# Patient Record
Sex: Male | Born: 1940 | Race: White | Hispanic: No | Marital: Married | Smoking: Former smoker
Health system: Southern US, Community
[De-identification: ages and names within clinical notes are randomized; demographics above are authoritative.]

## PROBLEM LIST (undated history)

## (undated) DIAGNOSIS — T8203XA Leakage of heart valve prosthesis, initial encounter: Secondary | ICD-10-CM

## (undated) DIAGNOSIS — Z992 Dependence on renal dialysis: Secondary | ICD-10-CM

## (undated) DIAGNOSIS — Z952 Presence of prosthetic heart valve: Secondary | ICD-10-CM

## (undated) DIAGNOSIS — R001 Bradycardia, unspecified: Secondary | ICD-10-CM

## (undated) DIAGNOSIS — I4729 Other ventricular tachycardia: Secondary | ICD-10-CM

## (undated) DIAGNOSIS — I5189 Other ill-defined heart diseases: Secondary | ICD-10-CM

## (undated) DIAGNOSIS — N186 End stage renal disease: Secondary | ICD-10-CM

## (undated) DIAGNOSIS — I499 Cardiac arrhythmia, unspecified: Secondary | ICD-10-CM

## (undated) DIAGNOSIS — K08109 Complete loss of teeth, unspecified cause, unspecified class: Secondary | ICD-10-CM

## (undated) DIAGNOSIS — I10 Essential (primary) hypertension: Secondary | ICD-10-CM

## (undated) DIAGNOSIS — I472 Ventricular tachycardia: Secondary | ICD-10-CM

## (undated) DIAGNOSIS — I2510 Atherosclerotic heart disease of native coronary artery without angina pectoris: Secondary | ICD-10-CM

## (undated) DIAGNOSIS — R011 Cardiac murmur, unspecified: Secondary | ICD-10-CM

## (undated) DIAGNOSIS — R233 Spontaneous ecchymoses: Secondary | ICD-10-CM

## (undated) DIAGNOSIS — J189 Pneumonia, unspecified organism: Secondary | ICD-10-CM

## (undated) DIAGNOSIS — I509 Heart failure, unspecified: Secondary | ICD-10-CM

## (undated) DIAGNOSIS — K529 Noninfective gastroenteritis and colitis, unspecified: Secondary | ICD-10-CM

## (undated) DIAGNOSIS — Z974 Presence of external hearing-aid: Secondary | ICD-10-CM

## (undated) DIAGNOSIS — N185 Chronic kidney disease, stage 5: Secondary | ICD-10-CM

## (undated) DIAGNOSIS — IMO0001 Reserved for inherently not codable concepts without codable children: Secondary | ICD-10-CM

## (undated) DIAGNOSIS — I770 Arteriovenous fistula, acquired: Secondary | ICD-10-CM

## (undated) DIAGNOSIS — C679 Malignant neoplasm of bladder, unspecified: Secondary | ICD-10-CM

## (undated) DIAGNOSIS — I350 Nonrheumatic aortic (valve) stenosis: Secondary | ICD-10-CM

## (undated) DIAGNOSIS — N2589 Other disorders resulting from impaired renal tubular function: Secondary | ICD-10-CM

## (undated) DIAGNOSIS — M1990 Unspecified osteoarthritis, unspecified site: Secondary | ICD-10-CM

## (undated) DIAGNOSIS — Z789 Other specified health status: Secondary | ICD-10-CM

## (undated) DIAGNOSIS — I951 Orthostatic hypotension: Secondary | ICD-10-CM

## (undated) DIAGNOSIS — I739 Peripheral vascular disease, unspecified: Secondary | ICD-10-CM

## (undated) DIAGNOSIS — Z972 Presence of dental prosthetic device (complete) (partial): Secondary | ICD-10-CM

## (undated) DIAGNOSIS — I4819 Other persistent atrial fibrillation: Secondary | ICD-10-CM

## (undated) DIAGNOSIS — Z7901 Long term (current) use of anticoagulants: Secondary | ICD-10-CM

## (undated) DIAGNOSIS — R238 Other skin changes: Secondary | ICD-10-CM

## (undated) DIAGNOSIS — I429 Cardiomyopathy, unspecified: Secondary | ICD-10-CM

## (undated) HISTORY — DX: Bradycardia, unspecified: R00.1

## (undated) HISTORY — DX: Essential (primary) hypertension: I10

## (undated) HISTORY — PX: VASCULAR SURGERY: SHX849

## (undated) HISTORY — PX: OTHER SURGICAL HISTORY: SHX169

## (undated) HISTORY — DX: Arteriovenous fistula, acquired: I77.0

## (undated) HISTORY — PX: COLONOSCOPY: SHX174

## (undated) HISTORY — PX: MULTIPLE TOOTH EXTRACTIONS: SHX2053

## (undated) HISTORY — DX: Hypomagnesemia: E83.42

## (undated) HISTORY — DX: Orthostatic hypotension: I95.1

## (undated) HISTORY — DX: Peripheral vascular disease, unspecified: I73.9

## (undated) HISTORY — DX: Other ventricular tachycardia: I47.29

## (undated) HISTORY — DX: Malignant neoplasm of bladder, unspecified: C67.9

## (undated) HISTORY — DX: Leakage of heart valve prosthesis, initial encounter: T82.03XA

## (undated) HISTORY — DX: Other ill-defined heart diseases: I51.89

## (undated) HISTORY — DX: Long term (current) use of anticoagulants: Z79.01

## (undated) HISTORY — PX: CATARACT EXTRACTION W/ INTRAOCULAR LENS  IMPLANT, BILATERAL: SHX1307

## (undated) HISTORY — DX: Ventricular tachycardia: I47.2

## (undated) HISTORY — DX: Chronic kidney disease, stage 5: N18.5

## (undated) HISTORY — PX: CHOLECYSTECTOMY: SHX55

## (undated) HISTORY — DX: Cardiomyopathy, unspecified: I42.9

## (undated) HISTORY — DX: Other disorders resulting from impaired renal tubular function: N25.89

## (undated) HISTORY — DX: Nonrheumatic aortic (valve) stenosis: I35.0

## (undated) HISTORY — PX: EYE SURGERY: SHX253

---

## 1997-12-22 ENCOUNTER — Other Ambulatory Visit: Admission: RE | Admit: 1997-12-22 | Discharge: 1997-12-22 | Payer: Self-pay | Admitting: Urology

## 1999-02-21 ENCOUNTER — Ambulatory Visit (HOSPITAL_COMMUNITY): Admission: RE | Admit: 1999-02-21 | Discharge: 1999-02-21 | Payer: Self-pay | Admitting: Urology

## 1999-05-27 ENCOUNTER — Encounter (INDEPENDENT_AMBULATORY_CARE_PROVIDER_SITE_OTHER): Payer: Self-pay | Admitting: *Deleted

## 1999-05-27 ENCOUNTER — Ambulatory Visit (HOSPITAL_BASED_OUTPATIENT_CLINIC_OR_DEPARTMENT_OTHER): Admission: RE | Admit: 1999-05-27 | Discharge: 1999-05-27 | Payer: Self-pay | Admitting: Urology

## 1999-11-01 ENCOUNTER — Encounter: Payer: Self-pay | Admitting: Urology

## 1999-11-01 ENCOUNTER — Encounter: Admission: RE | Admit: 1999-11-01 | Discharge: 1999-11-01 | Payer: Self-pay | Admitting: Urology

## 1999-11-25 ENCOUNTER — Ambulatory Visit (HOSPITAL_BASED_OUTPATIENT_CLINIC_OR_DEPARTMENT_OTHER): Admission: RE | Admit: 1999-11-25 | Discharge: 1999-11-25 | Payer: Self-pay | Admitting: Urology

## 1999-11-25 ENCOUNTER — Encounter (INDEPENDENT_AMBULATORY_CARE_PROVIDER_SITE_OTHER): Payer: Self-pay | Admitting: Specialist

## 2000-04-26 ENCOUNTER — Encounter: Payer: Self-pay | Admitting: Urology

## 2000-04-26 ENCOUNTER — Encounter: Admission: RE | Admit: 2000-04-26 | Discharge: 2000-04-26 | Payer: Self-pay | Admitting: Urology

## 2000-05-01 HISTORY — PX: BLADDER SURGERY: SHX569

## 2000-05-01 HISTORY — PX: CYSTECTOMY: SUR359

## 2001-03-01 HISTORY — PX: COLON SURGERY: SHX602

## 2003-04-23 ENCOUNTER — Ambulatory Visit (HOSPITAL_COMMUNITY): Admission: RE | Admit: 2003-04-23 | Discharge: 2003-04-23 | Payer: Self-pay | Admitting: Cardiology

## 2005-04-20 ENCOUNTER — Encounter: Admission: RE | Admit: 2005-04-20 | Discharge: 2005-04-20 | Payer: Self-pay | Admitting: Cardiology

## 2005-05-11 ENCOUNTER — Encounter: Admission: RE | Admit: 2005-05-11 | Discharge: 2005-05-11 | Payer: Self-pay | Admitting: Cardiology

## 2005-05-22 ENCOUNTER — Ambulatory Visit (HOSPITAL_COMMUNITY): Admission: RE | Admit: 2005-05-22 | Discharge: 2005-05-22 | Payer: Self-pay | Admitting: Cardiology

## 2005-10-05 ENCOUNTER — Encounter: Admission: RE | Admit: 2005-10-05 | Discharge: 2005-10-05 | Payer: Self-pay | Admitting: Family Medicine

## 2005-10-06 ENCOUNTER — Inpatient Hospital Stay (HOSPITAL_COMMUNITY): Admission: EM | Admit: 2005-10-06 | Discharge: 2005-10-12 | Payer: Self-pay | Admitting: Emergency Medicine

## 2005-10-07 ENCOUNTER — Encounter (INDEPENDENT_AMBULATORY_CARE_PROVIDER_SITE_OTHER): Payer: Self-pay | Admitting: *Deleted

## 2005-10-09 ENCOUNTER — Encounter (INDEPENDENT_AMBULATORY_CARE_PROVIDER_SITE_OTHER): Payer: Self-pay | Admitting: Specialist

## 2005-10-09 ENCOUNTER — Ambulatory Visit: Payer: Self-pay | Admitting: Internal Medicine

## 2005-10-19 ENCOUNTER — Ambulatory Visit (HOSPITAL_COMMUNITY): Admission: RE | Admit: 2005-10-19 | Discharge: 2005-10-19 | Payer: Self-pay | Admitting: Gastroenterology

## 2005-11-22 ENCOUNTER — Ambulatory Visit: Payer: Self-pay | Admitting: Gastroenterology

## 2005-11-30 ENCOUNTER — Ambulatory Visit (HOSPITAL_COMMUNITY): Admission: RE | Admit: 2005-11-30 | Discharge: 2005-11-30 | Payer: Self-pay | Admitting: Gastroenterology

## 2005-12-04 ENCOUNTER — Ambulatory Visit: Payer: Self-pay | Admitting: Gastroenterology

## 2006-05-24 ENCOUNTER — Ambulatory Visit (HOSPITAL_COMMUNITY): Admission: RE | Admit: 2006-05-24 | Discharge: 2006-05-24 | Payer: Self-pay | Admitting: Cardiology

## 2006-07-02 ENCOUNTER — Ambulatory Visit: Payer: Self-pay | Admitting: Internal Medicine

## 2006-07-02 ENCOUNTER — Inpatient Hospital Stay (HOSPITAL_COMMUNITY): Admission: AD | Admit: 2006-07-02 | Discharge: 2006-07-06 | Payer: Self-pay | Admitting: Cardiology

## 2006-09-17 ENCOUNTER — Ambulatory Visit (HOSPITAL_COMMUNITY): Admission: RE | Admit: 2006-09-17 | Discharge: 2006-09-17 | Payer: Self-pay | Admitting: Cardiology

## 2006-09-17 ENCOUNTER — Encounter (INDEPENDENT_AMBULATORY_CARE_PROVIDER_SITE_OTHER): Payer: Self-pay | Admitting: Cardiology

## 2006-10-08 ENCOUNTER — Inpatient Hospital Stay (HOSPITAL_COMMUNITY): Admission: AD | Admit: 2006-10-08 | Discharge: 2006-10-10 | Payer: Self-pay | Admitting: Cardiology

## 2006-10-26 ENCOUNTER — Ambulatory Visit (HOSPITAL_COMMUNITY): Admission: RE | Admit: 2006-10-26 | Discharge: 2006-10-26 | Payer: Self-pay | Admitting: Cardiology

## 2006-11-19 ENCOUNTER — Ambulatory Visit (HOSPITAL_COMMUNITY): Admission: RE | Admit: 2006-11-19 | Discharge: 2006-11-19 | Payer: Self-pay | Admitting: Cardiology

## 2006-12-12 ENCOUNTER — Inpatient Hospital Stay (HOSPITAL_COMMUNITY): Admission: AD | Admit: 2006-12-12 | Discharge: 2006-12-14 | Payer: Self-pay | Admitting: Cardiology

## 2007-03-22 ENCOUNTER — Ambulatory Visit: Payer: Self-pay | Admitting: Vascular Surgery

## 2007-03-22 ENCOUNTER — Ambulatory Visit (HOSPITAL_COMMUNITY): Admission: RE | Admit: 2007-03-22 | Discharge: 2007-03-22 | Payer: Self-pay | Admitting: Cardiology

## 2007-05-07 ENCOUNTER — Encounter: Payer: Self-pay | Admitting: Critical Care Medicine

## 2008-01-02 ENCOUNTER — Encounter: Payer: Self-pay | Admitting: Critical Care Medicine

## 2008-01-13 ENCOUNTER — Ambulatory Visit (HOSPITAL_COMMUNITY): Admission: RE | Admit: 2008-01-13 | Discharge: 2008-01-13 | Payer: Self-pay | Admitting: Cardiology

## 2008-02-04 DIAGNOSIS — I4891 Unspecified atrial fibrillation: Secondary | ICD-10-CM

## 2008-02-04 DIAGNOSIS — I421 Obstructive hypertrophic cardiomyopathy: Secondary | ICD-10-CM

## 2008-02-04 DIAGNOSIS — I10 Essential (primary) hypertension: Secondary | ICD-10-CM | POA: Insufficient documentation

## 2008-02-04 DIAGNOSIS — C679 Malignant neoplasm of bladder, unspecified: Secondary | ICD-10-CM | POA: Insufficient documentation

## 2008-02-05 ENCOUNTER — Ambulatory Visit (HOSPITAL_BASED_OUTPATIENT_CLINIC_OR_DEPARTMENT_OTHER): Admission: RE | Admit: 2008-02-05 | Discharge: 2008-02-05 | Payer: Self-pay | Admitting: Critical Care Medicine

## 2008-02-05 ENCOUNTER — Ambulatory Visit: Payer: Self-pay | Admitting: Critical Care Medicine

## 2008-02-05 DIAGNOSIS — R942 Abnormal results of pulmonary function studies: Secondary | ICD-10-CM

## 2009-02-04 ENCOUNTER — Ambulatory Visit (HOSPITAL_COMMUNITY): Admission: RE | Admit: 2009-02-04 | Discharge: 2009-02-04 | Payer: Self-pay | Admitting: Cardiology

## 2009-08-18 ENCOUNTER — Ambulatory Visit (HOSPITAL_COMMUNITY)
Admission: RE | Admit: 2009-08-18 | Discharge: 2009-08-18 | Payer: Self-pay | Source: Home / Self Care | Admitting: Cardiology

## 2010-05-21 ENCOUNTER — Encounter: Payer: Self-pay | Admitting: Cardiology

## 2010-05-22 ENCOUNTER — Encounter: Payer: Self-pay | Admitting: Gastroenterology

## 2010-09-13 NOTE — Discharge Summary (Signed)
NAME:  LEELAND, LOVELADY NO.:  [id number]   MEDICAL RECORD NO.:  [id number]          PATIENT TYPE:  INP   LOCATION:  3729                         FACILITY:  [facility]   PHYSICIAN:  Guy Franco, P.A.       DATE OF BIRTH:  09-08-1940   DATE OF ADMISSION:  12/12/2006  DATE OF DISCHARGE:  12/14/2006                               DISCHARGE SUMMARY   DISCHARGE DIAGNOSES:  1. Atrial fibrillation, amiodarone load.  2. Long-term Coumadin use with therapeutic INR.  3. History of bladder cancer.  4. Renal insufficiency.  5. Hypertension.  6. Left ventricular hypertrophy.   HOSPITAL COURSE:  Mr. Watchman is a 70 year old male patient who was  admitted to [facility] for amiodarone load.  He had previously been  sotalol, but this was not keeping him in normal sinus rhythm.  He was  admitted for amiodarone load on December 12, 2006.  QTC on admission was  529 milliseconds, and QTC by discharge was essentially unchanged.   During his hospitalization, PFTs were performed and showed minimal  obstructive airway disease of the peripheral airways.  After a  bronchodilator administration, there was no significant response.  The  reduced diffusing opacity indicated a moderate loss of function of  alveolar capillary surface.   DISCHARGE MEDICATIONS:  The patient was discharged to home in stable and  improved condition on the following medications:  1. Amiodarone 400 mg p.o. b.i.d. for 1 week, then 400 mg daily for 1      week, then 200 mg daily thereafter.  2. Vitamins daily.  3. Coumadin 5 mg 1 tablet daily.  4. Norvasc 10 mg a day.   DIET:  Renal, low-fat, low-sodium, heart healthy diet.   ACTIVITY:  Increase activity slowly.   FOLLOWUP:  Follow up in Dr. Norris Cross office for an EKG and lab work on  December 18, 2006 at 10:30 a.m., and then to see her on December 25, 2006 at  12:45 p.m.   LABORATORY EVALUATION:  Please note that the patient's labs during the  hospital stay include:   White count of 7.2, hemoglobin 14.2, hematocrit  of 41.6, platelets 244.  Sodium 138, potassium 4.2, BUN 35, creatinine  1.75.  TSH 0.764.  Protime 22.5, INR of 1.9.      Guy Franco, P.A.     LB/MEDQ  D:  12/14/2006  T:  12/14/2006  Job:  [id number]   cc:   Armanda Magic, M.D.  Quita Skye Artis Flock, M.D.

## 2010-09-13 NOTE — H&P (Signed)
NAME:  [name], [name]                ACCOUNT NO.:  [id number]   MEDICAL RECORD NO.:  [id number]          PATIENT TYPE:  AMB   LOCATION:  ENDO                         FACILITY:  [facility]   PHYSICIAN:  Armanda Magic, M.D.     DATE OF BIRTH:  1940-08-04   DATE OF ADMISSION:  09/17/2006  DATE OF DISCHARGE:                              HISTORY & PHYSICAL   REASON FOR ADMISSION:  TEE.   HISTORY OF PRESENT ILLNESS:  [name] comes in today for a TEE.  Recent atrial fibrillation resulted in a TEE that showed a probable left  atrial thrombus.  He has been on systemic anticoagulation, followed at  our Coumadin clinic and has been therapeutic for at least seven weeks on  Coumadin.  He is extremely symptomatic with his underlying atrial  fibrillation resulting in fatigue.  He is scheduled for repeat TEE today  to evaluate the resolution of the left atrial thrombus prior to  cardioversion/sotalol load.   PAST MEDICAL HISTORY:  AFib; systemic anticoagulation; exertional  fatigue, secondary to underlying AFib; mild aortic stenosis;  hypertension; mild renal insufficiency with no evidence of renal artery  stenosis by MRI/MRA; gallstones, status post cholecystectomy; history of  bladder cancer, status post bladder resection; history of NSVT with AV  consult on July 03, 2006, seen by Dr. Lewayne Bunting with recommendations  of sotalol as well as ultimate AICD; recent adenosine Cardiolite with no  evidence of inducible ischemia.   MEDICATIONS:  Current medications are:  1. Multivitamin daily.  2. Vitamin B daily.  3. Vitamin D daily.  4. Vitamin E daily.  5. Metoprolol 25 mg b.i.d.  6. Coumadin.  7. Glucosamine 1500 mg a day.  8. Norvasc 5 mg a day.   FAMILY HISTORY:  His father had sudden death at age 30 and had had a  pacemaker in place.  Mother died of a stroke.   SOCIAL HISTORY:  He is a Technical sales engineer.  Denies any tobacco,  alcohol or illicit drug use.   ALLERGIES:  ASPIRIN CAUSES  GI BLEED; IBUPROFEN WITH GI BLEED.   PHYSICAL EXAMINATION:  VITAL SIGNS:  Pulse of 96.  O2 saturation is 94%  on room air.  Respirations 16.  Blood pressure 137/878.  HEENT:  Grossly normal.  NECK:  No carotid bruits.  RESPIRATORY:  Clear to auscultation bilaterally.  CARDIOVASCULAR:  Heart irregular, no murmurs, rubs or gallops.  ABDOMEN:  Obese, but nontender.  EXTREMITIES:  No peripheral edema.   ASSESSMENT/PLAN:  1. Atrial fibrillation:  Now for transesophageal echocardiogram to      evaluate left atrial appendage.  Patient will need sotalol load.      There is a question of automatic implantable cardioverter      defibrillator in the near future, secondary to probable      hypertrophic cardiomyopathy with father having a history of sudden      death.  2. Coumadin therapy.  3. Mild aortic stenosis.  4. Fatigue, secondary to atrial fibrillation.  5. Hypertension.  6. Mild renal insufficiency with no evidence of renal artery stenosis  by MRI/MRA.  7. History of bladder cancer, status post resection.  8. History of non-sustained ventricular tachycardia with an      electrophysiological evaluation as mentioned above.  9. Adenosine Cardiolite with no ischemia.  10.Long-term medication use.   The patient was seen and examined by Dr. Mayford Knife.  The patient will have  a TEE today to evaluate for left atrial clot and once that has been  determined as negative, we will go ahead and start the sotalol and  consider the defibrillator implantation.      Guy Franco, P.A.      Armanda Magic, M.D.  Electronically Signed    LB/MEDQ  D:  09/17/2006  T:  09/17/2006  Job:  [id number]

## 2010-09-13 NOTE — Op Note (Signed)
NAME:  CARMERON, HEADY NO.:  [id number]   MEDICAL RECORD NO.:  [id number]          PATIENT TYPE:  OIB   LOCATION:  2899                         FACILITY:  [facility]   PHYSICIAN:  Armanda Magic, M.D.     DATE OF BIRTH:  09-10-1940   DATE OF PROCEDURE:  03/22/2007  DATE OF DISCHARGE:                               OPERATIVE REPORT   REFERRING PHYSICIAN:  Quita Skye. Artis Flock, M.D.   PROCEDURE:  Direct current cardioversion.   SURGEON:  Armanda Magic, M.D.   INDICATIONS:  Atrial fibrillation.   COMPLICATIONS:  None.   IV MEDICATION:  200 mg of Pentothal IV.   This is a 70 year old male with a history of atrial fibrillation now  status post amiodarone load who now presents for cardioversion.   The patient was brought to the day hospital in the fasting nonsedated  state.  Informed consent was obtained.  The patient was connected to  continuous heart rate and pulse oximetry monitoring and intermittent  blood pressure monitor.  Defibrillator pads were placed on the anterior  left chest and posterior left back.  After adequate anesthesia was  obtained, a 150-joule synchronized biphasic shock was delivered which  successfully converted the patient to sinus rhythm.   The patient tolerated the procedure well, and after he was fully awake  and recovered, he was discharged to home.   ASSESSMENT:  1. Atrial fibrillation on amiodarone with a stable QTC.  2. Systemic anticoagulation with therapeutic INR.  3. Successful cardioversion to normal sinus rhythm.   PLAN:  Continue current medications.  Discharge to home when fully  awake.  Follow up with me in 2 weeks.      Armanda Magic, M.D.  Electronically Signed    TT/MEDQ  D:  03/22/2007  T:  03/22/2007  Job:  [id number]   cc:   Quita Skye. Artis Flock, M.D.

## 2010-09-13 NOTE — Op Note (Signed)
NAME:  [name], [name]                ACCOUNT NO.:  [id number]   MEDICAL RECORD NO.:  [id number]          PATIENT TYPE:  OIB   LOCATION:  2854                         FACILITY:  [facility]   PHYSICIAN:  Armanda Magic, M.D.     DATE OF BIRTH:  May 07, 1940   DATE OF PROCEDURE:  11/19/2006  DATE OF DISCHARGE:                               OPERATIVE REPORT   REFERRING PHYSICIAN:  Quita Skye. Artis Flock, M.D.   PROCEDURE:  Direct current cardioversion.   OPERATOR:  Armanda Magic, M.D.   INDICATIONS:  Atrial fibrillation currently on sotalol status post  cardioversion and sinus rhythm, now reverted back to atrial  fibrillation.   COMPLICATIONS:  None.   INTRAVENOUS MEDICINES:  Pentothal 150 mg IV.   This is a very pleasant 70 year old white male with a history of  hypertrophic obstructive cardiomyopathy, mild aortic stenosis,  paroxysmal atrial fibrillation now status post sotalol load.  He had  eventually converted by cardioversion to sinus rhythm but now referred  back to atrial fibrillation.  He now presents for recurrent  cardioversion.   The patient is brought to the day hospital in the fasting nonsedated  state.  Informed consent was obtained.  The patient was connected to  continuous heart rate and pulse oximetry monitoring and intermittent  blood pressure monitor.  Defibrillator pads were placed on the anterior  and posterior left chest.  After adequate anesthesia was obtained, a 100-  joule biphasic synchronized shock was delivered which was unsuccessful  in converting the patient to sinus rhythm.  A second biphasic 100-joule  synchronized shock was delivered which successfully converted the  patient to sinus bradycardia.  The patient tolerated the procedure well  without complications.  Once the patient was fully awake, he was  discharged to home.   ASSESSMENT:  1. Atrial fibrillation status post initial loading with sotalol, then      status post cardioversion, now back to atrial  fibrillation.  2. Systemic anticoagulation with therapeutic INR.  3. Successful cardioversion to normal sinus rhythm, sinus brady.   PLAN:  Discharge to home after fully awake.  Follow up with me in 2-3  weeks.      Armanda Magic, M.D.  Electronically Signed     TT/MEDQ  D:  11/19/2006  T:  11/19/2006  Job:  [id number]   cc:   Quita Skye. Artis Flock, M.D.

## 2010-09-13 NOTE — Op Note (Signed)
NAME:  [name], [name] NO.:  [id number]   MEDICAL RECORD NO.:  [id number]          PATIENT TYPE:  OIB   LOCATION:  2899                         FACILITY:  [facility]   PHYSICIAN:  Armanda Magic, M.D.     DATE OF BIRTH:  [birth year]   DATE OF PROCEDURE:  10/26/2006  DATE OF DISCHARGE:                               OPERATIVE REPORT   PROCEDURE:  Direct current cardioversion.   OPERATOR:  Armanda Magic, M.D.   INDICATIONS:  Atrial fibrillation.   COMPLICATIONS:  None.   MEDICATIONS:  130 mg IV Diprivan.   This is a 70 year old male who has a history of atrial fibrillation and  mild hypertrophic obstructive cardiomyopathy, who now presents for  cardioversion status post loading with sotalol.  He has had a  therapeutic INR for greater than 4 weeks and actually underwent a  transesophageal echo a few weeks ago, which showed no evidence of a  clot.  He now presents for cardioversion.   After adequate anesthesia was obtained, a 100-joule synchronized  biphasic shock was delivered. which was unsuccessful in converting the  patient to sinus rhythm.  A 150-joule synchronized biphasic shock was  then delivered, which successfully converted the patient to sinus  rhythm.  The patient tolerated the procedure well.  After the patient  was fully awake, the patient was discharged to home.   ASSESSMENT:  1. Atrial fibrillation status post sotalol load.  2. Successful cardioversion to normal sinus rhythm.  3. Systemic anticoagulation with therapeutic INR.   PLAN:  Discharge to home when fully awake.  EKG check in 1 week.  Follow  up with me in 2 weeks.      Armanda Magic, M.D.  Electronically Signed     TT/MEDQ  D:  10/26/2006  T:  10/26/2006  Job:  [id number]   cc:   Quita Skye. Artis Flock, M.D.

## 2010-09-13 NOTE — Discharge Summary (Signed)
NAME:  [name], [name]                ACCOUNT NO.:  [id number]   MEDICAL RECORD NO.:  [id number]          PATIENT TYPE:  INP   LOCATION:  3736                         FACILITY:  [facility]   PHYSICIAN:  Armanda Magic, M.D.     DATE OF BIRTH:  Dec 24, 1940   DATE OF ADMISSION:  10/08/2006  DATE OF DISCHARGE:  10/10/2006                               DISCHARGE SUMMARY   ADDENDUM   FOLLOWUP APPOINTMENTS:  1. [facility]iology Coumadin Clinic appointment for PT and INR check      on June 13 at 8:45 a.m. followed by an EKG also at [facility]      to check QTc on June 13 at 9 a.m.  2. EKG at [facility] Cardiology to check QTc on June 18 at 10:30 a.m.  3. Followup appointment with Dr. Carolanne Grumbling on June 24 at 11:30      a.m..  The patient is scheduled to call [phone] if he is unable to      make the scheduled appointments.      [name], [place]      Armanda Magic, M.D.  Electronically Signed    RDM/MEDQ  D:  10/10/2006  T:  10/11/2006  Job:  [id number]

## 2010-09-13 NOTE — H&P (Signed)
NAME:  [name], [name]                ACCOUNT NO.:  [id number]   MEDICAL RECORD NO.:  [id number]          PATIENT TYPE:  OIB   LOCATION:  2854                         FACILITY:  [facility]   PHYSICIAN:  Armanda Magic, M.D.     DATE OF BIRTH:  October 28, 1940   DATE OF ADMISSION:  11/19/2006  DATE OF DISCHARGE:                              HISTORY & PHYSICAL   REASON FOR VISIT:  Cardioversion, atrial fibrillation.   Mr. Bogden is a 70 year old male patient with a history of paroxysmal  atrial fibrillation status post sotalol loading.  He is admitted today  for a cardioversion while on sotalol.   He denies any chest pain, palpitations, PND, orthopnea, dizziness, or  syncope.  No recent flu, chills, or other constitutional symptoms.   PAST MEDICAL HISTORY:  1. PAF.  2. Systemic anticoagulation.  3. Asymptomatic bradycardia.  4. Hypertrophic obstructive cardiomyopathy.  5. Hypertension.  6. Renal insufficiency.  7. Bladder cancer, status post resection.   MEDICATIONS:  Multivitamin daily, vitamin B daily, vitamin D daily,  vitamin E daily, Coumadin, glucosamine, Norvasc 10 mg a day, sotalol 80  mg once a day (for renal __________ ).   FAMILY HISTORY:  His dad had a pacemaker.   SOCIAL HISTORY:  Married.   CURRENT ALLERGIES:  NO KNOWN DRUG ALLERGIES.   PHYSICAL EXAMINATION:  VITAL SIGNS:  Temperature 96.8, pulse 88,  respirations 18, blood pressure 132/87, O2 saturations 94% on room air.  HEENT:  Grossly normal.  No JVD.  Sclerae clear.  Conjunctivae normal.  CHEST:  Clear to auscultation bilaterally.  No wheeze or rhonchi.  HEART:  Irregularly irregular.  No gross murmur.  ABDOMEN:  Obese.  EXTREMITIES:  No peripheral edema.  SKIN:  Warm and dry.   ASSESSMENT/PLAN:  1. Atrial fibrillation, paroxysmal.  He is currently in atrial      fibrillation.  He will go back for cardioversion today.  I have      documentation of his INRs over the past several weeks.  INR on November 09, 2006  was 3.  INR on October 23, 2006 was 2.6.  INR on October 12, 2006 was 2.9.  On October 04, 2006, INR was 3.3.  2. Sotalol medication.  3. Systemic anticoagulation.  4. History of asymptomatic bradycardia.  5. Hypertrophic obstructive cardiomyopathy.  6. Mild hypertension.  7. Renal insufficiency with subsequent sotalol decreased to 80 mg      daily.  8. History of bladder cancer, status post resection.   The patient has been seen and examined.  The patient has been seen by  Dr. Mayford Knife.  Today the patient will have a cardioversion.  Lab work  pending.      Guy Franco, P.A.      Armanda Magic, M.D.  Electronically Signed    LB/MEDQ  D:  11/19/2006  T:  11/19/2006  Job:  [id number]   cc:   Quita Skye. Artis Flock, M.D.  Armanda Magic, M.D.

## 2010-09-13 NOTE — Discharge Summary (Signed)
NAME:  [name], [name]                ACCOUNT NO.:  [id number]   MEDICAL RECORD NO.:  [id number]          PATIENT TYPE:  INP   LOCATION:  3736                         FACILITY:  [facility]   PHYSICIAN:  Armanda Magic, M.D.     DATE OF BIRTH:  [birth year]   DATE OF ADMISSION:  10/08/2006  DATE OF DISCHARGE:  10/10/2006                               DISCHARGE SUMMARY   ADMISSION DIAGNOSIS:  Atrial fibrillation.   DISCHARGE DIAGNOSIS:  1. Paroxysmal atrial fibrillation, rate controlled, status post      sotalol loading with stable.  QTC.  2. Status post DCCV to normal sinus rhythm in February 2008, with      reversion to paroxysmal atrial fibrillation.  3. Occasional shortness of breath.  4. Asymptomatic bradycardia.  5. Hypertrophic obstructive cardiomyopathy.  6. Mild aortic stenosis.  7. Hypertension.  8. Systemic anticoagulation with Coumadin therapy.  9. Mild renal insufficiency without evidence of renal artery stenosis      by MRI/MRA.  10.Gallstones, status post cholecystectomy.  11.Bladder cancer, status post bladder resection.  12.Systemic anticoagulation with Coumadin therapy with a therapeutic      INR.   CONSULTATIONS:  None.   PROCEDURES:  None.   HOSPITAL COURSE:  [name] is a 70 year old Caucasian male with no  known history of coronary artery disease.  He has a history of  paroxysmal atrial fibrillation, status post DCCV in February 2008  converting to sinus rhythm, then reverting back to paroxysmal atrial  fibrillation.  He is on systemic anticoagulation with Coumadin therapy  and was admitted to the [facility] on October 08, 2006 for sotalol  loading at 120 mg twice daily.  Initial EKG upon arrival revealed atrial  fibrillation with a ventricular rate of 89 beats per minute with a QTC  of 457 msec.  There were no acute ischemic changes noted.  The patient  tolerated the drug well and a QTC remained stable during this admission.  Also during this admission  the patient experienced renal insufficiency  with an increased creatinine of 1.6, as a result sotalol was decreased  to 80 mg one tablet daily upon discharge.  A 12-lead EKG prior to  discharge on October 10, 2006 revealed atrial fibrillation with a  ventricular rate of 85 beats per minute with a QTC of 492 msec.  There  were no acute ischemic changes noted on that EKG.  The patient was seen  and examined by Dr. Mayford Knife prior to discharge who was in agreement with  the discharge decision.  The patient's metoprolol was discontinued  during this admission and upon discharge.  He is being discharged to  home in stable condition without complaints of palpitations, chest pain,  dizziness, fatigue, shortness of breath, presyncope, or syncope.   LABORATORY DATA:  White blood count 7, hemoglobin 13.8, hematocrit 40.4,  platelets 234,000.  PT 25.5, INR 2.2.  Sodium 139, potassium 4, chloride  107, CO2 23, glucose 105, BUN 28, creatinine 1.64.   X-rays:  None during this admission.   The 12-lead EKGs as stated in the hospital course.  CONDITION ON DISCHARGE:  Stable.   DISCHARGE MEDICATIONS:  1. Sotalol 80 mg one tablet daily.  Prescriptions were provided for      #30 one tablet by mouth daily with 11 refills, as well as #90 one      tablet by mouth daily with three refills.  2. Norvasc 10 mg daily.  3. Coumadin 2.5 mg daily alternated with 5 mg Tuesdays and Thursdays.  4. Vitamin B complex daily.  5. Vitamin E 400 international units daily.  6. Multivitamin one tablet daily.  7. Glucosamine plus MSM 1500 mg daily.  8. Calcium plus vitamin D daily.   DISCHARGE INSTRUCTIONS:  1. Increase activity slowly.  2. Continue a low-sodium, low-fat, and low cholesterol diet.  3. Stop any activity that causes chest pain, shortness of breath,      dizziness, sweating, or excessive weakness.   FOLLOW-UP APPOINTMENTS:  1. [facility] cardiology Coumadin Clinic on June 13 at 10 a.m.  2. EKG at [facility]  cardiology with a QTC check on June 18 at 10:30 a.m.  3. Follow-up appointment with Dr. Armanda Magic at [facility] Cardiology on      June 24 at 11:30 a.m..  The patient is scheduled to call [phone]      if he is unable to make the scheduled appointment.      [name], [place]      Armanda Magic, M.D.  Electronically Signed    RDM/MEDQ  D:  10/10/2006  T:  10/11/2006  Job:  [id number]   cc:   Armanda Magic, M.D.  Quita Skye Artis Flock, M.D.

## 2010-09-13 NOTE — H&P (Signed)
NAME:  [name], [name]                ACCOUNT NO.:  [id number]   MEDICAL RECORD NO.:  [id number]          PATIENT TYPE:  INP   LOCATION:  3736                         FACILITY:  [facility]   PHYSICIAN:  Armanda Magic, M.D.     DATE OF BIRTH:  1940/07/25   DATE OF ADMISSION:  10/08/2006  DATE OF DISCHARGE:                              HISTORY & PHYSICAL   PRIMARY CARE PHYSICIAN:  Quita Skye. Artis Flock, M.D.   CARDIOLOGIST:  Armanda Magic, M.D.   REASON FOR ADMISSION:  Atrial fibrillation.   HISTORY OF PRESENT ILLNESS:  [name] is a 70 year old Caucasian male  with no known history of coronary artery disease.  He has a history of  paroxysmal atrial fibrillation, status post DCCV in February 2008,  converting to sinus rhythm, then reverting back to paroxysmal atrial  fibrillation.  He is on systemic anticoagulation with Coumadin therapy.  He was admitted to the [facility] in March 2008, for sotalol  loading at 120 mg twice daily, which was later discontinued secondary to  a TEE which revealed a possible left atrial appendage.  Then, he was  started on Norvasc 5 mg daily which was later increased to 10 mg daily.  A repeat TEE revealed no left atrial appendage or thrombus.  He is  currently being directly readmitted for sotalol loading.  On today he admits to occasional palpitations with associated shortness  of breath.  He denies chest pain, dizziness, near syncope, or syncope.   PAST MEDICAL HISTORY:  1. Paroxysmal atrial fibrillation, rate controlled.  2. Status post DCCV to normal sinus rhythm in February 2008, now back      in paroxysmal atrial fibrillation.  3. Occasional shortness of breath.  4. Asymptomatic bradycardia.  5. Hypertrophic obstructive cardiomyopathy.  6. Mild aortic stenosis.  7. Hypertension.  8. Systemic anticoagulation with Coumadin therapy.  9. Mild renal insufficiency without evidence of renal artery stenosis      by MRI/MRA.  10.Gallstones, status post  cholecystectomy.  11.Bladder cancer, status post bladder resection.   ALLERGIES:  1. ASPIRIN.  2. IBUPROFEN.   CURRENT MEDICATIONS:  1. Coumadin as directed.  2. Norvasc 10 mg daily.  3. Multivitamin 1 tablet daily.  4. Glucosamine 1500 mg daily.  5. Vitamin B complex.  6. Calcium plus vitamin D once daily.  7. Vitamin E 400 international units daily.  8. Metoprolol 25 mg twice daily.   FAMILY HISTORY:  Father deceased age 35, status post permanent  pacemaker.  Mother deceased, CVA.   SOCIAL HISTORY:  Married with adult children.  Lives with his wife.  Self-employed as a Technical sales engineer.  He denies tobacco, alcohol,  or illicit drug use.   REVIEW OF SYSTEMS:  All other systems reviewed are negative other than  what is stated in the HPI.   PHYSICAL EXAMINATION:  GENERAL:  A 70 year old male, pleasant and  cooperative, NAD.  VITAL SIGNS:  Blood pressure 126/83, pulse 89, temperature 97.6, O2  saturation is 95% over room air, respirations 20.  HEENT:  Unremarkable.  NECK:  Supple without JVD or bilateral  carotid bruits.  Carotid  upstrokes 2+.  PULMONARY:  Breath sounds are equal and clear to auscultation  bilaterally.  No use of accessory muscles.  CV:  Irregularly irregular.  Normal S1-S2 with a 1/6 systolic murmur.  ABDOMEN:  Benign.  EXTREMITIES:  No peripheral edema, cyanosis, or clubbing.  DP pulses  2+/2, bilaterally.  SKIN:  Warm and dry without rashes or lesions.  NEUROLOGIC:  No focal motor or sensory deficits.  PSYCHIATRIC:  Normal mood and affect.   LABORATORY DATA:  Pending.   ASSESSMENT:  1. Paroxysmal atrial fibrillation, rate controlled.  2. Status post DCCV to normal sinus rhythm in February 2008, now back      in paroxysmal atrial fibrillation.  3. Recent TEE without left atrial appendage or thrombus.  4. Hypertrophic obstructive cardiomyopathy.  5. History of asymptomatic bradycardia.  6. Hypertension, controlled.  7. Otherwise as stated in  the past medical history.   PLAN:  1. Start sotalol 120 mg twice daily.  Hold for a pulse greater than or      equal to 3.0 seconds, a QTC greater than or equal to 550      milliseconds, and a heart rate less than 50.  2. Stat EKG.  3. Hold metoprolol 25 mg twice daily.  4. Coumadin 2.5 mg daily, except for 5 mg on Tuesdays and Thursdays,      otherwise Coumadin management and PT and INR check per pharmacy      protocol.  5. Continue Norvasc 10 mg daily as well as multivitamin 1 tablet      daily.  6. Daily EKG.  7. B-MET, CBC, PT, INR, PTT.  8. The patient was seen and examined by Dr. Mayford Knife who participated in      the medical decision making and plan of care.      [name], [place]      Armanda Magic, M.D.  Electronically Signed    RDM/MEDQ  D:  10/08/2006  T:  10/08/2006  Job:  [id number]   cc:   Quita Skye. Artis Flock, M.D.

## 2010-09-16 NOTE — H&P (Signed)
NAME:  [name], [name] NO.:  [id number]   MEDICAL RECORD NO.:  [id number]          PATIENT TYPE:  INP   LOCATION:  2032                         FACILITY:  [facility]   PHYSICIAN:  Armanda Magic, M.D.     DATE OF BIRTH:  1940/12/13   DATE OF ADMISSION:  07/02/2006  DATE OF DISCHARGE:                              HISTORY & PHYSICAL   PRIMARY CARE PHYSICIAN:  Quita Skye. Artis Flock, M.D.   CARDIOLOGIST:  Armanda Magic, M.D.   REASON FOR ADMISSION:  Atrial fibrillation.   HISTORY OF PRESENT ILLNESS:  Mr. Mierzwa is a 70 year old white male  with no known history of coronary artery disease.  He has a history of  paroxysmal atrial fibrillation and is status post DCCV with conversion  to a normal sinus rhythm approximately 3-4 weeks ago, shortly after  reverting back to atrial fibrillation.  He is rate controlled on  metoprolol twice daily.  He is also on systemic anticoagulation with  Coumadin therapy secondary to paroxysmal atrial fibrillation.  He was  admitted yesterday to the [facility] for sotalol loading.  He  admits to frequent palpitations, occasional shortness of breath, and  fatigue, however denies chest pain, dizziness, presyncope, or syncope.   PAST MEDICAL HISTORY:  1. Paroxysmal atrial fibrillation, rate controlled.  2. Status post DCCV to normal sinus rhythm in February 2008, now back      in atrial fibrillation.  3. Occasional shortness of breath.  4. Asymptomatic bradycardia.  5. Hypertrophic obstructive cardiomyopathy.  6. Mild aortic stenosis.  7. Hypertension.  8. Systemic anticoagulation with Coumadin therapy.  9. Mild renal insufficiency, without evidence of renal artery stenosis      by MRI/MRA.  10.Gallstones, status post cholecystectomy.  11.Bladder cancer, status post bladder resection.   ALLERGIES:  Intolerant to ASPIRIN and IBUPROFEN.   CURRENT MEDICATIONS:  1. Metoprolol 25 mg twice daily.  2. Coumadin as directed.   FAMILY  HISTORY:  Father deceased, age 63, status post permanent  pacemaker.  Mother deceased, CVA.   SOCIAL HISTORY:  Married, with adult children.  Lives with wife.  He is  self-employed as a Technical sales engineer.  He denies tobacco, alcohol,  or illicit drug use.   REVIEW OF SYSTEMS:  All other systems are negative, other than what is  stated in the HPI.   PHYSICAL EXAMINATION:  GENERAL:  A 70 year old male, pleasant and  cooperative, NAD.  VITAL SIGNS:  Blood pressure 154/94, pulse 72 and regular, respirations  20, temperature 97.4, O2 saturations 97% on room air.  HEENT:  Benign.  NECK:  Supple, without JVD or bilateral carotid bruits.  Carotid  upstrokes 2+.  PULMONARY:  Breath sounds are equal and clear to auscultation  bilaterally.  No use of accessory muscles.  CARDIOVASCULAR:  Irregularly irregular.  ABDOMEN:  Benign.  EXTREMITIES:  No peripheral edema.  DP pulses 2+/2 bilaterally.  SKIN:  Warm and dry, without rashes or lesions.  NEUROLOGIC:  No focal motor or sensory deficits.  PSYCHIATRIC:  Normal mood and affect.   LABORATORY DATA:  A 12-lead EKG, July 02, 2006:  Atrial fibrillation at  70 beats per minute, with an incomplete right bundle branch block, with  nonspecific T wave abnormalities, with T wave inversion in leads V3-V6.  There is no evidence of ischemia.  QTc 432 msec.   ASSESSMENT:  1. Paroxysmal atrial fibrillation, rate controlled.  2. Status post direct current cardioversion to normal sinus rhythm,      February 2008, now back in atrial fibrillation.  3. Hypertension.  4. Systemic anticoagulation with Coumadin therapy.  5. Hypertrophic obstructive cardiomyopathy.  6. Occasional shortness of breath.  7. Mild renal insufficiency.  8. Asymptomatic bradycardia.  9. Otherwise as stated in the past medical history.   PLAN:  1,  Begin sotalol loading per orders specified by Dr. Armanda Magic.  1. Continue home medications.  2. Please notify M.D. for heart  rate less than 50 or pauses greater      than or equal to 3 seconds.  3. Avoid caffeine.  4. Daily EKG to monitor QTc.  5. Check CBC, BMET, PT, INR, PTT, and TSH.  6. Further measures per Dr. Armanda Magic.  7. Dr. Mayford Knife to see, interview, and examine the patient.     [name], [place]      Armanda Magic, M.D.  Electronically Signed   RDM/MEDQ  D:  07/03/2006  T:  07/03/2006  Job:  [id number]   cc:   Quita Skye. Artis Flock, M.D.

## 2010-09-16 NOTE — Op Note (Signed)
NAME:  [name], [name] NO.:  [id number]   MEDICAL RECORD NO.:  [id number]          PATIENT TYPE:  INP   LOCATION:  5508                         FACILITY:  [facility]   PHYSICIAN:  Anselm Pancoast. Weatherly, M.D.DATE OF BIRTH:  1940-07-19   DATE OF PROCEDURE:  10/09/2005  DATE OF DISCHARGE:                                 OPERATIVE REPORT   PREOPERATIVE DIAGNOSES:  1.  Subacute cholecystitis.  2.  Recent past history of common duct stone, status post endoscopic      retrograde cholangiopancreatography and stent placement.   POSTOPERATIVE DIAGNOSES:  1.  Subacute cholecystitis with stones.  2.  Common duct stone.   PROCEDURE:  Laparoscopic cholecystectomy with cholangiogram.   ANESTHESIA:  General anesthesia.   SURGEON:  Anselm Pancoast. Zachery Dakins, M.D.   ASSISTANT:  Leonie Man, M.D.   INDICATIONS FOR PROCEDURE:  [name] is a 70 year old male who was  admitted by the [facility] people with jaundice over the weekend.  He has a  past history of significance and he has had a previous cystectomy for cancer  of the bladder.  He has a re-created bladder that he catheterizes himself  about every three to four hours and he had intermittent episodes of pain.  He did become jaundiced.  He was admitted by the [facility] people.  Dr.  Jordan Hawks. Elnoria Howard saw him from GI and did an ERCP.  Did not find a common duct  stone, but a stricture in the distal common bile duct and put a stent.  There were stones in his gallbladder.  Dr. Huey Bienenstock Gerkin saw him and  scheduled him as a case today for me to do.  The patient's jaundice has come  down.  It is now approximately 5, where I think it was as high as 12.  We  will plan on proceeding at this time.  He has a history of atrial  fibrillation.  He is normally on Coumadin.  This has been discontinued  because of the procedures and hopefully the little procedure today be done  so his anticoagulation can be restarted.   DESCRIPTION OF  PROCEDURE:  The patient was taken to the operative suite.  He  is already on Cipro.  Induction of general anesthesia.  With the induction  of general anesthesia, he went into an atrial fibrillation.  They were  getting ready to __________  when he converted after a short time  spontaneously.  The patient has a lower midline incision with a little  hernia at the umbilicus.  I made a little incision just above the umbilicus  and dissected the hernia sac free, opened into it and then was able to place  the Hasson cannula through the hernia sac at the umbilicus.  There are a lot  of adhesions to the right lower quadrant from where basically his cecum was  used to kind of flip around and implant the ureters into it.  We could get  the upper 10 mm trocar under direct vision, and then with this I then  actually dropped down the adhesions that  were from the omentum right  anterior to the basically artificial bladder.  The gallbladder is sub-  acutely inflamed with adhesions and obviously he has had episodes of pain.  Dr. Lurene Shadow has scrubbed in and we placed the lateral trocar.  It was a  little more lateral than usual, since everything is higher.  The omentum was  kind of carefully dissected free from the gallbladder.  The gallbladder was  tense and thickened sub-acutely, and the proximal portion of the gallbladder  was identified.  It encompassed the cystic duct which is very dilated, and  you could feel what felt like two stones in the actual cystic duct.  These  were milked back into the gallbladder and then a clip.  Basically we could  not go across the cystic duct because of its size, but I made a small  opening.  I could not milk any further stones out.  Then I placed a Cook  catheter in it.  An x-ray was obtained.  It does show that there is a stone  in common bile cut just at the junction of where the proximal portion of the  stent is.  I took a Rudich catheter to see if we could actually  try to milk  the stone back through the cystic duct.  It did not want to come back this  way and he will need to repeat the ERCP.  The clip was placed across the  cystic duct and then an Endo loop was placed just under the clip and this  was tied.  The cystic artery had been doubly clipped proximally, singly and  distally and divided.  Then there were some little lymphatics probably that  were clipped and then divided.  Then the gallbladder was freed from it bed.  We did enter into the gallbladder at one location and a small amount of the  bile spilled.  It was aspirated.  Then there was one little area on the bed  of the gallbladder that had to be coagulated for hemostasis.  The  gallbladder was placed in the Endo-Catch bag.  We then inspected.  I did  place a #19 Blake drain in the bed of the gallbladder and brought it out  through the most lateral 5 mm port, and then re-inspected the little area  where we had cauterized and placed a little piece of Surgicel over it, and  then switched the camera to the upper 10 mm port, and withdrew the  gallbladder through the umbilicus in the Endo-Catch bag.  Next, the fascia  was freed up circumferentially so that I could close the fascia with two  figure-of-eight #0 Prolene.  This will close the little hernia defect.  Then  we re-inspected the area.  I had released the carbon dioxide as we were  doing this.  It looks like the fascia is intact and we should not have  injured the artificially-created bladder.  I had placed a Robinson catheter  in the bladder, as he does intermittent catheterizations.  Actually the  patient did this, and we will remove that in the recovery room.  The  subcutaneous wounds were closed with #4-0 Vicryl.  Benzoin and Steri-Strips  were on the skin.  I sutured the #19 Blake drain to the skin and it was  hooked up to the little __________ and suction apparatus.          ______________________________  Anselm Pancoast.  Zachery Dakins, M.D.     WJW/MEDQ  D:  10/09/2005  T:  10/09/2005  Job:  [id number]   cc:   Jordan Hawks. Elnoria Howard, MD  Fax: [phone]

## 2010-09-16 NOTE — Consult Note (Signed)
NAME:  [name], [name] NO.:  [id number]   MEDICAL RECORD NO.:  [id number]          PATIENT TYPE:  INP   LOCATION:  2032                         FACILITY:  [facility]   PHYSICIAN:  Doylene Canning. Ladona Ridgel, MD    DATE OF BIRTH:  Sep 05, 1940   DATE OF CONSULTATION:  07/03/2006  DATE OF DISCHARGE:                                 CONSULTATION   CONSULTATION REQUESTED BY:  Dr. Armanda Magic.   INDICATION FOR CONSULTATION:  Evaluation of nonsustained wide QRS  tachycardia in the setting of sotalol initiation and the setting of  hypertrophic cardiomyopathy.   HISTORY OF PRESENT ILLNESS:  The patient is a 70 year old male who  carries a diagnosis of hypertrophic cardiomyopathy made initially  several years ago by echo which demonstrated an outflow tract gradient  and a mildly thickened ventricular septum.  The patient has had  paroxysmal atrial fibrillation in the past and recently notes that over  the last several months he has had increased fatigue and weakness.  He  states that he just cannot get his second wind when he is doing  strenuous activity.  He was seen for evaluation and subsequently found  to be in atrial fibrillation.  He was initially cardioverted, but did  not maintain sinus rhythm.  He was hospitalized for sotalol therapy.  The patient is on 80 mg twice a day of sotalol, had nonsustained wide  QRS tachycardia and we were called for additional evaluation.  The  patient denies any history of syncope.  He did not have much in the way  of palpitations on a regular basis.  He denies chest pain, he denies  much in the way of shortness of breath except with exertion.  He denies  peripheral edema.  His main symptoms are that of dyspnea on exertion.   PAST MEDICAL HISTORY:  Also notable for:  1. Asymptomatic bradycardia.  2. Mild hypertension.  3. Mild renal insufficiency.  4. He has a history of gallstones.  5. He has a history of bladder cancer status post bladder  resection.  6. HE HAS A QUESTIONABLE HISTORY OF ALLERGY TO ASPIRIN WHICH RESULTED      IN GI BLEED, ALSO WITH IBUPROFEN WHICH RESULTED IN GI BLEED.   FAMILY HISTORY:  Notable for a father who died suddenly at age 63.  The  patient's father had a pacemaker in place.  Mother died of a stroke.   SOCIAL HISTORY:  Patient is married, he is a Occupational psychologist, he  denies tobacco or ethanol abuse.  He denies any illicit drug use.   REVIEW OF SYSTEMS:  Notable for severe arthritis.  Otherwise, all  systems were reviewed and found to be negative except as noted in the  HPI.   PHYSICAL EXAMINATION:  GENERAL:  A pleasant well-appearing man in no  acute distress.  VITALS:  The blood pressure was 150/90, the pulse was 70 and irregular,  respirations were 20, temperature was 97.8.  HEENT:  Normocephalic, atraumatic.  Pupils equal and round.  Oropharynx  was moist.  There were bilateral carotid bruits.  LUNGS:  Clear bilaterally except for minimal rales in the bases.  There  were no wheezes or rhonchi.  CARDIOVASCULAR EXAM:  Demonstrated an irregular rate and rhythm with a  normal S1-S2.  There was a grade 2/6 systolic murmur at right upper  sternal border.  There was no enlargement of the PMI and no lateral  displacement of the PMI.  ABDOMINAL EXAM:  Soft, nontender, nondistended.  There was no  organomegaly.  The bowel sounds were present.  There was no rebound or  guarding.  EXTREMITIES:  Demonstrated no cyanosis, clubbing or edema.  The pulses  were 2+ and symmetric.  NEUROLOGIC EXAM:  Alert and oriented x2, cranial nerves intact, strength  was 5/5 and symmetric.   EKG demonstrates atrial fibrillation with lateral T wave abnormality.   IMPRESSION:  1. Persistent atrial fibrillation.  2. Probable hypertrophic cardiomyopathy with father dying suddenly      (unknown if this was due to hypertrophic cardiomyopathy or not).  3. Wide QRS tachycardia.  This is certainly a ventricular  tachycardia,      though nonsustained and it is likely not proarrhythmia from      sotalol.  4. Coumadin therapy.  5. History of asymptomatic bradycardia.  6. Systemic anticoagulation on Coumadin with INR subtherapeutic.   DISCUSSION:  There are multiple issues with regard to Mr. Drab.  The  first is what his risk for sudden cardiac death is in the setting of  hypertrophic cardiomyopathy and nonsustained ventricular tachycardia.  His father died suddenly and this seems to me like an important fact  that we do not know whether his father was diagnosed with this illness  or not.  It may well be that the patient has a spontaneous mutation  which is not sure.  With regard to the exercise treadmill test, I would  recommend that he go ahead and proceed with this.  If his blood pressure  drops during exercise, this would be another sign of his risk for sudden  death and I would at that point recommend ICD implantation.  Ultimately,  this may well be required anyway.  With regard to the sotalol, he will  need more than 80 mg twice a day and would dose him at 120 mg twice  daily.  With his INR that is subtherapeutic, I would recommend that he  undergo transesophageal echo to rule out a left atrial appendage  thrombus before proceeding with the procedure.      Doylene Canning. Ladona Ridgel, MD  Electronically Signed     GWT/MEDQ  D:  07/03/2006  T:  07/03/2006  Job:  [id number]   cc:   Quita Skye. Artis Flock, M.D.

## 2010-09-16 NOTE — Op Note (Signed)
NAME:  [name], [name]                ACCOUNT NO.:  [id number]   MEDICAL RECORD NO.:  [id number]          PATIENT TYPE:  OIB   LOCATION:  2899                         FACILITY:  [facility]   PHYSICIAN:  Armanda Magic, M.D.     DATE OF BIRTH:  1941/04/28   DATE OF PROCEDURE:  05/24/2006  DATE OF DISCHARGE:  05/24/2006                               OPERATIVE REPORT   PROCEDURE:  Direct current cardioversion.   OPERATOR:  Armanda Magic, M.D.   INDICATIONS:  Atrial fibrillation.   COMPLICATIONS:  None.   IV MEDICATIONS:  Pentothal 100 mg IV.   This is a 70 year old white male with a history of paroxysmal A-fib now  with recurrence of atrial fibrillation who now presents for  cardioversion.  The patient is brought to the day hospital in the  fasting nonsedated state.  Informed consent was obtained.  The patient  was connected to continuous heart rate and pulse oximetry monitoring and  intermittent blood pressure monitoring.  After adequate anesthesia was  obtained, anterior and posterior defibrillation pads were placed on the  patient's left chest and a 100 joules synchronized biphasic shock was  delivered which successfully converted the patient to normal sinus  rhythm.  The patient tolerated the procedure well with no complications.  At the end of the procedure, the patient was discharged to home after  fully awake and ambulating.   ASSESSMENT:  1. Atrial fibrillation with controlled ventricular response.  2. Systemic anticoagulation with therapeutic INR greater than four      weeks.  3. Hypertension.   PLAN:  Continue current medications.  Follow up with me in two weeks.      Armanda Magic, M.D.  Electronically Signed     TT/MEDQ  D:  05/24/2006  T:  05/24/2006  Job:  [id number]   cc:   Quita Skye. Artis Flock, M.D.

## 2010-09-16 NOTE — Assessment & Plan Note (Signed)
[facility]                           GASTROENTEROLOGY OFFICE NOTE   NAME:[name], [name]                       MRN:          [id number]  DATE:11/22/2005                            DOB:          Feb 26, 1941    PRIMARY CARE PHYSICIAN:  Dr. Bradd Canary   GENERAL SURGEON:  Dr. Thornton Dales   GI PROBLEM LIST:  Status post cholecystectomy for acute cholecystitis June  2007.  Done laparoscopically by Dr. Thornton Dales.  Preoperatively, his  bilirubin was 5 and ERCP done by Dr. Jeani Hawking suggested a distal CBD  stricture of 2-3 cm.  Brushings were negative.  CA 19-9 was greater than  4000.  Intraoperatively, CBD stone seen and so postoperatively ERCP  repeated.  Stone verified but distal CBD stricture would not allow for  removal.  Stent replaced into CBD.  Followup CT scan 10 days after discharge  showed no pancreatic abnormality.  Followup CA 19-9 last week (per patient)  was normal.   INTERVAL HISTORY:  I last saw [name] at the time of his  hospitalization.  There was question about a distal CBD stricture at that  time that made it difficult to remove choledocholithiasis.  Given an  initially very elevated CA 19-9 there was concern that he indeed had a  pancreatic or biliary malignancy, especially given the stricture seen on  ERCPs.  He had a CT scan after discharge several days to let things cool  down and fortunately no masses were seen in or around his pancreas.  He  returns now to discuss stent removal and further workup.   He has done very well postoperatively.  He does have mild twinges of pain in  his right upper quadrant.  No fevers, no chills, he is putting on weight.  He has had no return of jaundice.   CURRENT MEDICINES:  Cartia, metoprolol, Feldene.   PHYSICAL EXAMINATION:  VITAL SIGNS:  5 feet 10.5 inches, 209 pounds.  Blood  pressure 130/68, pulse 60.  CONSTITUTIONAL:  Generally well-appearing.  LUNGS:  Clear to  auscultation bilaterally.  ABDOMEN:  Soft, nondistended, nontender, normal bowel sounds.  HEART:  Regular rate and rhythm.   ASSESSMENT AND PLAN:  A 70 year old man status post recent cholecystectomy,  question distal common bile duct stricture, question retained common bile  duct stone.   I will arrange for [name] to have repeat endoscopic procedure.  It is  quite possible that the distal CBD stricture was just simply an acute  phenomenon from maybe head of pancreas inflammation from his cholecystitis,  possibly a recently passed CBD stone.  I think it is safest to arrange for  him to have endoscopic ultrasound for a last look at the head of his  pancreas.  If that looks normal I will convert him to an ERCP, remove the  stent that is in place, and see if the  stone is still present.  If it is, I will remove it endoscopically.  Will  arrange for this to be done next week.  Rachael Fee, MD   DPJ/MedQ  DD:  11/22/2005  DT:  11/22/2005  Job #:  [id number]   cc:   Quita Skye. Artis Flock, MD  Anselm Pancoast. Zachery Dakins, MD

## 2010-09-16 NOTE — Discharge Summary (Signed)
NAME:  [name], [name] NO.:  [id number]   MEDICAL RECORD NO.:  [id number]          PATIENT TYPE:  INP   LOCATION:  2032                         FACILITY:  [facility]   PHYSICIAN:  Armanda Magic, M.D.     DATE OF BIRTH:  Jan 17, 1941   DATE OF ADMISSION:  07/02/2006  DATE OF DISCHARGE:  07/06/2006                               DISCHARGE SUMMARY   ADMISSION DIAGNOSIS:  Recurrent atrial fibrillation, rate controlled.   DISCHARGE DIAGNOSES:  1. Recurrent paroxysmal atrial fibrillation, rate controlled status      post sotalol loading twice daily.  2. Hypertrophic obstructive cardiomyopathy.  3. Hypertension.  4. Systemic anticoagulation with a subtherapeutic INR.  5. Status post direct-current cardioversion to normal sinus rhythm      February 2008, now with recurrent atrial fibrillation.  6. Asymptomatic bradycardia.  7. Mild renal insufficiency.  8. History of gallstones.  9. History of bladder cancer status post bladder resection and      radiation therapy.  10.Intolerance to ASPIRIN and IBUPROFEN, both of which resulted in a      gastrointestinal bleed.  11.Family history of hypertrophic obstructive cardiomyopathy in his      father.  12.Wide-complex tachycardia secondary to sotalol initiation, resolved.  13.Stress Cardiolite negative for inducible ischemia July 04, 2006.      Left ventricular ejection fraction could not be gated secondary to      atrial fibrillation.   CONSULTATIONS:  [place] [facility].   PROCEDURES:  Transesophageal echocardiogram July 06, 2006.   [name] COURSE:  [name] is a 70 year old male with no known  history of coronary artery disease.  He has a history of paroxysmal  atrial fibrillation status post DCCV with conversion to normal sinus  rhythm in February 2008; however, reverted back to atrial fibrillation.  He was admitted on July 02, 2006, with recurrent atrial fibrillation.  He is on systemic anticoagulation with Coumadin therapy  secondary to  atrial fibrillation and was admitted with a subtherapeutic INR.  The  patient was admitted to the [name] for sotalol loading.  He  was started on sotalol 80 mg twice daily and experienced a wide-complex  tachycardia upon initiation.  The sotalol was placed on hold secondary  to a [place] EP consult which was called.  The patient was scheduled for  a stress Cardiolite to rule out ischemia which  EP was in  agreement with.  He underwent stress Cardiolite on July 04, 2006, which  was negative for inducible ischemia.  LVEF could not be gated secondary  to atrial fibrillation.  The patient experienced a normal blood pressure  response during stress testing.  Had his blood pressure dropped during  exercise, he would have been recommended for ICD implantation.  It was  recommended to continue the patient on sotalol; however, increase the  dosage to 120 mg twice daily.  The patient also underwent a TEE to rule  out a left atrial appendage thrombus prior to proceeding with a repeat  DCCV.  Upon performing the TEE to rule out left atrial appendage or  thrombus secondary  to continuing his sotalol therapy, the plan is to  sufficiently anticoagulate the patient with a therapeutic INR in the  range of 2-3 for 3-4 weeks prior to restarting sotalol as an outpatient.  Sotalol was discontinued just prior to discharge.  The patient was  discharged to home in stable condition on July 06, 2006, hours after the  TEE procedure.  He was stable without complaints of chest pain,  shortness of breath, palpitations, dizziness, or fatigue.  He ambulated  in the hallway without symptoms.  Dr. Mayford Knife saw and examined the  patient and was in agreement with his discharge.   LABORATORY DATA:  PT 26.0, INR 2.2.  Sodium 139, potassium 4.6, chloride  104, CO2 26, glucose 95, BUN 28, creatinine 1.7, calcium 9.3.  TSH  0.841.  White blood count 7.3; hemoglobin 14.5; hematocrit 42.5;   platelets 219,000.  Total bilirubin 0.7, alkaline phosphatase 51, AST  21, ALT 21, total protein 6.4, serum albumin 3.4.   X-rays:  None during this admission.   Twelve-lead EKGs:  A 12-lead EKG July 05, 2006, revealed atrial  fibrillation with a ventricular rate of 81 beats per minute with a QTC  of 506 milliseconds.  There was no evidence of acute ST-segment/T-wave  changes.   CONDITION ON DISCHARGE:  Stable.   DISCHARGE MEDICATIONS:  1. Lopressor 25 mg twice daily.  2. Norvasc 5 mg daily.  This represented a new prescription.  A      prescription was given with refills.  3. Coumadin take as directed.   DISCHARGE INSTRUCTIONS:  1. Continue a low-salt, low-fat, and low-cholesterol diet.  2. No restrictions on activity.   FOLLOWUP ARRANGEMENTS:  1. [facility] appointment for PT and INR check has been      scheduled for March 10 at 11:15 a.m.  2. Followup appointment with Dr. Armanda Magic with an EKG on March 19      at 9:15 a.m.  The patient is scheduled to call [phone] if he is      unable to make scheduled appointment.  3. EKG to check QTC on March 10 at 11:15 a.m.      [name], [place]      Armanda Magic, M.D.  Electronically Signed    RDM/MEDQ  D:  07/18/2006  T:  07/18/2006  Job:  [id number]   cc:   Quita Skye. Artis Flock, M.D.

## 2010-09-16 NOTE — Discharge Summary (Signed)
NAME:  [name], [name] NO.:  [id number]   MEDICAL RECORD NO.:  [id number]          PATIENT TYPE:  INP   LOCATION:  5508                         FACILITY:  [facility]   PHYSICIAN:  Lonia Blood, M.D.DATE OF BIRTH:  [birth year]   DATE OF ADMISSION:  10/06/2005  DATE OF DISCHARGE:  10/12/2005                                 DISCHARGE SUMMARY   PRIMARY CARE PHYSICIAN:  Quita Skye. Artis Flock, M.D.   GASTROENTEROLOGIST:  Jordan Hawks. Elnoria Howard, MD.   GENERAL SURGEON:  Anselm Pancoast. Zachery Dakins, M.D.   DISCHARGE DIAGNOSES:  1.  Choledocholithiasis.      1.  Resultant acute pancreatitis and transaminitis      2.  Status post cholecystectomy via laparoscopic approach.      3.  Status post ERCP x2 with stent placement but inability to remove          retained common bile duct stone.  2.  Elevated CA19-9.      1.  CEA normal.      2.  Long circumferential common bile duct stricture.      3.  Unable to obtain pancreatic protocol CT scan due to renal          insufficiency.      4.  Outpatient CT to be pursued.  3.  Atrial fibrillation.      1.  Rapid ventricular response during hospitalization.      2.  Controlled with addition of Cardizem.      3.  Off Coumadin in the postoperative period.  4.  Multiple tattoos - hepatitis panel negative during this hospitalization.  5.  Acute renal failure - secondary to generalized illness - slowly      improving.  6.  Transitional cell carcinoma of the bladder.      1.  Status post radiation therapy and chemotherapy.      2.  Status post recurrent small lesions within the body required          multiple biopsies and cystoscopies.      3.  Status post bladder excision with urostomy placed in 2002.  7.  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  8.  Remote history of extensive tobacco use in the amount of one pack per      day x33 years, discontinued in 1981.   OUTPATIENT  MEDICATIONS:  1.  Lopressor 25 mg b.i.d.  2.  Cardizem CD 180 mg daily.  3.  Vicodin one every four hours p.r.n. pain.  4.  The patient is instructed to hold his Coumadin.  5.  The patient is instructed to not take lisinopril.   FOLLOW UP:  1.  The patient is advised to follow with Anselm Pancoast. Zachery Dakins, M.D., as      discussed.  2.  The patient will present to outpatient radiology on Thursday October 19, 2005, for pancreatic protocol, CT scan of the pancreas.  3.  Patient is to follow up with Dr. Artis Flock  in five to seven days and a      recheck of BMET is advised that time as well as evaluation of heart rate      and blood pressure.   CONSULTATIONS:  1.  Dr. Thornton Dales with general surgery.  2.  Dr. Elnoria Howard with gastroenterology/Dr. Marina Goodell.   PROCEDURES:  1.  Laparoscopic cholecystectomy.  2.  ERCP x2.   HISTORY OF PRESENT ILLNESS:  For details concerning admission, please see  history and physical dictated by Dr. Sharon Seller on October 06, 2005.   HOSPITAL COURSE:  [name] was admitted on October 06, 2005, with  complaints of jaundice.  He was found to have a markedly elevated total  bilirubin at 11 and transaminitis as well as an acute renal failure.  Ultrasound of the abdomen was with obtained which revealed findings  consistent with acute cholecystitis as well as intra- and extrahepatic  ductal dilatation.  GI and general surgery were consulted.  The patient  first underwent ERCP.  This confirmed the diagnosis of choledocholithiasis.  A stent was placed in attempt to decompress the biliary tree.  The patient  was taken to general surgery and underwent a laparoscopic cholecystectomy.  The patient tolerated this without significant difficulty.  Further workup  revealed that the patient's elevated total bilirubin and transaminases were  improving.  CA19-9 and CEA were obtained during initial GI evaluation.  CEA  in fact return normal but that CA19-9 was found to be markedly  elevated at a  value of 4414.  There was a desire to pursue this further with a focused  pancreatic CT scan but patient's renal insufficiency precluded this.  Intraoperative cholangiogram was done at the time of the patient's  laparoscopic cholecystectomy and this revealed evidence of a stone within  the common bile duct.  As a result, the patient went back for repeat ERCP.  Attempts were made to sweep the stone from the common bile duct but these  were unsuccessful.  It was appreciated that bile was draining around the  stone and also through the patient's stent at the time of the second ERCP.  The decision was made therefore to simply monitor the stone.  Postoperative  care was provided by Dr. Zachery Dakins with general surgery.  The patient  progressed nicely in the postoperative period and was able to be advanced to  a regular diet.  In the immediate postoperative period, the patient did have  some difficulty with uncontrolled atrial fibrillation.  Ventricular response  rate as high as 150-160 was recorded.  Cardizem was added short-term to the  patient's beta blocker which was resumed in the postoperative period.  With  this, the heart rate was able to be well controlled.  Coumadin had to be  held because of the patient's recent surgery.  Coumadin will continue be  held at the time of discharge.  The patient is aware of the increased risk  of thromboembolic CVA associated with this in the setting of atrial  fibrillation.  The patient's renal function did improve nicely with  hydration.  At time of presentation, creatinine was noted be elevated at  2.8.  Baseline creatinine from May 2001, was found to be 1.5.  With  hydration and maintenance of normal volume status, the patient's creatinine  did improve.  At the time of discharge, creatinine had decreased to 1.8 with  a nadir the day before of 1.79.  At the time of discharge, the patient is tolerating regular diet  without  difficulty.    FOLLOW-UP ISSUES:  Following up with Dr. Zachery Dakins to assure that  postoperative period is clear and also obtaining CT scan of the abdomen  using pancreatic protocol to assure that the possibility of a pancreatic,  biliary or hepatic cancer is effectively ruled out.  Lisinopril is being  discontinued because of the patient's acute renal insufficiency and Cardizem  is being to aid in ventricular response rate control.      Lonia Blood, M.D.  Electronically Signed     JTM/MEDQ  D:  10/12/2005  T:  10/12/2005  Job:  [id number]   cc:   Quita Skye. Artis Flock, M.D.  Fax: [phone]   Jordan Hawks. Elnoria Howard, MD  Fax: [phone]

## 2010-09-16 NOTE — Op Note (Signed)
[place]  Patient:    [name]                        MRN: [id number] Proc. Date: 05/27/99 Adm. Date:  [id number] Attending:  Liborio Nixon                           Operative Report  PREOPERATIVE DIAGNOSIS:  History of bladder cancer with small bladder lesion, right hydrocele.  POSTOPERATIVE DIAGNOSIS:  History of bladder cancer with small bladder lesion, right hydrocele.  PRINCIPAL PROCEDURE:  Right hydrocelectomy, cystoscopy, bladder biopsy.  SURGEON:  Bertram Millard. Dahlstedt, M.D.  ANESTHESIA:  General endotracheal.  COMPLICATIONS:  None.  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  The patient has undergone combined radiation and chemotherapy several years ago, and has gone through protocol and frequent cystoscopies with no evidence of extravesical disease.  He was recently found to have a small bladder lesion.  Also, he has a significant, symptomatic right hydrocele and desires this removed.  Risks and complications of the procedure were discussed with the patient who desires to proceed.  DESCRIPTION OF PROCEDURE:  The patient was administered a general anesthetic. e was placed in the supine position.  A first flexible cystoscopy was performed after sterile prep and drape.  This showed a small lesion in the left lateral bladder.  I could not biopsy this through the flexible scope and decided to wait to do a rigid cystoscopy later in the case.  At this point, the flexible scope was removed, the patients genitalia and perineum were prepped and draped.  An incision, approximately 3 cm long was made in the median raphe superiorly and carried down through dartos fascia with electrocautery. The right tunica vaginalis was dissected down upon, and it was delivered from the scrotum.  There was a significant hydrocele.  The hydrocele sac was  opened and he edges cauterized.  The edges were imbricated posteriorly behind the testicle and cord.  3-0 chromic placed in a running fashion was used to imbricate this.  All  bleeders were electrocoagulated.  The left hemiscrotum was opened down to the tunica vaginalis which was also opened, and a very small amount of fluid removed. I did not need to imbricate this, but just everted the tunica vaginalis.  The testicles were replaced in the scrotum after blocked with 0.25% plain Marcaine,  approximately 14 cc and both cords in the skin.  The dartos fascia was reapproximated using 3-0 chromic placed in a running simple fashion.  The skin edges were reapproximated using 4-0 chromic in a running subcuticular fashion.  At this point, rigid cystoscopy was performed.  Urethra and prostatic urethra were unremarkable.  The bladder was entered and inspected circumferentially.  The urothelium was blanched throughout the bladder with quite a few telangiectatic vessels, none of which looked different from prior cystoscopies.  There was, however, one lesion in the left lateral wall superiorly which had bullous-type mucosa.  This was biopsied several times with the cold cup biopsy forceps. Additionally, another lesion was seen anteriorly in the bladder wall.  This was  sent as an anterior bladder lesion.  Random bladder biopsies were also taken. hey will be sent for permanent specimen.  Bladder lesions and biopsy sites were then cauterized with the Bovie electrode.  At this point, the bladder was  drained and the procedure terminated.  The patient tolerated the procedure well.  He was returned to the PACU in stable condition after a fluff dressing and a jock strap were placed. DD:  05/27/99 TD:  05/29/99 Job: [id number]

## 2010-09-16 NOTE — H&P (Signed)
NAME:  [name], [name] NO.:  [id number]   MEDICAL RECORD NO.:  [id number]          PATIENT TYPE:  EMS   LOCATION:  MAJO                         FACILITY:  [facility]   PHYSICIAN:  Lonia Blood, M.D.DATE OF BIRTH:  03-24-1941   DATE OF ADMISSION:  10/06/2005  DATE OF DISCHARGE:                                HISTORY & PHYSICAL   PRIMARY CARE PHYSICIAN:  Dr. Artis Flock   CHIEF COMPLAINT:  Jaundice.   HISTORY OF PRESENT ILLNESS:  [name] is a very pleasant 70 year old  gentleman with a medical history as detailed below.  Approximately seven  days ago he developed the sudden onset of vomiting.  He reports he did not  even feel significantly nauseated at the time.  He reports he vomited a dark  yellowish material.  He went on about his day and noted as the day continued  that he was feeling weak in general and was having some vague abdominal  pain.  By the following day he began to have gnawing right upper quadrant  abdominal pain.  His appetite was decreased.  His intake was poor.  He began  to have bowel movements on a regular basis, but specifically denies actual  diarrhea.  Symptoms continued for the next two to three days.  At this point  he felt that he needed to be evaluated and went to see Dr. Artis Flock for the  first time ever.  Dr. Artis Flock did a urinalysis which apparently revealed  blood and findings concerning for the possibility of urinary tract  infection.  Ciprofloxacin was initiated at that time.  Patient was sent  home.  He returned yesterday complaining that his symptoms were not  improving and reporting significant abdominal pain.  CMET was obtained which  revealed elevated LFTs.  Patient was sent for an ultrasound which revealed  findings consistent with acute cholecystitis.  Patient was informed by Dr.  Artis Flock that he should present to the emergency room for evaluation.  GI was  called to evaluate the patient.  The patient ultimately never reported  to  the emergency room.  This morning the patient awoke and continued to feel  poorly.  He does state, however, that his symptoms are somewhat improved  from last night, though they do persist.  As a result, he contacted Dr.  Artis Flock who then again encouraged him to report to the emergency room  immediately for evaluation.   In the emergency room the patient is awake and alert.  He appears  comfortable.  He reports mild right upper quadrant pain.  He denies vomiting  and reports only mild low-grade nausea.  There has been no diarrhea.  There  has been no shortness of breath.  There have been no fevers or chills.  His  appetite has been poor.  He does not drink and denies recent drinks and has  not had any significant change in his diet.   REVIEW OF SYSTEMS:  Comprehensive review of systems is unremarkable with the  exception of elements noted in the history of present illness noted above.  PAST MEDICAL HISTORY:  1.  Transitional cell carcinoma of the bladder.      1.  Status post radiation therapy and chemotherapy.      2.  Status post recurrent small lesions within the bladder requiring          multiple biopsies and cystoscopies.      3.  Status post bladder excision with urostomy placed in 2002.  2.  Cholelithiasis diagnosed in MRI/MRA December 2006.  3.  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  4.  Remote history of extensive tobacco use in the amount of one pack per      day x33 years discontinued in 1981.  5.  Chronic atrial fibrillation on Coumadin therapy.   OUTPATIENT MEDICATIONS:  1.  Metoprolol 25 mg b.i.d.  2.  Lisinopril 20 mg daily.  3.  Ciprofloxacin 250 mg p.o. b.i.d. since Monday of this week (four days).  4.  Felodipine 5 mg daily.  5.  Coumadin 5 mg on Sunday, Tuesday, Thursday, Saturday and 2.5 mg on      Monday, Wednesday, Friday.   ALLERGIES:  No known drug allergies.   FAMILY HISTORY:   Noncontributory secondary to age.   SOCIAL HISTORY:  Patient lives in [place].  He is married.  He does not  drink alcohol at present, but has a distant history of heavy alcohol abuse.  He is retired.   DATA REVIEW:  White count is 9.3, hemoglobin is 14.6, MCV is 91, platelet  count is 302.  Sodium is mildly decreased at 133, potassium is normal,  chloride is normal, bicarbonate is normal, BUN is elevated at 52, creatinine  is elevated at 2.8 and was 1.03 November 1999.  Serum glucose is mildly elevated  at 120.  Calcium is normal.  AST is 364, ALT is 559, alkaline phosphatase is  539, total bilirubin is 11, total protein is 6.9, albumin is 3.7.  PT is 34  with an INR of 9.1.  PTT is 84.  Lipase is 57.  Ultrasound done yesterday  reveals thickened gallbladder wall with multiple stones and tenderness at  time of examination with intra and extrahepatic ductal dilatation with the  common bile duct being 1.3 cm diameter and note is also made of mild  pancreatic ductal dilatation.   PHYSICAL EXAMINATION:  VITAL SIGNS:  Temperature 97, blood pressure 100/69,  heart rate 51, respiratory rate 16, O2 saturation 97% on room air.  GENERAL:  Well-developed, well-nourished male in no acute respiratory  distress.  HEENT:  Normocephalic, atraumatic.  Pupils are equal, round, and reactive to  light and accommodation.  Extraocular muscles intact bilaterally.  OC/OP  clear.  NECK:  No JVD.  No lymphadenopathy.  LUNGS:  Clear to auscultation bilaterally without wheezes or rhonchi.  CARDIOVASCULAR:  Regular rate and rhythm at present without gallop or rub  with a 2/6 holosystolic murmur.  ABDOMEN:  Obese, soft, tender to deep palpation in the right upper quadrant.  There is no rebound.  There is no ascites.  Bowel sounds are present, but  hypoactive.  There is no focal mass able to be appreciated. EXTREMITIES:  No significant clubbing, cyanosis, edema bilateral lower  extremities.  NEUROLOGIC:   Neurologic examination is nonfocal.  Patient is alert and  oriented x4.  CUTANEOUS:  Patient is markedly jaundiced with upper extremity bruises at  previous sites of venipuncture.  Patient has multiple tattoos  across his  upper body.   IMPRESSION AND PLAN:  1.  Obstructive choledocholithiasis with acute cholecystitis and gallstone      pancreatitis.  MRI scan in December of 2006 revealed that the patient      did have cholelithiasis.  At that time it was completely asymptomatic.      The current clinical situation suggests that the patient probably passed      the stone into his common bile duct approximately one week ago.  It      appears that this stone has caused obstructing symptoms to include      hepatic inflammation as well as pancreatitis.  The patient does state      that his symptoms improved significantly last night.  This makes me      think that the patient could have possibly passed a stone of his own.      Unfortunately, CMET obtained in Dr. Blair Heys office is not readily      available to me at the present time and I am therefore not able to      compare his transaminases or his bilirubin.  I was told, however, that      his bilirubin was round 9 yesterday, thereby putting in question the      thought that maybe he has already passed his stone.  The patient is not,      however, in severe pain at the present time.  Given his transaminitis      and his mild, but present, pancreatitis, we would certainly prefer to      avoid acute surgical intervention.  This patient appears to be      appropriate for ERCP and this could potentially stabilize the patient      until he is better suited to undergo general surgery.  Dr. Marina Goodell knows      the patient from his conversation with Dr. Artis Flock yesterday.  I have      therefore called [facility] who will reevaluate the patient.  We are      reversing his Coumadin in case he does not require instrumentation in      the short course.  2.   Coumadin toxicity.  Patient is on chronic Coumadin due to atrial      fibrillation.  I have explained to him the risks of thromboembolism most      seriously causing stroke in the absence of Coumadin in the presence of      atrial fibrillation.  I have explained to him, however, that we need to      reverse his Coumadin due to his acute liver disease as well as the      possible need for instrumentation/surgery.  Patient understands the      risks and agrees to proceed.  We will dose him with 10 mg of vitamin K      IV over one hour.  We will recheck coags approximately two hours later.      If they remain elevated then we will administer fresh frozen plasma.      Patient will be typed and crossed and fresh frozen plasma will be ready      should it be needed.  3.  Atrial fibrillation.  On auscultation the patient is actually in normal     sinus rhythm at the present time.  We will check an EKG.  We will hold      Coumadin but we will continue  intravenous beta blocker for rate control.  4.  Acute renal failure.  Patient has a baseline creatinine of 1.5 in July      of 2001.  Given this patient's size, that likely represents normal renal      function.  Today, however, his care is 2.8 which suggests a significant      degree of acute renal failure.  This is likely secondary to mild      hypotension related to his disease process and decreased p.o. intake as      well as ongoing ACE inhibitor use.  We will, of course, hold his ACE      inhibitor.  We will check a urinalysis.  The patient is being covered      empirically for ascending cholangitis with Cipro and Flagyl and the      Cipro should provide adequate coverage for the typical urinary      pathogens.  5.  History of transitional cell carcinoma.  Patient is asymptomatic at this      time.  He reports no difficulty with his urostomy.  6.  Multiple tattoos.  Certainly, the patient's elevated transaminases at      present are related to  his obstructing choledocholithiasis.  However, I      do not have a baseline CMET on this patient and the patient has had      multiple tattoos at various stages throughout his life.  To his      knowledge he has not ever been tested for hepatitis and therefore I will      obtain a hepatitis panel to assure he does not have a chronic smoldering      hepatitis in the background of his acute illness.      Lonia Blood, M.D.  Electronically Signed     JTM/MEDQ  D:  10/06/2005  T:  10/06/2005  Job:  [id number]   cc:   Quita Skye. Artis Flock, M.D.  Fax: [phone]   Wilhemina Bonito. Marina Goodell, M.D. [facility]  [address]  [place]  [place] [address]

## 2010-09-16 NOTE — Consult Note (Signed)
NAME:  [name], [name] NO.:  [id number]   MEDICAL RECORD NO.:  [id number]          PATIENT TYPE:  INP   LOCATION:  5508                         FACILITY:  [facility]   PHYSICIAN:  Velora Heckler, MD      DATE OF BIRTH:  November 25, 1940   DATE OF CONSULTATION:  10/08/2005  DATE OF DISCHARGE:                                   CONSULTATION   REFERRING PHYSICIAN:  Dr. Jeani Hawking, gastroenterology.  Dr. Lonia Blood,  [facility].   CHIEF COMPLAINT:  Abdominal pain, cholelithiasis, choledocholithiasis,  distal common bile duct stricture.   HISTORY OF PRESENT ILLNESS:  [name] is a pleasant 70 -year-old white  male admitted on the medical service October 06, 2005 with jaundice and  abdominal pain.  The patient had a known history of gallstones dating back  to an MRI scan in December 2006.  The patient had developed abdominal pain,  low grade fever, leukocytosis, and abnormal liver function tests.  He was  seen by Dr. Quita Skye. Kindl, his primary care physician, placed on oral  antibiotics.  The patient was then admitted and seen by gastroenterology for  ERCP.  This study was performed [place] and showed a distal common bile duct  stricture which was dilated and a stent was placed.  No stones were  identified in the common duct at the time of the procedure.  The patient  does have gallstones.  Liver function test were markedly abnormal but have  improved as of this morning.  General surgery is now called for  consideration for cholecystectomy during this admission.   PAST MEDICAL HISTORY:  Significant past medical history including  transitional cell carcinoma of the bladder, status post radiation therapy,  chemotherapy.  He has a continent reconstruction in the right lower abdomen.  History of atrial fibrillation on anticoagulation, history of renal  insufficiency, history of hypertension.   MEDICATIONS:  Metoprolol, lisinopril, Cipro, Coumadin.   ALLERGIES:  None  known.   SOCIAL HISTORY:  The patient lives in [place].  He is married.  He denies  alcohol use.  He is retired.   FAMILY HISTORY:  Unremarkable.   REVIEW OF SYSTEMS:  A 15 system review without significant other findings  except as noted above.   EXAM:  GENERAL:  A 70 year old bright and alert white male in no acute  distress on ward [address] [facility].  VITAL SIGNS:  Temperature 98.5, pulse 56 and irregular, respirations 20,  blood pressure 134/65, O2 saturation 95% room air.  HEENT:  Shows him to be normocephalic, atraumatic.  Sclerae are mildly  icteric.  Dentition is fair.  Mucous membranes moist.  Voice normal.  NECK:  Supple without mass or tenderness.  LUNGS:  Clear to auscultation bilaterally without rales, rhonchi or wheeze.  CARDIAC:  Exam shows and irregular rhythm with a slight bradycardia.  Peripheral pulses are full.  ABDOMEN:  Soft without distension.  Well-healed midline incision.  There is  a reducible incisional hernia at the level of the umbilicus.  There is a  dressing over the stoma  in the right lower abdominal wall.  There is mild  tenderness to palpation in the right upper quadrant.  There is no guarding.  There is no rebound.  There is no palpable mass.  EXTREMITIES:  Nontender without edema.  NEUROLOGICALLY:  The patient is alert and oriented without focal deficit.   LABORATORY STUDIES:  Liver function tests showed a marked decrease with a  total bilirubin 5 down from 12.1.  Electrolytes are normal.  Creatinine has  fallen to 2 with resuscitation.  White count is normal at 6.9, hemoglobin  normal at 12.1.  Prothrombin time is 14.9 with an INR of 142.  Lipase was at  the upper range normal at 50.   RADIOGRAPHY:  ERCP studies from June 9 reviewed and placed in the medical  record.   IMPRESSION:  1.  Cholelithiasis.  2.  Probable chronic cholecystitis.  3.  Probable recent choledocholithiasis.  4.  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  5.  Atrial fibrillation on anticoagulation.  6.  History of transitional cell carcinoma of the bladder.  7.  History of renal insufficiency.   PLAN:  The patient and I discussed the indications for cholecystectomy.  We  discussed laparoscopic versus open surgery.  We discussed the fact that  previous surgery may limit out ability with the laparoscope and he may  require conversion to open surgery but we will certainly attempt to perform  this laparoscopically.  We discussed doing cholangiography.  We discussed  his postoperative recovery and return to activities.  He understands and  wishes to proceed.  Dr. Consuello Bossier will be the surgeon of the week at  [facility] for our practice, [facility].  I will  schedule the case with the operating room for Dr. Zachery Dakins for Monday June  11.  We will continue to hold the patient's Coumadin therapy.  We will  recheck all of his laboratory studies on the morning of surgery.      Velora Heckler, MD  Electronically Signed     TMG/MEDQ  D:  10/08/2005  T:  10/09/2005  Job:  [id number]   cc:   Lonia Blood, M.D.   Jordan Hawks Elnoria Howard, MD  Fax: [phone]   Quita Skye. Artis Flock, M.D.  Fax: [phone]

## 2010-09-16 NOTE — Op Note (Signed)
[place]  Patient:    [name], [name]                       MRN: [id number] Proc. Date: 11/25/99 Adm. Date:  [id number] Disc. Date: [id number] Attending:  Liborio Nixon                           Operative Report  PREOPERATIVE DIAGNOSIS:  History of transitional cell carcinoma of the bladder, status post radiation with recurrent hematuria and small bladder lesions.  POSTOPERATIVE DIAGNOSIS:  History of transitional cell carcinoma of the bladder, status post radiation with recurrent hematuria and small bladder lesions.  PRINCIPAL PROCEDURE:  Cystoscopy, random bladder biopsies, directed bladder biopsies.  ANESTHESIA:  General with LMA.  COMPLICATIONS:  None.  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  He has done fairly well, but has recurrent hematuria and has had several biopsies in the past for lesions of the bladder, none of which have shown evidence of recurrent cancer.  The patient recently had hematuria and recent cystoscopy in the office revealed the patient to have several bladder lesions anteriorly on his bladder neck region.  He presents, at this time for cysto and anesthetic biopsy, having been informed of risks and complications of this procedure.  He agrees to proceed.  DESCRIPTION OF PROCEDURE:  The patient was administered a general anesthetic using the LMA.  He was placed in the dorsal lithotomy position and genitalia and perineum were prepped and draped.  A cystoscope was passed under direct vision through his urethra which was without abnormalities.  Prostatic urethra was free of lesions.  The bladder was then inspected, circumferentially. There were scattered telangiectatic areas consistent with radiation change. There were three lesions in the bladder - two were in the midline on  the anterior bladder wall near the bladder neck, the other was on the right side of the bladder near the bladder neck area as well, anteriorly.  Random bladder biopsies were taken of the small telangiectatic vessels mentioned earlier. Directed biopsies, with the first bladder biopsy being the brownish. necrotic-looking tissue at the anterior bladder neck, medially.  The second bladder biopsy was of the small papillary configured lesion at the right bladder neck.  All of the lesions were biopsied with the cold cup forceps, such that no abnormal tissue was left behind.  Following this, the bladder areas that were biopsied were cauterized with electrocautery/Bugee electrode.  No bleeding was seen.  At this point, the bladder was drained and the scope removed.  No catheter was left indwelling.  The patient was awakened and taken to the PACU in stable condition.  He will follow up in one to two weeks and I will call him with this pathology. DD:  11/25/99 TD:  11/26/99 Job: [id number]

## 2010-09-27 ENCOUNTER — Other Ambulatory Visit: Payer: Self-pay | Admitting: Dermatology

## 2010-11-21 ENCOUNTER — Ambulatory Visit (HOSPITAL_COMMUNITY)
Admission: RE | Admit: 2010-11-21 | Discharge: 2010-11-21 | Disposition: A | Discharge status: Home / Self Care | Payer: Medicare Other | Source: Ambulatory Visit | Attending: Cardiology | Admitting: Cardiology

## 2010-11-21 DIAGNOSIS — N289 Disorder of kidney and ureter, unspecified: Secondary | ICD-10-CM | POA: Insufficient documentation

## 2010-11-21 DIAGNOSIS — I359 Nonrheumatic aortic valve disorder, unspecified: Secondary | ICD-10-CM | POA: Insufficient documentation

## 2010-11-21 DIAGNOSIS — I421 Obstructive hypertrophic cardiomyopathy: Secondary | ICD-10-CM | POA: Insufficient documentation

## 2010-11-21 DIAGNOSIS — I4891 Unspecified atrial fibrillation: Secondary | ICD-10-CM | POA: Insufficient documentation

## 2010-11-21 HISTORY — PX: CARDIOVERSION: SHX1299

## 2010-11-21 LAB — BASIC METABOLIC PANEL
CO2: 25 mEq/L (ref 19–32)
Chloride: 105 mEq/L (ref 96–112)
Creatinine, Ser: 2.34 mg/dL — ABNORMAL HIGH (ref 0.50–1.35)
Potassium: 4.3 mEq/L (ref 3.5–5.1)

## 2010-12-01 NOTE — Op Note (Signed)
  NAME:  [name], [name] NO.:  [id number]  MEDICAL RECORD NO.:  [id number]  LOCATION:  CATH                         FACILITY:  [facility]  PHYSICIAN:  Armanda Magic, M.D.     DATE OF BIRTH:  08/03/1940  DATE OF PROCEDURE:  11/21/2010 DATE OF DISCHARGE:                              OPERATIVE REPORT   PRIMARY PHYSICIAN:  Quita Skye. Kindl, MD  PROCEDURE:  Direct current cardioversion.  OPERATOR:  Armanda Magic, MD  INDICATIONS:  Atrial flutter.  COMPLICATIONS:  None.  IV MEDICATIONS:  Propofol 60 mg IV.  This is a 70 year old male with a history of hypertrophic obstructive cardiomyopathy and mild aortic stenosis, as well as renal insufficiency and atrial fibrillation who had had recurrence of atrial fibrillation. His amiodarone was adjusted and he now presents back for cardioversion.  [facility] fasting nonsedated state.  Informed consent was obtained.  The patient was connected to continuous heart rate and pulse oximetry monitor and blood pressure monitor.  Defibrillator pads were paced in the left anterior chest and posterior back.  After adequate anesthesia was obtained, a 150 joules synchronized biphasic shock was delivered which successfully converted the patient to sinus bradycardia.  The patient tolerated the procedure well without complications and was later discharged home.  ASSESSMENT: 1. Atrial fibrillation/flutter on amiodarone. 2. Systemic anticoagulation with therapeutic INR. 3. Successful cardioversion to sinus bradycardia.  PLAN:  Discharged home after IV fluid and bedrest are completed.  Will follow up on November 23, 2010, with my nurse practitioner, Cristopher Peru for blood pressure check and EKG.  Of note, the patient misunderstood my nurse last week.  Because of his renal insufficiency, his lisinopril was stopped which he did stop but he was instructed to go upon amlodipine to 10 mg daily.  He  misunderstood and increased his amiodarone to 200 mg 2 tablets daily.  I have informed him to decrease amiodarone back to 200 mg 1-1/2 tablets daily and increase amlodipine to 10 mg daily.  He will follow up on November 23, 2010, with my nurse practitioner for blood pressure check.     Armanda Magic, M.D.     TT/MEDQ  D:  11/21/2010  T:  11/21/2010  Job:  [id number]  cc:   Quita Skye. Artis Flock, M.D.  Electronically Signed by Armanda Magic M.D. on 12/01/2010 08:57:27 AM

## 2011-02-07 LAB — MAGNESIUM: Magnesium: 1.9

## 2011-02-10 LAB — PROTIME-INR
INR: 1.9 — ABNORMAL HIGH
Prothrombin Time: 22.5 — ABNORMAL HIGH

## 2011-02-13 LAB — COMPREHENSIVE METABOLIC PANEL
ALT: 22
AST: 23
Albumin: 3.6
Calcium: 9.9
GFR calc Af Amer: 47 — ABNORMAL LOW
Sodium: 138
Total Protein: 6.5

## 2011-02-13 LAB — CBC
MCHC: 34
Platelets: 244
RDW: 14.6 — ABNORMAL HIGH

## 2011-02-13 LAB — PROTIME-INR
INR: 2 — ABNORMAL HIGH
Prothrombin Time: 23 — ABNORMAL HIGH
Prothrombin Time: 23.5 — ABNORMAL HIGH

## 2011-02-13 LAB — TSH: TSH: 0.764

## 2011-02-13 LAB — MAGNESIUM: Magnesium: 2

## 2011-02-16 LAB — CBC
HCT: 40.4
Hemoglobin: 13.2
Hemoglobin: 13.8
MCHC: 33.8
Platelets: 231
RBC: 4.2 — ABNORMAL LOW
RDW: 14.5 — ABNORMAL HIGH
WBC: 7

## 2011-02-16 LAB — BASIC METABOLIC PANEL
BUN: 28 — ABNORMAL HIGH
CO2: 23
Calcium: 9
Calcium: 9.1
Creatinine, Ser: 1.64 — ABNORMAL HIGH
GFR calc Af Amer: 51 — ABNORMAL LOW
GFR calc Af Amer: 51 — ABNORMAL LOW
GFR calc non Af Amer: 42 — ABNORMAL LOW
GFR calc non Af Amer: 43 — ABNORMAL LOW
Glucose, Bld: 112 — ABNORMAL HIGH
Potassium: 4
Sodium: 139
Sodium: 139

## 2011-02-16 LAB — PROTIME-INR
INR: 2.5 — ABNORMAL HIGH
Prothrombin Time: 28.7 — ABNORMAL HIGH

## 2011-10-05 ENCOUNTER — Other Ambulatory Visit: Payer: Self-pay | Admitting: Dermatology

## 2012-05-07 ENCOUNTER — Other Ambulatory Visit: Payer: Self-pay | Admitting: Nephrology

## 2012-05-07 DIAGNOSIS — N184 Chronic kidney disease, stage 4 (severe): Secondary | ICD-10-CM

## 2012-05-10 ENCOUNTER — Ambulatory Visit
Admission: RE | Admit: 2012-05-10 | Discharge: 2012-05-10 | Disposition: A | Discharge status: Home / Self Care | Payer: PRIVATE HEALTH INSURANCE | Source: Ambulatory Visit | Attending: Nephrology | Admitting: Nephrology

## 2012-05-10 DIAGNOSIS — N184 Chronic kidney disease, stage 4 (severe): Secondary | ICD-10-CM

## 2012-05-20 ENCOUNTER — Other Ambulatory Visit: Payer: Self-pay | Admitting: Urology

## 2012-05-20 DIAGNOSIS — N1330 Unspecified hydronephrosis: Secondary | ICD-10-CM

## 2012-05-21 ENCOUNTER — Ambulatory Visit (HOSPITAL_COMMUNITY)
Admission: RE | Admit: 2012-05-21 | Discharge: 2012-05-21 | Disposition: A | Discharge status: Home / Self Care | Payer: MEDICARE | Source: Ambulatory Visit | Attending: Urology | Admitting: Urology

## 2012-05-21 DIAGNOSIS — Z432 Encounter for attention to ileostomy: Secondary | ICD-10-CM | POA: Insufficient documentation

## 2012-05-21 DIAGNOSIS — C679 Malignant neoplasm of bladder, unspecified: Secondary | ICD-10-CM | POA: Insufficient documentation

## 2012-05-21 DIAGNOSIS — N1330 Unspecified hydronephrosis: Secondary | ICD-10-CM

## 2012-05-21 MED ORDER — DIATRIZOATE MEGLUMINE 30 % UR SOLN
Freq: Once | URETHRAL | Status: AC | PRN
  Administered 2012-05-21: 400 mL

## 2013-02-20 ENCOUNTER — Ambulatory Visit (INDEPENDENT_AMBULATORY_CARE_PROVIDER_SITE_OTHER): Payer: MEDICARE | Admitting: Pharmacist

## 2013-02-20 DIAGNOSIS — I4891 Unspecified atrial fibrillation: Secondary | ICD-10-CM

## 2013-02-20 LAB — POCT INR: INR: 2.7

## 2013-03-04 ENCOUNTER — Other Ambulatory Visit: Payer: Self-pay | Admitting: Cardiology

## 2013-04-03 ENCOUNTER — Ambulatory Visit (INDEPENDENT_AMBULATORY_CARE_PROVIDER_SITE_OTHER): Payer: MEDICARE | Admitting: *Deleted

## 2013-04-03 DIAGNOSIS — I4891 Unspecified atrial fibrillation: Secondary | ICD-10-CM

## 2013-04-03 LAB — POCT INR: INR: 2.2

## 2013-04-09 ENCOUNTER — Other Ambulatory Visit: Payer: Self-pay | Admitting: *Deleted

## 2013-04-09 MED ORDER — WARFARIN SODIUM 5 MG PO TABS: ORAL_TABLET | ORAL | Status: DC

## 2013-04-10 ENCOUNTER — Encounter: Payer: Self-pay | Admitting: *Deleted

## 2013-04-10 NOTE — Telephone Encounter (Signed)
This encounter was created in error - please disregard.

## 2013-04-17 ENCOUNTER — Other Ambulatory Visit: Payer: Self-pay | Admitting: Dermatology

## 2013-05-02 ENCOUNTER — Encounter: Payer: Self-pay | Admitting: General Surgery

## 2013-05-02 DIAGNOSIS — I951 Orthostatic hypotension: Secondary | ICD-10-CM | POA: Insufficient documentation

## 2013-05-02 DIAGNOSIS — I350 Nonrheumatic aortic (valve) stenosis: Secondary | ICD-10-CM

## 2013-05-15 ENCOUNTER — Ambulatory Visit (INDEPENDENT_AMBULATORY_CARE_PROVIDER_SITE_OTHER): Payer: MEDICARE | Admitting: Pharmacist

## 2013-05-15 DIAGNOSIS — I4891 Unspecified atrial fibrillation: Secondary | ICD-10-CM

## 2013-05-15 LAB — POCT INR: INR: 2.5

## 2013-05-26 ENCOUNTER — Ambulatory Visit (INDEPENDENT_AMBULATORY_CARE_PROVIDER_SITE_OTHER): Payer: MEDICARE | Admitting: Cardiology

## 2013-05-26 ENCOUNTER — Encounter: Payer: Self-pay | Admitting: Cardiology

## 2013-05-26 ENCOUNTER — Encounter: Payer: Self-pay | Admitting: General Surgery

## 2013-05-26 VITALS — BP 150/76 | HR 49 | Ht 68.5 in | Wt 185.8 lb

## 2013-05-26 DIAGNOSIS — I10 Essential (primary) hypertension: Secondary | ICD-10-CM

## 2013-05-26 DIAGNOSIS — I951 Orthostatic hypotension: Secondary | ICD-10-CM

## 2013-05-26 DIAGNOSIS — I350 Nonrheumatic aortic (valve) stenosis: Secondary | ICD-10-CM

## 2013-05-26 DIAGNOSIS — I5189 Other ill-defined heart diseases: Secondary | ICD-10-CM

## 2013-05-26 DIAGNOSIS — I4891 Unspecified atrial fibrillation: Secondary | ICD-10-CM

## 2013-05-26 DIAGNOSIS — I359 Nonrheumatic aortic valve disorder, unspecified: Secondary | ICD-10-CM

## 2013-05-26 DIAGNOSIS — I519 Heart disease, unspecified: Secondary | ICD-10-CM

## 2013-05-26 DIAGNOSIS — R001 Bradycardia, unspecified: Secondary | ICD-10-CM

## 2013-05-26 DIAGNOSIS — I498 Other specified cardiac arrhythmias: Secondary | ICD-10-CM

## 2013-05-26 LAB — HEPATIC FUNCTION PANEL
ALT: 21 U/L (ref 0–53)
AST: 29 U/L (ref 0–37)
Albumin: 3.7 g/dL (ref 3.5–5.2)
Alkaline Phosphatase: 54 U/L (ref 39–117)
Bilirubin, Direct: 0 mg/dL (ref 0.0–0.3)
TOTAL PROTEIN: 7.1 g/dL (ref 6.0–8.3)
Total Bilirubin: 0.5 mg/dL (ref 0.3–1.2)

## 2013-05-26 LAB — TSH: TSH: 3.79 u[IU]/mL (ref 0.35–5.50)

## 2013-05-26 NOTE — Progress Notes (Signed)
[name], [address] [place], [place]  [address] Phone: [phone] Fax:  [phone]  Date:  05/26/2013   ID:  [name] 04-05-1941, MRN [id number]  PCP:  Pcp Not In System  Cardiologist:  [name] Him, MD     History of Present Illness: [name] is a 73 y.o. male with a history of atrial fibrillation, HTN, HOCM, orthostatic hypotension and moderate AS.  He presents today for followup.  He had the flu over Christmas but has recovered from that but since then he says that he gets more DOE than before.  He has also noticed that his HR does not get up when he exercises like it used to.  He also notices lightheadedness sometimes when he is bowling.  He denies any chest pain.  He has chronic LE edema which is stable and actually has improved.  He denies any syncope.  He also says that if he sits too long in the chair or when he goes to lay down in bed, about 15 minutes into lying there his legs get pins and needles and go numb.  He also has had some back pain.   Wt Readings from Last 3 Encounters:  05/26/13 185 lb 12.8 oz (84.278 kg)  05/02/13 186 lb 3.2 oz (84.46 kg)  02/05/08 192 lb (87.091 kg)     Past Medical History  Diagnosis Date  . Paroxysmal atrial fibrillation     cardioversion -[name]  . Hypertension   . Warfarin anticoagulation   . Renal insufficiency     followed by [name] [name]  . Hypertrophic obstructive cardiomyopathy   . Bladder cancer   . Aortic stenosis, moderate   . Orthostatic hypotension     Current Outpatient Prescriptions  Medication Sig Dispense Refill  . amiodarone (PACERONE) 200 MG tablet Take 1.5 tablets (300 mg total) by mouth daily.  135 tablet  3  . amLODipine (NORVASC) 10 MG tablet Take 10 mg by mouth daily.      . B Complex-C (SUPER B COMPLEX PO) Take by mouth.      . calcitRIOL (ROCALTROL) 0.25 MCG capsule Take 0.25 mcg by mouth daily.      . cholecalciferol (VITAMIN D) 1000 UNITS tablet Take 1,000 Units by mouth daily.       . Cinnamon 500 MG capsule Take 1,000 mg by mouth daily.       . Glucosamine-Chondroit-Vit C-Mn (GLUCOSAMINE-CHONDROITIN) TABS Take 2 tablets by mouth daily.       . magnesium oxide (MAG-OX) 400 MG tablet Take 400 mg by mouth daily.      . Multiple Vitamins-Minerals (CENTRUM SILVER ULTRA MENS PO) Take by mouth. Take daily      . sodium bicarbonate 650 MG tablet Take 650 mg by mouth 4 (four) times daily. Take 2 tabs twice a day      . warfarin (COUMADIN) 5 MG tablet Take as directed by coumadin clinic  60 tablet  1  . Fish Oil-Cholecalciferol (FISH OIL + D3) 1000-1000 MG-UNIT CAPS Take by mouth. Take daily       No current facility-administered medications for this visit.    Allergies:   No Known Allergies  Social History:  The patient  reports that he has quit smoking. He does not have any smokeless tobacco history on file. He reports that he does not drink alcohol or use illicit drugs.   Family History:  The patient's family history includes Alzheimer's disease in his sister; Arrhythmia  in his father; CVA in his mother; Heart attack in his father; Heart disease in his father; Multiple sclerosis in his brother.   ROS:  Please see the history of present illness.      All other systems reviewed and negative.   PHYSICAL EXAM: VS:  BP 150/76  Pulse 49  Ht 5' 8.5" (1.74 m)  Wt 185 lb 12.8 oz (84.278 kg)  BMI 27.84 kg/m2  SpO2 96% Well nourished, well developed, in no acute distress HEENT: normal Neck: no JVD Cardiac:  normal S1, S2; RRR; no murmur Lungs:  clear to auscultation bilaterally, no wheezing, rhonchi or rales Abd: soft, nontender, no hepatomegaly Ext: no edema Skin: warm and dry Neuro:  CNs 2-12 intact, no focal abnormalities noted      ASSESSMENT AND PLAN:  1. Atrial Fibrillation- maintaining NSR  - continue amiodarone/warfarin   - check PFT's with DLCO  - check TSH/LFTs 2. HTN - borderline control  - I have asked [name] to check his BP daily for a week and call  -  continue amlodipine 3. HOCM 4. Moderate AS  - recheck echo in July 2015 5. Chronic anticoagulation 6. Dizziness - I suspect this is due to chronotropic incompetence  - I will get a 24 hour Holter and if average HR is in the 40's will walk [name] on the treadmill to see what his HR response is  Followup with me in 6 months Signed, [name] Him, MD 05/26/2013 9:23 AM

## 2013-05-26 NOTE — Patient Instructions (Addendum)
Your physician recommends that you continue on your current medications as directed. Please refer to the Current Medication list given to you today.  Your physician recommends that you go to the lab today for a TSH and Hepatic Panel  Your physician has recommended that you have a pulmonary function test. Pulmonary Function Tests are a group of tests that measure how well air moves in and out of your lungs.  Your physician has recommended that you wear a holter monitor. Holter monitors are medical devices that record the heart's electrical activity. Doctors most often use these monitors to diagnose arrhythmias. Arrhythmias are problems with the speed or rhythm of the heartbeat. The monitor is a small, portable device. You can wear one while you do your normal daily activities. This is usually used to diagnose what is causing palpitations/syncope (passing out).  Your physician has requested that you regularly monitor and record your blood pressure readings at home. Please use the same machine at the same time of day to check your readings and record them for a week. Please call us at the end of the week with the results.   Your physician wants you to follow-up in: 6 Months with Dr Mallie Snooks will receive a reminder letter in the mail two months in advance. If you don't receive a letter, please call our office to schedule the follow-up appointment.

## 2013-05-28 ENCOUNTER — Encounter: Payer: Self-pay | Admitting: *Deleted

## 2013-05-28 ENCOUNTER — Other Ambulatory Visit: Payer: Self-pay | Admitting: General Surgery

## 2013-05-28 ENCOUNTER — Encounter (INDEPENDENT_AMBULATORY_CARE_PROVIDER_SITE_OTHER): Payer: MEDICARE

## 2013-05-28 DIAGNOSIS — R001 Bradycardia, unspecified: Secondary | ICD-10-CM

## 2013-05-28 DIAGNOSIS — I498 Other specified cardiac arrhythmias: Secondary | ICD-10-CM

## 2013-05-28 DIAGNOSIS — R42 Dizziness and giddiness: Secondary | ICD-10-CM

## 2013-05-28 NOTE — Progress Notes (Signed)
Patient ID: [name], male   DOB: [birth year], 73 y.o.   MRN: [id number] E-Cardio 24 hour holter monitor applied to patient.

## 2013-06-02 ENCOUNTER — Telehealth: Payer: Self-pay | Admitting: Cardiology

## 2013-06-02 NOTE — Telephone Encounter (Signed)
lvm for pt to return call °

## 2013-06-02 NOTE — Telephone Encounter (Signed)
New Prob    Pt is calling regarding his BP readings. Please call.

## 2013-06-03 ENCOUNTER — Telehealth: Payer: Self-pay | Admitting: Cardiology

## 2013-06-03 NOTE — Telephone Encounter (Signed)
Please disregard earlier order for Hytrin.  Please start him on Hydralazine 10mg  BID and check BP daily for a week and call with results.  Cannot use ACE I or ARB due to CKD and he is maxed out on amlodipine.  He is too bradycardic for beta blocker.

## 2013-06-03 NOTE — Telephone Encounter (Signed)
BP still too high - please have patient start Hytrin 1mg  qhs and check BP daily for a week and call with results

## 2013-06-03 NOTE — Telephone Encounter (Signed)
Please let patient know that heart monitor showed an average HR of 49bpm with max HR 82bpm and min HR 38bpm during sleep.  Please set up for an ETT to assess chronotropic response to determine if slow HR is making him feel  dizzy

## 2013-06-03 NOTE — Telephone Encounter (Signed)
Bp Readings for pt  Mon 150/80 (In Office) Tues 143/94 P 55 Weds 165/84 P 54 Thurs 160/74 P 56 Fri 153/79 P 53 Sat 153/83 P 54 Sun 147/86 P 55 Mon 137/81 P 56  Pt stated the ones that are in the 160s have been after being active in the morning.

## 2013-06-04 ENCOUNTER — Other Ambulatory Visit: Payer: Self-pay | Admitting: General Surgery

## 2013-06-04 DIAGNOSIS — R001 Bradycardia, unspecified: Secondary | ICD-10-CM

## 2013-06-04 MED ORDER — HYDRALAZINE HCL 10 MG PO TABS
10.0000 mg | ORAL_TABLET | Freq: Two times a day (BID) | ORAL | Status: DC
Start: 2013-06-04 — End: 2013-06-30

## 2013-06-04 NOTE — Telephone Encounter (Signed)
ordered

## 2013-06-04 NOTE — Telephone Encounter (Signed)
bradycardia

## 2013-06-04 NOTE — Addendum Note (Signed)
Addended by: Lily Kocher on: 06/04/2013 10:29 AM   Modules accepted: Orders

## 2013-06-04 NOTE — Telephone Encounter (Signed)
Pt is aware. Rx ordered for pt.

## 2013-06-04 NOTE — Telephone Encounter (Signed)
New Rx sent in for pt. Pt is aware. Paper work filled out for ETT. Dr Radford Pax do you want me to use dizziness as the Dx code for ETT?

## 2013-06-05 ENCOUNTER — Telehealth: Payer: Self-pay | Admitting: Cardiology

## 2013-06-05 NOTE — Telephone Encounter (Signed)
New Problem:  Pt called in to schedule his ETT... Pt wants to know if Dr. Radford Pax wanted him to wait as far out as he is scheduled. As far as I could see I gave the pt the soonest appt w/ Dr. Ferdinand Cava.Marland KitchenMarland Kitchen

## 2013-06-05 NOTE — Telephone Encounter (Signed)
I spoke with Erlene Quan in scheduling & it looked like based on availability with the treadmill room this would be the soonest appointment. The notes surrounding this order would seem to present a sooner appointment & have asked if there is another location this could be performed within our system  I have called pt & talked with him about our working on a sooner date than 07/16/13. He is appreciative as he felt like Dr. Radford Pax thought this needed to be done sooner than later date as described in 06/03/13 telephone note.  Scheduling is working on sooner date & will call pt back. Horton Chin RN

## 2013-06-12 ENCOUNTER — Ambulatory Visit (HOSPITAL_COMMUNITY)
Admission: RE | Admit: 2013-06-12 | Discharge: 2013-06-12 | Disposition: A | Discharge status: Home / Self Care | Payer: MEDICARE | Source: Ambulatory Visit | Attending: Internal Medicine | Admitting: Internal Medicine

## 2013-06-12 DIAGNOSIS — I498 Other specified cardiac arrhythmias: Secondary | ICD-10-CM | POA: Insufficient documentation

## 2013-06-12 DIAGNOSIS — R001 Bradycardia, unspecified: Secondary | ICD-10-CM

## 2013-06-18 ENCOUNTER — Telehealth: Payer: Self-pay | Admitting: Cardiology

## 2013-06-18 NOTE — Telephone Encounter (Signed)
LVM for pt to return call

## 2013-06-18 NOTE — Telephone Encounter (Signed)
New message ° ° ° ° ° °Returning [name]'s call °

## 2013-06-18 NOTE — Telephone Encounter (Signed)
Patient had poor exercise tolerance of ETT with chronotropic incompetance. EKG showed junctional bradycardia and HR only increased to 90's with exercise with drop in BP with exercise. Please refer to EP for evaluation

## 2013-06-19 NOTE — Telephone Encounter (Signed)
Pt is aware.  

## 2013-06-25 ENCOUNTER — Institutional Professional Consult (permissible substitution): Payer: PRIVATE HEALTH INSURANCE | Admitting: Internal Medicine

## 2013-06-25 ENCOUNTER — Ambulatory Visit (INDEPENDENT_AMBULATORY_CARE_PROVIDER_SITE_OTHER): Payer: MEDICARE | Admitting: *Deleted

## 2013-06-25 DIAGNOSIS — I4891 Unspecified atrial fibrillation: Secondary | ICD-10-CM

## 2013-06-25 LAB — POCT INR: INR: 2.5

## 2013-06-30 ENCOUNTER — Encounter: Payer: Self-pay | Admitting: Internal Medicine

## 2013-06-30 ENCOUNTER — Ambulatory Visit (INDEPENDENT_AMBULATORY_CARE_PROVIDER_SITE_OTHER): Payer: MEDICARE | Admitting: Internal Medicine

## 2013-06-30 VITALS — BP 160/88 | HR 56 | Ht 68.5 in | Wt 178.2 lb

## 2013-06-30 DIAGNOSIS — I4589 Other specified conduction disorders: Secondary | ICD-10-CM

## 2013-06-30 DIAGNOSIS — I421 Obstructive hypertrophic cardiomyopathy: Secondary | ICD-10-CM

## 2013-06-30 DIAGNOSIS — I498 Other specified cardiac arrhythmias: Secondary | ICD-10-CM

## 2013-06-30 DIAGNOSIS — I4891 Unspecified atrial fibrillation: Secondary | ICD-10-CM

## 2013-06-30 DIAGNOSIS — I350 Nonrheumatic aortic (valve) stenosis: Secondary | ICD-10-CM

## 2013-06-30 DIAGNOSIS — R001 Bradycardia, unspecified: Secondary | ICD-10-CM

## 2013-06-30 DIAGNOSIS — I359 Nonrheumatic aortic valve disorder, unspecified: Secondary | ICD-10-CM

## 2013-06-30 MED ORDER — AMIODARONE HCL 200 MG PO TABS: 200.0000 mg | ORAL_TABLET | Freq: Every day | ORAL | Status: DC

## 2013-06-30 NOTE — Patient Instructions (Addendum)
Your physician has recommended you make the following change in your medication:  1) Stop Hydralazine 2) Decrease Amiodarone to 200 mg daily  Your physician recommends that you schedule a follow-up appointment in: 6 weeks with Dr. Lovena Le.

## 2013-06-30 NOTE — Progress Notes (Signed)
HPI [name] is referred today by Dr. Radford Pax for evaluation of bradycardia. He has a long h/o atrial fibrillation and has been on fairly high dose amiodarone (300 daily). He also has a h/o hypertrophic CM, moderate aortic stenosis, and bladder cancer with indwelling ureterostomy. The patient has never had syncope. He does have evidence of chronotropic incompetence and a cardiac monitor demonstrated an average heart rate of 50/min. He plays golf and notes increasing fatigue and weakness making his golf game more difficult to play. He was recently begun on Hydralazine and notes that since then, his BM's have increased in frequency and volume. He walked approximately 4 min on a [employer] treadmill testing and had evidence of impaired heart rate response.  No Known Allergies   Current Outpatient Prescriptions  Medication Sig Dispense Refill  . amiodarone (PACERONE) 200 MG tablet Take 1 tablet (200 mg total) by mouth daily.  30 tablet  3  . amLODipine (NORVASC) 10 MG tablet Take 10 mg by mouth daily.      . B Complex-C (SUPER B COMPLEX PO) Take 1 capsule by mouth daily.       . calcitRIOL (ROCALTROL) 0.25 MCG capsule Take 0.25 mcg by mouth daily.      . cholecalciferol (VITAMIN D) 1000 UNITS tablet Take 1,000 Units by mouth daily.      . Cinnamon 500 MG capsule Take 1,000 mg by mouth daily.       . Fish Oil-Cholecalciferol (FISH OIL + D3) 1000-1000 MG-UNIT CAPS Take 1 capsule by mouth daily.       . Glucosamine-Chondroit-Vit C-Mn (GLUCOSAMINE-CHONDROITIN) TABS Take 2 tablets by mouth daily.       . magnesium oxide (MAG-OX) 400 MG tablet Take 400 mg by mouth daily.      . Multiple Vitamins-Minerals (CENTRUM SILVER ULTRA MENS PO) Take by mouth. Take daily      . sodium bicarbonate 650 MG tablet Take 650 mg by mouth 4 (four) times daily. Take 2 tabs twice a day      . warfarin (COUMADIN) 5 MG tablet Take as directed by coumadin clinic  60 tablet  1   No current facility-administered medications  for this visit.     Past Medical History  Diagnosis Date  . Paroxysmal atrial fibrillation     cardioversion 11/21/10 - Dr Radford Pax  . Hypertension   . Warfarin anticoagulation   . Renal insufficiency     followed by Dr Jimmy Footman  . Hypertrophic obstructive cardiomyopathy   . Bladder cancer   . Aortic stenosis, moderate     echo 10/2012  . Orthostatic hypotension   . Diastolic dysfunction     ROS:   All systems reviewed and negative except as noted in the HPI.   Past Surgical History  Procedure Laterality Date  . Cardioversion  11/21/2010  . Cystectomy    . Cholecystectomy       Family History  Problem Relation Age of Onset  . CVA Mother   . Heart attack Father   . Heart disease Father   . Arrhythmia Father   . Alzheimer's disease Sister   . Multiple sclerosis Brother   . Alcohol abuse Father      History   Social History  . Marital Status: Married    Spouse Name: N/A    Number of Children: N/A  . Years of Education: N/A   Occupational History  . Not on file.   Social History Main Topics  .  Smoking status: Former Research scientist (life sciences)  . Smokeless tobacco: Not on file  . Alcohol Use: No  . Drug Use: No  . Sexual Activity: Not on file   Other Topics Concern  . Not on file   Social History Narrative  . No narrative on file     BP 160/88  Pulse 56  Ht 5' 8.5" (1.74 m)  Wt 178 lb 3.2 oz (80.831 kg)  BMI 26.70 kg/m2  Physical Exam:  Well appearing 73 yo man NAD HEENT: Unremarkable Neck:  7 cm JVD, no thyromegally Back:  No CVA tenderness Lungs:  Clear with no wheezes HEART:  Regular rate rhythm, no murmurs, no rubs, no clicks Abd:  soft, positive bowel sounds, no organomegally, no rebound, no guarding Ext:  2 plus pulses, no edema, no cyanosis, no clubbing Skin:  No rashes no nodules Neuro:  CN II through XII intact, motor grossly intact  EKG - sinus bradycardia at 56/min.   Assess/Plan:

## 2013-06-30 NOTE — Assessment & Plan Note (Signed)
It is unclear as to whether this is causing much of his symptoms. He will need to have this followed closely.

## 2013-06-30 NOTE — Assessment & Plan Note (Signed)
Hopefully he will be able to maintain NSR on a lower dose of amiodarone.

## 2013-06-30 NOTE — Assessment & Plan Note (Signed)
It is unclear to me how symptomatic he is regarding his sinus bradycardia. I have asked the patient to reduce his dose of amiodarone to 200 mg daily. Will follow him clinically.

## 2013-07-14 ENCOUNTER — Ambulatory Visit (INDEPENDENT_AMBULATORY_CARE_PROVIDER_SITE_OTHER): Payer: MEDICARE | Admitting: Internal Medicine

## 2013-07-14 DIAGNOSIS — I4891 Unspecified atrial fibrillation: Secondary | ICD-10-CM

## 2013-07-14 LAB — PULMONARY FUNCTION TEST
DL/VA % PRED: 47 %
DL/VA: 2.11 ml/min/mmHg/L
DLCO unc % pred: 43 %
DLCO unc: 12.41 ml/min/mmHg
FEF 25-75 Post: 1.92 L/sec
FEF 25-75 Pre: 1.95 L/sec
FEF2575-%CHANGE-POST: -1 %
FEF2575-%PRED-PRE: 95 %
FEF2575-%Pred-Post: 94 %
FEV1-%CHANGE-POST: 2 %
FEV1-%Pred-Post: 115 %
FEV1-%Pred-Pre: 113 %
FEV1-POST: 3.2 L
FEV1-PRE: 3.12 L
FEV1FVC-%CHANGE-POST: 2 %
FEV1FVC-%Pred-Pre: 93 %
FEV6-%CHANGE-POST: 0 %
FEV6-%Pred-Post: 126 %
FEV6-%Pred-Pre: 126 %
FEV6-PRE: 4.5 L
FEV6-Post: 4.5 L
FEV6FVC-%Change-Post: 0 %
FEV6FVC-%PRED-PRE: 105 %
FEV6FVC-%Pred-Post: 105 %
FVC-%Change-Post: 0 %
FVC-%Pred-Post: 120 %
FVC-%Pred-Pre: 120 %
FVC-Post: 4.59 L
FVC-Pre: 4.59 L
POST FEV1/FVC RATIO: 70 %
POST FEV6/FVC RATIO: 98 %
Pre FEV1/FVC ratio: 68 %
Pre FEV6/FVC Ratio: 98 %
RV % pred: 53 %
RV: 1.25 L
TLC % PRED: 93 %
TLC: 6.03 L

## 2013-07-14 NOTE — Progress Notes (Signed)
PFT done today. 

## 2013-07-15 ENCOUNTER — Telehealth: Payer: Self-pay | Admitting: Cardiology

## 2013-07-15 DIAGNOSIS — R942 Abnormal results of pulmonary function studies: Secondary | ICD-10-CM

## 2013-07-15 NOTE — Telephone Encounter (Signed)
Referral sent 

## 2013-07-15 NOTE — Telephone Encounter (Signed)
Message copied by Alcario Drought on Tue Jul 15, 2013  3:39 PM ------      Message from: Fransico Him R      Created: Tue Jul 15, 2013 10:46 AM       severly reduced DLCO which may be due to Amiodarone - please refer to Pulmonary for eval ------

## 2013-07-16 ENCOUNTER — Encounter: Payer: PRIVATE HEALTH INSURANCE | Admitting: Cardiology

## 2013-07-17 ENCOUNTER — Ambulatory Visit (INDEPENDENT_AMBULATORY_CARE_PROVIDER_SITE_OTHER): Payer: MEDICARE | Admitting: Internal Medicine

## 2013-07-17 ENCOUNTER — Encounter: Payer: Self-pay | Admitting: Internal Medicine

## 2013-07-17 ENCOUNTER — Ambulatory Visit (INDEPENDENT_AMBULATORY_CARE_PROVIDER_SITE_OTHER)
Admission: RE | Admit: 2013-07-17 | Discharge: 2013-07-17 | Disposition: A | Discharge status: Home / Self Care | Payer: MEDICARE | Source: Ambulatory Visit | Attending: Internal Medicine | Admitting: Internal Medicine

## 2013-07-17 VITALS — BP 112/64 | HR 60 | Temp 97.8°F | Ht 68.0 in | Wt 170.0 lb

## 2013-07-17 DIAGNOSIS — R0600 Dyspnea, unspecified: Secondary | ICD-10-CM

## 2013-07-17 DIAGNOSIS — R0609 Other forms of dyspnea: Secondary | ICD-10-CM

## 2013-07-17 DIAGNOSIS — R0989 Other specified symptoms and signs involving the circulatory and respiratory systems: Secondary | ICD-10-CM

## 2013-07-17 DIAGNOSIS — I5032 Chronic diastolic (congestive) heart failure: Secondary | ICD-10-CM | POA: Insufficient documentation

## 2013-07-17 NOTE — Patient Instructions (Signed)
GERD (REFLUX)  is an extremely common cause of respiratory symptoms, many times with no significant heartburn at all.    It can be treated with medication, but also with lifestyle changes including avoidance of late meals, excessive alcohol, smoking cessation, and avoid fatty foods, chocolate, peppermint, colas, red wine, and acidic juices such as orange juice.  NO MINT OR MENTHOL PRODUCTS SO NO COUGH DROPS  USE SUGARLESS CANDY INSTEAD (jolley ranchers or Stover's)  NO OIL BASED VITAMINS - use powdered substitutes.    Please remember to go to the lab and x-ray department downstairs for your tests - we will call you with the results when they are available.    Please schedule a follow up office visit in 6 weeks, call sooner if needed

## 2013-07-17 NOTE — Progress Notes (Signed)
   Subjective:    Patient ID: [name], male    DOB: 09-13-1940   MRN: [id number]  HPI  37 yowm quit smoking 1985 with some cough resolved completely then onset of doe x 2011 referred Dr Loreta Ave 07/17/2013 to pulmonary clinic   07/17/2013 1st [facility] office visit/ Ryelee Albee re indololent onset  Min progressive mild   x 3 y Chief Complaint  Patient presents with  . Pulmonary Consult    Referred per Dr. Ollen Gross. Pt c/o SOB for the past 2 yrs. He gets SOB walking up steps and with minimal exertion such as making his bed.   wears out when walking flat like at [employer]/ across a big parking lot   But can treadmill #3 slow x flat x 40m, intermittent cough variably worse in winter and in am's min mucoid sputum.  No obvious other patterns in day to day or daytime variabilty or assoc   cp or chest tightness, subjective wheeze overt sinus or hb symptoms. No unusual exp hx or h/o childhood pna/ asthma or knowledge of premature birth.  Sleeping ok without nocturnal  or early am exacerbation  of respiratory  c/o's or need for noct saba. Also denies any obvious fluctuation of symptoms with weather or environmental changes or other aggravating or alleviating factors except as outlined above   Current Medications, Allergies, Complete Past Medical History, Past Surgical History, Family History, and Social History were reviewed in [employer] record.                Review of Systems  Constitutional: Negative for fever, chills, activity change, appetite change and unexpected weight change.  HENT: Positive for sneezing. Negative for congestion, dental problem, postnasal drip, rhinorrhea, sore throat, trouble swallowing and voice change.   Eyes: Negative for visual disturbance.  Respiratory: Positive for cough and shortness of breath. Negative for choking.   Cardiovascular: Negative for chest pain and leg swelling.  Gastrointestinal: Negative for nausea, vomiting  and abdominal pain.  Genitourinary: Negative for difficulty urinating.  Musculoskeletal: Negative for arthralgias.  Skin: Negative for rash.  Psychiatric/Behavioral: Negative for behavioral problems and confusion.       Objective:   Physical Exam  Wt Readings from Last 3 Encounters:  07/17/13 170 lb (77.111 kg)  06/30/13 178 lb 3.2 oz (80.831 kg)  05/26/13 185 lb 12.8 oz (84.278 kg)     HEENT: edentulous, nl  turbinates, and orophanx. Nl external ear canals without cough reflex   NECK :  without JVD/Nodes/TM/ nl carotid upstrokes bilaterally   LUNGS: no acc muscle use,   without cough on insp or exp maneuvers/ minimal pseudowheeze R > L   CV:  RRR  no s3 or murmur or increase in P2, no edema   ABD:  soft and nontender with nl excursion in the supine position. No bruits or organomegaly, bowel sounds nl  MS:  warm without deformities, calf tenderness, cyanosis or clubbing  SKIN: warm and dry without lesions    NEURO:  alert, approp, no deficits    CXR  07/17/2013 :  No active cardiopulmonary disease. Emphysema  Labs ordered 07/17/13 > did not go to lab as requested .      Assessment & Plan:

## 2013-07-18 NOTE — Progress Notes (Signed)
Quick Note:  ATC, NA and no option to leave a msg, WCB ______ 

## 2013-07-19 NOTE — Assessment & Plan Note (Signed)
-  PFT s 07/14/13  Min airflow obst, DLCO  43% corrects to 47  - 07/17/2013  Walked RA x 3 laps @ 185 ft each stopped due to end of study, lowest sat 88 but then increased before stopped  When respiratory symptoms begin   well after a patient reports complete smoking cessation,  Especially when this wasn't the case while they were smoking and the FEV1 is still well preserved, as is the case here) a red flag is raised based on the work of Dr Kris Mouton which states:  if you quit smoking when your best day FEV1 is still well preserved it is highly unlikely you will progress to severe disease.  That is to say, once the smoking stops,  the symptoms should not suddenly erupt or markedly worsen.  If so, the differential diagnosis should include  obesity/deconditioning,  LPR/Reflux/Aspiration syndromes,  occult CHF, or  especially side effect of medications commonly used in this population ie amiodarone - so I share concern with Dr Radford Pax this could be very early sign of amio toxicity noting that the mild emphysematous changes on cxr mask ILD when mild and would strongly consider alternative rx options.  In meantime needs baseline esr and will ask pt to return in 6 weeks for another walking sat

## 2013-07-21 ENCOUNTER — Telehealth: Payer: Self-pay | Admitting: *Deleted

## 2013-07-21 NOTE — Telephone Encounter (Signed)
Pt returned call.  [name] ° °

## 2013-07-21 NOTE — Telephone Encounter (Signed)
Pt is aware. Nothing further needed 

## 2013-07-21 NOTE — Progress Notes (Signed)
Quick Note:  LMTCB ______ 

## 2013-07-21 NOTE — Telephone Encounter (Signed)
LMTCB

## 2013-07-21 NOTE — Telephone Encounter (Signed)
Message copied by Rosana Berger on Mon Jul 21, 2013 11:24 AM ------      Message from: Tanda Rockers      Created: Sat Jul 19, 2013 11:21 AM       Needs to return for labs at his convenience otherwise can't complete the w/u  ------

## 2013-07-22 ENCOUNTER — Other Ambulatory Visit (INDEPENDENT_AMBULATORY_CARE_PROVIDER_SITE_OTHER): Payer: MEDICARE

## 2013-07-22 DIAGNOSIS — R0609 Other forms of dyspnea: Secondary | ICD-10-CM

## 2013-07-22 DIAGNOSIS — R0600 Dyspnea, unspecified: Secondary | ICD-10-CM

## 2013-07-22 DIAGNOSIS — R0989 Other specified symptoms and signs involving the circulatory and respiratory systems: Secondary | ICD-10-CM

## 2013-07-22 LAB — CBC WITH DIFFERENTIAL/PLATELET
BASOS PCT: 0.3 % (ref 0.0–3.0)
Basophils Absolute: 0 10*3/uL (ref 0.0–0.1)
EOS PCT: 1.2 % (ref 0.0–5.0)
Eosinophils Absolute: 0.1 10*3/uL (ref 0.0–0.7)
HEMATOCRIT: 35 % — AB (ref 39.0–52.0)
HEMOGLOBIN: 11.6 g/dL — AB (ref 13.0–17.0)
LYMPHS ABS: 1.6 10*3/uL (ref 0.7–4.0)
LYMPHS PCT: 15.7 % (ref 12.0–46.0)
MCHC: 33.2 g/dL (ref 30.0–36.0)
MCV: 94.8 fl (ref 78.0–100.0)
MONOS PCT: 4.6 % (ref 3.0–12.0)
Monocytes Absolute: 0.5 10*3/uL (ref 0.1–1.0)
NEUTROS ABS: 7.8 10*3/uL — AB (ref 1.4–7.7)
Neutrophils Relative %: 78.2 % — ABNORMAL HIGH (ref 43.0–77.0)
Platelets: 334 10*3/uL (ref 150.0–400.0)
RBC: 3.69 Mil/uL — AB (ref 4.22–5.81)
RDW: 15 % — ABNORMAL HIGH (ref 11.5–14.6)
WBC: 9.9 10*3/uL (ref 4.5–10.5)

## 2013-07-22 LAB — BASIC METABOLIC PANEL
BUN: 51 mg/dL — ABNORMAL HIGH (ref 6–23)
CHLORIDE: 104 meq/L (ref 96–112)
CO2: 25 mEq/L (ref 19–32)
Calcium: 9.2 mg/dL (ref 8.4–10.5)
Creatinine, Ser: 4.5 mg/dL (ref 0.4–1.5)
GFR: 13.78 mL/min — CL (ref >60.00)
Glucose, Bld: 121 mg/dL — ABNORMAL HIGH (ref 70–99)
POTASSIUM: 4.9 meq/L (ref 3.5–5.1)
Sodium: 136 mEq/L (ref 135–145)

## 2013-07-22 LAB — BRAIN NATRIURETIC PEPTIDE: PRO B NATRI PEPTIDE: 580 pg/mL — AB (ref 0.0–100.0)

## 2013-07-22 LAB — SEDIMENTATION RATE: SED RATE: 61 mm/h — AB (ref 0–22)

## 2013-07-22 NOTE — Progress Notes (Signed)
Quick Note:  Spoke with pt and notified of results per Dr. Wert. Pt verbalized understanding and denied any questions.  ______ 

## 2013-07-23 ENCOUNTER — Other Ambulatory Visit: Payer: Self-pay | Admitting: General Surgery

## 2013-07-23 ENCOUNTER — Telehealth: Payer: Self-pay | Admitting: General Surgery

## 2013-07-23 NOTE — Telephone Encounter (Signed)
LVM for pt to return call

## 2013-07-23 NOTE — Telephone Encounter (Signed)
Pt is aware.  

## 2013-07-23 NOTE — Telephone Encounter (Signed)
Message copied by Lily Kocher on Wed Jul 23, 2013  2:32 PM ------      Message from: Fransico Him R      Created: Wed Jul 23, 2013  2:17 PM       Please let patient know that Dr. Melvyn Novas is concerned that he may be having some effects on his lungs from the Amiodarone.  Please have him stop the amiodarone and see me back in 4 weeks      ----- Message -----         From: Tanda Rockers, MD         Sent: 07/22/2013   4:09 PM           To: Sueanne Margarita, MD            ESR is up some so might consider d/c amio if feasible            The main finding though is much worse renal fxn > referred to renal       ------

## 2013-08-07 ENCOUNTER — Ambulatory Visit (INDEPENDENT_AMBULATORY_CARE_PROVIDER_SITE_OTHER): Payer: MEDICARE | Admitting: Pharmacist

## 2013-08-07 DIAGNOSIS — I4891 Unspecified atrial fibrillation: Secondary | ICD-10-CM

## 2013-08-07 LAB — POCT INR: INR: 2.5

## 2013-08-14 ENCOUNTER — Ambulatory Visit (INDEPENDENT_AMBULATORY_CARE_PROVIDER_SITE_OTHER): Payer: MEDICARE | Admitting: Internal Medicine

## 2013-08-14 ENCOUNTER — Encounter: Payer: Self-pay | Admitting: Internal Medicine

## 2013-08-14 ENCOUNTER — Ambulatory Visit (INDEPENDENT_AMBULATORY_CARE_PROVIDER_SITE_OTHER): Payer: MEDICARE | Admitting: Cardiology

## 2013-08-14 ENCOUNTER — Encounter: Payer: Self-pay | Admitting: Cardiology

## 2013-08-14 VITALS — BP 157/87 | HR 52 | Wt 176.0 lb

## 2013-08-14 VITALS — BP 157/87 | HR 52 | Ht 68.0 in | Wt 176.0 lb

## 2013-08-14 DIAGNOSIS — I10 Essential (primary) hypertension: Secondary | ICD-10-CM

## 2013-08-14 DIAGNOSIS — I4891 Unspecified atrial fibrillation: Secondary | ICD-10-CM | POA: Diagnosis not present

## 2013-08-14 DIAGNOSIS — I359 Nonrheumatic aortic valve disorder, unspecified: Secondary | ICD-10-CM

## 2013-08-14 DIAGNOSIS — I498 Other specified cardiac arrhythmias: Secondary | ICD-10-CM

## 2013-08-14 DIAGNOSIS — I5189 Other ill-defined heart diseases: Secondary | ICD-10-CM

## 2013-08-14 DIAGNOSIS — I519 Heart disease, unspecified: Secondary | ICD-10-CM | POA: Diagnosis not present

## 2013-08-14 DIAGNOSIS — I421 Obstructive hypertrophic cardiomyopathy: Secondary | ICD-10-CM

## 2013-08-14 DIAGNOSIS — R001 Bradycardia, unspecified: Secondary | ICD-10-CM

## 2013-08-14 DIAGNOSIS — I350 Nonrheumatic aortic (valve) stenosis: Secondary | ICD-10-CM

## 2013-08-14 NOTE — Progress Notes (Signed)
HPI [name] returns today for ongoing evaluation and management of symptomatic bradycardia and atrial fibrillation. When I saw him last several weeks ago he had clear chronotropic incompetence with an average heart rate of 50/min over 24 hours. He was on 400 mg of amio and we reduced his dose which was then discontinued. The patient has felt better. He is back to playing golf and does well except for climbing up hills. He is able to walk on the treadmill but with no incline. He is improved and has had no recurrent atrial fib yet.  No Known Allergies   Current Outpatient Prescriptions  Medication Sig Dispense Refill  . amLODipine (NORVASC) 10 MG tablet Take 10 mg by mouth daily.      . B Complex-C (SUPER B COMPLEX PO) Take 1 capsule by mouth daily.       . calcitRIOL (ROCALTROL) 0.25 MCG capsule Take 0.25 mcg by mouth daily.      . cholecalciferol (VITAMIN D) 1000 UNITS tablet Take 1,000 Units by mouth daily.      . Cinnamon 500 MG capsule Take 1,000 mg by mouth daily.       . Fish Oil-Cholecalciferol (FISH OIL + D3) 1000-1000 MG-UNIT CAPS Take 1 capsule by mouth daily.       . Glucosamine-Chondroit-Vit C-Mn (GLUCOSAMINE-CHONDROITIN) TABS Take 2 tablets by mouth daily.       . magnesium oxide (MAG-OX) 400 MG tablet Take 400 mg by mouth daily.      . Multiple Vitamins-Minerals (CENTRUM SILVER ULTRA MENS PO) Take by mouth. Take daily      . sodium bicarbonate 650 MG tablet Take 650 mg by mouth 4 (four) times daily. Take 2 tabs twice a day      . warfarin (COUMADIN) 5 MG tablet Take as directed by coumadin clinic  60 tablet  1   No current facility-administered medications for this visit.     Past Medical History  Diagnosis Date  . Paroxysmal atrial fibrillation     cardioversion 11/21/10 - Dr Radford Pax  . Hypertension   . Warfarin anticoagulation   . Renal insufficiency     followed by Dr Jimmy Footman  . Hypertrophic obstructive cardiomyopathy   . Bladder cancer   . Aortic  stenosis, moderate     echo 10/2012  . Orthostatic hypotension   . Diastolic dysfunction     ROS:   All systems reviewed and negative except as noted in the HPI.   Past Surgical History  Procedure Laterality Date  . Cardioversion  11/21/2010  . Cystectomy    . Cholecystectomy       Family History  Problem Relation Age of Onset  . CVA Mother   . Heart attack Father   . Heart disease Father   . Arrhythmia Father   . Alzheimer's disease Sister   . Multiple sclerosis Brother   . Alcohol abuse Father      History   Social History  . Marital Status: Married    Spouse Name: N/A    Number of Children: N/A  . Years of Education: N/A   Occupational History  . Not on file.   Social History Main Topics  . Smoking status: Former Smoker -- 2.00 packs/day for 33 years    Types: Cigarettes    Quit date: 05/02/1983  . Smokeless tobacco: Never Used  . Alcohol Use: No  . Drug Use: No  . Sexual Activity: Not on file   Other Topics  Concern  . Not on file   Social History Narrative  . No narrative on file     BP 157/87  Pulse 52  Ht 5\' 8"  (1.727 m)  Wt 176 lb (79.833 kg)  BMI 26.77 kg/m2  Physical Exam:  Well appearing 73 yo man NAD HEENT: Unremarkable Neck:  7 cm JVD, no thyromegally Back:  No CVA tenderness Lungs:  Clear with no wheezes HEART:  Regular rate rhythm, no murmurs, no rubs, no clicks Abd:  soft, positive bowel sounds, no organomegally, no rebound, no guarding Ext:  2 plus pulses, no edema, no cyanosis, no clubbing Skin:  No rashes no nodules Neuro:  CN II through XII intact, motor grossly intact  EKG - sinus bradycardia at 56/min.   Assess/Plan:

## 2013-08-14 NOTE — Assessment & Plan Note (Signed)
He currently has no evidence for the need for a PPM. Will follow.

## 2013-08-14 NOTE — Assessment & Plan Note (Signed)
He is currently maintaining NSR. If he goes back into atrial fibrillation, would consider Tikosyn if over 3 months since stopping amiodarone.

## 2013-08-14 NOTE — Patient Instructions (Signed)
Your physician wants you to follow-up in: 6 months with DR.Turner You will receive a reminder letter in the mail two months in advance. If you don't receive a letter, please call our office to schedule the follow-up appointment.  Your physician recommends that you continue on your current medications as directed. Please refer to the Current Medication list given to you today.  

## 2013-08-14 NOTE — Progress Notes (Signed)
[place], [place], [place]  [address] Phone: [phone] Fax:  [phone]  Date:  08/14/2013   ID:  [name], [name] Apr 22, 1941, MRN [id number]  PCP:  Pcp Not In System  Cardiologist:  Fransico Him, MD     History of Present Illness: [name] is a 73 y.o. male with a history of atrial fibrillation, HTN, HOCM, orthostatic hypotension and moderate AS. He presents today for followup. When I saw him in  January he had had the flu over Christmas and had recovered from it but since then he says that he gets more DOE than before. He also complained that his HR would not get up when he exercised like it used to. He also noticed lightheadedness sometimes when he was bowling. A 24 Hour Holter monitor was done which showed an averge HR of 49bpm with max HR 82bpm and min HR 38bpm.  He underwent ETT to assess chronotropic response to exercise which showed baseline junctional bradycardia and HR only increased to 90bpm with exercise with a drop in BP.  He was referred to EP and was seen by Dr. Lovena Le.  His Amio was decreased to 200mg  daily and he presents back today for followup.  His HR on EKG this am with Dr. Lovena Le is 52bpm with sinus bradycardia.  Ultimately his amio was stopped due to severely reduced DLCO.  Since stopping the amio  he has noticed an improvement in his fatigue and is able to do his daily activities and is now able to get back on the treadmill.  The SOB is still present but much improved.  He denies any chest pain.    Wt Readings from Last 3 Encounters:  08/14/13 176 lb (79.833 kg)  08/14/13 176 lb (79.833 kg)  07/17/13 170 lb (77.111 kg)     Past Medical History  Diagnosis Date  . Paroxysmal atrial fibrillation     cardioversion 11/21/10 - Dr Radford Pax  . Hypertension   . Warfarin anticoagulation   . Renal insufficiency     followed by Dr Jimmy Footman  . Hypertrophic obstructive cardiomyopathy   . Bladder cancer   . Aortic stenosis, moderate     echo 10/2012   . Orthostatic hypotension   . Diastolic dysfunction     Current Outpatient Prescriptions  Medication Sig Dispense Refill  . amLODipine (NORVASC) 10 MG tablet Take 10 mg by mouth daily.      . B Complex-C (SUPER B COMPLEX PO) Take 1 capsule by mouth daily.       . calcitRIOL (ROCALTROL) 0.25 MCG capsule Take 0.25 mcg by mouth daily.      . cholecalciferol (VITAMIN D) 1000 UNITS tablet Take 1,000 Units by mouth daily.      . Cinnamon 500 MG capsule Take 1,000 mg by mouth daily.       . Fish Oil-Cholecalciferol (FISH OIL + D3) 1000-1000 MG-UNIT CAPS Take 1 capsule by mouth daily.       . Glucosamine-Chondroit-Vit C-Mn (GLUCOSAMINE-CHONDROITIN) TABS Take 2 tablets by mouth daily.       . magnesium oxide (MAG-OX) 400 MG tablet Take 400 mg by mouth daily.      . Multiple Vitamins-Minerals (CENTRUM SILVER ULTRA MENS PO) Take by mouth. Take daily      . sodium bicarbonate 650 MG tablet Take 650 mg by mouth 4 (four) times daily. Take 2 tabs twice a day      . warfarin (COUMADIN) 5 MG tablet Take as  directed by coumadin clinic  60 tablet  1   No current facility-administered medications for this visit.    Allergies:   No Known Allergies  Social History:  The patient  reports that he quit smoking about 30 years ago. His smoking use included Cigarettes. He has a 66 pack-year smoking history. He has never used smokeless tobacco. He reports that he does not drink alcohol or use illicit drugs.   Family History:  The patient's family history includes Alcohol abuse in his father; Alzheimer's disease in his sister; Arrhythmia in his father; CVA in his mother; Heart attack in his father; Heart disease in his father; Multiple sclerosis in his brother.   ROS:  Please see the history of present illness.      All other systems reviewed and negative.   PHYSICAL EXAM: VS:  BP 157/87  Pulse 52  Wt 176 lb (79.833 kg) Well nourished, well developed, in no acute distress HEENT: normal Neck: no JVD Cardiac:   normal S1, S2; RRR; 2/6 systolic murmur at RUSB to LLSB Lungs:  clear to auscultation bilaterally, no wheezing, rhonchi or rales Abd: soft, nontender, no hepatomegaly Ext: no edema Skin: warm and dry Neuro:  CNs 2-12 intact, no focal abnormalities noted  EKG:  sinus bradycardia at 52bpm  IRBBB   ASSESSMENT AND PLAN:  1.  Atrial Fibrillation- maintaining sinus bradycardia off amio - continue warfarin  2.  HTN - borderline control - continue amlodipine  3.  HOCM 4.  Moderate AS - recheck echo in July 2015  5.  Chronic anticoagulation 6.  Dizziness - improved off Amio  Followup with me in 6 months  Signed, Fransico Him, MD 08/14/2013 10:39 AM

## 2013-08-14 NOTE — Patient Instructions (Signed)
Your physician recommends that you schedule a follow-up appointment in: as needed Your physician recommends that you continue on your current medications as directed. Please refer to the Current Medication list given to you today.   

## 2013-08-20 ENCOUNTER — Other Ambulatory Visit: Payer: Self-pay | Admitting: *Deleted

## 2013-08-20 MED ORDER — AMLODIPINE BESYLATE 10 MG PO TABS: 10.0000 mg | ORAL_TABLET | Freq: Every day | ORAL | Status: DC

## 2013-08-25 ENCOUNTER — Encounter: Payer: Self-pay | Admitting: Internal Medicine

## 2013-08-28 ENCOUNTER — Ambulatory Visit: Payer: MEDICARE | Admitting: Internal Medicine

## 2013-08-29 ENCOUNTER — Telehealth: Payer: Self-pay | Admitting: Internal Medicine

## 2013-08-29 NOTE — Telephone Encounter (Signed)
Pt returned call

## 2013-08-29 NOTE — Telephone Encounter (Signed)
[id number] x1 

## 2013-08-29 NOTE — Telephone Encounter (Signed)
Spoke with pt. He just needed to r/s his 09/11/13 appt to a later time. Nothing further needed

## 2013-09-03 ENCOUNTER — Encounter: Payer: Self-pay | Admitting: Internal Medicine

## 2013-09-11 ENCOUNTER — Encounter: Payer: Self-pay | Admitting: Internal Medicine

## 2013-09-11 ENCOUNTER — Ambulatory Visit (INDEPENDENT_AMBULATORY_CARE_PROVIDER_SITE_OTHER): Payer: MEDICARE | Admitting: Internal Medicine

## 2013-09-11 ENCOUNTER — Ambulatory Visit: Payer: MEDICARE | Admitting: Internal Medicine

## 2013-09-11 VITALS — BP 144/68 | HR 48 | Temp 97.6°F | Ht 68.0 in | Wt 177.6 lb

## 2013-09-11 DIAGNOSIS — R0609 Other forms of dyspnea: Secondary | ICD-10-CM

## 2013-09-11 DIAGNOSIS — R0989 Other specified symptoms and signs involving the circulatory and respiratory systems: Secondary | ICD-10-CM

## 2013-09-11 DIAGNOSIS — R0600 Dyspnea, unspecified: Secondary | ICD-10-CM

## 2013-09-11 NOTE — Progress Notes (Signed)
Subjective:    Patient ID: [name], male    DOB: 1940-08-27   MRN: [id number]    Brief patient profile:  50 yowm quit smoking 1985 with some cough resolved completely then onset of doe x 2011 referred Dr Loreta Ave 07/17/2013 to pulmonary clinic for sob.    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 Pulmonary Consult    Referred per Dr. Ollen Gross. Pt c/o SOB for the past 2 yrs. He gets SOB walking up steps and with minimal exertion such as making his bed.   wears out when walking flat like at [employer]/ across a big parking lot   But can treadmill #3 slow x flat x 76m, intermittent cough variably worse in winter and in am's min mucoid sputum. rec GERD  Diet        Lab Results  Component Value Date   ESRSEDRATE 61* 07/22/2013  -Amiodarone stopped 07/23/13     09/11/2013 f/u ov/Brandace Cargle re: sob ? Amiodarone related Chief Complaint  Patient presents with  . Follow-up    Pt states that his breathing has improved since last visit and cough "pretty much gone". No new co's today.    Not limited by breathing from desired activities    No obvious day to day or daytime variabilty or assoc chronic cough or cp or chest tightness, subjective wheeze overt sinus or hb symptoms. No unusual exp hx or h/o childhood pna/ asthma or knowledge of premature birth.  Sleeping ok without nocturnal  or early am exacerbation  of respiratory  c/o's or need for noct saba. Also denies any obvious fluctuation of symptoms with weather or environmental changes or other aggravating or alleviating factors except as outlined above   Current Medications, Allergies, Complete Past Medical History, Past Surgical History, Family History, and Social History were reviewed in [employer] record.  ROS  The following are not active complaints unless bolded sore throat,  dysphagia, dental problems, itching, sneezing,  nasal congestion or excess/ purulent secretions, ear ache,   fever, chills, sweats, unintended wt loss, pleuritic or exertional cp, hemoptysis,  orthopnea pnd or leg swelling, presyncope, palpitations, heartburn, abdominal pain, anorexia, nausea, vomiting, diarrhea  or change in bowel or urinary habits, change in stools or urine, dysuria,hematuria,  rash, arthralgias, visual complaints, headache, numbness weakness or ataxia or problems with walking or coordination,  change in mood/affect or memory.          Objective:   Physical Exam  09/11/2013        178  Wt Readings from Last 3 Encounters:  07/17/13 170 lb (77.111 kg)  06/30/13 178 lb 3.2 oz (80.831 kg)  05/26/13 185 lb 12.8 oz (84.278 kg)     HEENT: edentulous, nl  turbinates, and orophanx. Nl external ear canals without cough reflex   NECK :  without JVD/Nodes/TM/ nl carotid upstrokes bilaterally   LUNGS: no acc muscle use,   without cough on insp or exp maneuvers, clear to A and P   CV:  RRR  no s3 or murmur or increase in P2, no edema   ABD:  soft and nontender with nl excursion in the supine position. No bruits or organomegaly, bowel sounds nl  MS:  warm without deformities, calf tenderness, cyanosis or clubbing  SKIN: warm and dry without lesions    NEURO:  alert, approp, no deficits    CXR  07/17/2013 :  No active cardiopulmonary disease. Emphysema         Assessment & Plan:

## 2013-09-11 NOTE — Patient Instructions (Addendum)
Only need to return if you feel you are loosing ground with your ability to walk on your treadmill

## 2013-09-12 NOTE — Assessment & Plan Note (Addendum)
-  PFT s 07/14/13  Min airflow obst, DLCO  43% corrects to 47  - 07/17/2013  Walked RA x 3 laps @ 185 ft each stopped due to end of study, lowest sat 88 but then increased before stopped -Amiodarone stopped 07/23/13  - 09/11/2013  Walked RA x 3 laps @ 185 ft each stopped due to end of study sats 94% at end, no sob   His ex tol, cough and sats have all improved off amiodarone with baseline ESR of 61 on it (too early to repeat and probably not necessary)  Pulmonary f/u can be prn if symptoms worsen off amiodarone

## 2013-09-18 ENCOUNTER — Ambulatory Visit (INDEPENDENT_AMBULATORY_CARE_PROVIDER_SITE_OTHER): Payer: MEDICARE | Admitting: Pharmacist

## 2013-09-18 DIAGNOSIS — I4891 Unspecified atrial fibrillation: Secondary | ICD-10-CM

## 2013-09-18 LAB — POCT INR: INR: 2

## 2013-10-23 ENCOUNTER — Other Ambulatory Visit: Payer: Self-pay | Admitting: Dermatology

## 2013-10-30 ENCOUNTER — Ambulatory Visit (INDEPENDENT_AMBULATORY_CARE_PROVIDER_SITE_OTHER): Payer: MEDICARE | Admitting: Pharmacist Clinician (PhC)/ Clinical Pharmacy Specialist

## 2013-10-30 DIAGNOSIS — I4891 Unspecified atrial fibrillation: Secondary | ICD-10-CM

## 2013-10-30 LAB — POCT INR: INR: 1.6

## 2013-11-17 ENCOUNTER — Other Ambulatory Visit (HOSPITAL_COMMUNITY): Payer: PRIVATE HEALTH INSURANCE

## 2013-11-17 ENCOUNTER — Ambulatory Visit (HOSPITAL_COMMUNITY): Payer: MEDICARE | Attending: Interventional Cardiology

## 2013-11-17 ENCOUNTER — Other Ambulatory Visit: Payer: Self-pay | Admitting: General Surgery

## 2013-11-17 DIAGNOSIS — I4891 Unspecified atrial fibrillation: Secondary | ICD-10-CM | POA: Diagnosis not present

## 2013-11-17 DIAGNOSIS — I350 Nonrheumatic aortic (valve) stenosis: Secondary | ICD-10-CM

## 2013-11-17 DIAGNOSIS — R0989 Other specified symptoms and signs involving the circulatory and respiratory systems: Secondary | ICD-10-CM | POA: Insufficient documentation

## 2013-11-17 DIAGNOSIS — I951 Orthostatic hypotension: Secondary | ICD-10-CM | POA: Diagnosis not present

## 2013-11-17 DIAGNOSIS — Z87891 Personal history of nicotine dependence: Secondary | ICD-10-CM | POA: Diagnosis not present

## 2013-11-17 DIAGNOSIS — I10 Essential (primary) hypertension: Secondary | ICD-10-CM | POA: Insufficient documentation

## 2013-11-17 DIAGNOSIS — I359 Nonrheumatic aortic valve disorder, unspecified: Secondary | ICD-10-CM | POA: Diagnosis present

## 2013-11-17 DIAGNOSIS — I498 Other specified cardiac arrhythmias: Secondary | ICD-10-CM | POA: Insufficient documentation

## 2013-11-17 DIAGNOSIS — I059 Rheumatic mitral valve disease, unspecified: Secondary | ICD-10-CM | POA: Diagnosis not present

## 2013-11-17 DIAGNOSIS — Z8551 Personal history of malignant neoplasm of bladder: Secondary | ICD-10-CM | POA: Insufficient documentation

## 2013-11-17 DIAGNOSIS — R0609 Other forms of dyspnea: Secondary | ICD-10-CM | POA: Insufficient documentation

## 2013-11-17 NOTE — Progress Notes (Signed)
2D Echo completed. 11/17/2013  

## 2013-11-19 ENCOUNTER — Other Ambulatory Visit: Payer: Self-pay | Admitting: General Surgery

## 2013-11-20 ENCOUNTER — Other Ambulatory Visit: Payer: Self-pay | Admitting: General Surgery

## 2013-11-20 ENCOUNTER — Ambulatory Visit (INDEPENDENT_AMBULATORY_CARE_PROVIDER_SITE_OTHER): Payer: MEDICARE | Admitting: *Deleted

## 2013-11-20 DIAGNOSIS — I4891 Unspecified atrial fibrillation: Secondary | ICD-10-CM

## 2013-11-20 DIAGNOSIS — I350 Nonrheumatic aortic (valve) stenosis: Secondary | ICD-10-CM

## 2013-11-20 LAB — POCT INR: INR: 1.8

## 2013-12-04 ENCOUNTER — Other Ambulatory Visit: Payer: Self-pay | Admitting: Cardiology

## 2013-12-04 ENCOUNTER — Ambulatory Visit (INDEPENDENT_AMBULATORY_CARE_PROVIDER_SITE_OTHER): Payer: MEDICARE

## 2013-12-04 DIAGNOSIS — I4891 Unspecified atrial fibrillation: Secondary | ICD-10-CM

## 2013-12-04 LAB — POCT INR: INR: 2.3

## 2013-12-04 MED ORDER — WARFARIN SODIUM 5 MG PO TABS: ORAL_TABLET | ORAL | Status: DC

## 2014-01-01 ENCOUNTER — Ambulatory Visit (INDEPENDENT_AMBULATORY_CARE_PROVIDER_SITE_OTHER): Payer: MEDICARE

## 2014-01-01 DIAGNOSIS — I4891 Unspecified atrial fibrillation: Secondary | ICD-10-CM

## 2014-01-01 LAB — POCT INR: INR: 2.4

## 2014-02-04 ENCOUNTER — Other Ambulatory Visit: Payer: Self-pay | Admitting: Cardiology

## 2014-02-05 ENCOUNTER — Ambulatory Visit (INDEPENDENT_AMBULATORY_CARE_PROVIDER_SITE_OTHER): Payer: MEDICARE

## 2014-02-05 DIAGNOSIS — I4891 Unspecified atrial fibrillation: Secondary | ICD-10-CM

## 2014-02-05 LAB — POCT INR: INR: 2.5

## 2014-02-16 ENCOUNTER — Ambulatory Visit (INDEPENDENT_AMBULATORY_CARE_PROVIDER_SITE_OTHER): Payer: MEDICARE | Admitting: Cardiology

## 2014-02-16 ENCOUNTER — Encounter: Payer: Self-pay | Admitting: Cardiology

## 2014-02-16 VITALS — BP 130/84 | HR 63 | Ht 68.0 in | Wt 181.0 lb

## 2014-02-16 DIAGNOSIS — I951 Orthostatic hypotension: Secondary | ICD-10-CM

## 2014-02-16 DIAGNOSIS — I350 Nonrheumatic aortic (valve) stenosis: Secondary | ICD-10-CM

## 2014-02-16 DIAGNOSIS — I421 Obstructive hypertrophic cardiomyopathy: Secondary | ICD-10-CM

## 2014-02-16 DIAGNOSIS — I10 Essential (primary) hypertension: Secondary | ICD-10-CM

## 2014-02-16 DIAGNOSIS — I480 Paroxysmal atrial fibrillation: Secondary | ICD-10-CM

## 2014-02-16 NOTE — Patient Instructions (Addendum)
Your physician has recommended that you wear a holter monitor. Holter monitors are medical devices that record the heart's electrical activity. Doctors most often use these monitors to diagnose arrhythmias. Arrhythmias are problems with the speed or rhythm of the heartbeat. The monitor is a small, portable device. You can wear one while you do your normal daily activities. This is usually used to diagnose what is causing palpitations/syncope (passing out).  Your physician recommends that you continue on your current medications as directed. Please refer to the Current Medication list given to you today.  Your physician wants you to follow-up in: 6 months with Dr. Radford Pax. You will receive a reminder letter in the mail two months in advance. If you don't receive a letter, please call our office to schedule the follow-up appointment.

## 2014-02-16 NOTE — Progress Notes (Signed)
[place], [place], [place]  [address] Phone: [phone] Fax:  [phone]  Date:  02/16/2014   ID:  Jakell, Trusty [birth year], MRN [id number]  PCP:  Pcp Not In System  Cardiologist:  Fransico Him, MD    History of Present Illness: [name] is a 73 y.o. male with a history of atrial fibrillation, HTN, HOCM, orthostatic hypotension and moderate AS. He presents today for followup.  He is doing well.  His SOB has resolved after stopping amiodarone.  He denies any chest pain, dizziness or syncope.  Occasionally his heart will skip and he feels like he is going into afib but it never happens.  He occasionally has some mild LLE edema.  He is frustrated with his kidney function and is now going through information sessions for preparation for HD.     Wt Readings from Last 3 Encounters:  02/16/14 181 lb (82.101 kg)  09/11/13 177 lb 9.6 oz (80.559 kg)  08/14/13 176 lb (79.833 kg)     Past Medical History  Diagnosis Date  . Paroxysmal atrial fibrillation     cardioversion 11/21/10 - Dr Radford Pax  . Hypertension   . Warfarin anticoagulation   . Renal insufficiency     followed by Dr Jimmy Footman  . Hypertrophic obstructive cardiomyopathy   . Bladder cancer   . Aortic stenosis, moderate     echo 10/2012  . Orthostatic hypotension   . Diastolic dysfunction     Current Outpatient Prescriptions  Medication Sig Dispense Refill  . amLODipine (NORVASC) 10 MG tablet TAKE 1 TABLET BY MOUTH EVERY DAY.  90 tablet  3  . B Complex-C (SUPER B COMPLEX PO) Take 1 capsule by mouth daily.       . calcitRIOL (ROCALTROL) 0.25 MCG capsule Take 0.25 mcg by mouth daily.      . cholecalciferol (VITAMIN D) 1000 UNITS tablet Take 1,000 Units by mouth daily.      . Cinnamon 500 MG capsule Take 1,000 mg by mouth daily.       . Glucosamine-Chondroit-Vit C-Mn (GLUCOSAMINE-CHONDROITIN) TABS Take 2 tablets by mouth daily.       . Multiple Vitamins-Minerals (CENTRUM SILVER ULTRA MENS PO) Take  by mouth. Take daily      . sodium bicarbonate 650 MG tablet Take 650 mg by mouth 4 (four) times daily. Take 2 tabs twice a day      . warfarin (COUMADIN) 5 MG tablet TAKE TABLETS BY MOUTH  AS DIRECTED BY COUMADIN CLINIC.  90 tablet  1  . warfarin (COUMADIN) 5 MG tablet Take as directed by coumadin clinic  90 tablet  1   No current facility-administered medications for this visit.    Allergies:   No Known Allergies  Social History:  The patient  reports that he quit smoking about 30 years ago. His smoking use included Cigarettes. He has a 66 pack-year smoking history. He has never used smokeless tobacco. He reports that he does not drink alcohol or use illicit drugs.   Family History:  The patient's family history includes Alcohol abuse in his father; Alzheimer's disease in his sister; Arrhythmia in his father; CVA in his mother; Heart attack in his father; Heart disease in his father; Multiple sclerosis in his brother.   ROS:  Please see the history of present illness.      All other systems reviewed and negative.   PHYSICAL EXAM: VS:  BP 130/84  Pulse 63  Ht 5\' 8"  (  1.727 m)  Wt 181 lb (82.101 kg)  BMI 27.53 kg/m2 Well nourished, well developed, in no acute distress HEENT: normal Neck: no JVD Cardiac:  normal S1, S2; irregularly irregular; no murmur Lungs:  clear to auscultation bilaterally, no wheezing, rhonchi or rales Abd: soft, nontender, no hepatomegaly Ext: no edema Skin: warm and dry Neuro:  CNs 2-12 intact, no focal abnormalities noted  EKG:  Atrial fibrillation at 102bpm with no ST changes  ASSESSMENT AND PLAN:  1. Atrial Fibrillation- now back in afib off amio.  He is completely asymptomatic.  We have discussed rhythm vs. Rate control.  Since he is tolerating the afib with no symptoms he would like to opt for rate control.   His HR is around 100bpm.  I will get a Holter monitor to assess average HR and decide if he needs to have any changes in medical therapy.   -  continue warfarin  2. HTN - well controlled  - continue amlodipine  3. HOCM  4. Moderate AS  - recheck echo in 1 year 5. Chronic anticoagulation  6. Dizziness - resolved off Amio   Followup with me in 6 months   Signed, Fransico Him, MD [facility] 02/16/2014 2:44 PM

## 2014-02-18 ENCOUNTER — Encounter: Payer: Self-pay | Admitting: *Deleted

## 2014-02-18 ENCOUNTER — Encounter (INDEPENDENT_AMBULATORY_CARE_PROVIDER_SITE_OTHER): Payer: MEDICARE

## 2014-02-18 DIAGNOSIS — I480 Paroxysmal atrial fibrillation: Secondary | ICD-10-CM

## 2014-02-18 NOTE — Progress Notes (Signed)
Patient ID: [name], male   DOB: March 03, 1941, 73 y.o.   MRN: [id number] [facility] 24 hour holter monitor applied to patient.

## 2014-02-23 ENCOUNTER — Telehealth: Payer: Self-pay | Admitting: *Deleted

## 2014-02-23 NOTE — Telephone Encounter (Signed)
Notified the pt that Dr Meda Coffee reviewed his 24 hour holter monitor (DOD), and per Dr Meda Coffee at [phone] the pt had noted afib with intermittent Rapid ventricular and noted multiform ventricular events, with one Long Run of V-tach.  Per the pt he is asymptomatic and felt nothing over the night.  Informed the pt that per Dr Meda Coffee he needs to be referred to EP and establish as a new pt to see Dr Lovena Le.  Informed the pt that I will forward this message to the pts Primary Cardiologist Dr Radford Pax and primary nurse for further review.  Informed the pt that I will send our schedulers a message to set up the EP referral.  Pt verbalized understanding and agrees with this plan.

## 2014-02-24 NOTE — Telephone Encounter (Signed)
Appointment made for 02-26-14 @ 8:45 with Dr. Lovena Le.  Pt is aware.

## 2014-02-26 ENCOUNTER — Encounter: Payer: Self-pay | Admitting: Internal Medicine

## 2014-02-26 ENCOUNTER — Ambulatory Visit (INDEPENDENT_AMBULATORY_CARE_PROVIDER_SITE_OTHER): Payer: MEDICARE | Admitting: Internal Medicine

## 2014-02-26 VITALS — BP 136/82 | HR 85 | Ht 68.0 in

## 2014-02-26 DIAGNOSIS — I482 Chronic atrial fibrillation, unspecified: Secondary | ICD-10-CM

## 2014-02-26 DIAGNOSIS — I472 Ventricular tachycardia, unspecified: Secondary | ICD-10-CM | POA: Insufficient documentation

## 2014-02-26 DIAGNOSIS — R001 Bradycardia, unspecified: Secondary | ICD-10-CM

## 2014-02-26 DIAGNOSIS — I4729 Other ventricular tachycardia: Secondary | ICD-10-CM | POA: Insufficient documentation

## 2014-02-26 NOTE — Patient Instructions (Signed)
Your physician wants you to follow-up in: 6 months with Dr Taylor You will receive a reminder letter in the mail two months in advance. If you don't receive a letter, please call our office to schedule the follow-up appointment.  

## 2014-02-26 NOTE — Assessment & Plan Note (Signed)
He is maintaining sinus rhythm on amiodarone. He is now back in atrial fibrillation. 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 He will continue a strategy of rate control.

## 2014-02-26 NOTE — Assessment & Plan Note (Signed)
His symptoms are minimal, and he has nonsustained episodes. With his renal dysfunction, the only medication that we could consider at this time his amiodarone. Previously he had been on amiodarone for atrial fibrillation and felt terribly. I would not recommend reinitiation of amiodarone. He has not had syncope from his tachycardia. 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

## 2014-02-26 NOTE — Progress Notes (Signed)
HPI [name] returns today for followup. 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 He has not had syncope. He is without palpitation. He is still playing golf on a regular basis with minimal difficulty. He notes that when he has to exert himself in v[place]ery hot weather, he gets short of breath and feels bad.  No Known Allergies   Current Outpatient Prescriptions  Medication Sig Dispense Refill  . amLODipine (NORVASC) 10 MG tablet TAKE 1 TABLET BY MOUTH EVERY DAY.  90 tablet  3  . B Complex-C (SUPER B COMPLEX PO) Take 1 capsule by mouth daily.       . calcitRIOL (ROCALTROL) 0.25 MCG capsule Take 0.25 mcg by mouth daily.      . cholecalciferol (VITAMIN D) 1000 UNITS tablet Take 1,000 Units by mouth daily.      . Cinnamon 500 MG capsule Take 1,000 mg by mouth daily.       . Glucosamine-Chondroit-Vit C-Mn (GLUCOSAMINE-CHONDROITIN) TABS Take 2 tablets by mouth daily.       . Multiple Vitamins-Minerals (CENTRUM SILVER ULTRA MENS PO) Take by mouth. Take daily      . sodium bicarbonate 650 MG tablet Take 650 mg by mouth 4 (four) times daily. Take 2 tabs twice a day      . warfarin (COUMADIN) 5 MG tablet Take as directed by coumadin clinic  90 tablet  1   No current facility-administered medications for this visit.     Past Medical History  Diagnosis Date  . Paroxysmal atrial fibrillation     cardioversion 11/21/10 - Dr Radford Pax  . Hypertension   . Warfarin anticoagulation   . Renal insufficiency     followed by Dr Jimmy Footman  . Hypertrophic obstructive cardiomyopathy   . Bladder cancer   . Aortic stenosis, moderate     echo 10/2012  . Orthostatic hypotension   . Diastolic dysfunction     ROS:   All systems reviewed and negative except as noted in the HPI.   Past Surgical  History  Procedure Laterality Date  . Cardioversion  11/21/2010  . Cystectomy    . Cholecystectomy       Family History  Problem Relation Age of Onset  . CVA Mother   . Heart attack Father   . Heart disease Father   . Arrhythmia Father   . Alzheimer's disease Sister   . Multiple sclerosis Brother   . Alcohol abuse Father      History   Social History  . Marital Status: Married    Spouse Name: N/A    Number of Children: N/A  . Years of Education: N/A   Occupational History  . Not on file.   Social History Main Topics  . Smoking status: Former Smoker -- 2.00 packs/day for 33 years    Types: Cigarettes    Quit date: 05/02/1983  . Smokeless tobacco: Never Used  . Alcohol Use: No  . Drug Use: No  . Sexual Activity: Not on file   Other Topics Concern  . Not on file   Social History Narrative  . No narrative on file     BP 136/82  Pulse 85  Ht 5\' 8"  (1.727 m)  Physical Exam:  Well appearing 73 year old man, NAD HEENT: Unremarkable Neck:  7 cm JVD, no thyromegally Back:  No CVA tenderness  Lungs:  Clear with no wheezes, rales, or rhonchi. HEART:  IRegular rate rhythm, 2/6 systolic murmurs, no rubs, no clicks Abd:  soft, positive bowel sounds, no organomegally, no rebound, no guarding Ext:  2 plus pulses, no edema, no cyanosis, no clubbing Skin:  No rashes no nodules Neuro:  CN II through XII intact, motor grossly intact  EKG - atrial fibrillation with a controlled ventricular response at 85 beats per minute  Cardiac monitor - demonstrates atrial fibrillation with runs of nonsustained ventricular tachycardia Assess/Plan:

## 2014-03-03 ENCOUNTER — Other Ambulatory Visit: Payer: Self-pay | Admitting: *Deleted

## 2014-03-03 ENCOUNTER — Encounter: Payer: Self-pay | Admitting: Surgery

## 2014-03-03 DIAGNOSIS — Z01810 Encounter for preprocedural cardiovascular examination: Secondary | ICD-10-CM

## 2014-03-03 DIAGNOSIS — N184 Chronic kidney disease, stage 4 (severe): Secondary | ICD-10-CM

## 2014-03-18 ENCOUNTER — Ambulatory Visit (INDEPENDENT_AMBULATORY_CARE_PROVIDER_SITE_OTHER): Payer: MEDICARE | Admitting: Pharmacist

## 2014-03-18 DIAGNOSIS — I4891 Unspecified atrial fibrillation: Secondary | ICD-10-CM

## 2014-03-18 LAB — POCT INR: INR: 4

## 2014-03-25 ENCOUNTER — Encounter: Payer: Self-pay | Admitting: Surgery

## 2014-03-30 ENCOUNTER — Encounter: Payer: Self-pay | Admitting: Surgery

## 2014-03-30 ENCOUNTER — Ambulatory Visit (INDEPENDENT_AMBULATORY_CARE_PROVIDER_SITE_OTHER)
Admission: RE | Admit: 2014-03-30 | Discharge: 2014-03-30 | Disposition: A | Discharge status: Home / Self Care | Payer: MEDICARE | Source: Ambulatory Visit | Attending: Surgery | Admitting: Surgery

## 2014-03-30 ENCOUNTER — Ambulatory Visit (HOSPITAL_COMMUNITY)
Admission: RE | Admit: 2014-03-30 | Discharge: 2014-03-30 | Disposition: A | Discharge status: Home / Self Care | Payer: MEDICARE | Source: Ambulatory Visit | Attending: Family | Admitting: Family

## 2014-03-30 ENCOUNTER — Ambulatory Visit (INDEPENDENT_AMBULATORY_CARE_PROVIDER_SITE_OTHER): Payer: MEDICARE | Admitting: Surgery

## 2014-03-30 VITALS — BP 151/96 | HR 54 | Temp 97.3°F | Resp 16 | Ht 68.0 in | Wt 173.0 lb

## 2014-03-30 DIAGNOSIS — Z01810 Encounter for preprocedural cardiovascular examination: Secondary | ICD-10-CM

## 2014-03-30 DIAGNOSIS — N185 Chronic kidney disease, stage 5: Secondary | ICD-10-CM | POA: Diagnosis not present

## 2014-03-30 DIAGNOSIS — N184 Chronic kidney disease, stage 4 (severe): Secondary | ICD-10-CM | POA: Insufficient documentation

## 2014-03-30 NOTE — Progress Notes (Signed)
   Patient name: [name] MRN: [id number] DOB: 03/05/1941 Sex: male   Referred by: Dr. Detterding  Reason for referral:  Chief Complaint  Patient presents with  . New Evaluation    REF- Dr.Deterding  Not on HD yet - Kidney are stable now     HISTORY OF PRESENT ILLNESS:  This is a 73-year-old gentleman that comes in today for discussions of dialysis access.  His renal failure secondary to hypertension.  It is complicated by secondary hyperparathyroidism and anemia of chronic disease.  The patient states that he is ambidextrous but does most activities with his right hand.  The patient suffers from a total fibrillation and is on chronic anticoagulation.  He has a history of bladder cancer and is status post cystectomy with ileal conduit.  He also has aortic stenosis.  Past Medical History  Diagnosis Date  . Paroxysmal atrial fibrillation     cardioversion 11/21/10 - Dr Turner  . Hypertension   . Warfarin anticoagulation   . Renal insufficiency     followed by Dr Deterding  . Hypertrophic obstructive cardiomyopathy   . Bladder cancer   . Aortic stenosis, moderate     echo 10/2012  . Orthostatic hypotension   . Diastolic dysfunction     Past Surgical History  Procedure Laterality Date  . Cardioversion  11/21/2010  . Cystectomy    . Cholecystectomy      History   Social History  . Marital Status: Married    Spouse Name: N/A    Number of Children: N/A  . Years of Education: N/A   Occupational History  . Not on file.   Social History Main Topics  . Smoking status: Former Smoker -- 2.00 packs/day for 33 years    Types: Cigarettes    Quit date: 05/02/1983  . Smokeless tobacco: Never Used  . Alcohol Use: No  . Drug Use: No  . Sexual Activity: Not on file   Other Topics Concern  . Not on file   Social History Narrative    Family History  Problem Relation Age of Onset  . CVA Mother   . Heart attack Father   . Heart disease Father   . Arrhythmia Father    . Alzheimer's disease Sister   . Multiple sclerosis Brother   . Alcohol abuse Father     Allergies as of 03/30/2014  . (No Known Allergies)    Current Outpatient Prescriptions on File Prior to Visit  Medication Sig Dispense Refill  . amLODipine (NORVASC) 10 MG tablet TAKE 1 TABLET BY MOUTH EVERY DAY. 90 tablet 3  . B Complex-C (SUPER B COMPLEX PO) Take 1 capsule by mouth daily.     . calcitRIOL (ROCALTROL) 0.25 MCG capsule Take 0.25 mcg by mouth daily.    . cholecalciferol (VITAMIN D) 1000 UNITS tablet Take 1,000 Units by mouth daily.    . Cinnamon 500 MG capsule Take 2,000 mg by mouth daily.     . Glucosamine-Chondroit-Vit C-Mn (GLUCOSAMINE-CHONDROITIN) TABS Take 2 tablets by mouth daily.     . Multiple Vitamins-Minerals (CENTRUM SILVER ULTRA MENS PO) Take by mouth. Take daily    . sodium bicarbonate 650 MG tablet Take 650 mg by mouth 4 (four) times daily. Take 2 tabs twice a day    . warfarin (COUMADIN) 5 MG tablet Take as directed by coumadin clinic 90 tablet 1   No current facility-administered medications on file prior to visit.     REVIEW OF SYSTEMS: Cardiovascular:   No chest pain, chest pressure, palpitations, orthopnea, or dyspnea on exertion. No claudication or rest pain,  No history of DVT or phlebitis.  Positive leg swelling Pulmonary: No productive cough, asthma or wheezing. Neurologic: No weakness, paresthesias, aphasia, or amaurosis. No dizziness. Hematologic: No bleeding problems or clotting disorders. Musculoskeletal: No joint pain or joint swelling. Gastrointestinal: No blood in stool or hematemesis Genitourinary: No dysuria or hematuria. Psychiatric:: No history of major depression. Integumentary: No rashes or ulcers. Constitutional: No fever or chills.  PHYSICAL EXAMINATION: General: The patient appears their stated age.  Vital signs are BP 151/96 mmHg  Pulse 54  Temp(Src) 97.3 F (36.3 C) (Oral)  Resp 16  Ht 5' 8" (1.727 m)  Wt 173 lb (78.472 kg)   BMI 26.31 kg/m2  SpO2 100% HEENT:  No gross abnormalities Pulmonary: Respirations are non-labored Abdomen: Soft and non-tender  Musculoskeletal: There are no major deformities.   Neurologic: No focal weakness or paresthesias are detected, Skin: There are no ulcer or rashes noted. Psychiatric: The patient has normal affect. Cardiovascular: There is a regular rate and rhythm without significant murmur appreciated.  Palpable left brachial and radial pulse  Diagnostic Studies: Vein mapping was reviewed.  This shows an adequate bilateral cephalic veins from the mid forearm proximally.  Abnormal Allen test on the left   Assessment:  End-stage renal disease, stage IV Plan: The patient requests a fistula in his left arm.  He does have an abnormal Allen's test, however I think his risk for steal syndrome is minimal.  I did discuss this risk with him.  I also discussed the risk of non-maturity and the need for future interventions.  He is a candidate for a left brachiocephalic fistula.  I will evaluate his cephalic vein in the forearm to make sure that a radiocephalic fistula cannot be placed.  I have scheduled his operation for Friday, December 18.  He will need to be off of his Coumadin for 5 days prior to his operation.  I will discuss with his cardiologist, Dr. Turner whether or not he needs a Lovenox bridge.     V. Wells Brabham IV, M.D. [facility] Office: [phone] Pager:  [phone]    

## 2014-03-31 ENCOUNTER — Other Ambulatory Visit: Payer: Self-pay | Admitting: *Deleted

## 2014-03-31 ENCOUNTER — Telehealth: Payer: Self-pay | Admitting: Cardiology

## 2014-03-31 NOTE — Telephone Encounter (Signed)
Patient can hold warfarin without Lovenox bridge

## 2014-03-31 NOTE — Telephone Encounter (Signed)
Patient is having LUE AVF placed 12/18 and he needs to be off coumadin 5 days prior.  He has appointment in [facility] 12/7 and he knows to ask about a possible Lovenox bridge.  To Dr. Radford Pax and Gay Filler for review.

## 2014-03-31 NOTE — Telephone Encounter (Signed)
New message     Pt is having a lft arm avf for dialysis.  He needs to be off coumadin 5 days prior.  He is coming here 12-7.  Will he need lovenox bridge?

## 2014-04-02 NOTE — Telephone Encounter (Signed)
Placed in "to be fixed" bin in Medical Records.

## 2014-04-02 NOTE — Telephone Encounter (Signed)
Left message for Zigmund Daniel to call back with fax number.

## 2014-04-06 ENCOUNTER — Ambulatory Visit (INDEPENDENT_AMBULATORY_CARE_PROVIDER_SITE_OTHER): Payer: MEDICARE

## 2014-04-06 DIAGNOSIS — I4891 Unspecified atrial fibrillation: Secondary | ICD-10-CM

## 2014-04-06 LAB — POCT INR: INR: 3.9

## 2014-04-10 ENCOUNTER — Other Ambulatory Visit: Payer: Self-pay

## 2014-04-16 ENCOUNTER — Other Ambulatory Visit: Payer: Self-pay | Admitting: Dermatology

## 2014-04-16 ENCOUNTER — Encounter (HOSPITAL_COMMUNITY): Payer: Self-pay | Admitting: *Deleted

## 2014-04-16 MED ORDER — DEXTROSE 5 % IV SOLN
1.5000 g | INTRAVENOUS | Status: AC
  Administered 2014-04-17: 1.5 g via INTRAVENOUS
  Filled 2014-04-16: qty 1.5

## 2014-04-16 NOTE — Progress Notes (Signed)
Pt denies SOB and chest pain. Pt is under the care of Dr.Traci  Turner, cardiology. Pt stated that his last dose of Coumadin was sat 04/11/14. Pt made aware to stop taking vitamins,  herbal medications and  NSAIDs ie: Ibuprofen, Advil, Naproxen and etc. Pt chart forwarded to [place], [place] ( anesthesia) for review of cardiac history.

## 2014-04-16 NOTE — Progress Notes (Signed)
Anesthesia Chart Review:  Patient is a 73 year old male scheduled for left arm AVF creation tomorrow by Dr. Trula Slade. He is scheduled as a same day work-up.  History includes former smoker, PAF/afib with cardioversion '12 with recurrence (Amiodarone d/c'd 07/2013 due to severely reduced DLCO), moderate AS (mild to moderate AS 06/8451), diastolic dysfunction, hypertrophic obstructive cardiomyopathy (mild LVH, EF 55-60% 10/2013 echo), orthostatic hypotension, bladder cancer s/p cystectomy (self catheterizes), CKD IV not yet on HD, secondary hyperparathyroidism, anemia of chronic disease. Nephrologist is Dr. Jimmy Footman. Pulmonologist is Dr. Melvyn Novas (PRN f/u 09/12/13 unless symptoms worsen off amiodarone). Cardiologist is Dr. Fransico Him who felt patient could hold warfarin for surgery without need for Lovenox bridge. He has also been evaluated by EP cardiologist Dr. Lovena Le for afib and nonsustained VT on cardiac monitor who felt there was limited medical therapy for arrhythmia treatment since he has near ESRD and is intolerant to amiodarone.  As of 02/26/14, he was felt to be a poor candidate for ICD implantation.    02/18/14 24 hour Holter: afib with intermittent RVR and slow ventricular response. Multiform ventricular events, one long run of CT. No entries reported. (As of 02/26/14, felt to be a poor candidate for ICD by Dr. Lovena Le.)  11/17/13 echo: - Left ventricle: The cavity size was normal. There was mild concentric hypertrophy. Systolic function was normal. The estimated ejection fraction was in the range of 55% to 60%. Wallmotion was normal; there were no regional wall motionabnormalities. Features are consistent with a pseudonormal left ventricular filling pattern, with concomitant abnormal relaxation and increased filling pressure (grade 2 diastolic dysfunction). - Aortic valve: There was mild to moderate stenosis. There was trivial regurgitation. There was no significant perivalvular regurgitation. Valve  area (VTI): 1.45 cm^2. Valve area (Vmax): 1.22 cm^2. - Mitral valve: Severely calcified annulus. The findings are consistent with trivial stenosis. There was mild regurgitation. Valve area by pressure half-time: 2.16 cm^2. Valve area by continuity equation (using LVOT flow): 1.41 cm^2. - Left atrium: The atrium was severely dilated. - Right atrium: The atrium was mildly dilated.  He underwent ETT to assess chronotropic response to exercise.  ETT showed poor exercise tolerance with chronotropic incompetance. EKG showed junctional bradycardia and HR only increased to 90's with exercise with drop in BP with exercise. He was referred to Dr. Lovena Le for EP evaluation.  As of 08/14/13, no indication for PPM.  02/26/14 EKG: afib, rightward axis, incomplete right BBB.  07/17/13 CXR: No active cardiopulmonary disease. Emphysema.  07/14/13 PFT: FVC 4.59 (120%), FEV1 3.12 (113%), FEF25-75% 1.95 (95%), TLC 6.03 (93%), DLCOunc 12.41 (43%).   He is due for labs on arrival.  Coumadin held starting 04/11/14.  Dr. Radford Pax is aware of surgery plans. If no acute changes then I anticipate that he can proceed as planned.    George Hugh [facility] Short Stay Center/Anesthesiology Phone [phone] 04/16/2014 2:27 PM

## 2014-04-17 ENCOUNTER — Encounter (HOSPITAL_COMMUNITY): Payer: Self-pay | Admitting: *Deleted

## 2014-04-17 ENCOUNTER — Other Ambulatory Visit: Payer: Self-pay | Admitting: *Deleted

## 2014-04-17 ENCOUNTER — Ambulatory Visit (HOSPITAL_COMMUNITY): Payer: MEDICARE | Admitting: Vascular Surgery

## 2014-04-17 ENCOUNTER — Encounter (HOSPITAL_COMMUNITY)
Admission: RE | Disposition: A | Discharge status: Home / Self Care | Payer: Self-pay | Source: Ambulatory Visit | Attending: Vascular Surgery

## 2014-04-17 ENCOUNTER — Telehealth: Payer: Self-pay | Admitting: Vascular Surgery

## 2014-04-17 ENCOUNTER — Ambulatory Visit (HOSPITAL_COMMUNITY)
Admission: RE | Admit: 2014-04-17 | Discharge: 2014-04-17 | Disposition: A | Discharge status: Home / Self Care | Payer: MEDICARE | Source: Ambulatory Visit | Attending: Vascular Surgery | Admitting: Vascular Surgery

## 2014-04-17 DIAGNOSIS — I421 Obstructive hypertrophic cardiomyopathy: Secondary | ICD-10-CM | POA: Insufficient documentation

## 2014-04-17 DIAGNOSIS — Z811 Family history of alcohol abuse and dependence: Secondary | ICD-10-CM | POA: Diagnosis not present

## 2014-04-17 DIAGNOSIS — I350 Nonrheumatic aortic (valve) stenosis: Secondary | ICD-10-CM | POA: Insufficient documentation

## 2014-04-17 DIAGNOSIS — I951 Orthostatic hypotension: Secondary | ICD-10-CM | POA: Diagnosis not present

## 2014-04-17 DIAGNOSIS — Z823 Family history of stroke: Secondary | ICD-10-CM | POA: Diagnosis not present

## 2014-04-17 DIAGNOSIS — Z8249 Family history of ischemic heart disease and other diseases of the circulatory system: Secondary | ICD-10-CM | POA: Diagnosis not present

## 2014-04-17 DIAGNOSIS — N289 Disorder of kidney and ureter, unspecified: Secondary | ICD-10-CM | POA: Diagnosis not present

## 2014-04-17 DIAGNOSIS — N2581 Secondary hyperparathyroidism of renal origin: Secondary | ICD-10-CM | POA: Insufficient documentation

## 2014-04-17 DIAGNOSIS — N186 End stage renal disease: Secondary | ICD-10-CM

## 2014-04-17 DIAGNOSIS — I480 Paroxysmal atrial fibrillation: Secondary | ICD-10-CM | POA: Diagnosis not present

## 2014-04-17 DIAGNOSIS — Z7901 Long term (current) use of anticoagulants: Secondary | ICD-10-CM | POA: Diagnosis not present

## 2014-04-17 DIAGNOSIS — I120 Hypertensive chronic kidney disease with stage 5 chronic kidney disease or end stage renal disease: Secondary | ICD-10-CM | POA: Insufficient documentation

## 2014-04-17 DIAGNOSIS — I4510 Unspecified right bundle-branch block: Secondary | ICD-10-CM | POA: Diagnosis not present

## 2014-04-17 DIAGNOSIS — Z9049 Acquired absence of other specified parts of digestive tract: Secondary | ICD-10-CM | POA: Insufficient documentation

## 2014-04-17 DIAGNOSIS — Z820 Family history of epilepsy and other diseases of the nervous system: Secondary | ICD-10-CM | POA: Insufficient documentation

## 2014-04-17 DIAGNOSIS — Z936 Other artificial openings of urinary tract status: Secondary | ICD-10-CM | POA: Insufficient documentation

## 2014-04-17 DIAGNOSIS — Z8551 Personal history of malignant neoplasm of bladder: Secondary | ICD-10-CM | POA: Diagnosis not present

## 2014-04-17 DIAGNOSIS — Z87891 Personal history of nicotine dependence: Secondary | ICD-10-CM | POA: Diagnosis not present

## 2014-04-17 DIAGNOSIS — N184 Chronic kidney disease, stage 4 (severe): Secondary | ICD-10-CM | POA: Diagnosis not present

## 2014-04-17 DIAGNOSIS — Z4931 Encounter for adequacy testing for hemodialysis: Secondary | ICD-10-CM

## 2014-04-17 HISTORY — PX: AV FISTULA PLACEMENT: SHX1204

## 2014-04-17 HISTORY — DX: Unspecified osteoarthritis, unspecified site: M19.90

## 2014-04-17 HISTORY — DX: Cardiac murmur, unspecified: R01.1

## 2014-04-17 HISTORY — DX: Other specified health status: Z78.9

## 2014-04-17 LAB — POCT I-STAT 4, (NA,K, GLUC, HGB,HCT)
Glucose, Bld: 101 mg/dL — ABNORMAL HIGH (ref 70–99)
HEMATOCRIT: 40 % (ref 39.0–52.0)
Hemoglobin: 13.6 g/dL (ref 13.0–17.0)
Potassium: 4.5 mEq/L (ref 3.7–5.3)
Sodium: 141 mEq/L (ref 137–147)

## 2014-04-17 LAB — PROTIME-INR
INR: 1.13 (ref 0.00–1.49)
Prothrombin Time: 14.7 seconds (ref 11.6–15.2)

## 2014-04-17 LAB — APTT: aPTT: 28 seconds (ref 24–37)

## 2014-04-17 SURGERY — ARTERIOVENOUS (AV) FISTULA CREATION
Anesthesia: Monitor Anesthesia Care | Site: Arm Upper | Laterality: Left

## 2014-04-17 MED ORDER — PROPOFOL 10 MG/ML IV BOLUS
INTRAVENOUS | Status: AC
  Filled 2014-04-17: qty 20

## 2014-04-17 MED ORDER — LIDOCAINE-EPINEPHRINE (PF) 1 %-1:200000 IJ SOLN
INTRAMUSCULAR | Status: AC
  Filled 2014-04-17: qty 10

## 2014-04-17 MED ORDER — SODIUM CHLORIDE 0.9 % IV SOLN
INTRAVENOUS | Status: DC
  Administered 2014-04-17: 10:00:00 via INTRAVENOUS

## 2014-04-17 MED ORDER — MIDAZOLAM HCL 5 MG/5ML IJ SOLN
INTRAMUSCULAR | Status: DC | PRN
  Administered 2014-04-17: 1 mg via INTRAVENOUS

## 2014-04-17 MED ORDER — FENTANYL CITRATE 0.05 MG/ML IJ SOLN
INTRAMUSCULAR | Status: AC
  Filled 2014-04-17: qty 5

## 2014-04-17 MED ORDER — CHLORHEXIDINE GLUCONATE CLOTH 2 % EX PADS: 6.0000 | MEDICATED_PAD | Freq: Once | CUTANEOUS | Status: DC

## 2014-04-17 MED ORDER — 0.9 % SODIUM CHLORIDE (POUR BTL) OPTIME
TOPICAL | Status: DC | PRN
  Administered 2014-04-17: 1000 mL

## 2014-04-17 MED ORDER — LIDOCAINE-EPINEPHRINE (PF) 1 %-1:200000 IJ SOLN
INTRAMUSCULAR | Status: DC | PRN
  Administered 2014-04-17: 30 mL

## 2014-04-17 MED ORDER — LIDOCAINE HCL (CARDIAC) 20 MG/ML IV SOLN
INTRAVENOUS | Status: AC
  Filled 2014-04-17: qty 5

## 2014-04-17 MED ORDER — ONDANSETRON HCL 4 MG/2ML IJ SOLN
INTRAMUSCULAR | Status: AC
  Filled 2014-04-17: qty 2

## 2014-04-17 MED ORDER — PROPOFOL INFUSION 10 MG/ML OPTIME
INTRAVENOUS | Status: DC | PRN
  Administered 2014-04-17: 50 ug/kg/min via INTRAVENOUS

## 2014-04-17 MED ORDER — SODIUM CHLORIDE 0.9 % IR SOLN
Status: DC | PRN
  Administered 2014-04-17: 12:00:00

## 2014-04-17 MED ORDER — SODIUM CHLORIDE 0.9 % IV SOLN
INTRAVENOUS | Status: DC | PRN
  Administered 2014-04-17: 12:00:00 via INTRAVENOUS

## 2014-04-17 MED ORDER — MIDAZOLAM HCL 2 MG/2ML IJ SOLN
INTRAMUSCULAR | Status: AC
  Filled 2014-04-17: qty 2

## 2014-04-17 MED ORDER — FENTANYL CITRATE 0.05 MG/ML IJ SOLN
INTRAMUSCULAR | Status: DC | PRN
  Administered 2014-04-17 (×3): 50 ug via INTRAVENOUS

## 2014-04-17 MED ORDER — OXYCODONE HCL 5 MG PO TABS: 5.0000 mg | ORAL_TABLET | Freq: Four times a day (QID) | ORAL | Status: DC | PRN

## 2014-04-17 SURGICAL SUPPLY — 37 items
ARMBAND PINK RESTRICT EXTREMIT (MISCELLANEOUS) ×3 IMPLANT
BLADE SURG 10 STRL SS (BLADE) ×3 IMPLANT
CANISTER SUCTION 2500CC (MISCELLANEOUS) ×3 IMPLANT
CLIP TI MEDIUM 6 (CLIP) ×3 IMPLANT
CLIP TI WIDE RED SMALL 6 (CLIP) ×3 IMPLANT
COVER PROBE W GEL 5X96 (DRAPES) ×3 IMPLANT
COVER SURGICAL LIGHT HANDLE (MISCELLANEOUS) ×3 IMPLANT
DRAIN PENROSE 1/4X12 LTX STRL (WOUND CARE) IMPLANT
ELECT REM PT RETURN 9FT ADLT (ELECTROSURGICAL) ×3
ELECTRODE REM PT RTRN 9FT ADLT (ELECTROSURGICAL) ×1 IMPLANT
GEL ULTRASOUND 20GR AQUASONIC (MISCELLANEOUS) IMPLANT
GLOVE BIO SURGEON STRL SZ 6.5 (GLOVE) ×6 IMPLANT
GLOVE BIO SURGEON STRL SZ7.5 (GLOVE) ×3 IMPLANT
GLOVE BIO SURGEONS STRL SZ 6.5 (GLOVE) ×3
GLOVE BIOGEL PI IND STRL 6.5 (GLOVE) ×2 IMPLANT
GLOVE BIOGEL PI IND STRL 7.0 (GLOVE) ×1 IMPLANT
GLOVE BIOGEL PI IND STRL 7.5 (GLOVE) ×1 IMPLANT
GLOVE BIOGEL PI INDICATOR 6.5 (GLOVE) ×4
GLOVE BIOGEL PI INDICATOR 7.0 (GLOVE) ×2
GLOVE BIOGEL PI INDICATOR 7.5 (GLOVE) ×2
GLOVE SS BIOGEL STRL SZ 7 (GLOVE) ×1 IMPLANT
GLOVE SUPERSENSE BIOGEL SZ 7 (GLOVE) ×2
GLOVE SURG SS PI 6.5 STRL IVOR (GLOVE) ×3 IMPLANT
GOWN STRL REUS W/ TWL LRG LVL3 (GOWN DISPOSABLE) ×4 IMPLANT
GOWN STRL REUS W/TWL LRG LVL3 (GOWN DISPOSABLE) ×8
KIT BASIN OR (CUSTOM PROCEDURE TRAY) ×3 IMPLANT
KIT ROOM TURNOVER OR (KITS) ×3 IMPLANT
LIQUID BAND (GAUZE/BANDAGES/DRESSINGS) ×3 IMPLANT
NS IRRIG 1000ML POUR BTL (IV SOLUTION) ×3 IMPLANT
PACK CV ACCESS (CUSTOM PROCEDURE TRAY) ×3 IMPLANT
PAD ARMBOARD 7.5X6 YLW CONV (MISCELLANEOUS) ×6 IMPLANT
PROBE PENCIL 8 MHZ STRL DISP (MISCELLANEOUS) ×3 IMPLANT
SUT PROLENE 6 0 BV (SUTURE) ×3 IMPLANT
SUT VIC AB 3-0 SH 27 (SUTURE) ×2
SUT VIC AB 3-0 SH 27X BRD (SUTURE) ×1 IMPLANT
UNDERPAD 30X30 INCONTINENT (UNDERPADS AND DIAPERS) ×3 IMPLANT
WATER STERILE IRR 1000ML POUR (IV SOLUTION) ×3 IMPLANT

## 2014-04-17 NOTE — Anesthesia Postprocedure Evaluation (Signed)
Anesthesia Post Note  Patient: [name]  Procedure(s) Performed: Procedure(s) (LRB): ARTERIOVENOUS (AV) FISTULA CREATION- LEFT UPPER ARM  (Left)  Anesthesia type: MAC  Patient location: PACU  Post pain: Pain level controlled  Post assessment: Post-op Vital signs reviewed  Last Vitals: BP 112/89 mmHg  Pulse 94  Temp(Src) 36.5 C (Oral)  Resp 14  Ht 5\' 8"  (1.727 m)  Wt 173 lb (78.472 kg)  BMI 26.31 kg/m2  SpO2 100%  Post vital signs: Reviewed  Level of consciousness: awake  Complications: No apparent anesthesia complications

## 2014-04-17 NOTE — Progress Notes (Signed)
Dentures returned to pt per request

## 2014-04-17 NOTE — Transfer of Care (Signed)
Immediate Anesthesia Transfer of Care Note  Patient: [name]  Procedure(s) Performed: Procedure(s): ARTERIOVENOUS (AV) FISTULA CREATION- LEFT UPPER ARM  (Left)  Patient Location: PACU  Anesthesia Type:MAC  Level of Consciousness: awake, alert , oriented and patient cooperative  Airway & Oxygen Therapy: Patient Spontanous Breathing and Patient connected to nasal cannula oxygen  Post-op Assessment: Report given to PACU RN, Post -op Vital signs reviewed and stable and Patient moving all extremities  Post vital signs: Reviewed and stable  Complications: No apparent anesthesia complications

## 2014-04-17 NOTE — Anesthesia Preprocedure Evaluation (Addendum)
Anesthesia Evaluation  Patient identified by MRN, date of birth, ID band Patient awake    Reviewed: Allergy & Precautions, H&P , NPO status , Patient's Chart, lab work & pertinent test results  Airway Mallampati: II  TM Distance: >3 FB Neck ROM: Full    Dental no notable dental hx.    Pulmonary shortness of breath, former smoker,  breath sounds clear to auscultation  Pulmonary exam normal       Cardiovascular hypertension, Pt. on medications + dysrhythmias Atrial Fibrillation + Valvular Problems/Murmurs AS Rhythm:Regular Rate:Normal  Echo 10/2013 - Left ventricle: The cavity size was normal. There was mildconcentric hypertrophy. Systolic function was normal. Theestimated ejection fraction was in the range of 55% to 60%. Wallmotion was normal; there were no regional wall motionabnormalities. Features are consistent with a pseudonormal leftventricular filling pattern, with concomitant abnormal relaxationand increased filling pressure (grade 2 diastolic dysfunction). - Aortic valve: There was mild to moderate stenosis. There wastrivial regurgitation. There was no significant perivalvularregurgitation. Valve area (VTI): 1.45 cm^2. Valve area (Vmax):1.22 cm^2. - Mitral valve: Severely calcified annulus. The findings are consistent with trivial stenosis. There was mild regurgitation.Valve area by pressure half-time: 2.16 cm^2. Valve area bycontinuity equation (using LVOT flow): 1.41 cm^2. - Left atrium: The atrium was severely dilated. - Right atrium: The atrium was mildly dilated.    Neuro/Psych negative neurological ROS  negative psych ROS   GI/Hepatic negative GI ROS, Neg liver ROS,   Endo/Other  negative endocrine ROS  Renal/GU Renal disease     Musculoskeletal  (+) Arthritis -,   Abdominal   Peds  Hematology negative hematology ROS (+)   Anesthesia Other Findings   Reproductive/Obstetrics                             Anesthesia Physical Anesthesia Plan  ASA: III  Anesthesia Plan: MAC   Post-op Pain Management:    Induction: Intravenous  Airway Management Planned:   Additional Equipment:   Intra-op Plan:   Post-operative Plan:   Informed Consent: I have reviewed the patients History and Physical, chart, labs and discussed the procedure including the risks, benefits and alternatives for the proposed anesthesia with the patient or authorized representative who has indicated his/her understanding and acceptance.   Dental advisory given  Plan Discussed with: CRNA  Anesthesia Plan Comments:         Anesthesia Quick Evaluation

## 2014-04-17 NOTE — Interval H&P Note (Signed)
History and Physical Interval Note:  04/17/2014 11:55 AM  [name]  has presented today for surgery, with the diagnosis of Chronic kidney disease  The various methods of treatment have been discussed with the patient and family. After consideration of risks, benefits and other options for treatment, the patient has consented to  Procedure(s): ARTERIOVENOUS (AV) FISTULA CREATION- LEFT ARM  (Left) as a surgical intervention .  The patient's history has been reviewed, patient examined, no change in status, stable for surgery.  I have reviewed the patient's chart and labs.  Questions were answered to the patient's satisfaction.     Tinnie Gens

## 2014-04-17 NOTE — H&P (View-Only) (Signed)
Patient name: [name] MRN: [id number] DOB: 1941/03/24 Sex: male   Referred by: Dr. Detterding  Reason for referral:  Chief Complaint  Patient presents with  . New Evaluation    REF- Dr.Deterding  Not on HD yet - Kidney are stable now     HISTORY OF PRESENT ILLNESS:  This is a 73 year old gentleman that comes in today for discussions of dialysis access.  His renal failure secondary to hypertension.  It is complicated by secondary hyperparathyroidism and anemia of chronic disease.  The patient states that he is ambidextrous but does most activities with his right hand.  The patient suffers from a total fibrillation and is on chronic anticoagulation.  He has a history of bladder cancer and is status post cystectomy with ileal conduit.  He also has aortic stenosis.  Past Medical History  Diagnosis Date  . Paroxysmal atrial fibrillation     cardioversion 11/21/10 - Dr Radford Pax  . Hypertension   . Warfarin anticoagulation   . Renal insufficiency     followed by Dr Jimmy Footman  . Hypertrophic obstructive cardiomyopathy   . Bladder cancer   . Aortic stenosis, moderate     echo 10/2012  . Orthostatic hypotension   . Diastolic dysfunction     Past Surgical History  Procedure Laterality Date  . Cardioversion  11/21/2010  . Cystectomy    . Cholecystectomy      History   Social History  . Marital Status: Married    Spouse Name: N/A    Number of Children: N/A  . Years of Education: N/A   Occupational History  . Not on file.   Social History Main Topics  . Smoking status: Former Smoker -- 2.00 packs/day for 33 years    Types: Cigarettes    Quit date: 05/02/1983  . Smokeless tobacco: Never Used  . Alcohol Use: No  . Drug Use: No  . Sexual Activity: Not on file   Other Topics Concern  . Not on file   Social History Narrative    Family History  Problem Relation Age of Onset  . CVA Mother   . Heart attack Father   . Heart disease Father   . Arrhythmia Father    . Alzheimer's disease Sister   . Multiple sclerosis Brother   . Alcohol abuse Father     Allergies as of 03/30/2014  . (No Known Allergies)    Current Outpatient Prescriptions on File Prior to Visit  Medication Sig Dispense Refill  . amLODipine (NORVASC) 10 MG tablet TAKE 1 TABLET BY MOUTH EVERY DAY. 90 tablet 3  . B Complex-C (SUPER B COMPLEX PO) Take 1 capsule by mouth daily.     . calcitRIOL (ROCALTROL) 0.25 MCG capsule Take 0.25 mcg by mouth daily.    . cholecalciferol (VITAMIN D) 1000 UNITS tablet Take 1,000 Units by mouth daily.    . Cinnamon 500 MG capsule Take 2,000 mg by mouth daily.     . Glucosamine-Chondroit-Vit C-Mn (GLUCOSAMINE-CHONDROITIN) TABS Take 2 tablets by mouth daily.     . Multiple Vitamins-Minerals (CENTRUM SILVER ULTRA MENS PO) Take by mouth. Take daily    . sodium bicarbonate 650 MG tablet Take 650 mg by mouth 4 (four) times daily. Take 2 tabs twice a day    . warfarin (COUMADIN) 5 MG tablet Take as directed by coumadin clinic 90 tablet 1   No current facility-administered medications on file prior to visit.     REVIEW OF SYSTEMS: Cardiovascular:  No chest pain, chest pressure, palpitations, orthopnea, or dyspnea on exertion. No claudication or rest pain,  No history of DVT or phlebitis.  Positive leg swelling Pulmonary: No productive cough, asthma or wheezing. Neurologic: No weakness, paresthesias, aphasia, or amaurosis. No dizziness. Hematologic: No bleeding problems or clotting disorders. Musculoskeletal: No joint pain or joint swelling. Gastrointestinal: No blood in stool or hematemesis Genitourinary: No dysuria or hematuria. Psychiatric:: No history of major depression. Integumentary: No rashes or ulcers. Constitutional: No fever or chills.  PHYSICAL EXAMINATION: General: The patient appears their stated age.  Vital signs are BP 151/96 mmHg  Pulse 54  Temp(Src) 97.3 F (36.3 C) (Oral)  Resp 16  Ht 5\' 8"  (1.727 m)  Wt 173 lb (78.472 kg)   BMI 26.31 kg/m2  SpO2 100% HEENT:  No gross abnormalities Pulmonary: Respirations are non-labored Abdomen: Soft and non-tender  Musculoskeletal: There are no major deformities.   Neurologic: No focal weakness or paresthesias are detected, Skin: There are no ulcer or rashes noted. Psychiatric: The patient has normal affect. Cardiovascular: There is a regular rate and rhythm without significant murmur appreciated.  Palpable left brachial and radial pulse  Diagnostic Studies: Vein mapping was reviewed.  This shows an adequate bilateral cephalic veins from the mid forearm proximally.  Abnormal Allen test on the left   Assessment:  End-stage renal disease, stage IV Plan: The patient requests a fistula in his left arm.  He does have an abnormal Allen's test, however I think his risk for steal syndrome is minimal.  I did discuss this risk with him.  I also discussed the risk of non-maturity and the need for future interventions.  He is a candidate for a left brachiocephalic fistula.  I will evaluate his cephalic vein in the forearm to make sure that a radiocephalic fistula cannot be placed.  I have scheduled his operation for Friday, December 18.  He will need to be off of his Coumadin for 5 days prior to his operation.  I will discuss with his cardiologist, Dr. Radford Pax whether or not he needs a Lovenox bridge.     Eldridge Abrahams, M.D. [facility] [place] Office: [phone] Pager:  [phone]

## 2014-04-17 NOTE — Discharge Instructions (Signed)
° ° °  04/17/2014 [name] [id number] [birth year]  Surgeon(s): Mal [name], MD  Procedure(s): ARTERIOVENOUS (AV) FISTULA CREATION- LEFT UPPER ARM    Do not stick fistula for 12 weeks

## 2014-04-17 NOTE — Progress Notes (Signed)
This patient has been given discharge instructions by this RN.  I used a variety of media:  Verbal, Handouts and Teachback.  The patients family/friends were also included with discharge instructions.  Belongings were returned to the patient.  This patients pain is tolerable and is able to tolerate fluids.  Prescriptions if available were given to the family and/or the patient with instructions for taking.

## 2014-04-17 NOTE — Op Note (Signed)
OPERATIVE REPORT  Date of Surgery: 04/17/2014  Surgeon: Tinnie Gens, MD  Assistant: Leontine Locket PA  Pre-op Diagnosis: Chronic kidney disease-stage IV  Post-op Diagnosis: Chronic kidney disease  Procedure: Procedure(s): ARTERIOVENOUS (AV) FISTULA CREATION- LEFT UPPER ARM -left brachial-cephalic  Anesthesia: Mac  EBL: Minimal  Complications: None  Procedure Details: Patient was taken to the operative placed in supine position at which time left upper extremity was prepped Betadine scrub and solution draped in routine sterile manner. After infiltration forms and Xylocaine with epinephrine transverse incision was made in the antecubital area. Antecubital vein dissected free. Cephalic branch was excellent being at least 3 mm in size. It was ligated distally transected gently dilated with heparinized saline marked for orientation purposes. Brachial artery was exposed beneath the fashion circled Vesseloops it was an excellent vessel with 3+ pulse. It was then occluded proximally and distally with Vesseloops open 15 blade extended with Potts scissors. The vein was carefully spatulated and anastomosed inside with 6-0 Prolene. Vessel loops were released there was an excellent pulse and palpable thrill in the fistula area there was good radial and present ulnar arterial flow distally with the fistula opening which did improve with compression of the fistula. Adequate hemostasis was achieved wound closed in layers with Vicryl in a subcuticular fashion with Dermabond patient taken to recovery room in stable condition   Tinnie Gens, MD 04/17/2014 1:22 PM

## 2014-04-17 NOTE — Telephone Encounter (Addendum)
-----   Message from Mena Goes, RN sent at 04/17/2014  1:54 PM EST ----- Regarding: Schedule   ----- Message -----    From: Gabriel Earing, PA-C    Sent: 04/17/2014   1:19 PM      To: [facility] Charge Pool  S/p left BC AVF 04/17/14.  F/u with Dr. Kellie Simmering in 6 weeks with duplex.  Thanks, Aldona Bar  04/17/14: spoke with pt, mailed letter, dpm

## 2014-04-20 ENCOUNTER — Telehealth: Payer: Self-pay

## 2014-04-20 NOTE — Telephone Encounter (Signed)
Phone call from pt.  Reported he has "an irritable itch" on bilateral forearms.  Unable to detect a rash, due to the bruising from the Warfarin.  Stated he has been using Cortisone 10 topically.  Stated that this seems to help approx. 1 hr., then it comes back.  Reported he used a total of Oxycodone tabs for the pain, after discharge.  Stated he has not taken any more, but thinks the itching may have started after the Oxycodone.  Discussed with Dr. Kellie Simmering. Recommended to have pt. try Benadryl OTC for the itching.  Pt. advised to try Benadryl per package directions, and to call office if no improvement in symptoms.  Verb. Understanding.

## 2014-04-21 ENCOUNTER — Encounter (HOSPITAL_COMMUNITY): Payer: Self-pay | Admitting: Vascular Surgery

## 2014-04-27 ENCOUNTER — Telehealth: Payer: Self-pay

## 2014-04-27 ENCOUNTER — Telehealth: Payer: Self-pay | Admitting: Cardiology

## 2014-04-27 ENCOUNTER — Ambulatory Visit (INDEPENDENT_AMBULATORY_CARE_PROVIDER_SITE_OTHER): Payer: MEDICARE

## 2014-04-27 DIAGNOSIS — I4891 Unspecified atrial fibrillation: Secondary | ICD-10-CM

## 2014-04-27 LAB — POCT INR: INR: 1.7

## 2014-04-27 NOTE — Telephone Encounter (Signed)
See other telephone encounter.

## 2014-04-27 NOTE — Telephone Encounter (Signed)
Left message to call back  

## 2014-04-27 NOTE — Telephone Encounter (Signed)
New message     Patient calling   Pt c/o Shortness Of Breath: STAT if SOB developed within the last 24 hours or pt is noticeably SOB on the phone  1. Are you currently SOB (can you hear that pt is SOB on the phone)? No   2. How long have you been experiencing SOB? About a week    3. Are you SOB when sitting or when up moving around? Does matter per pt stating    4. Are you currently experiencing any other symptoms? No , no pain in chest

## 2014-04-27 NOTE — Telephone Encounter (Signed)
Patient in office today for Coumadin Clinic appointment. See other note encounter 12/28 for details.

## 2014-04-27 NOTE — Telephone Encounter (Signed)
Patient in office today for PT/INR. He reports some Sob that he's had since after having his AV fistula inserted. Had some itching of forearms after taking 2 oxycodone. No swelling of lips tongue, or throat. Also has a slight cough accompanying this sob, but denies any URI symptoms. Does not appear to be in acute distress. Appointment made with flex for Tuesday 04/28/2014

## 2014-04-28 ENCOUNTER — Encounter: Payer: Self-pay | Admitting: Physician Assistant

## 2014-04-28 ENCOUNTER — Ambulatory Visit (INDEPENDENT_AMBULATORY_CARE_PROVIDER_SITE_OTHER): Payer: MEDICARE | Admitting: Physician Assistant

## 2014-04-28 VITALS — BP 138/80 | HR 124 | Ht 68.0 in | Wt 176.0 lb

## 2014-04-28 DIAGNOSIS — I422 Other hypertrophic cardiomyopathy: Secondary | ICD-10-CM

## 2014-04-28 DIAGNOSIS — N185 Chronic kidney disease, stage 5: Secondary | ICD-10-CM

## 2014-04-28 DIAGNOSIS — I4891 Unspecified atrial fibrillation: Secondary | ICD-10-CM

## 2014-04-28 DIAGNOSIS — R0602 Shortness of breath: Secondary | ICD-10-CM

## 2014-04-28 DIAGNOSIS — I472 Ventricular tachycardia: Secondary | ICD-10-CM

## 2014-04-28 DIAGNOSIS — R001 Bradycardia, unspecified: Secondary | ICD-10-CM

## 2014-04-28 DIAGNOSIS — I350 Nonrheumatic aortic (valve) stenosis: Secondary | ICD-10-CM | POA: Diagnosis not present

## 2014-04-28 DIAGNOSIS — I4729 Other ventricular tachycardia: Secondary | ICD-10-CM

## 2014-04-28 MED ORDER — DILTIAZEM HCL ER COATED BEADS 180 MG PO CP24: 180.0000 mg | ORAL_CAPSULE | Freq: Every day | ORAL | Status: DC

## 2014-04-28 NOTE — Patient Instructions (Signed)
Your physician has recommended you make the following change in your medication:   STOP AMLODIPINE   START DILTIAZEM 180 MG DAILY   LABS TODAY BMET CBC TSH     FOLLOW UP WITH [facility] 04/30/14   AND WORKED IN TO SEE  DR Lovena Le ON  05/05/14

## 2014-04-28 NOTE — Progress Notes (Addendum)
[place], [place],   [address] Phone: [phone] Fax:  [phone]  Date:  04/28/2014   Patient ID:  [name], [name] [birth year], MRN [id number]   PCP:  Pcp Not In System  Cardiologist:  Spicer Electrophysiologist:  Lovena Le  History of Present Illness: [name] is a 73 y.o. male with history of persistent atrial fibrillation, junctional bradycardia, HTN, reported prior HOCM (although last echo 10/2013 - mild LVH), orthostatic hypotension, HTN, mild-mod AS (by echo 10/2013) who presents today for evaluation of SOB.  To recap history, he has a remote history of DCCV and was previously on amiodarone. He had a normal nuc in 2012. He was seen 05/2013 with increased DOE. A 24 Hour Holter monitor showed an average HR of 49bpm with max HR 82bpm and min HR 38bpm. He underwent ETT to assess chronotropic response to exercise which showed baseline junctional bradycardia and HR only increased to 90bpm with exercise with a drop in BP. He was referred to EP and was seen by Dr. Lovena Le. His amio was decreased to 200mg  daily and ultimately stopped due to severely reduced DLCO. He had significant improvement in SOB after cessation. More recently he saw Dr. Lovena Le 01/2014 for f/u of his AF as well as an episode of NSVT seen on another event monitor. His AF was rate controlled. Given his renal dysfunction Dr. Lovena Le recommended rate control only. He felt that his comorbidities including near end-stage renal disease pending dialysis made him a very poor candidate for ICD implantation. The patient says he is not yet at the point where he requires dialysis and follows with Dr. Jimmy [name] regularly, last seen 1.5 months ago. Last Cr in EPIC is 4.5 on 07/22/13 which was up from prior value of 2.3.   [name] underwent AV fistula placement 04/17/14. HR documented as 94 at that time. He was prescribed oxycodone as pain management afterwards but developed itching. He tried to continue the  medicine and manage the itching with hydrocortisone and Aveeno but eventually had to discontinue the oxycodone. The itching resolved. He did not have any swelling, rash or sensation of throat closure. Over the last several days he has noticed increased dyspnea both at rest and with exertion. When he lays down at night he feels his heart pounding. He has not had any chest pain, change in urination, LEE, bleeding, pre-syncope or syncope. In clinic he was found to be in AF RVR with HR 120's.  Recent Labs: 05/26/2013: ALT 21; TSH 3.79 07/22/2013: Creatinine 4.5*; Pro B Natriuretic peptide (BNP) 580.0* 04/17/2014: Hemoglobin 13.6; Potassium 4.5  Wt Readings from Last 3 Encounters:  04/28/14 176 lb (79.833 kg)  04/17/14 173 lb (78.472 kg)  03/30/14 173 lb (78.472 kg)     Past Medical History  Diagnosis Date  . Paroxysmal atrial fibrillation     a. cardioversion 11/21/10 - Dr Radford Pax. b. 05/2013 - experiencing more SOB. 24-hr holter showed avg HR 49, max 82, min 38bpm. ETT showed baseline junctional bradycardia, HR incr only to 90bpm with drop in BP. Saw EP - amiodarone reduced and eventually d/c'd with improvement in dyspnea.  . Hypertension   . Warfarin anticoagulation   . CKD (chronic kidney disease), stage V     a. Followed by Dr. Jimmy [name]. b. IV fistula placed 04/17/14.  [name] Hypertrophic obstructive cardiomyopathy     a. Echo 10/2013 actually showed mild concentric LVH.  [name] Bladder cancer     a. s/p bladder resection,  surgery to recreate pouch from colon.  . Aortic valve stenosis     a. 10/2013 - mild to moderate stenosis by echo.  . Orthostatic hypotension   . Diastolic dysfunction   . Arthritis   . Self-catheterizes urinary bladder   . Junctional bradycardia   . NSVT (nonsustained ventricular tachycardia)     a. By holter 01/2014.    Current Outpatient Prescriptions  Medication Sig Dispense Refill  . B Complex-C (SUPER B COMPLEX PO) Take 1 capsule by mouth daily.     . calcitRIOL  (ROCALTROL) 0.25 MCG capsule Take 0.25-0.5 mcg by mouth daily. Alternate 1 or 2 capsules daily    . cholecalciferol (VITAMIN D) 1000 UNITS tablet Take 1,000 Units by mouth daily.    [name] CINNAMON PO Take 2,000 mg by mouth daily.    . Misc Natural Products (OSTEO BI-FLEX ADV TRIPLE ST PO) Take 1 tablet by mouth daily.     . Multiple Vitamins-Minerals (CENTRUM SILVER ULTRA MENS PO) Take 1 tablet by mouth daily. Take daily    . sodium bicarbonate 650 MG tablet Take 1,300 mg by mouth 2 (two) times daily.     [name] warfarin (COUMADIN) 5 MG tablet Take as directed by coumadin clinic (Patient taking differently: Take 2.5 mg by mouth daily. Take as directed by coumadin clinic) 90 tablet 1  . Amlodipine (NORVASC) 10 MG tablet Take 1 capsule (10 mg total) by mouth daily.     No current facility-administered medications for this visit.    Allergies:   Oxycodone hcl   Social History:  The patient  reports that he quit smoking about 31 years ago. His smoking use included Cigarettes. He has a 66 pack-year smoking history. He has never used smokeless tobacco. He reports that he does not drink alcohol or use illicit drugs.   Family History:  The patient's family history includes Alcohol abuse in his father; Alzheimer's disease in his sister; Arrhythmia in his father; CVA in his mother; Heart attack in his father; Heart disease in his father; Multiple sclerosis in his brother.   ROS:  Please see the history of present illness. All other systems reviewed and negative.   PHYSICAL EXAM:  VS:  BP 138/80 mmHg  Pulse 124  Ht 5\' 8"  (1.727 m)  Wt 176 lb (79.833 kg)  BMI 26.77 kg/m2  SpO2 92% Well nourished, well developed WM, in no acute distress HEENT: normal, oropharynx clear without any edema, no lip swelling Neck: no JVD Cardiac:  normal S1, S2; irregularly irregular, tachycardic, 2/6 SEM RUSB Lungs:  clear to auscultation bilaterally, no wheezing, rhonchi or rales - speaks in full sentences without dyspnea Abd:  soft, nontender, no hepatomegaly Ext: no edema Skin: warm and dry, no rash Neuro:  moves all extremities spontaneously, no focal abnormalities noted  EKG:  Atrial fib with RVR 124bpm, incomplete RBBB, nonspecific inferolateral ST-T changes  ASSESSMENT AND PLAN:  1. Persistent atrial fibrillation with RVR likely causing his dypsnea - Dr. Radford Pax evaluated the patient with me. We recommended admission to the hospital given tachycardia and complex history to include advanced renal disease, aortic stenosis, and prior junctional bradycardia. The patient reports he has a Clinical cytogeneticist that he absolutely must attend. We have tried to persuade him otherwise but he absolutely refused to consider this. He says he accepts the risk of worsened clinical status including hypotension, heart failure and even death. We have instructed him not to drive. I wonder if his AF RVR was precipitated by  his allergic reaction to oxycodone which has since resolved. Since he is refusing to be admitted, Dr. Radford Pax recommended to discontinue amlodipine and start diltiazem 180mg  daily with close outpatient follow-up on Thursday. Will also check stat labs today - I have signed out to the PA on call this evening. If there is anything significantly abnormal he will need to be told again to go to the hospital, although he presently refuses anyway. Will bring him back to clinic on Thursday 04/30/14. If his HR is still elevated then, he says he would be agreeable to hospitalization at that time. If HR is controlled, we have already asked scheduling to arrange a follow-up with Dr. Lovena Le early next week on 05/05/14. He was advised to seek care in the ED immediately if he changes his mind or begins to feel worse. 2. CKD stage IV-V with recent AV fistula placement - recheck BMET today. We told him we are concerned that AF RVR may precipitate worsened renal function which could expedite need for dialysis. He understands this but still  refuses to go to the hospital today. Continue Coumadin as directed by Coumadin clinic. 3. Mild-moderate aortic stenosis - noted by echo 10/2013. 4. Prior h/o HOCM - not really seen on 10/2013 echo. 5. H/o junctional bradycardia - see above. Patient knows to observe closely for symptoms of low HR. 6. NSVT - not seen on today's EKG.  Dispo: F/u Richardson Dopp PA-C on 04/30/14. If HR is noted to still be elevated at that time, he needs to be sent to the hospital for admission. We have also asked that he be worked in to see Dr. Lovena Le on 05/05/14.  Signed, Melina Copa, PA-C  04/28/2014 5:18 PM   Addendum 04/29/14: Labs reviewed. Creatinine is significantly worse than prior labs in Epic at 5.1. Last available level was 4.5 in 06/2013 but mid 2's before then. No available labs in the last few months. This is clearly concerning in the setting of his AF RVR. Yesterday he refused hospital admission despite warnings about adverse outcomes including death. I talked to Dr. Radford Pax and again we feel he needs to go to the hospital ASAP. I spoke with Julaine Hua to call him (and his emergency contacts if he doesn't answer) to reinforce that he needs to go to the hospital TODAY. Waiting until tomorrow is not an option and it doesn't even make sense at this point for him to come back to the office. His next evaluation needs to be in the ER.  I called Trish at [facility] to explain the situation and keep her in the loop. She will be passing on to our hospital team today. It is not yet clear who will need to be the admitting party. Dayna Dunn PA-C

## 2014-04-29 ENCOUNTER — Emergency Department (HOSPITAL_COMMUNITY): Payer: MEDICARE

## 2014-04-29 ENCOUNTER — Inpatient Hospital Stay (HOSPITAL_COMMUNITY)
Admission: EM | Admit: 2014-04-29 | Discharge: 2014-05-02 | DRG: 308 | Disposition: A | Discharge status: Home / Self Care | Payer: MEDICARE | Attending: Internal Medicine | Admitting: Internal Medicine

## 2014-04-29 ENCOUNTER — Encounter (HOSPITAL_COMMUNITY): Payer: Self-pay | Admitting: *Deleted

## 2014-04-29 ENCOUNTER — Telehealth: Payer: Self-pay | Admitting: *Deleted

## 2014-04-29 DIAGNOSIS — R0600 Dyspnea, unspecified: Secondary | ICD-10-CM | POA: Diagnosis not present

## 2014-04-29 DIAGNOSIS — D638 Anemia in other chronic diseases classified elsewhere: Secondary | ICD-10-CM | POA: Diagnosis present

## 2014-04-29 DIAGNOSIS — Z8249 Family history of ischemic heart disease and other diseases of the circulatory system: Secondary | ICD-10-CM

## 2014-04-29 DIAGNOSIS — Z823 Family history of stroke: Secondary | ICD-10-CM | POA: Diagnosis not present

## 2014-04-29 DIAGNOSIS — Z7901 Long term (current) use of anticoagulants: Secondary | ICD-10-CM | POA: Diagnosis not present

## 2014-04-29 DIAGNOSIS — I4891 Unspecified atrial fibrillation: Secondary | ICD-10-CM

## 2014-04-29 DIAGNOSIS — R079 Chest pain, unspecified: Secondary | ICD-10-CM

## 2014-04-29 DIAGNOSIS — J9811 Atelectasis: Secondary | ICD-10-CM | POA: Diagnosis present

## 2014-04-29 DIAGNOSIS — Z87891 Personal history of nicotine dependence: Secondary | ICD-10-CM

## 2014-04-29 DIAGNOSIS — I248 Other forms of acute ischemic heart disease: Secondary | ICD-10-CM | POA: Diagnosis present

## 2014-04-29 DIAGNOSIS — I120 Hypertensive chronic kidney disease with stage 5 chronic kidney disease or end stage renal disease: Secondary | ICD-10-CM | POA: Diagnosis present

## 2014-04-29 DIAGNOSIS — I421 Obstructive hypertrophic cardiomyopathy: Secondary | ICD-10-CM | POA: Diagnosis present

## 2014-04-29 DIAGNOSIS — J90 Pleural effusion, not elsewhere classified: Secondary | ICD-10-CM | POA: Insufficient documentation

## 2014-04-29 DIAGNOSIS — J810 Acute pulmonary edema: Secondary | ICD-10-CM | POA: Diagnosis not present

## 2014-04-29 DIAGNOSIS — I5033 Acute on chronic diastolic (congestive) heart failure: Secondary | ICD-10-CM | POA: Diagnosis present

## 2014-04-29 DIAGNOSIS — I480 Paroxysmal atrial fibrillation: Principal | ICD-10-CM | POA: Diagnosis present

## 2014-04-29 DIAGNOSIS — Z992 Dependence on renal dialysis: Secondary | ICD-10-CM | POA: Diagnosis not present

## 2014-04-29 DIAGNOSIS — Z906 Acquired absence of other parts of urinary tract: Secondary | ICD-10-CM | POA: Diagnosis present

## 2014-04-29 DIAGNOSIS — E872 Acidosis, unspecified: Secondary | ICD-10-CM | POA: Insufficient documentation

## 2014-04-29 DIAGNOSIS — I420 Dilated cardiomyopathy: Secondary | ICD-10-CM | POA: Diagnosis not present

## 2014-04-29 DIAGNOSIS — N185 Chronic kidney disease, stage 5: Secondary | ICD-10-CM | POA: Diagnosis present

## 2014-04-29 DIAGNOSIS — N179 Acute kidney failure, unspecified: Secondary | ICD-10-CM | POA: Diagnosis not present

## 2014-04-29 DIAGNOSIS — N184 Chronic kidney disease, stage 4 (severe): Secondary | ICD-10-CM

## 2014-04-29 DIAGNOSIS — I350 Nonrheumatic aortic (valve) stenosis: Secondary | ICD-10-CM

## 2014-04-29 DIAGNOSIS — C679 Malignant neoplasm of bladder, unspecified: Secondary | ICD-10-CM | POA: Diagnosis present

## 2014-04-29 DIAGNOSIS — N189 Chronic kidney disease, unspecified: Secondary | ICD-10-CM

## 2014-04-29 DIAGNOSIS — I5032 Chronic diastolic (congestive) heart failure: Secondary | ICD-10-CM | POA: Diagnosis present

## 2014-04-29 LAB — CBC
HCT: 35.7 % — ABNORMAL LOW (ref 39.0–52.0)
HCT: 35.6 % — ABNORMAL LOW (ref 39.0–52.0)
HEMOGLOBIN: 11.7 g/dL — AB (ref 13.0–17.0)
HEMOGLOBIN: 11.6 g/dL — AB (ref 13.0–17.0)
MCH: 30.9 pg (ref 26.0–34.0)
MCHC: 32.7 g/dL (ref 30.0–36.0)
MCHC: 32.6 g/dL (ref 30.0–36.0)
MCV: 96.6 fl (ref 78.0–100.0)
MCV: 94.7 fL (ref 78.0–100.0)
PLATELETS: 279 10*3/uL (ref 150–400)
Platelets: 275 10*3/uL (ref 150.0–400.0)
RBC: 3.7 Mil/uL — ABNORMAL LOW (ref 4.22–5.81)
RBC: 3.76 MIL/uL — ABNORMAL LOW (ref 4.22–5.81)
RDW: 16.8 % — AB (ref 11.5–15.5)
RDW: 16.1 % — AB (ref 11.5–15.5)
WBC: 9.8 10*3/uL (ref 4.0–10.5)
WBC: 9.7 10*3/uL (ref 4.0–10.5)

## 2014-04-29 LAB — BASIC METABOLIC PANEL
ANION GAP: 12 (ref 5–15)
BUN: 69 mg/dL — ABNORMAL HIGH (ref 6–23)
BUN: 65 mg/dL — AB (ref 6–23)
CALCIUM: 9.3 mg/dL (ref 8.4–10.5)
CALCIUM: 9.4 mg/dL (ref 8.4–10.5)
CO2: 17 mEq/L — ABNORMAL LOW (ref 19–32)
CO2: 17 mmol/L — ABNORMAL LOW (ref 19–32)
CREATININE: 5.1 mg/dL — AB (ref 0.4–1.5)
CREATININE: 5.24 mg/dL — AB (ref 0.50–1.35)
Chloride: 109 mEq/L (ref 96–112)
Chloride: 112 mEq/L (ref 96–112)
GFR calc non Af Amer: 10 mL/min — ABNORMAL LOW (ref >90)
GFR, EST AFRICAN AMERICAN: 11 mL/min — AB (ref >90)
GFR: 11.84 mL/min — AB (ref >60.00)
GLUCOSE: 113 mg/dL — AB (ref 70–99)
GLUCOSE: 117 mg/dL — AB (ref 70–99)
POTASSIUM: 4.5 mmol/L (ref 3.5–5.1)
Potassium: 4.6 mEq/L (ref 3.5–5.1)
SODIUM: 141 mmol/L (ref 135–145)
Sodium: 139 mEq/L (ref 135–145)

## 2014-04-29 LAB — BRAIN NATRIURETIC PEPTIDE: B NATRIURETIC PEPTIDE 5: 1281.2 pg/mL — AB (ref 0.0–100.0)

## 2014-04-29 LAB — TROPONIN I: Troponin I: 0.04 ng/mL — ABNORMAL HIGH (ref <0.031)

## 2014-04-29 LAB — PROTIME-INR
INR: 2.16 — AB (ref 0.00–1.49)
Prothrombin Time: 24.3 seconds — ABNORMAL HIGH (ref 11.6–15.2)

## 2014-04-29 LAB — I-STAT TROPONIN, ED: Troponin i, poc: 0.04 ng/mL (ref 0.00–0.08)

## 2014-04-29 LAB — TSH: TSH: 2.46 u[IU]/mL (ref 0.35–4.50)

## 2014-04-29 LAB — MRSA PCR SCREENING: MRSA by PCR: NEGATIVE

## 2014-04-29 MED ORDER — CALCITRIOL 0.25 MCG PO CAPS
0.2500 ug | ORAL_CAPSULE | Freq: Every day | ORAL | Status: DC
  Administered 2014-04-30 – 2014-05-02 (×3): 0.25 ug via ORAL
  Filled 2014-04-29 (×3): qty 1

## 2014-04-29 MED ORDER — ACETAMINOPHEN 325 MG PO TABS: 650.0000 mg | ORAL_TABLET | Freq: Four times a day (QID) | ORAL | Status: DC | PRN

## 2014-04-29 MED ORDER — SODIUM CHLORIDE 0.9 % IJ SOLN
3.0000 mL | Freq: Two times a day (BID) | INTRAMUSCULAR | Status: DC
  Administered 2014-04-29 – 2014-05-02 (×6): 3 mL via INTRAVENOUS

## 2014-04-29 MED ORDER — ACETAMINOPHEN 650 MG RE SUPP: 650.0000 mg | Freq: Four times a day (QID) | RECTAL | Status: DC | PRN

## 2014-04-29 MED ORDER — SENNOSIDES-DOCUSATE SODIUM 8.6-50 MG PO TABS: 1.0000 | ORAL_TABLET | Freq: Every evening | ORAL | Status: DC | PRN

## 2014-04-29 MED ORDER — DEXTROSE 5 % IV SOLN
5.0000 mg/h | INTRAVENOUS | Status: DC
  Administered 2014-04-29: 5 mg/h via INTRAVENOUS

## 2014-04-29 MED ORDER — ONDANSETRON HCL 4 MG/2ML IJ SOLN: 4.0000 mg | Freq: Four times a day (QID) | INTRAMUSCULAR | Status: DC | PRN

## 2014-04-29 MED ORDER — SODIUM BICARBONATE 650 MG PO TABS
1300.0000 mg | ORAL_TABLET | Freq: Two times a day (BID) | ORAL | Status: DC
  Administered 2014-04-29 – 2014-05-02 (×6): 1300 mg via ORAL
  Filled 2014-04-29 (×7): qty 2

## 2014-04-29 MED ORDER — ONDANSETRON HCL 4 MG PO TABS: 4.0000 mg | ORAL_TABLET | Freq: Four times a day (QID) | ORAL | Status: DC | PRN

## 2014-04-29 MED ORDER — VITAMIN D3 25 MCG (1000 UNIT) PO TABS
1000.0000 [IU] | ORAL_TABLET | Freq: Every day | ORAL | Status: DC
  Administered 2014-04-29 – 2014-05-02 (×4): 1000 [IU] via ORAL
  Filled 2014-04-29 (×4): qty 1

## 2014-04-29 MED ORDER — DILTIAZEM LOAD VIA INFUSION
10.0000 mg | Freq: Once | INTRAVENOUS | Status: AC
  Administered 2014-04-29: 10 mg via INTRAVENOUS
  Filled 2014-04-29: qty 10

## 2014-04-29 NOTE — Progress Notes (Signed)
ANTICOAGULATION CONSULT NOTE - Initial Consult  Pharmacy Consult for coumadin Indication: atrial fibrillation  Allergies  Allergen Reactions  . Amiodarone     Severely reduced DLCO  . Oxycodone Hcl Rash and Other (See Comments)    Rash and itching    Patient Measurements:   Heparin Dosing Weight:   Vital Signs: Temp: 97.9 F (36.6 C) (12/30 1922) Temp Source: Oral (12/30 1922) BP: 157/92 mmHg (12/30 1922) Pulse Rate: 113 (12/30 1922)  Labs:  Recent Labs  04/27/14 1420 04/28/14 1700 04/29/14 1616 04/29/14 1640  HGB  --  11.7* 11.6*  --   HCT  --  35.7* 35.6*  --   PLT  --  275.0 279  --   LABPROT  --   --   --  24.3*  INR 1.7  --   --  2.16*  CREATININE  --  5.1* 5.24*  --     Estimated Creatinine Clearance: 12 mL/min (by C-G formula based on Cr of 5.24).   Medical History: Past Medical History  Diagnosis Date  . Paroxysmal atrial fibrillation     a. cardioversion 11/21/10 - Dr Radford Pax. b. 05/2013 - experiencing more SOB. 24-hr holter showed avg HR 49, max 82, min 38bpm. ETT showed baseline junctional bradycardia, HR incr only to 90bpm with drop in BP. Saw EP - amiodarone reduced and eventually d/c'd with improvement in dyspnea.  . Hypertension   . Warfarin anticoagulation   . CKD (chronic kidney disease), stage V     a. Followed by Dr. Jimmy Footman. b. IV fistula placed 04/17/14.  [name] Hypertrophic obstructive cardiomyopathy     a. Echo 10/2013 actually showed mild concentric LVH.  [name] Bladder cancer     a. 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  . Aortic valve stenosis     a. 10/2013 - mild to moderate stenosis by echo.  . Orthostatic hypotension   . Diastolic dysfunction   . Arthritis   . Self-catheterizes urinary bladder   . Junctional bradycardia   . NSVT (nonsustained ventricular tachycardia)     a. By holter 01/2014.    Medications:  Scheduled:  . [START ON 04/30/2014] calcitRIOL  0.25 mcg Oral Daily  . cholecalciferol  1,000 Units  Oral Daily  . sodium bicarbonate  1,300 mg Oral BID  . sodium chloride  3 mL Intravenous Q12H    Assessment: 73 yr old male was seen at MD office 12/29 for SOB. He has a history of persistant afib, HTN, and worsening kidney function. He underwent AV fistula placement on 12/18 and his coumadin was held for that and has barely returned to a therapeutic level of INR 2.1 today. He takes 2.5 mg of coumadin daily and has taken his dose today.   Goal of Therapy:  INR 2-3    Plan:  Pt's home dose of coumadin is 2.5 mg. Coumadin was held for placement on an AV fistula on 12/18 and his INR is just becoming therapeutic again. He has taken his dose today. Daily INR ordered.  Minta Balsam 04/29/2014,7:59 PM

## 2014-04-29 NOTE — ED Notes (Signed)
Patient transported to X-ray 

## 2014-04-29 NOTE — Progress Notes (Addendum)
Pt performs self catheterization at home through a LLQ ostomy to an internal colon-urine collection pouch that was created after the removal of his bladder due to CA. Pt explained he will catheterize himself using clean technique when he feels the pouch become full. Pt was given a sterile in and out urinary catheter kit. This RN explained to the pt that he will need to use sterile technique while in the hospital. Pt refused, claiming he only needs to use clean technique. This RN explained to pt, the hospital has different and potentially dangerous germs that could enter his ostomy giving him an infection. Pt verbalized he did not believe he would get an infection if he continued to do what he does at home. Education was reinforced. Pt would not allow RN to perform catheterization. Pt performed catheterization in the bathroom using clean technique, draining urine into a collection container. Pt cleaned used catheter with soap and water. This RN reinforced education again, threw away used catheter, and placed sterile kit in pt's room. Will continue to reinforce education.

## 2014-04-29 NOTE — Telephone Encounter (Signed)
S/w pt's wife [name] due to pt was not home. I explained the urgency per Melina Copa, PA pt needs to go to [facility] ED ASAP today due to Acute Renal Failure. Wife asked what his #'s were I stated BUN 69, Creatinine 5.1, K+ 4.6. Wife states she will call pt now. Wife states she knew pt was sick and she will tell him he needs to go to [facility] ED as soon as he can today. I again stressed the importance of this due to Acute Renal Failure per Melina Copa, PA. Wife thanked me for my call and said she will call me back once she reaches pt to tell me they are on their way to the ED. I said thank you for her help as well. Per Dayna did not need to call Trish, said let's let ER decided what will be the best way to treat pt at this time. I also verbalized understanding to my instructions given to me from Melina Copa, [place].

## 2014-04-29 NOTE — ED Notes (Signed)
Patient returned from X-ray 

## 2014-04-29 NOTE — ED Notes (Signed)
Pt reports being sob for days, hx of renal failure but has not started dialysis yet. Denies any swelling or recent cough. Airway intact at triage. ekg done.

## 2014-04-29 NOTE — Plan of Care (Signed)
Problem: Consults Goal: Tobacco Cessation referral if indicated Outcome: Not Applicable Date Met:  41/93/79 Pt is a former smoker  Problem: Phase I Progression Outcomes Goal: Anticoagulation Therapy per MD order Outcome: Progressing Home coumadin per pharmacy consult Goal: Heart rate or rhythm control medication Outcome: Completed/Met Date Met:  04/29/14 Pt on Cardizem gtt

## 2014-04-29 NOTE — ED Notes (Signed)
Cardiologist at bedside.  

## 2014-04-29 NOTE — ED Notes (Signed)
Dr. McAlhany at bedside.  

## 2014-04-29 NOTE — ED Provider Notes (Signed)
CSN: [id number]     Arrival date & time 04/29/14  1556 History   First MD Initiated Contact with Patient 04/29/14 1606     Chief Complaint  Patient presents with  . Shortness of Breath     Patient is a 73 y.o. male presenting with shortness of breath. The history is provided by the patient. No language interpreter was used.  Shortness of Breath  [name] presents for evaluation of progressive shortness of breath. He's had shortness of breath and dyspnea on exertion for the last 3 months. Symptoms are worse with light activity and he is no longer able to climb a flight of stairs without resting to catch his breath. He denies any chest pain, orthopnea, fevers. He has a nonproductive cough. He is producing urine. He has mild lower extremities edema, greater on the left lower extremity. He had an AV fistula placed a week ago. He saw the cardiology office yesterday and was started on diltiazem. He took a dose last night that he has noticed no difference in his symptoms. Symptoms are moderate, constant, worsening.  Past Medical History  Diagnosis Date  . Paroxysmal atrial fibrillation     a. cardioversion 11/21/10 - Dr Radford Pax. b. 05/2013 - experiencing more SOB. 24-hr holter showed avg HR 49, max 82, min 38bpm. ETT showed baseline junctional bradycardia, HR incr only to 90bpm with drop in BP. Saw EP - amiodarone reduced and eventually d/c'd with improvement in dyspnea.  . Hypertension   . Warfarin anticoagulation   . CKD (chronic kidney disease), stage V     a. Followed by Dr. Jimmy Footman. b. IV fistula placed 04/17/14.  [name] Hypertrophic obstructive cardiomyopathy     a. Echo 10/2013 actually showed mild concentric LVH.  [name] Bladder cancer     a. 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  . Aortic valve stenosis     a. 10/2013 - mild to moderate stenosis by echo.  . Orthostatic hypotension   . Diastolic dysfunction   . Arthritis   . Self-catheterizes urinary bladder   .  Junctional bradycardia   . NSVT (nonsustained ventricular tachycardia)     a. By holter 01/2014.   Past Surgical History  Procedure Laterality Date  . Cardioversion  11/21/2010  . Cystectomy    . Cholecystectomy    . Cataract extraction w/ intraocular lens  implant, bilateral    . Colonoscopy    . Av fistula placement Left 04/17/2014    Procedure: ARTERIOVENOUS (AV) FISTULA CREATION- LEFT UPPER ARM ;  Surgeon: Mal [name], MD;  Location: [facility] OR;  Service: Vascular;  Laterality: Left;   Family History  Problem Relation Age of Onset  . CVA Mother   . Heart attack Father   . Heart disease Father   . Arrhythmia Father   . Alzheimer's disease Sister   . Multiple sclerosis Brother   . Alcohol abuse Father    History  Substance Use Topics  . Smoking status: Former Smoker -- 2.00 packs/day for 33 years    Types: Cigarettes    Quit date: 05/02/1983  . Smokeless tobacco: Never Used  . Alcohol Use: No    Review of Systems  Respiratory: Positive for shortness of breath.   All other systems reviewed and are negative.     Allergies  Amiodarone and Oxycodone hcl  Home Medications   Prior to Admission medications   Medication Sig Start Date End Date Taking? Authorizing Provider  B Complex-C (SUPER B COMPLEX  PO) Take 1 capsule by mouth daily.     Historical Provider, MD  calcitRIOL (ROCALTROL) 0.25 MCG capsule Take 0.25-0.5 mcg by mouth daily. Alternate 1 or 2 capsules daily    Historical Provider, MD  cholecalciferol (VITAMIN D) 1000 UNITS tablet Take 1,000 Units by mouth daily.    Historical Provider, MD  CINNAMON PO Take 2,000 mg by mouth daily.    Historical Provider, MD  diltiazem (CARDIZEM CD) 180 MG 24 hr capsule Take 1 capsule (180 mg total) by mouth daily. 04/28/14   Dayna N Dunn, PA-C  Misc Natural Products (OSTEO BI-FLEX ADV TRIPLE ST PO) Take 1 tablet by mouth daily.     Historical Provider, MD  Multiple Vitamins-Minerals (CENTRUM SILVER ULTRA MENS PO) Take 1 tablet  by mouth daily. Take daily    Historical Provider, MD  sodium bicarbonate 650 MG tablet Take 1,300 mg by mouth 2 (two) times daily.     Historical Provider, MD  warfarin (COUMADIN) 5 MG tablet Take as directed by coumadin clinic Patient taking differently: Take 2.5 mg by mouth daily. Take as directed by coumadin clinic 12/04/13   Sueanne Margarita, MD   BP 132/102 mmHg  Pulse 111  Temp(Src) 97.4 F (36.3 C) (Oral)  Resp 18  SpO2 93% Physical Exam  Constitutional: He is oriented to person, place, and time. He appears well-developed and well-nourished.  HENT:  Head: Normocephalic and atraumatic.  Cardiovascular:  Irregular rhythm, SEM  Pulmonary/Chest: Effort normal. No respiratory distress.  Occasional rhonchi  Abdominal: Soft. There is no tenderness. There is no rebound and no guarding.  Musculoskeletal:  1+ pitting edema in BLE. Surgical wound in LUE c/d/i  Neurological: He is alert and oriented to person, place, and time.  Skin: Skin is warm and dry.  Psychiatric: He has a normal mood and affect. His behavior is normal.  Nursing note and vitals reviewed.   ED Course  Procedures (including critical care time) Labs Review Labs Reviewed  CBC - Abnormal; Notable for the following:    RBC 3.76 (*)    Hemoglobin 11.6 (*)    HCT 35.6 (*)    RDW 16.1 (*)    All other components within normal limits  BASIC METABOLIC PANEL - Abnormal; Notable for the following:    CO2 17 (*)    Glucose, Bld 117 (*)    BUN 65 (*)    Creatinine, Ser 5.24 (*)    GFR calc non Af Amer 10 (*)    GFR calc Af Amer 11 (*)    All other components within normal limits  BRAIN NATRIURETIC PEPTIDE - Abnormal; Notable for the following:    B Natriuretic Peptide 1281.2 (*)    All other components within normal limits  PROTIME-INR - Abnormal; Notable for the following:    Prothrombin Time 24.3 (*)    INR 2.16 (*)    All other components within normal limits  I-STAT TROPOININ, ED    Imaging Review Dg Chest  2 View  04/29/2014   CLINICAL DATA:  Chest pain.  Shortness of breath for 3 months.  EXAM: CHEST  2 VIEW  COMPARISON:  07/17/2013.  FINDINGS: Trachea is midline. Heart size stable. Mild diffuse interstitial prominence and indistinctness. Small bilateral effusions, left greater than right, with compressive atelectasis at the left lung base.  IMPRESSION: Favor pulmonary edema with small bilateral effusions, left greater than right.   Electronically Signed   By: Lorin Picket M.D.   On: 04/29/2014 16:51  EKG Interpretation   Date/Time:  Wednesday April 29 2014 16:01:35 EST Ventricular Rate:  124 PR Interval:    QRS Duration: 90 QT Interval:  330 QTC Calculation: 474 R Axis:   108 Text Interpretation:  Atrial fibrillation with rapid ventricular response  Rightward axis RSR' or QR pattern in V1 suggests right ventricular  conduction delay Abnormal QRS-T angle, consider primary T wave abnormality  Abnormal ECG Confirmed by Hazle Coca [id number]) on 04/29/2014 4:18:12 PM      MDM   Final diagnoses:  Acute on chronic renal failure  Atrial fibrillation with RVR    Patient with history of atrial fibrillation and chronic kidney disease here for evaluation of progressive shortness of breath. BMP demonstrates progressive renal disease. Chest x-ray with pulmonary edema. Patient is in A. fib with RVR in the emergency department, given diltiazem for rate control. Discussed with Dr.McAlhany with Cardiology, who evaluated pt in ED - recommends dilitazem and admission to medicine for co-management of renal disease and cardiac disease.  D/w Dr. Eliseo Squires with medicine, who agrees to admit.  Nephrology paged.     Quintella Reichert, MD 04/30/14 [phone]

## 2014-04-29 NOTE — Consult Note (Signed)
Patient ID: [name] MRN: [id number] DOB/AGE: Sep 08, 1940 73 y.o.  Admit date: 04/29/2014 Primary Cardiologist: Radford Pax Primary Electrophysiologist: Lovena Le Reason for Consultation: Atrial fib with RVR  HPI: 73 yo male with history of persistent atrial fibrillation, junctional bradycardia, HTN, reported prior HOCM (although last echo 10/2013 - mild LVH), orthostatic hypotension, HTN, mild-mod AS (by echo 10/2013) who presents to the ED today with c/o SOB. He was seen in our office yesterday by Rae Roam, PA-C and was found to be in atrial fibrillation with RVR. His only complaint was SOB. He was offered admission but refused yesterday. He was noted to have worsening of renal function yesterday.   He has chronic persistent atrial fibrillation and has been followed in our office by Dr. Lovena Le. He has a remote history of DCCV and was previously on amiodarone. He had a normal nuc in 2012. He was seen 05/2013 with increased DOE. A 24 Hour Holter monitor showed an average HR of 49bpm with max HR 82bpm and min HR 38bpm. He underwent ETT to assess chronotropic response to exercise which showed baseline junctional bradycardia and HR only increased to 90bpm with exercise with a drop in BP. He was seen by Dr. Lovena Le. His amio was decreased to 200mg  daily and ultimately stopped due to severely reduced DLCO. He had significant improvement in SOB after cessation. More recently he saw Dr. Lovena Le 01/2014 for f/u of his AF as well as an episode of NSVT seen on another event monitor. His AF was rate controlled. Given his renal dysfunction Dr. Lovena Le recommended rate control only. He felt that his comorbidities including near end-stage renal disease pending dialysis made him a very poor candidate for ICD implantation. The patient says he is not yet at the point where he requires dialysis and follows with Dr. Jimmy Footman regularly, last seen 1.5 months ago. Creatinine in March 2015 was 4.5 and yesterday was 5.1. He  underwent AV fistula placement 04/17/14. HR documented as 94 at that time. He was prescribed oxycodone as pain management afterwards but developed itching. He tried to continue the medicine and manage the itching with hydrocortisone and Aveeno but eventually had to discontinue the oxycodone. The itching resolved. He did not have any swelling, rash or sensation of throat closure. Over the last several days he has noticed increased dyspnea both at rest and with exertion. When he lays down at night he feels his heart pounding. He has not had any chest pain, change in urination, LEE, bleeding, pre-syncope or syncope. In clinic he was found to be in AF RVR with HR 120's.  He is now presenting to the ED for admission given the above issues. His only complaint today is    Past Medical History  Diagnosis Date  . Paroxysmal atrial fibrillation     a. cardioversion 11/21/10 - Dr Radford Pax. b. 05/2013 - experiencing more SOB. 24-hr holter showed avg HR 49, max 82, min 38bpm. ETT showed baseline junctional bradycardia, HR incr only to 90bpm with drop in BP. Saw EP - amiodarone reduced and eventually d/c'd with improvement in dyspnea.  . Hypertension   . Warfarin anticoagulation   . CKD (chronic kidney disease), stage V     a. Followed by Dr. Jimmy Footman. b. IV fistula placed 04/17/14.  [name] Hypertrophic obstructive cardiomyopathy     a. Echo 10/2013 actually showed mild concentric LVH.  [name] Bladder cancer     a. 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  . Aortic valve  stenosis     a. 10/2013 - mild to moderate stenosis by echo.  . Orthostatic hypotension   . Diastolic dysfunction   . Arthritis   . Self-catheterizes urinary bladder   . Junctional bradycardia   . NSVT (nonsustained ventricular tachycardia)     a. By holter 01/2014.    Family History  Problem Relation Age of Onset  . CVA Mother   . Heart attack Father   . Heart disease Father   . Arrhythmia Father   . Alzheimer's disease  Sister   . Multiple sclerosis Brother   . Alcohol abuse Father     History   Social History  . Marital Status: Married    Spouse Name: N/A    Number of Children: N/A  . Years of Education: N/A   Occupational History  . Not on file.   Social History Main Topics  . Smoking status: Former Smoker -- 2.00 packs/day for 33 years    Types: Cigarettes    Quit date: 05/02/1983  . Smokeless tobacco: Never Used  . Alcohol Use: No  . Drug Use: No  . Sexual Activity: Not on file   Other Topics Concern  . Not on file   Social History Narrative    Past Surgical History  Procedure Laterality Date  . Cardioversion  11/21/2010  . Cystectomy    . Cholecystectomy    . Cataract extraction w/ intraocular lens  implant, bilateral    . Colonoscopy    . Av fistula placement Left 04/17/2014    Procedure: ARTERIOVENOUS (AV) FISTULA CREATION- LEFT UPPER ARM ;  Surgeon: Mal [name], MD;  Location: [place];  Service: Vascular;  Laterality: Left;    Allergies  Allergen Reactions  . Amiodarone     Severely reduced DLCO  . Oxycodone Hcl Rash and Other (See Comments)    Rash and itching    Prior to Admission medications   Medication Sig Start Date End Date Taking? Authorizing Provider  B Complex-C (SUPER B COMPLEX PO) Take 1 capsule by mouth daily.     Historical Provider, MD  calcitRIOL (ROCALTROL) 0.25 MCG capsule Take 0.25-0.5 mcg by mouth daily. Alternate 1 or 2 capsules daily    Historical Provider, MD  cholecalciferol (VITAMIN D) 1000 UNITS tablet Take 1,000 Units by mouth daily.    Historical Provider, MD  CINNAMON PO Take 2,000 mg by mouth daily.    Historical Provider, MD  diltiazem (CARDIZEM CD) 180 MG 24 hr capsule Take 1 capsule (180 mg total) by mouth daily. 04/28/14   Dayna N Dunn, PA-C  Misc Natural Products (OSTEO BI-FLEX ADV TRIPLE ST PO) Take 1 tablet by mouth daily.     Historical Provider, MD  Multiple Vitamins-Minerals (CENTRUM SILVER ULTRA MENS PO) Take 1 tablet by mouth  daily. Take daily    Historical Provider, MD  sodium bicarbonate 650 MG tablet Take 1,300 mg by mouth 2 (two) times daily.     Historical Provider, MD  warfarin (COUMADIN) 5 MG tablet Take as directed by coumadin clinic Patient taking differently: Take 2.5 mg by mouth daily. Take as directed by coumadin clinic 12/04/13   Sueanne Margarita, MD    Review of systems complete and found to be negative unless listed above    Physical Exam: Blood pressure 144/110, pulse 54, temperature 97.4 F (36.3 C), temperature source Oral, resp. rate 12, SpO2 95 %.  General: Well developed, well nourished, NAD  HEENT: OP clear, mucus membranes moist  SKIN:  warm, dry. No rashes.  Neuro: No focal deficits  Musculoskeletal: Muscle strength 5/5 all ext  Psychiatric: Mood and affect normal  Neck: No JVD, no carotid bruits, no thyromegaly, no lymphadenopathy.  Lungs:Clear bilaterally with mild basilar crackles left, no wheezes, rhonci.  Cardiovascular: Irregular irregular. No murmurs, gallops or rubs.  Abdomen:Soft. Bowel sounds present. Non-tender.  Extremities: 1+ bilateral lower extremity edema.   Labs:   Lab Results  Component Value Date   WBC 9.7 04/29/2014   HGB 11.6* 04/29/2014   HCT 35.6* 04/29/2014   MCV 94.7 04/29/2014   PLT 279 04/29/2014     Recent Labs Lab 04/29/14 1616  NA 141  K 4.5  CL 112  CO2 17*  BUN 65*  CREATININE 5.24*  CALCIUM 9.4  GLUCOSE 117*    Radiology: Chest xray: official reading is pending. There appears to be blunting of the left costophrenic angle and diffuse mild edema.   EKG: Atrial fib, rate 124 bpm.   ASSESSMENT AND PLAN:  1. Atrial fibrillation: Pt currently with atrial fibrillation with RVR. Would stop po Cardizem and start Diltiazem drip at 10mg /hour with 10 mg bolus.. Titrate as needed for rate control. He is anti-coagulated with coumadin but this was held for AV fistula placement and his INR has not been therapeutic since. INR was 1.7 on 04/27/14 and  is now 2.1. I will ask Dr. Lovena Le to see him tomorrow. His options for medical therapy are limited with his renal failure. He has not tolerated amiodarone in the past.   2. Chronic kidney disease, stage 4 with recent worsening: I will ask that he be admitted to the IM service. Nephrology will also need to be consulted.   3. Moderate aortic stenosis: Will follow  4. Acute on chronic diastolic CHF: Mild pulmonary edema. Minimal dyspnea. Will hold on Lasix today given his worsened renal function. Will ask that Nephrology help guide diuresis.  Signed: Lauree Chandler, MD 04/29/2014, 5:19 PM

## 2014-04-29 NOTE — H&P (Addendum)
[facility] History and Physical  [name] AUQ:[id number] DOB: [birth year] DOA: 04/29/2014  Referring physician: er PCP: Pcp Not In System   Chief Complaint: sent from cardiology office  HPI: [name] is a 73 y.o. male with chronic a fib with h/o DCCV and being on amio.  Patient under went AV fistula placement on 12/18. He was seen in the cardiology office on 12/29.  He was seen for SOB that had developed over the last few days- both at rest and with exertion.  In clinic he was found with A HR of 120s (fib).  Cardiology advised him to go to the hospital.  Patient declined.  His amiodarone was changed to diltiazem at that time.   His Cr was found to be elevated as well during the visit.  No CP, No fever, no chills.  Patient states he drove over 200 miles today to take care of business.    In the ER, he was seen by cardiology who started him on cardizem gtt and asked hospitalist to admit as patient has CKD.   ER physician called nephrology to help guide diuresis    Review of Systems:  All systems reviewed, negative unless stated above   Past Medical History  Diagnosis Date  . Paroxysmal atrial fibrillation     a. cardioversion 11/21/10 - Dr Radford Pax. b. 05/2013 - experiencing more SOB. 24-hr holter showed avg HR 49, max 82, min 38bpm. ETT showed baseline junctional bradycardia, HR incr only to 90bpm with drop in BP. Saw EP - amiodarone reduced and eventually d/c'd with improvement in dyspnea.  . Hypertension   . Warfarin anticoagulation   . CKD (chronic kidney disease), stage V     a. Followed by Dr. Jimmy Footman. b. IV fistula placed 04/17/14.  [name] Hypertrophic obstructive cardiomyopathy     a. Echo 10/2013 actually showed mild concentric LVH.  [name] Bladder cancer     a. 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  . Aortic valve stenosis     a. 10/2013 - mild to moderate stenosis by echo.  . Orthostatic hypotension   . Diastolic dysfunction   . Arthritis     . Self-catheterizes urinary bladder   . Junctional bradycardia   . NSVT (nonsustained ventricular tachycardia)     a. By holter 01/2014.   Past Surgical History  Procedure Laterality Date  . Cardioversion  11/21/2010  . Cystectomy    . Cholecystectomy    . Cataract extraction w/ intraocular lens  implant, bilateral    . Colonoscopy    . Av fistula placement Left 04/17/2014    Procedure: ARTERIOVENOUS (AV) FISTULA CREATION- LEFT UPPER ARM ;  Surgeon: Mal [name], MD;  Location: [facility];  Service: Vascular;  Laterality: Left;   Social History:  reports that he quit smoking about 31 years ago. His smoking use included Cigarettes. He has a 66 pack-year smoking history. He has never used smokeless tobacco. He reports that he does not drink alcohol or use illicit drugs.  Allergies  Allergen Reactions  . Amiodarone     Severely reduced DLCO  . Oxycodone Hcl Rash and Other (See Comments)    Rash and itching    Family History  Problem Relation Age of Onset  . CVA Mother   . Heart attack Father   . Heart disease Father   . Arrhythmia Father   . Alzheimer's disease Sister   . Multiple sclerosis Brother   . Alcohol abuse Father  Prior to Admission medications   Medication Sig Start Date End Date Taking? Authorizing Provider  B Complex-C (SUPER B COMPLEX PO) Take 1 capsule by mouth daily.     Historical Provider, MD  calcitRIOL (ROCALTROL) 0.25 MCG capsule Take 0.25-0.5 mcg by mouth daily. Alternate 1 or 2 capsules daily    Historical Provider, MD  cholecalciferol (VITAMIN D) 1000 UNITS tablet Take 1,000 Units by mouth daily.    Historical Provider, MD  CINNAMON PO Take 2,000 mg by mouth daily.    Historical Provider, MD  diltiazem (CARDIZEM CD) 180 MG 24 hr capsule Take 1 capsule (180 mg total) by mouth daily. 04/28/14   Dayna N Dunn, PA-C  Misc Natural Products (OSTEO BI-FLEX ADV TRIPLE ST PO) Take 1 tablet by mouth daily.     Historical Provider, MD  Multiple Vitamins-Minerals  (CENTRUM SILVER ULTRA MENS PO) Take 1 tablet by mouth daily. Take daily    Historical Provider, MD  sodium bicarbonate 650 MG tablet Take 1,300 mg by mouth 2 (two) times daily.     Historical Provider, MD  warfarin (COUMADIN) 5 MG tablet Take as directed by coumadin clinic Patient taking differently: Take 2.5 mg by mouth daily. Take as directed by coumadin clinic 12/04/13   Sueanne Margarita, MD   Physical Exam: Filed Vitals:   04/29/14 1603 04/29/14 1655  BP: 132/102 144/110  Pulse: 111 54  Temp: 97.4 F (36.3 C)   TempSrc: Oral   Resp: 18 12  SpO2: 93% 95%    Wt Readings from Last 3 Encounters:  04/28/14 79.833 kg (176 lb)  04/17/14 78.472 kg (173 lb)  03/30/14 78.472 kg (173 lb)    General:  Appears calm and comfortable, NAD Eyes: PERRL, normal lids, irises & conjunctiva ENT: grossly normal hearing, lips & tongue Neck: no LAD, masses or thyromegaly Cardiovascular: irr, + murmur, + edema LE Respiratory: CTA bilaterally, no w/r/r. Normal respiratory effort. Abdomen: soft, ntnd Skin: no rash or induration seen on limited exam Musculoskeletal: grossly normal tone BUE/BLE Psychiatric: grossly normal mood and affect, speech fluent and appropriate Neurologic: grossly non-focal.          Labs on Admission:  Basic Metabolic Panel:  Recent Labs Lab 04/28/14 1700 04/29/14 1616  NA 139 141  K 4.6 4.5  CL 109 112  CO2 17* 17*  GLUCOSE 113* 117*  BUN 69* 65*  CREATININE 5.1* 5.24*  CALCIUM 9.3 9.4   Liver Function Tests: No results for input(s): AST, ALT, ALKPHOS, BILITOT, PROT, ALBUMIN in the last 168 hours. No results for input(s): LIPASE, AMYLASE in the last 168 hours. No results for input(s): AMMONIA in the last 168 hours. CBC:  Recent Labs Lab 04/28/14 1700 04/29/14 1616  WBC 9.8 9.7  HGB 11.7* 11.6*  HCT 35.7* 35.6*  MCV 96.6 94.7  PLT 275.0 279   Cardiac Enzymes: No results for input(s): CKTOTAL, CKMB, CKMBINDEX, TROPONINI in the last 168 hours.  BNP  (last 3 results)  Recent Labs  07/22/13 1440  PROBNP 580.0*   CBG: No results for input(s): GLUCAP in the last 168 hours.  Radiological Exams on Admission: Dg Chest 2 View  04/29/2014   CLINICAL DATA:  Chest pain.  Shortness of breath for 3 months.  EXAM: CHEST  2 VIEW  COMPARISON:  07/17/2013.  FINDINGS: Trachea is midline. Heart size stable. Mild diffuse interstitial prominence and indistinctness. Small bilateral effusions, left greater than right, with compressive atelectasis at the left lung base.  IMPRESSION: Favor pulmonary  edema with small bilateral effusions, left greater than right.   Electronically Signed   By: Lorin Picket M.D.   On: 04/29/2014 16:51    EKG: Independently reviewed. A fib with RVR  Assessment/Plan Active Problems:   Aortic stenosis, moderate   Dyspnea   Chronic kidney disease, stage V   Atrial fibrillation with RVR   A fib with RVR- cardizem gtt- place in SDU for now; cycle CE, warfarin per pharmacy -appreciate cardiology consult  AS mod  CKD V- not yet on dialysis but had AV fistula placed 12/18 -renal consult  SOB- no distress currently, holding lasix and deferring diuresis to nephrology   Cardiology seen in ER Nephrology (ER called but no response per ER doc)   Code Status: full DVT Prophylaxis: Family Communication: patient Disposition Plan:   Time spent: 2 min  Eulogio Bear [facility] Pager [phone]

## 2014-04-30 ENCOUNTER — Ambulatory Visit: Payer: MEDICARE | Admitting: Physician Assistant

## 2014-04-30 DIAGNOSIS — R0600 Dyspnea, unspecified: Secondary | ICD-10-CM

## 2014-04-30 DIAGNOSIS — J810 Acute pulmonary edema: Secondary | ICD-10-CM | POA: Insufficient documentation

## 2014-04-30 DIAGNOSIS — J90 Pleural effusion, not elsewhere classified: Secondary | ICD-10-CM | POA: Insufficient documentation

## 2014-04-30 DIAGNOSIS — I350 Nonrheumatic aortic (valve) stenosis: Secondary | ICD-10-CM

## 2014-04-30 LAB — CBC
HCT: 31.7 % — ABNORMAL LOW (ref 39.0–52.0)
Hemoglobin: 10.3 g/dL — ABNORMAL LOW (ref 13.0–17.0)
MCH: 31.3 pg (ref 26.0–34.0)
MCHC: 32.5 g/dL (ref 30.0–36.0)
MCV: 96.4 fL (ref 78.0–100.0)
Platelets: 239 10*3/uL (ref 150–400)
RBC: 3.29 MIL/uL — AB (ref 4.22–5.81)
RDW: 16.1 % — AB (ref 11.5–15.5)
WBC: 9.5 10*3/uL (ref 4.0–10.5)

## 2014-04-30 LAB — PROTIME-INR
INR: 2.32 — ABNORMAL HIGH (ref 0.00–1.49)
PROTHROMBIN TIME: 25.6 s — AB (ref 11.6–15.2)

## 2014-04-30 LAB — BASIC METABOLIC PANEL
Anion gap: 13 (ref 5–15)
BUN: 63 mg/dL — ABNORMAL HIGH (ref 6–23)
CALCIUM: 8.9 mg/dL (ref 8.4–10.5)
CHLORIDE: 115 meq/L — AB (ref 96–112)
CO2: 13 mmol/L — ABNORMAL LOW (ref 19–32)
CREATININE: 4.81 mg/dL — AB (ref 0.50–1.35)
GFR calc non Af Amer: 11 mL/min — ABNORMAL LOW (ref >90)
GFR, EST AFRICAN AMERICAN: 12 mL/min — AB (ref >90)
Glucose, Bld: 88 mg/dL (ref 70–99)
Potassium: 4.3 mmol/L (ref 3.5–5.1)
SODIUM: 141 mmol/L (ref 135–145)

## 2014-04-30 LAB — TROPONIN I
Troponin I: 0.03 ng/mL (ref <0.031)
Troponin I: 0.04 ng/mL — ABNORMAL HIGH (ref <0.031)

## 2014-04-30 MED ORDER — FUROSEMIDE 10 MG/ML IJ SOLN
80.0000 mg | Freq: Two times a day (BID) | INTRAMUSCULAR | Status: DC
  Administered 2014-04-30 – 2014-05-01 (×3): 80 mg via INTRAVENOUS
  Filled 2014-04-30 (×4): qty 8

## 2014-04-30 MED ORDER — METOPROLOL SUCCINATE ER 25 MG PO TB24
25.0000 mg | ORAL_TABLET | Freq: Every day | ORAL | Status: DC
  Administered 2014-04-30: 25 mg via ORAL
  Filled 2014-04-30 (×2): qty 1

## 2014-04-30 MED ORDER — DILTIAZEM HCL 60 MG PO TABS
60.0000 mg | ORAL_TABLET | Freq: Three times a day (TID) | ORAL | Status: DC
  Administered 2014-04-30 – 2014-05-01 (×3): 60 mg via ORAL
  Filled 2014-04-30 (×6): qty 1

## 2014-04-30 MED ORDER — WARFARIN - PHARMACIST DOSING INPATIENT
Freq: Every day | Status: DC
  Administered 2014-05-01: 18:00:00

## 2014-04-30 MED ORDER — WARFARIN SODIUM 2.5 MG PO TABS
2.5000 mg | ORAL_TABLET | Freq: Once | ORAL | Status: AC
  Administered 2014-04-30: 2.5 mg via ORAL
  Filled 2014-04-30: qty 1

## 2014-04-30 MED ORDER — SODIUM BICARBONATE 650 MG PO TABS
650.0000 mg | ORAL_TABLET | Freq: Two times a day (BID) | ORAL | Status: DC
  Filled 2014-04-30 (×2): qty 1

## 2014-04-30 NOTE — Progress Notes (Signed)
ANTICOAGULATION CONSULT NOTE - Follow Up Consult  Pharmacy Consult for Coumadin Indication: atrial fibrillation  Allergies  Allergen Reactions  . Amiodarone     Severely reduced DLCO  . Oxycodone Hcl Rash and Other (See Comments)    Rash and itching    Patient Measurements: Height: 5\' 8"  (172.7 cm) Weight: 179 lb 0.2 oz (81.2 kg) IBW/kg (Calculated) : 68.4  Vital Signs: Temp: 98 F (36.7 C) (12/31 0700) Temp Source: Oral (12/31 0700) BP: 134/81 mmHg (12/31 1121) Pulse Rate: 96 (12/31 1121)  Labs:  Recent Labs  04/27/14 1420  04/28/14 1700 04/29/14 1616 04/29/14 1640 04/29/14 2020 04/30/14 0026 04/30/14 0632  HGB  --   < > 11.7* 11.6*  --   --   --  10.3*  HCT  --   --  35.7* 35.6*  --   --   --  31.7*  PLT  --   --  275.0 279  --   --   --  239  LABPROT  --   --   --   --  24.3*  --   --  25.6*  INR 1.7  --   --   --  2.16*  --   --  2.32*  CREATININE  --   --  5.1* 5.24*  --   --   --  4.81*  TROPONINI  --   --   --   --   --  0.04* 0.03 0.04*  < > = values in this interval not displayed.  Estimated Creatinine Clearance: 13 mL/min (by C-G formula based on Cr of 4.81).  Assessment: 73yom continues on coumadin for afib. Coumadin was held for AV fistula placement on 12/18 but has since been resumed. INR is therapeutic. CBC stable. No bleeding reported.  Home dose 2.5mg  daily  Goal of Therapy:  INR 2-3 Monitor platelets by anticoagulation protocol: Yes   Plan:  1) Coumadin 2.5mg  x 1 2) INR in AM  Deboraha Sprang 04/30/2014,11:24 AM

## 2014-04-30 NOTE — Consult Note (Signed)
ELECTROPHYSIOLOGY CONSULT NOTE    Patient ID: [name] MRN: [id number], DOB/AGE: 07-09-1940 73 y.o.  Admit date: 04/29/2014 Date of Consult: 04/30/2014  Primary Physician: Pcp Not In System Primary Cardiologist: Turner Electrophysiologist: Lovena Le  Reason for Consultation: AF with RVR  HPI:  [name] is a 73 y.o. male with a past medical history significant for atrial fibrillation, hypertension, CKD, bladder cancer (s/p resection), and reported HCM (not noted on echo 10/2013).   He was first diagnosed with AF in 2008.  He underwent Sotalol loading and cardioversion at that time.  He failed to maintain SR with Sotalol and was placed on Amiodarone.  This was discontinued due to shortness of breath and severely reduced DLCO which improved after discontinuation in April of 2015.  Rate control for AF was planned strategy. He wore a holter monitor 01/2014 which demonstrated predominately controlled ventricular response with some nocturnal bradycardia and occasional periods of RVR as well as run of NSVT.  The patient was asymptomatic.   He is followed by Dr Deterding and underwent AV fistula placement 04-17-14.  His Coumadin was interrupted for this procedure.  He has not yet required dialysis.  He was prescribed Oxycodone post fistula placement but developed an allergic reaction.  For several days prior to admission, he has had increased shortness of breath and tachy palpitations.  He was seen in the office on 04-28-14 and hospital admission was recommended.  He initially declined, but after lab work revealed worsening renal failure he agreed to admission.   Nephrology has been consulted by primary team to help guide diuresis.   Last echo 10/2013 demonstrated EF 64-40%, grade 2 diastolic dysfunction, no RWMA, mild to moderate AS, trivial MS, LA 50.   He is frustrated this morning and has poor insight into his medical problems.  He states that he has had a dry hacking cough for the  last 2 weeks.  He denies chest pain.  He states that his shortness of breath had worsened in the days preceding admission.  He denies fevers or chills.  No nausea or vomiting.    EP has been asked to evaluate for treatment options for AF.   Past Medical History  Diagnosis Date  . Paroxysmal atrial fibrillation     a. cardioversion 11/21/10 - Dr Radford Pax. b. 05/2013 - experiencing more SOB. 24-hr holter showed avg HR 49, max 82, min 38bpm. ETT showed baseline junctional bradycardia, HR incr only to 90bpm with drop in BP. Saw EP - amiodarone reduced and eventually d/c'd with improvement in dyspnea.  . Hypertension   . Warfarin anticoagulation   . CKD (chronic kidney disease), stage V     a. Followed by Dr. Jimmy Footman. b. IV fistula placed 04/17/14.  [name] Hypertrophic obstructive cardiomyopathy     a. Echo 10/2013 actually showed mild concentric LVH.  [name] Bladder cancer     a. 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  . Aortic valve stenosis     a. 10/2013 - mild to moderate stenosis by echo.  . Orthostatic hypotension   . Diastolic dysfunction   . Arthritis   . Self-catheterizes urinary bladder   . Junctional bradycardia   . NSVT (nonsustained ventricular tachycardia)     a. By holter 01/2014.     Surgical History:  Past Surgical History  Procedure Laterality Date  . Cardioversion  11/21/2010  . Cystectomy    . Cholecystectomy    . Cataract extraction w/ intraocular lens  implant,  bilateral    . Colonoscopy    . Av fistula placement Left 04/17/2014    Procedure: ARTERIOVENOUS (AV) FISTULA CREATION- LEFT UPPER ARM ;  Surgeon: Mal [name], MD;  Location: [facility];  Service: Vascular;  Laterality: Left;     Prescriptions prior to admission  Medication Sig Dispense Refill Last Dose  . B Complex-C (SUPER B COMPLEX PO) Take 1 capsule by mouth daily.    04/28/2014 at Unknown time  . calcitRIOL (ROCALTROL) 0.25 MCG capsule Take 0.25 mcg by mouth daily. Alternate 1 or 2 capsules  daily   04/29/2014 at Unknown time  . cholecalciferol (VITAMIN D) 1000 UNITS tablet Take 1,000 Units by mouth daily.   04/28/2014 at Unknown time  . Cinnamon 500 MG capsule Take 500 mg by mouth daily.   04/28/2014 at Unknown time  . diltiazem (CARDIZEM CD) 180 MG 24 hr capsule Take 1 capsule (180 mg total) by mouth daily. 30 capsule 5 04/28/2014 at Unknown time  . glucosamine-chondroitin 500-400 MG tablet Take 1 tablet by mouth daily.   04/28/2014 at Unknown time  . Multiple Vitamins-Minerals (CENTRUM SILVER ULTRA MENS PO) Take 1 tablet by mouth daily. Take daily   04/28/2014 at Unknown time  . sodium bicarbonate 650 MG tablet Take 1,300 mg by mouth 2 (two) times daily.    04/29/2014 at Unknown time  . warfarin (COUMADIN) 5 MG tablet Take as directed by coumadin clinic (Patient taking differently: Take 2.5 mg by mouth daily. Take as directed by coumadin clinic) 90 tablet 1 04/29/2014 at Unknown time  . CINNAMON PO Take 2,000 mg by mouth daily.   Taking  . Misc Natural Products (OSTEO BI-FLEX ADV TRIPLE ST PO) Take 1 tablet by mouth daily.    Taking    Inpatient Medications:  . calcitRIOL  0.25 mcg Oral Daily  . cholecalciferol  1,000 Units Oral Daily  . sodium bicarbonate  1,300 mg Oral BID  . sodium chloride  3 mL Intravenous Q12H   . diltiazem (CARDIZEM) infusion 5 mg/hr (04/30/14 0300)      Allergies:  Allergies  Allergen Reactions  . Amiodarone     Severely reduced DLCO  . Oxycodone Hcl Rash and Other (See Comments)    Rash and itching    History   Social History  . Marital Status: Married    Spouse Name: N/A    Number of Children: N/A  . Years of Education: N/A   Occupational History  . Not on file.   Social History Main Topics  . Smoking status: Former Smoker -- 2.00 packs/day for 33 years    Types: Cigarettes    Quit date: 05/02/1983  . Smokeless tobacco: Never Used  . Alcohol Use: No  . Drug Use: No  . Sexual Activity: Not on file   Other Topics Concern  .  Not on file   Social History Narrative     Family History  Problem Relation Age of Onset  . CVA Mother   . Heart attack Father   . Heart disease Father   . Arrhythmia Father   . Alzheimer's disease Sister   . Multiple sclerosis Brother   . Alcohol abuse Father     BP 132/64 mmHg  Pulse 70  Temp(Src) 98 F (36.7 C) (Oral)  Resp 16  Ht 5\' 8"  (1.727 m)  Wt 179 lb 0.2 oz (81.2 kg)  BMI 27.23 kg/m2  SpO2 95%  Physical Exam; Well appearing 73 yo man, NAD HEENT: Unremarkable,[place], AT  Neck:  8 cm JVD, no thyromegally Back:  No CVA tenderness Lungs:  Clear with no wheezes, rales, or rhonchi HEART:  IRegular rate rhythm, no murmurs, no rubs, no clicks Abd:  soft, positive bowel sounds, no organomegally, no rebound, no guarding Ext:  2 plus pulses, no edema, no cyanosis, no clubbing Skin:  No rashes no nodules Neuro:  CN II through XII intact, motor grossly intact   Labs:   Lab Results  Component Value Date   WBC 9.5 04/30/2014   HGB 10.3* 04/30/2014   HCT 31.7* 04/30/2014   MCV 96.4 04/30/2014   PLT 239 04/30/2014    Recent Labs Lab 04/29/14 1616  NA 141  K 4.5  CL 112  CO2 17*  BUN 65*  CREATININE 5.24*  CALCIUM 9.4  GLUCOSE 117*   Lab Results  Component Value Date   TROPONINI 0.04* 04/30/2014     Radiology/Studies: Dg Chest 2 View 04/29/2014   CLINICAL DATA:  Chest pain.  Shortness of breath for 3 months.  EXAM: CHEST  2 VIEW  COMPARISON:  07/17/2013.  FINDINGS: Trachea is midline. Heart size stable. Mild diffuse interstitial prominence and indistinctness. Small bilateral effusions, left greater than right, with compressive atelectasis at the left lung base.  IMPRESSION: Favor pulmonary edema with small bilateral effusions, left greater than right.   Electronically Signed   By: Lorin Picket M.D.   On: 04/29/2014 16:51    EKG:AF rate 126, incomplete RBBB, non specific ST-T changes  TELEMETRY: AF, ventricular rates predominately 70-100's, up to 140's with  activity  A/P 1. Atrial fib with an rvr 2. 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 HTN 4. Bladder CA Rec: there is no way to maintain NSR. He will continue a strategy of rate control. Continue Calcium channel blocker and will add a low dose of metoprolol. Volume management will be a problem. Agree with asking our nephrologist for advice. I suspect HD will be needed sooner than later.  Mikle Bosworth.D.

## 2014-04-30 NOTE — Progress Notes (Signed)
[facility] TEAM 1 - Stepdown/ICU TEAM Progress Note  [name] UXN:[id number] DOB: October 08, 1940 DOA: 04/29/2014 PCP: Pcp Not In System  Admit HPI / Brief Narrative: [name] is a 73 y.o. male PMHx Chronic A fib with h/o DCCV and being on amio. Hypertrophic obstructive cardiomyopathy, aortic valve stenosis, chronic kidney disease stage V (self catheterizes) S/P  AV fistula placement on 12/18. He was seen in the cardiology office on 12/29. He was seen for SOB that had developed over the last few days- both at rest and with exertion. In clinic he was found with a HR of 120s (fib). Cardiology advised him to go to the hospital. Patient declined. His amiodarone was changed to diltiazem at that time. His Cr was found to be elevated as well during the visit. No CP, No fever, no chills. Patient states he drove over 200 miles today to take care of business.   In the ER, he was seen by cardiology who started him on cardizem gtt and asked hospitalist to admit as patient has CKD.  ER physician called nephrology to help guide diuresis   HPI/Subjective: 12/31 A/O 4, states SOB improved since admission. Patient initially stated SOB started just a couple days ago however wife stated he has had progressive SOB since September. Wife states that patient has difficulty climbing 1 flight of steps.    Assessment/Plan: Atrial fibrillation with RVR - continue cardizem gtt, rate better controlled would anticipate cardiology would like to transition over to PO by the a.m. Will await their recommendations  -Continue warfarin per pharmacy  -Cardiac enzymes mildly elevated but most likely secondary to demand ischemia + renal failure.   Aortic stenosis moderate;  - stable, patient has a holosystolic murmur 4/6   CKD stage V - not yet on dialysis but had AV fistula placed 12/18 -Strict in and out -Daily a.m. Weight -Lasix IV 80 mg BID -Spoke with Dr. Marval Regal (nephrology) who has agreed to  evaluate patient   Pulmonary edema/bilateral pleural effusion -See CKD stage V   SOB -continued SOB however improved from admission (most likely secondary to better rate control of A. fib)  -See CKD stage V     Code Status: FULL Family Communication:  wife present at time of exam Disposition Plan: rate controlled, diuresis   Consultants: Dr. Crissie Sickles (cardiology/EP)   Dr.Coladonato (nephrology)    Procedure/Significant Events: 7/20 echocardiogram;- Left ventricle: mild concentric hypertrophy.-LVEF= 55% to 60%.  -(grade 2 diastolic dysfunction). - Aortic valve: mild to moderate stenosis. -Left atrium: was severely dilated. - Right atrium: mildly dilated.    Culture NA   Antibiotics: NA  DVT prophylaxis:  warfarin    Devices   LINES / TUBES:      Continuous Infusions: . diltiazem (CARDIZEM) infusion 5 mg/hr (04/30/14 0700)    Objective: VITAL SIGNS: Temp: 98 F (36.7 C) (12/31 0700) Temp Source: Oral (12/31 0700) BP: 148/116 mmHg (12/31 0800) Pulse Rate: 70 (12/31 0355) SPO2; FIO2:   Intake/Output Summary (Last 24 hours) at 04/30/14 0921 Last data filed at 04/30/14 0900  Gross per 24 hour  Intake 785.08 ml  Output   1075 ml  Net -289.92 ml     Exam: General:  A/O 4, NAD, mild acute on chronic  respiratory distress Lungs:  mild diffuse rales, negative wheezes or crackles Cardiovascular:  irregular irregular rhythm and rate, systolic murmur grade 4/6, negative gallop or rub normal S1 and S2 Abdomen: Nontender, nondistended, soft, bowel sounds positive, no rebound, no  ascites, no appreciable mass Extremities: No significant cyanosis, clubbing. Bilateral +1-2 pedal edema to knees   Data Reviewed: Basic Metabolic Panel:  Recent Labs Lab 04/28/14 1700 04/29/14 1616  NA 139 141  K 4.6 4.5  CL 109 112  CO2 17* 17*  GLUCOSE 113* 117*  BUN 69* 65*  CREATININE 5.1* 5.24*  CALCIUM 9.3 9.4   Liver Function Tests: No results for  input(s): AST, ALT, ALKPHOS, BILITOT, PROT, ALBUMIN in the last 168 hours. No results for input(s): LIPASE, AMYLASE in the last 168 hours. No results for input(s): AMMONIA in the last 168 hours. CBC:  Recent Labs Lab 04/28/14 1700 04/29/14 1616 04/30/14 0632  WBC 9.8 9.7 9.5  HGB 11.7* 11.6* 10.3*  HCT 35.7* 35.6* 31.7*  MCV 96.6 94.7 96.4  PLT 275.0 279 239   Cardiac Enzymes:  Recent Labs Lab 04/29/14 2020 04/30/14 0026 04/30/14 0632  TROPONINI 0.04* 0.03 0.04*   BNP (last 3 results)  Recent Labs  07/22/13 1440  PROBNP 580.0*   CBG: No results for input(s): GLUCAP in the last 168 hours.  Recent Results (from the past 240 hour(s))  MRSA PCR Screening     Status: None   Collection Time: 04/29/14  6:50 PM  Result Value Ref Range Status   MRSA by PCR NEGATIVE NEGATIVE Final    Comment:        The GeneXpert MRSA Assay (FDA approved for NASAL specimens only), is one component of a comprehensive MRSA colonization surveillance program. It is not intended to diagnose MRSA infection nor to guide or monitor treatment for MRSA infections.      Studies:  Recent x-ray studies have been reviewed in detail by the Attending Physician  Scheduled Meds:  Scheduled Meds: . calcitRIOL  0.25 mcg Oral Daily  . cholecalciferol  1,000 Units Oral Daily  . sodium bicarbonate  1,300 mg Oral BID  . sodium chloride  3 mL Intravenous Q12H    Time spent on care of this patient: 40 mins   Allie Bossier , MD   [facility] Office  [phone] Pager - [phone]  On-Call/Text Page:      [email or web address]      password [email or web address]  If 7PM-7AM, please contact night-coverage [email or web address] Password [email or web address] 04/30/2014, 9:21 AM   LOS: 1 day

## 2014-04-30 NOTE — Consult Note (Signed)
Reason for Consult:AKI/CKD Referring Physician: [name], MD  [name] is an 73 y.o. male.  HPI: Pt is a 73yo WM with PMH sig for HTN, Hypertrophic obstructive cardiomyopathy, Aortic stenosis, diastolic dysfunction, P. A fib s/p cardioversion, bladder cancer, s/p bladder resection and pouch (with self-catheterization), and CKD stage 4 (followed by Dr. Jimmy Footman) who presented to his Cardiologists office with increasing SOB and was found to be in A fib with RVR.  We were asked to help evaluate and manage his progressive CKD.  The trend is seen below.  Of note, he recently had an AVF placed on 04/17/14.   Trend in Creatinine: CREATININE, SER  Date/Time Value Ref Range Status  04/30/2014 06:32 AM 4.81* 0.50 - 1.35 mg/dL Final  04/29/2014 04:16 PM 5.24* 0.50 - 1.35 mg/dL Final  04/28/2014 05:00 PM 5.1* 0.4 - 1.5 mg/dL Final  07/22/2013 02:40 PM 4.5* 0.4 - 1.5 mg/dL Final  11/21/2010 07:30 AM 2.34* 0.50 - 1.35 mg/dL Final  12/12/2006 11:53 AM 1.75*  Final  10/10/2006 03:55 AM 1.64*  Final  10/09/2006 05:00 AM 1.63*  Final  10/08/2006 05:00 PM 1.64*  Final    PMH:   Past Medical History  Diagnosis Date  . Paroxysmal atrial fibrillation     a. cardioversion 11/21/10 - Dr Radford Pax. b. 05/2013 - experiencing more SOB. 24-hr holter showed avg HR 49, max 82, min 38bpm. ETT showed baseline junctional bradycardia, HR incr only to 90bpm with drop in BP. Saw EP - amiodarone reduced and eventually d/c'd with improvement in dyspnea.  . Hypertension   . Warfarin anticoagulation   . CKD (chronic kidney disease), stage V     a. Followed by Dr. Jimmy Footman. b. IV fistula placed 04/17/14.  [name] Hypertrophic obstructive cardiomyopathy     a. Echo 10/2013 actually showed mild concentric LVH.  [name] Bladder cancer     a. 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  . Aortic valve stenosis     a. 10/2013 - mild to moderate stenosis by echo.  . Orthostatic hypotension   . Diastolic dysfunction   .  Arthritis   . Self-catheterizes urinary bladder   . Junctional bradycardia   . NSVT (nonsustained ventricular tachycardia)     a. By holter 01/2014.    PSH:   Past Surgical History  Procedure Laterality Date  . Cardioversion  11/21/2010  . Cystectomy    . Cholecystectomy    . Cataract extraction w/ intraocular lens  implant, bilateral    . Colonoscopy    . Av fistula placement Left 04/17/2014    Procedure: ARTERIOVENOUS (AV) FISTULA CREATION- LEFT UPPER ARM ;  Surgeon: Mal [name], MD;  Location: [facility];  Service: Vascular;  Laterality: Left;    Allergies:  Allergies  Allergen Reactions  . Amiodarone     Severely reduced DLCO  . Oxycodone Hcl Rash and Other (See Comments)    Rash and itching    Medications:   Prior to Admission medications   Medication Sig Start Date End Date Taking? Authorizing Provider  B Complex-C (SUPER B COMPLEX PO) Take 1 capsule by mouth daily.    Yes Historical Provider, MD  calcitRIOL (ROCALTROL) 0.25 MCG capsule Take 0.25 mcg by mouth daily. Alternate 1 or 2 capsules daily   Yes Historical Provider, MD  cholecalciferol (VITAMIN D) 1000 UNITS tablet Take 1,000 Units by mouth daily.   Yes Historical Provider, MD  Cinnamon 500 MG capsule Take 500 mg by mouth daily.  Yes Historical Provider, MD  diltiazem (CARDIZEM CD) 180 MG 24 hr capsule Take 1 capsule (180 mg total) by mouth daily. 04/28/14  Yes Dayna N Dunn, PA-C  glucosamine-chondroitin 500-400 MG tablet Take 1 tablet by mouth daily.   Yes Historical Provider, MD  Multiple Vitamins-Minerals (CENTRUM SILVER ULTRA MENS PO) Take 1 tablet by mouth daily. Take daily   Yes Historical Provider, MD  sodium bicarbonate 650 MG tablet Take 1,300 mg by mouth 2 (two) times daily.    Yes Historical Provider, MD  warfarin (COUMADIN) 5 MG tablet Take as directed by coumadin clinic Patient taking differently: Take 2.5 mg by mouth daily. Take as directed by coumadin clinic 12/04/13  Yes Sueanne Margarita, MD  CINNAMON  PO Take 2,000 mg by mouth daily.    Historical Provider, MD  Misc Natural Products (OSTEO BI-FLEX ADV TRIPLE ST PO) Take 1 tablet by mouth daily.     Historical Provider, MD    Inpatient medications: . calcitRIOL  0.25 mcg Oral Daily  . cholecalciferol  1,000 Units Oral Daily  . diltiazem  60 mg Oral 3 times per day  . furosemide  80 mg Intravenous BID  . metoprolol succinate  25 mg Oral Daily  . sodium bicarbonate  1,300 mg Oral BID  . sodium chloride  3 mL Intravenous Q12H    Discontinued Meds:   Medications Discontinued During This Encounter  Medication Reason  . diltiazem (CARDIZEM) 100 mg in dextrose 5 % 100 mL (1 mg/mL) infusion     Social History:  reports that he quit smoking about 31 years ago. His smoking use included Cigarettes. He has a 66 pack-year smoking history. He has never used smokeless tobacco. He reports that he does not drink alcohol or use illicit drugs.  Family History:   Family History  Problem Relation Age of Onset  . CVA Mother   . Heart attack Father   . Heart disease Father   . Arrhythmia Father   . Alzheimer's disease Sister   . Multiple sclerosis Brother   . Alcohol abuse Father     Pertinent items are noted in HPI. Weight change:   Intake/Output Summary (Last 24 hours) at 04/30/14 1025 Last data filed at 04/30/14 0900  Gross per 24 hour  Intake 785.08 ml  Output   1775 ml  Net -989.92 ml   BP 129/82 mmHg  Pulse 70  Temp(Src) 98 F (36.7 C) (Oral)  Resp 25  Ht 5\' 8"  (1.727 m)  Wt 81.2 kg (179 lb 0.2 oz)  BMI 27.23 kg/m2  SpO2 92% Filed Vitals:   04/30/14 0600 04/30/14 0700 04/30/14 0800 04/30/14 0900  BP: 138/78 132/64 148/116 129/82  Pulse:      Temp:  98 F (36.7 C)    TempSrc:  Oral    Resp: 18 16 27 25   Height:      Weight:      SpO2: 88% 95% 96% 92%     General appearance: alert, cooperative and no distress Head: Normocephalic, without obvious abnormality, atraumatic Eyes: negative findings: lids and lashes normal,  conjunctivae and sclerae normal and corneas clear Neck: no adenopathy, no JVD, supple, symmetrical, trachea midline, thyroid not enlarged, symmetric, no tenderness/mass/nodules and transmitted murmer bilateral carotids Resp: clear to auscultation bilaterally Cardio: regular rate and rhythm and III/VI SEM radiating to carotids, no rub GI: soft, non-tender; bowel sounds normal; no masses,  no organomegaly Extremities: edema trace and LUE AVF +T/B  Labs: Basic Metabolic Panel:  Recent Labs Lab 04/28/14 1700 04/29/14 1616 04/30/14 0632  NA 139 141 141  K 4.6 4.5 4.3  CL 109 112 115*  CO2 17* 17* 13*  GLUCOSE 113* 117* 88  BUN 69* 65* 63*  CREATININE 5.1* 5.24* 4.81*  CALCIUM 9.3 9.4 8.9   Liver Function Tests: No results for input(s): AST, ALT, ALKPHOS, BILITOT, PROT, ALBUMIN in the last 168 hours. No results for input(s): LIPASE, AMYLASE in the last 168 hours. No results for input(s): AMMONIA in the last 168 hours. CBC:  Recent Labs Lab 04/28/14 1700 04/29/14 1616 04/30/14 0632  WBC 9.8 9.7 9.5  HGB 11.7* 11.6* 10.3*  HCT 35.7* 35.6* 31.7*  MCV 96.6 94.7 96.4  PLT 275.0 279 239   PT/INR: @LABRCNTIP (inr:5) Cardiac Enzymes: ) Recent Labs Lab 04/29/14 2020 04/30/14 0026 04/30/14 0632  TROPONINI 0.04* 0.03 0.04*   CBG: No results for input(s): GLUCAP in the last 168 hours.  Iron Studies: No results for input(s): IRON, TIBC, TRANSFERRIN, FERRITIN in the last 168 hours.  Xrays/Other Studies: Dg Chest 2 View  04/29/2014   CLINICAL DATA:  Chest pain.  Shortness of breath for 3 months.  EXAM: CHEST  2 VIEW  COMPARISON:  07/17/2013.  FINDINGS: Trachea is midline. Heart size stable. Mild diffuse interstitial prominence and indistinctness. Small bilateral effusions, left greater than right, with compressive atelectasis at the left lung base.  IMPRESSION: Favor pulmonary edema with small bilateral effusions, left greater than right.   Electronically Signed   By: Lorin Picket M.D.   On: 04/29/2014 16:51     Assessment/Plan: 1.  AKI/CKD- likely due to A Fib with RVR, already improving 2. 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 Metabolic acidosis- will start po bicarb 4. A fib with RVR- no rate controlled, plan per cardiology 5. Aortic stenosis- stable 6. pulm edema- due to A FIb with RVR- diuresed well and will cont to follow 7. Bladder cancer- s/p cystectomy, cont with self-catheterization 8. Anemia of chronic disease- follow but may need to start IV Iron and epo   Cozetta Seif A 04/30/2014, 10:25 AM

## 2014-04-30 NOTE — Care Management Note (Signed)
    Page 1 of 1   04/30/2014     8:19:01 AM CARE MANAGEMENT NOTE 04/30/2014  Patient:  [name], [name]   Account Number:  [id number]  Date Initiated:  04/30/2014  Documentation initiated by:  Elissa Hefty  Subjective/Objective Assessment:   adm w at fib     Action/Plan:   lives w wife   Anticipated DC Date:     Anticipated DC Plan:           Choice offered to / List presented to:             Status of service:   Medicare Important Message given?   (If response is "NO", the following Medicare IM given date fields will be blank) Date Medicare IM given:   Medicare IM given by:   Date Additional Medicare IM given:   Additional Medicare IM given by:    Discharge Disposition:    Per UR Regulation:  Reviewed for med. necessity/level of care/duration of stay  If discussed at [place] of Stay Meetings, dates discussed:    Comments:

## 2014-05-01 DIAGNOSIS — E872 Acidosis, unspecified: Secondary | ICD-10-CM | POA: Insufficient documentation

## 2014-05-01 DIAGNOSIS — J810 Acute pulmonary edema: Secondary | ICD-10-CM

## 2014-05-01 DIAGNOSIS — N185 Chronic kidney disease, stage 5: Secondary | ICD-10-CM

## 2014-05-01 LAB — CBC WITH DIFFERENTIAL/PLATELET
BASOS PCT: 0 % (ref 0–1)
Basophils Absolute: 0 10*3/uL (ref 0.0–0.1)
Eosinophils Absolute: 0.4 10*3/uL (ref 0.0–0.7)
Eosinophils Relative: 4 % (ref 0–5)
HCT: 35.1 % — ABNORMAL LOW (ref 39.0–52.0)
HEMOGLOBIN: 11.7 g/dL — AB (ref 13.0–17.0)
LYMPHS ABS: 1.7 10*3/uL (ref 0.7–4.0)
Lymphocytes Relative: 17 % (ref 12–46)
MCH: 32 pg (ref 26.0–34.0)
MCHC: 33.3 g/dL (ref 30.0–36.0)
MCV: 95.9 fL (ref 78.0–100.0)
Monocytes Absolute: 0.8 10*3/uL (ref 0.1–1.0)
Monocytes Relative: 8 % (ref 3–12)
NEUTROS ABS: 7.5 10*3/uL (ref 1.7–7.7)
NEUTROS PCT: 71 % (ref 43–77)
PLATELETS: 245 10*3/uL (ref 150–400)
RBC: 3.66 MIL/uL — ABNORMAL LOW (ref 4.22–5.81)
RDW: 16 % — ABNORMAL HIGH (ref 11.5–15.5)
WBC: 10.4 10*3/uL (ref 4.0–10.5)

## 2014-05-01 LAB — BASIC METABOLIC PANEL
Anion gap: 12 (ref 5–15)
BUN: 58 mg/dL — AB (ref 6–23)
CALCIUM: 8.5 mg/dL (ref 8.4–10.5)
CO2: 23 mmol/L (ref 19–32)
CREATININE: 4.41 mg/dL — AB (ref 0.50–1.35)
Chloride: 106 mEq/L (ref 96–112)
GFR, EST AFRICAN AMERICAN: 14 mL/min — AB (ref >90)
GFR, EST NON AFRICAN AMERICAN: 12 mL/min — AB (ref >90)
GLUCOSE: 99 mg/dL (ref 70–99)
Potassium: 3.9 mmol/L (ref 3.5–5.1)
Sodium: 141 mmol/L (ref 135–145)

## 2014-05-01 LAB — COMPREHENSIVE METABOLIC PANEL
ALBUMIN: 3.6 g/dL (ref 3.5–5.2)
ALT: 28 U/L (ref 0–53)
AST: 26 U/L (ref 0–37)
Alkaline Phosphatase: 48 U/L (ref 39–117)
Anion gap: 18 — ABNORMAL HIGH (ref 5–15)
BILIRUBIN TOTAL: 0.5 mg/dL (ref 0.3–1.2)
BUN: 61 mg/dL — AB (ref 6–23)
CALCIUM: 9 mg/dL (ref 8.4–10.5)
CHLORIDE: 111 meq/L (ref 96–112)
CO2: 13 mmol/L — AB (ref 19–32)
Creatinine, Ser: 4.72 mg/dL — ABNORMAL HIGH (ref 0.50–1.35)
GFR calc Af Amer: 13 mL/min — ABNORMAL LOW (ref >90)
GFR calc non Af Amer: 11 mL/min — ABNORMAL LOW (ref >90)
GLUCOSE: 92 mg/dL (ref 70–99)
POTASSIUM: 3.7 mmol/L (ref 3.5–5.1)
Sodium: 142 mmol/L (ref 135–145)
Total Protein: 6.9 g/dL (ref 6.0–8.3)

## 2014-05-01 LAB — PROTIME-INR
INR: 1.95 — AB (ref 0.00–1.49)
PROTHROMBIN TIME: 22.4 s — AB (ref 11.6–15.2)

## 2014-05-01 LAB — MAGNESIUM: MAGNESIUM: 2 mg/dL (ref 1.5–2.5)

## 2014-05-01 MED ORDER — METOPROLOL SUCCINATE ER 50 MG PO TB24
50.0000 mg | ORAL_TABLET | Freq: Every day | ORAL | Status: DC
  Administered 2014-05-01 – 2014-05-02 (×2): 50 mg via ORAL
  Filled 2014-05-01 (×3): qty 1

## 2014-05-01 MED ORDER — DILTIAZEM HCL ER COATED BEADS 240 MG PO CP24
240.0000 mg | ORAL_CAPSULE | Freq: Every day | ORAL | Status: DC
  Administered 2014-05-01 – 2014-05-02 (×2): 240 mg via ORAL
  Filled 2014-05-01 (×3): qty 1

## 2014-05-01 MED ORDER — SODIUM BICARBONATE 8.4 % IV SOLN
100.0000 meq | Freq: Once | INTRAVENOUS | Status: AC
  Administered 2014-05-01: 100 meq via INTRAVENOUS
  Filled 2014-05-01: qty 100

## 2014-05-01 MED ORDER — WARFARIN SODIUM 5 MG PO TABS
5.0000 mg | ORAL_TABLET | Freq: Once | ORAL | Status: AC
  Administered 2014-05-01: 5 mg via ORAL
  Filled 2014-05-01: qty 1

## 2014-05-01 NOTE — Progress Notes (Signed)
[place] TEAM 1 - Stepdown/ICU TEAM Progress Note  [name] BJY:[id number] DOB: 15-Jul-1940 DOA: 04/29/2014 PCP: Pcp Not In System  Admit HPI / Brief Narrative: [name] is a 74 y.o. WM PMHx Chronic A fib with h/o DCCV and being on amio. Hypertrophic obstructive cardiomyopathy, aortic valve stenosis, chronic kidney disease stage V (self catheterizes) S/P  AV fistula placement on 12/18. He was seen in the cardiology office on 12/29. He was seen for SOB that had developed over the last few days- both at rest and with exertion. In clinic he was found with a HR of 120s (fib). Cardiology advised him to go to the hospital. Patient declined. His amiodarone was changed to diltiazem at that time. His Cr was found to be elevated as well during the visit. No CP, No fever, no chills. Patient states he drove over 200 miles today to take care of business.   In the ER, he was seen by cardiology who started him on cardizem gtt and asked hospitalist to admit as patient has CKD.  ER physician called nephrology to help guide diuresis   HPI/Subjective: 12/31 A/O 4, states SOB improved since admission. Patient initially stated SOB started just a couple days ago however wife stated he has had progressive SOB since September. Wife states that patient has difficulty climbing 1 flight of steps.    Assessment/Plan: Atrial fibrillation with RVR - continue cardizem gtt, rate better controlled would anticipate cardiology would like to transition over to PO by the a.m. Will await their recommendations  -Continue warfarin per pharmacy  -Cardiac enzymes mildly elevated but most likely secondary to demand ischemia + renal failure.   Aortic stenosis moderate;  - stable, patient has a holosystolic murmur 4/6   CKD stage V - not yet on dialysis but had AV fistula placed 12/18 -Strict in and out; since admission -5 L  -Daily a.m. Weight; admission weight= 81.2 kg,   1/1 weight standing= 81.2 kg;   weights are inaccurate  Metabolic acidosis -Sodium bicarbonate 1Amp -Continue sodium bicarbonate 1300 mg BID -Repeat BMP and 1500  Pulmonary edema/bilateral pleural effusion -See CKD stage V   SOB -Resolved with rate control of A. fib and diuresis.   -See CKD stage V     Code Status: FULL Family Communication:  wife present at time of exam Disposition Plan: rate controlled, diuresis   Consultants: Dr. Crissie Sickles (cardiology/EP)   Dr.Coladonato (nephrology)    Procedure/Significant Events: 7/20 echocardiogram;- Left ventricle: mild concentric hypertrophy.-LVEF= 55% to 60%.  -(grade 2 diastolic dysfunction). - Aortic valve: mild to moderate stenosis. -Left atrium: was severely dilated. - Right atrium: mildly dilated.    Culture NA   Antibiotics: NA  DVT prophylaxis:  warfarin    Devices   LINES / TUBES:      Continuous Infusions:    Objective: VITAL SIGNS: Temp: 98.4 F (36.9 C) (01/01 0719) Temp Source: Oral (01/01 0719) BP: 128/79 mmHg (01/01 0700) Pulse Rate: 74 (01/01 0719) SPO2; FIO2:   Intake/Output Summary (Last 24 hours) at 05/01/14 1058 Last data filed at 05/01/14 0900  Gross per 24 hour  Intake   1085 ml  Output   5000 ml  Net  -3915 ml     Exam: General:  A/O 4, NAD, negative respiratory distress Lungs:  Clear to auscultation bilateral  Cardiovascular:  irregular irregular rhythm and rate, systolic murmur grade 4/6, negative gallop or rub normal S1 and S2 Abdomen: Nontender, nondistended, soft, bowel sounds positive, no  rebound, no ascites, no appreciable mass Extremities: No significant cyanosis, clubbing. Bilateral +1-2 pedal edema to knees   Data Reviewed: Basic Metabolic Panel:  Recent Labs Lab 04/28/14 1700 04/29/14 1616 04/30/14 0632 05/01/14 0315  NA 139 141 141 142  K 4.6 4.5 4.3 3.7  CL 109 112 115* 111  CO2 17* 17* 13* 13*  GLUCOSE 113* 117* 88 92  BUN 69* 65* 63* 61*  CREATININE 5.1* 5.24* 4.81*  4.72*  CALCIUM 9.3 9.4 8.9 9.0  MG  --   --   --  2.0   Liver Function Tests:  Recent Labs Lab 05/01/14 0315  AST 26  ALT 28  ALKPHOS 48  BILITOT 0.5  PROT 6.9  ALBUMIN 3.6   No results for input(s): LIPASE, AMYLASE in the last 168 hours. No results for input(s): AMMONIA in the last 168 hours. CBC:  Recent Labs Lab 04/28/14 1700 04/29/14 1616 04/30/14 0632 05/01/14 0315  WBC 9.8 9.7 9.5 10.4  NEUTROABS  --   --   --  7.5  HGB 11.7* 11.6* 10.3* 11.7*  HCT 35.7* 35.6* 31.7* 35.1*  MCV 96.6 94.7 96.4 95.9  PLT 275.0 279 239 245   Cardiac Enzymes:  Recent Labs Lab 04/29/14 2020 04/30/14 0026 04/30/14 0632  TROPONINI 0.04* 0.03 0.04*   BNP (last 3 results)  Recent Labs  07/22/13 1440  PROBNP 580.0*   CBG: No results for input(s): GLUCAP in the last 168 hours.  Recent Results (from the past 240 hour(s))  MRSA PCR Screening     Status: None   Collection Time: 04/29/14  6:50 PM  Result Value Ref Range Status   MRSA by PCR NEGATIVE NEGATIVE Final    Comment:        The GeneXpert MRSA Assay (FDA approved for NASAL specimens only), is one component of a comprehensive MRSA colonization surveillance program. It is not intended to diagnose MRSA infection nor to guide or monitor treatment for MRSA infections.      Studies:  Recent x-ray studies have been reviewed in detail by the Attending Physician  Scheduled Meds:  Scheduled Meds: . calcitRIOL  0.25 mcg Oral Daily  . cholecalciferol  1,000 Units Oral Daily  . diltiazem  240 mg Oral Daily  . metoprolol succinate  50 mg Oral Daily  . sodium bicarbonate  1,300 mg Oral BID  . sodium chloride  3 mL Intravenous Q12H  . warfarin  5 mg Oral ONCE-1800  . Warfarin - Pharmacist Dosing Inpatient   Does not apply q1800    Time spent on care of this patient: 40 mins   Allie Bossier , MD   [facility] Office  [phone] Pager [phone]  On-Call/Text Page:      [email or web address]       password [email or web address]  If 7PM-7AM, please contact night-coverage [email or web address] Password [email or web address] 05/01/2014, 10:58 AM   LOS: 2 days

## 2014-05-01 NOTE — Progress Notes (Signed)
Patient ID: KNIGHT OELKERS, male   DOB: 1941/04/26, 74 y.o.   MRN: [id number]    Patient Name: [name] Date of Encounter: 05/01/2014     Active Problems:   Atrial fibrillation   Aortic stenosis, moderate   Dyspnea   Chronic kidney disease, stage V   Atrial fibrillation with RVR   Acute pulmonary edema   Pleural effusion    SUBJECTIVE  Feels better, denies dyspnea  CURRENT MEDS . calcitRIOL  0.25 mcg Oral Daily  . cholecalciferol  1,000 Units Oral Daily  . diltiazem  60 mg Oral 3 times per day  . furosemide  80 mg Intravenous BID  . metoprolol succinate  25 mg Oral Daily  . sodium bicarbonate  1,300 mg Oral BID  . sodium chloride  3 mL Intravenous Q12H  . Warfarin - Pharmacist Dosing Inpatient   Does not apply q1800    OBJECTIVE  Filed Vitals:   05/01/14 0300 05/01/14 0400 05/01/14 0500 05/01/14 0600  BP: 134/76 128/82 130/68 150/89  Pulse: 79     Temp: 98.6 F (37 C)     TempSrc: Oral     Resp: 18     Height:      Weight: 179 lb 0.2 oz (81.2 kg)     SpO2: 91% 93% 93% 96%    Intake/Output Summary (Last 24 hours) at 05/01/14 0714 Last data filed at 05/01/14 0600  Gross per 24 hour  Intake   1240 ml  Output   5700 ml  Net  -4460 ml   Filed Weights   04/29/14 1922 04/30/14 1300 05/01/14 0300  Weight: 179 lb 0.2 oz (81.2 kg) 183 lb 3.2 oz (83.1 kg) 179 lb 0.2 oz (81.2 kg)    PHYSICAL EXAM  General: Pleasant, NAD. Neuro: Alert and oriented X 3. Moves all extremities spontaneously. Psych: Normal affect. HEENT:  Normal  Neck: Supple without bruits or JVD. Lungs:  Resp regular and unlabored, CTA. Heart: IRRR no s3, s4, or murmurs. Abdomen: Soft, non-tender, non-distended, BS + x 4.  Extremities: No clubbing, cyanosis or edema. DP/PT/Radials 2+ and equal bilaterally.  Accessory Clinical Findings  CBC  Recent Labs  04/30/14 0632 05/01/14 0315  WBC 9.5 10.4  NEUTROABS  --  7.5  HGB 10.3* 11.7*  HCT 31.7* 35.1*  MCV 96.4 95.9  PLT 239 245     Basic Metabolic Panel  Recent Labs  04/29/14 1616 04/30/14 0632  NA 141 141  K 4.5 4.3  CL 112 115*  CO2 17* 13*  GLUCOSE 117* 88  BUN 65* 63*  CREATININE 5.24* 4.81*  CALCIUM 9.4 8.9   Liver Function Tests No results for input(s): AST, ALT, ALKPHOS, BILITOT, PROT, ALBUMIN in the last 72 hours. No results for input(s): LIPASE, AMYLASE in the last 72 hours. Cardiac Enzymes  Recent Labs  04/29/14 2020 04/30/14 0026 04/30/14 0632  TROPONINI 0.04* 0.03 0.04*   BNP Invalid input(s): POCBNP D-Dimer No results for input(s): DDIMER in the last 72 hours. Hemoglobin A1C No results for input(s): HGBA1C in the last 72 hours. Fasting Lipid Panel No results for input(s): CHOL, HDL, LDLCALC, TRIG, CHOLHDL, LDLDIRECT in the last 72 hours. Thyroid Function Tests  Recent Labs  04/28/14 1700  TSH 2.46    TELE  Atrial fib with a controlled/rapid VR  Radiology/Studies  Dg Chest 2 View  04/29/2014   CLINICAL DATA:  Chest pain.  Shortness of breath for 3 months.  EXAM: CHEST  2 VIEW  COMPARISON:  07/17/2013.  FINDINGS: Trachea is midline. Heart size stable. Mild diffuse interstitial prominence and indistinctness. Small bilateral effusions, left greater than right, with compressive atelectasis at the left lung base.  IMPRESSION: Favor pulmonary edema with small bilateral effusions, left greater than right.   Electronically Signed   By: Lorin Picket M.D.   On: 04/29/2014 16:51    ASSESSMENT AND PLAN  1. Atrial fib with an RVR 2. Acute on chronic diastolic heart failure 3. 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 He has had a very nice diuresis and his atrial fib is nearly controlled. Will uptitrate his calcium channel blocker today and anticipate discharge tomorrow.  Davida Falconi,M.D.  05/01/2014 7:14 AM

## 2014-05-01 NOTE — Progress Notes (Signed)
Patient ID: [name], male   DOB: 1940/09/13, 74 y.o.   MRN: [id number] S:Feels much better O:BP 128/79 mmHg  Pulse 74  Temp(Src) 98.4 F (36.9 C) (Oral)  Resp 18  Ht 5\' 8"  (1.727 m)  Wt 81.2 kg (179 lb 0.2 oz)  BMI 27.23 kg/m2  SpO2 92%  Intake/Output Summary (Last 24 hours) at 05/01/14 1035 Last data filed at 05/01/14 0900  Gross per 24 hour  Intake   1085 ml  Output   5000 ml  Net  -3915 ml   Intake/Output: I/O last 3 completed shifts: In: 1815 [P.O.:1700; I.V.:115] Out: 6775 [Urine:6775]  Intake/Output this shift:  Total I/O In: 360 [P.O.:360] Out: -  Weight change: 1.9 kg (4 lb 3 oz) Gen:WD WN WM in NAD CVS:IRR IRR Resp:cta IRS:WNIOEV Ext:tr edema, LUE AVF +T/B   Recent Labs Lab 04/28/14 1700 04/29/14 1616 04/30/14 0632 05/01/14 0315  NA 139 141 141 142  K 4.6 4.5 4.3 3.7  CL 109 112 115* 111  CO2 17* 17* 13* 13*  GLUCOSE 113* 117* 88 92  BUN 69* 65* 63* 61*  CREATININE 5.1* 5.24* 4.81* 4.72*  ALBUMIN  --   --   --  3.6  CALCIUM 9.3 9.4 8.9 9.0  AST  --   --   --  26  ALT  --   --   --  28   Liver Function Tests:  Recent Labs Lab 05/01/14 0315  AST 26  ALT 28  ALKPHOS 48  BILITOT 0.5  PROT 6.9  ALBUMIN 3.6   No results for input(s): LIPASE, AMYLASE in the last 168 hours. No results for input(s): AMMONIA in the last 168 hours. CBC:  Recent Labs Lab 04/28/14 1700 04/29/14 1616 04/30/14 0632 05/01/14 0315  WBC 9.8 9.7 9.5 10.4  NEUTROABS  --   --   --  7.5  HGB 11.7* 11.6* 10.3* 11.7*  HCT 35.7* 35.6* 31.7* 35.1*  MCV 96.6 94.7 96.4 95.9  PLT 275.0 279 239 245   Cardiac Enzymes:  Recent Labs Lab 04/29/14 2020 04/30/14 0026 04/30/14 0632  TROPONINI 0.04* 0.03 0.04*   CBG: No results for input(s): GLUCAP in the last 168 hours.  Iron Studies: No results for input(s): IRON, TIBC, TRANSFERRIN, FERRITIN in the last 72 hours. Studies/Results: Dg Chest 2 View  04/29/2014   CLINICAL DATA:  Chest pain.  Shortness of breath  for 3 months.  EXAM: CHEST  2 VIEW  COMPARISON:  07/17/2013.  FINDINGS: Trachea is midline. Heart size stable. Mild diffuse interstitial prominence and indistinctness. Small bilateral effusions, left greater than right, with compressive atelectasis at the left lung base.  IMPRESSION: Favor pulmonary edema with small bilateral effusions, left greater than right.   Electronically Signed   By: Lorin Picket M.D.   On: 04/29/2014 16:51   . calcitRIOL  0.25 mcg Oral Daily  . cholecalciferol  1,000 Units Oral Daily  . diltiazem  240 mg Oral Daily  . furosemide  80 mg Intravenous BID  . metoprolol succinate  50 mg Oral Daily  . sodium bicarbonate  1,300 mg Oral BID  . sodium chloride  3 mL Intravenous Q12H  . warfarin  5 mg Oral ONCE-1800  . Warfarin - Pharmacist Dosing Inpatient   Does not apply q1800    BMET    Component Value Date/Time   NA 142 05/01/2014 0315   K 3.7 05/01/2014 0315   CL 111 05/01/2014 0315   CO2 13* 05/01/2014 0315  GLUCOSE 92 05/01/2014 0315   BUN 61* 05/01/2014 0315   CREATININE 4.72* 05/01/2014 0315   CALCIUM 9.0 05/01/2014 0315   GFRNONAA 11* 05/01/2014 0315   GFRAA 13* 05/01/2014 0315   CBC    Component Value Date/Time   WBC 10.4 05/01/2014 0315   RBC 3.66* 05/01/2014 0315   HGB 11.7* 05/01/2014 0315   HCT 35.1* 05/01/2014 0315   PLT 245 05/01/2014 0315   MCV 95.9 05/01/2014 0315   MCH 32.0 05/01/2014 0315   MCHC 33.3 05/01/2014 0315   RDW 16.0* 05/01/2014 0315   LYMPHSABS 1.7 05/01/2014 0315   MONOABS 0.8 05/01/2014 0315   EOSABS 0.4 05/01/2014 0315   BASOSABS 0.0 05/01/2014 0315     Assessment/Plan: 1. AKI/CKD- likely due to [name] Fib with RVR, already improving 2. 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 Follow up with Dr. Jimmy Footman as an outpt 3. Metabolic acidosis- continue with po bicarb and follow 4. [name] fib with RVR- no rate controlled, plan per cardiology 5. Aortic stenosis- stable 6. pulm edema- due to [name] FIb  with RVR- diuresed well and will cont to follow 1. Marked diuresis, would switch to po 40mg  daily and stop IV today 7. Bladder cancer- s/p cystectomy, cont with self-catheterization 8. Anemia of chronic disease- follow but may need to start IV Iron and epo 9. Dispo- possible discharge in the next 24 hours  [name]

## 2014-05-01 NOTE — Progress Notes (Signed)
ANTICOAGULATION CONSULT NOTE - Follow Up Consult  Pharmacy Consult for Coumadin Indication: atrial fibrillation  Allergies  Allergen Reactions  . Amiodarone     Severely reduced DLCO  . Oxycodone Hcl Rash and Other (See Comments)    Rash and itching    Patient Measurements: Height: 5\' 8"  (172.7 cm) Weight: 179 lb 0.2 oz (81.2 kg) IBW/kg (Calculated) : 68.4  Vital Signs: Temp: 98.4 F (36.9 C) (01/01 0719) Temp Source: Oral (01/01 0719) BP: 128/79 mmHg (01/01 0700) Pulse Rate: 74 (01/01 0719)  Labs:  Recent Labs  04/29/14 1616 04/29/14 1640 04/29/14 2020 04/30/14 0026 04/30/14 0632 05/01/14 0315  HGB 11.6*  --   --   --  10.3* 11.7*  HCT 35.6*  --   --   --  31.7* 35.1*  PLT 279  --   --   --  239 245  LABPROT  --  24.3*  --   --  25.6* 22.4*  INR  --  2.16*  --   --  2.32* 1.95*  CREATININE 5.24*  --   --   --  4.81* 4.72*  TROPONINI  --   --  0.04* 0.03 0.04*  --     Estimated Creatinine Clearance: 13.3 mL/min (by C-G formula based on Cr of 4.72).  Assessment: 73yom continues on coumadin for afib. Coumadin was held for AV fistula placement on 12/18 but has since been resumed. INR is slightly subtherapeutic today. CBC stable. No bleeding reported.  Home dose 2.5mg  daily  Goal of Therapy:  INR 2-3 Monitor platelets by anticoagulation protocol: Yes   Plan:  1) Coumadin 5 mg x 1 2) INR in AM  Uvaldo Rising, BCPS  Clinical Pharmacist Pager [phone]  05/01/2014 9:27 AM

## 2014-05-02 DIAGNOSIS — I420 Dilated cardiomyopathy: Secondary | ICD-10-CM

## 2014-05-02 LAB — PROTIME-INR
INR: 2.24 — AB (ref 0.00–1.49)
PROTHROMBIN TIME: 24.9 s — AB (ref 11.6–15.2)

## 2014-05-02 LAB — COMPREHENSIVE METABOLIC PANEL
ALK PHOS: 41 U/L (ref 39–117)
ALT: 24 U/L (ref 0–53)
AST: 22 U/L (ref 0–37)
Albumin: 3.3 g/dL — ABNORMAL LOW (ref 3.5–5.2)
Anion gap: 15 (ref 5–15)
BUN: 59 mg/dL — AB (ref 6–23)
CHLORIDE: 104 meq/L (ref 96–112)
CO2: 20 mmol/L (ref 19–32)
CREATININE: 4.19 mg/dL — AB (ref 0.50–1.35)
Calcium: 8.3 mg/dL — ABNORMAL LOW (ref 8.4–10.5)
GFR calc Af Amer: 15 mL/min — ABNORMAL LOW (ref >90)
GFR, EST NON AFRICAN AMERICAN: 13 mL/min — AB (ref >90)
Glucose, Bld: 82 mg/dL (ref 70–99)
Potassium: 3.4 mmol/L — ABNORMAL LOW (ref 3.5–5.1)
Sodium: 139 mmol/L (ref 135–145)
Total Bilirubin: 0.2 mg/dL — ABNORMAL LOW (ref 0.3–1.2)
Total Protein: 6.1 g/dL (ref 6.0–8.3)

## 2014-05-02 LAB — CBC WITH DIFFERENTIAL/PLATELET
Basophils Absolute: 0 10*3/uL (ref 0.0–0.1)
Basophils Relative: 0 % (ref 0–1)
Eosinophils Absolute: 0.4 10*3/uL (ref 0.0–0.7)
Eosinophils Relative: 4 % (ref 0–5)
HCT: 33.1 % — ABNORMAL LOW (ref 39.0–52.0)
HEMOGLOBIN: 11 g/dL — AB (ref 13.0–17.0)
LYMPHS ABS: 2 10*3/uL (ref 0.7–4.0)
Lymphocytes Relative: 20 % (ref 12–46)
MCH: 32.1 pg (ref 26.0–34.0)
MCHC: 33.2 g/dL (ref 30.0–36.0)
MCV: 96.5 fL (ref 78.0–100.0)
Monocytes Absolute: 0.7 10*3/uL (ref 0.1–1.0)
Monocytes Relative: 7 % (ref 3–12)
NEUTROS PCT: 69 % (ref 43–77)
Neutro Abs: 6.8 10*3/uL (ref 1.7–7.7)
PLATELETS: 253 10*3/uL (ref 150–400)
RBC: 3.43 MIL/uL — ABNORMAL LOW (ref 4.22–5.81)
RDW: 16.3 % — ABNORMAL HIGH (ref 11.5–15.5)
WBC: 9.9 10*3/uL (ref 4.0–10.5)

## 2014-05-02 LAB — BASIC METABOLIC PANEL
ANION GAP: 11 (ref 5–15)
BUN: 60 mg/dL — AB (ref 6–23)
CO2: 22 mmol/L (ref 19–32)
Calcium: 8.1 mg/dL — ABNORMAL LOW (ref 8.4–10.5)
Chloride: 104 mEq/L (ref 96–112)
Creatinine, Ser: 4.2 mg/dL — ABNORMAL HIGH (ref 0.50–1.35)
GFR calc Af Amer: 15 mL/min — ABNORMAL LOW (ref >90)
GFR calc non Af Amer: 13 mL/min — ABNORMAL LOW (ref >90)
Glucose, Bld: 85 mg/dL (ref 70–99)
Potassium: 3.4 mmol/L — ABNORMAL LOW (ref 3.5–5.1)
Sodium: 137 mmol/L (ref 135–145)

## 2014-05-02 LAB — MAGNESIUM: Magnesium: 1.8 mg/dL (ref 1.5–2.5)

## 2014-05-02 MED ORDER — DILTIAZEM HCL ER COATED BEADS 240 MG PO CP24: 240.0000 mg | ORAL_CAPSULE | Freq: Every day | ORAL | Status: DC

## 2014-05-02 MED ORDER — WARFARIN SODIUM 5 MG PO TABS: 2.5000 mg | ORAL_TABLET | Freq: Every day | ORAL | Status: DC

## 2014-05-02 MED ORDER — WARFARIN SODIUM 2.5 MG PO TABS
2.5000 mg | ORAL_TABLET | Freq: Once | ORAL | Status: DC
  Filled 2014-05-02: qty 1

## 2014-05-02 MED ORDER — SODIUM CHLORIDE 0.9 % IV SOLN: INTRAVENOUS | Status: DC

## 2014-05-02 MED ORDER — POTASSIUM CHLORIDE 10 MEQ/100ML IV SOLN
10.0000 meq | INTRAVENOUS | Status: AC
  Administered 2014-05-02 (×2): 10 meq via INTRAVENOUS
  Filled 2014-05-02: qty 100

## 2014-05-02 MED ORDER — METOPROLOL SUCCINATE ER 50 MG PO TB24: 50.0000 mg | ORAL_TABLET | Freq: Every day | ORAL | Status: DC

## 2014-05-02 MED ORDER — FUROSEMIDE 40 MG PO TABS: 40.0000 mg | ORAL_TABLET | Freq: Every day | ORAL | Status: DC

## 2014-05-02 NOTE — Progress Notes (Signed)
Pt [name], wife at bedside, pt being d/c to home, tele d/c, iv x2 d/c site wnl, catheter intact after removal, site wnl. D/c instructions given to wife and pt to include meds, diet, activity, and fallow-up appointments.  Pt and wife verbalize understanding and agreement with dc instructions. Pt released to care of wife in no apparent distress, declined wheelchair, walked out with wife.

## 2014-05-02 NOTE — Progress Notes (Signed)
Admit: 04/29/2014 LOS: 3  Subjective:  No c/o, feels well Ready to go home No SOB Good UOP, stable GFR,   01/01 0701 - 01/02 0700 In: 8144 [P.O.:920; I.V.:33; IV Piggyback:100] Out: 2900 [Urine:2900]  Filed Weights   04/30/14 1300 05/01/14 0300 05/02/14 0427  Weight: 83.1 kg (183 lb 3.2 oz) 81.2 kg (179 lb 0.2 oz) 81.6 kg (179 lb 14.3 oz)    Scheduled Meds: . calcitRIOL  0.25 mcg Oral Daily  . cholecalciferol  1,000 Units Oral Daily  . diltiazem  240 mg Oral Daily  . metoprolol succinate  50 mg Oral Daily  . sodium bicarbonate  1,300 mg Oral BID  . sodium chloride  3 mL Intravenous Q12H  . warfarin  2.5 mg Oral ONCE-1800  . Warfarin - Pharmacist Dosing Inpatient   Does not apply q1800   Continuous Infusions: . sodium chloride 20 mL/hr at 05/02/14 0700   PRN Meds:.acetaminophen **OR** acetaminophen, ondansetron **OR** ondansetron (ZOFRAN) IV, senna-docusate  Current Labs: reviewed    Physical Exam:  Blood pressure 125/81, pulse 81, temperature 98.1 F (36.7 C), temperature source Oral, resp. rate 18, height 5\' 8"  (1.727 m), weight 81.6 kg (179 lb 14.3 oz), SpO2 96 %. NAD, appears well CTAB IRIR, +HSM 3/6 No LEE No rashes/lesions  A/P 1. AKI/CKD- likely due to A Fib with RVR, improving and stable 2. 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 Follow up with Dr. Jimmy Footman as an outpt -- will arrange 2. AVF appears to be maturing well 3. Metabolic acidosis- continue with po bicarb and follow 4. A fib with RVR- no rate controlled, plan per cardiology 5. Aortic stenosis- stable 1. pulm edema- cont on PO lasix  6. Bladder cancer- s/p cystectomy, cont with self-catheterization 7. Anemia of chronic disease- will follow at [place] 8. Dispo- ready for dc.  I will address f/u renal panel and MD apt at [facility] MD 05/02/2014, 11:38 AM   Recent Labs Lab 05/01/14 0315 05/01/14 1446 05/02/14 0030  NA 142 141 139  137  K 3.7 3.9 3.4*  3.4*   CL 111 106 104  104  CO2 13* 23 20  22   GLUCOSE 92 99 82  85  BUN 61* 58* 59*  60*  CREATININE 4.72* 4.41* 4.19*  4.20*  CALCIUM 9.0 8.5 8.3*  8.1*    Recent Labs Lab 04/30/14 0632 05/01/14 0315 05/02/14 0030  WBC 9.5 10.4 9.9  NEUTROABS  --  7.5 6.8  HGB 10.3* 11.7* 11.0*  HCT 31.7* 35.1* 33.1*  MCV 96.4 95.9 96.5  PLT 239 245 253

## 2014-05-02 NOTE — Progress Notes (Signed)
Patient Name: [name]      SUBJECTIVE admitted with sob in context of permanent AF ( hx of amio lung toxicity) and mild -moderate AS (mean grad 22--7/15///19 7/14))  CXR sugested CHF  Rx with IV diuresis (-7L)  Bun /Cr have remained stable  He also has CV CKD with recent placement of fistula   he feels great and is eager to go home Past Medical History  Diagnosis Date  . Paroxysmal atrial fibrillation     a. cardioversion 11/21/10 - Dr Radford Pax. b. 05/2013 - experiencing more SOB. 24-hr holter showed avg HR 49, max 82, min 38bpm. ETT showed baseline junctional bradycardia, HR incr only to 90bpm with drop in BP. Saw EP - amiodarone reduced and eventually d/c'd with improvement in dyspnea.  . Hypertension   . Warfarin anticoagulation   . CKD (chronic kidney disease), stage V     a. Followed by Dr. Jimmy Footman. b. IV fistula placed 04/17/14.  [name] Hypertrophic obstructive cardiomyopathy     a. Echo 10/2013 actually showed mild concentric LVH.  [name] Bladder cancer     a. 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  . Aortic valve stenosis     a. 10/2013 - mild to moderate stenosis by echo.  . Orthostatic hypotension   . Diastolic dysfunction   . Arthritis   . Self-catheterizes urinary bladder   . Junctional bradycardia   . NSVT (nonsustained ventricular tachycardia)     a. By holter 01/2014.    Scheduled Meds:  Scheduled Meds: . calcitRIOL  0.25 mcg Oral Daily  . cholecalciferol  1,000 Units Oral Daily  . diltiazem  240 mg Oral Daily  . metoprolol succinate  50 mg Oral Daily  . sodium bicarbonate  1,300 mg Oral BID  . sodium chloride  3 mL Intravenous Q12H  . warfarin  2.5 mg Oral ONCE-1800  . Warfarin - Pharmacist Dosing Inpatient   Does not apply q1800   Continuous Infusions: . sodium chloride 20 mL/hr at 05/02/14 0700   acetaminophen **OR** acetaminophen, ondansetron **OR** ondansetron (ZOFRAN) IV, senna-docusate  Well developed and nourished in  no acute distress HENT normal Neck supple with JVP-flat Clear Irregularly irregular rate and DZHGDJ,2/4 systolic murmur lops Abd-soft with active BS No Clubbing cyanosis edema Skin-warm and dry A & Oriented  Grossly normal sensory and motor function   TELEMETRY: Reviewed telemetry pt in afib   Intake/Output Summary (Last 24 hours) at 05/02/14 1022 Last data filed at 05/02/14 0900  Gross per 24 hour  Intake    933 ml  Output   3700 ml  Net  -2767 ml    LABS: Basic Metabolic Panel:  Recent Labs Lab 04/28/14 1700 04/29/14 1616 04/30/14 0632 05/01/14 0315 05/01/14 1446 05/02/14 0030  NA 139 141 141 142 141 139  137  K 4.6 4.5 4.3 3.7 3.9 3.4*  3.4*  CL 109 112 115* 111 106 104  104  CO2 17* 17* 13* 13* 23 20  22   GLUCOSE 113* 117* 88 92 99 82  85  BUN 69* 65* 63* 61* 58* 59*  60*  CREATININE 5.1* 5.24* 4.81* 4.72* 4.41* 4.19*  4.20*  CALCIUM 9.3 9.4 8.9 9.0 8.5 8.3*  8.1*  MG  --   --   --  2.0  --  1.8   Cardiac Enzymes:  Recent Labs  04/29/14 2020 04/30/14 0026 04/30/14 0632  TROPONINI 0.04* 0.03 0.04*   CBC:  Recent  Labs Lab 04/28/14 1700 04/29/14 1616 04/30/14 0632 05/01/14 0315 05/02/14 0030  WBC 9.8 9.7 9.5 10.4 9.9  NEUTROABS  --   --   --  7.5 PENDING  HGB 11.7* 11.6* 10.3* 11.7* 11.0*  HCT 35.7* 35.6* 31.7* 35.1* 33.1*  MCV 96.6 94.7 96.4 95.9 96.5  PLT 275.0 279 239 245 253   PROTIME:  Recent Labs  04/30/14 0632 05/01/14 0315 05/02/14 0030  LABPROT 25.6* 22.4* 24.9*  INR 2.32* 1.95* 2.24*   Liver Function Tests:  Recent Labs  05/01/14 0315 05/02/14 0030  AST 26 22  ALT 28 24  ALKPHOS 48 41  BILITOT 0.5 0.2*  PROT 6.9 6.1  ALBUMIN 3.6 3.3*   No results for input(s): LIPASE, AMYLASE in the last 72 hours. BNP: BNP (last 3 results)  Recent Labs  07/22/13 1440  PROBNP 580.0*   D-Dimer: No results for input(s): DDIMER in the last 72 hours. Hemoglobin A1C: No results for input(s): HGBA1C in the last 72  hours. Fasting Lipid Panel: No results for input(s): CHOL, HDL, LDLCALC, TRIG, CHOLHDL, LDLDIRECT in the last 72 hours. Thyroid Function Tests: No results for input(s): TSH, T4TOTAL, T3FREE, THYROIDAB in the last 72 hours.  Invalid input(s): FREET3  ASSESSMENT AND PLAN:  Active Problems:   Atrial fibrillation   Aortic stenosis, moderate   Dyspnea   Chronic kidney disease, stage V   Atrial fibrillation with RVR   Acute pulmonary edema   Pleural effusion   Metabolic acidosis  Ready for discharge continue rate control Lasix 40 po qd  Needs bmet with renal service within week   Signed, Virl Axe MD  05/02/2014

## 2014-05-02 NOTE — Progress Notes (Signed)
ANTICOAGULATION CONSULT NOTE - Follow Up Consult  Pharmacy Consult for Coumadin Indication: atrial fibrillation  Allergies  Allergen Reactions  . Amiodarone     Severely reduced DLCO  . Oxycodone Hcl Rash and Other (See Comments)    Rash and itching    Patient Measurements: Height: 5\' 8"  (172.7 cm) Weight: 179 lb 14.3 oz (81.6 kg) IBW/kg (Calculated) : 68.4  Vital Signs: Temp: 98.1 F (36.7 C) (01/02 0754) Temp Source: Oral (01/02 0754) BP: 125/81 mmHg (01/02 0755) Pulse Rate: 81 (01/02 0427)  Labs:  Recent Labs  04/29/14 2020 04/30/14 0026 04/30/14 0160 05/01/14 0315 05/01/14 1446 05/02/14 0030  HGB  --   --  10.3* 11.7*  --  11.0*  HCT  --   --  31.7* 35.1*  --  33.1*  PLT  --   --  239 245  --  253  LABPROT  --   --  25.6* 22.4*  --  24.9*  INR  --   --  2.32* 1.95*  --  2.24*  CREATININE  --   --  4.81* 4.72* 4.41* 4.19*  4.20*  TROPONINI 0.04* 0.03 0.04*  --   --   --     Estimated Creatinine Clearance: 14.9 mL/min (by C-G formula based on Cr of 4.2).  Assessment: 73yom continues on coumadin for afib. Coumadin was held for AV fistula placement on 12/18 but has since been resumed. INR is therapeutic today. CBC stable. No bleeding reported.  Home dose 2.5mg  daily  Goal of Therapy:  INR 2-3 Monitor platelets by anticoagulation protocol: Yes   Plan:  1) Coumadin 2.5 mg x 1 2) INR in AM  Uvaldo Rising, BCPS  Clinical Pharmacist Pager [phone]  05/02/2014 9:47 AM

## 2014-05-02 NOTE — Discharge Summary (Signed)
Physician Discharge Summary  [name] XLK:[id number] DOB: April 12, 1941 DOA: 04/29/2014  PCP: Pcp Not In System  Admit date: 04/29/2014 Discharge date: 05/02/2014  Time spent: 40 minutes  Recommendations for Outpatient Follow-up:  Atrial fibrillation with RVR - Discharge on Cardizem 240 mg daily. She will follow-up with cardiologist for further adjustments.  -Discharge on warfarin 2.5 mg daily, follow-up with Coumadin clinic for checks of his INR   Aortic stenosis moderate;  - stable, patient has a holosystolic murmur 4/6   Cardiomyopathy -See atrial fibrillation with RVR -See chronic kidney disease stage V -Continue metoprolol 50 mg daily -Continue Cardizem 240 mg daily  Sliding scale Lasix: Weigh yourself when you get home, then Daily in the Morning. Your dry weight will be what your scale says on the day you return home.(here is    lbs.).   If you gain more than 3 pounds from dry weight: Increase the Lasix dosing to 40 mg in the morning and 20 mg in the afternoon until weight returns to baseline dry weight.  If weight gain is greater than 5 pounds in 2 days: Increased to Lasix 40 mg twice a day and contact the cardiology office for further assistance if weight does not go down the next day.  If the weight goes down more than 3 pounds from dry weight: Hold Lasix until it returns to baseline dry weight  CKD stage V - not yet on dialysis but had AV fistula placed 12/18 -Strict in and out; since admission -7 L  -Daily a.m. Weight; admission weight= 81.2 kg, 1/2 weight standing= 81.6 kg;  -Discharge on Lasix 40 mg daily, cardiology/nephrology to adjust as needed -Patient will await himself today on his home scale, and use that as his dry weight  Metabolic acidosis -Continue sodium bicarbonate 1300 mg BID -Nephrology to adjust medication at follow-up   Pulmonary edema/bilateral pleural effusion -See CKD stage V   SOB -Resolved with rate control of A. fib and diuresis.   -See CKD stage V     Discharge Diagnoses:  Principal Problem:   Atrial fibrillation with RVR Active Problems:   Aortic stenosis, moderate   Acute on chronic diastolic congestive heart failure, NYHA class 3   Chronic kidney disease, stage V   Acute pulmonary edema   Pleural effusion   Metabolic acidosis   Discharge Condition: Stable  Diet recommendation:  Heart healthy  Filed Weights   04/30/14 1300 05/01/14 0300 05/02/14 0427  Weight: 83.1 kg (183 lb 3.2 oz) 81.2 kg (179 lb 0.2 oz) 81.6 kg (179 lb 14.3 oz)    History of present illness:  [name] is a 74 y.o. WM PMHx Chronic A fib with h/o DCCV and being on amio. Hypertrophic obstructive cardiomyopathy, aortic valve stenosis, chronic kidney disease stage V (self catheterizes) S/P AV fistula placement on 12/18. He was seen in the cardiology office on 12/29. He was seen for SOB that had developed over the last few days- both at rest and with exertion. In clinic he was found with a HR of 120s (fib). Cardiology advised him to go to the hospital. Patient declined. His amiodarone was changed to diltiazem at that time. His Cr was found to be elevated as well during the visit. No CP, No fever, no chills. Patient states he drove over 200 miles today to take care of business.   In the ER, he was seen by cardiology who started him on cardizem gtt and asked hospitalist to admit as patient has  CKD.  During hospitalization patient medication was adjusted to assure rate control of his atrial fibrillation, and patient was diuresed aggressively which resolved his pulmonary edema/SOB.    Consultants: Dr. Crissie Sickles (cardiology/EP)  Dr.Coladonato (nephrology)    Procedure/Significant Events: 7/20 echocardiogram;- Left ventricle: mild concentric hypertrophy.-LVEF= 55% to 60%.  -(grade 2 diastolic dysfunction). - Aortic valve: mild to moderate stenosis. -Left atrium: was severely dilated. - Right atrium: mildly  dilated.    Discharge Exam: Filed Vitals:   05/02/14 0754 05/02/14 0755 05/02/14 1210 05/02/14 1216  BP: 125/81 125/81 119/70 119/70  Pulse:      Temp: 98.1 F (36.7 C)   98.1 F (36.7 C)  TempSrc: Oral   Oral  Resp: 18   20  Height:      Weight:      SpO2: 96%   96%    General: A/O 4, NAD, negative respiratory distress Lungs: Clear to auscultation bilateral  Cardiovascular: irregular irregular rhythm and rate, systolic murmur grade 4/6, negative gallop or rub normal S1 and S2 Abdomen: Nontender, nondistended, soft, bowel sounds positive, no rebound, no ascites, no appreciable mass Extremities: No significant cyanosis, clubbing. Bilateral +1-2 pedal edema to knees  Discharge Instructions     Medication List    ASK your doctor about these medications        calcitRIOL 0.25 MCG capsule  Commonly known as:  ROCALTROL  Take 0.25 mcg by mouth daily. Alternate 1 or 2 capsules daily     CENTRUM SILVER ULTRA MENS PO  Take 1 tablet by mouth daily. Take daily     cholecalciferol 1000 UNITS tablet  Commonly known as:  VITAMIN D  Take 1,000 Units by mouth daily.     CINNAMON PO  Take 2,000 mg by mouth daily.     Cinnamon 500 MG capsule  Take 500 mg by mouth daily.     diltiazem 180 MG 24 hr capsule  Commonly known as:  CARDIZEM CD  Take 1 capsule (180 mg total) by mouth daily.     glucosamine-chondroitin 500-400 MG tablet  Take 1 tablet by mouth daily.     OSTEO BI-FLEX ADV TRIPLE ST PO  Take 1 tablet by mouth daily.     sodium bicarbonate 650 MG tablet  Take 1,300 mg by mouth 2 (two) times daily.     SUPER B COMPLEX PO  Take 1 capsule by mouth daily.     warfarin 5 MG tablet  Commonly known as:  COUMADIN  Take as directed by coumadin clinic       Allergies  Allergen Reactions  . Amiodarone     Severely reduced DLCO  . Oxycodone Hcl Rash and Other (See Comments)    Rash and itching      The results of significant diagnostics from this  hospitalization (including imaging, microbiology, ancillary and laboratory) are listed below for reference.    Significant Diagnostic Studies: Dg Chest 2 View  04/29/2014   CLINICAL DATA:  Chest pain.  Shortness of breath for 3 months.  EXAM: CHEST  2 VIEW  COMPARISON:  07/17/2013.  FINDINGS: Trachea is midline. Heart size stable. Mild diffuse interstitial prominence and indistinctness. Small bilateral effusions, left greater than right, with compressive atelectasis at the left lung base.  IMPRESSION: Favor pulmonary edema with small bilateral effusions, left greater than right.   Electronically Signed   By: Lorin Picket M.D.   On: 04/29/2014 16:51    Microbiology: Recent Results (from the past 240 hour(s))  MRSA PCR Screening     Status: None   Collection Time: 04/29/14  6:50 PM  Result Value Ref Range Status   MRSA by PCR NEGATIVE NEGATIVE Final    Comment:        The GeneXpert MRSA Assay (FDA approved for NASAL specimens only), is one component of a comprehensive MRSA colonization surveillance program. It is not intended to diagnose MRSA infection nor to guide or monitor treatment for MRSA infections.      Labs: Basic Metabolic Panel:  Recent Labs Lab 04/29/14 1616 04/30/14 0632 05/01/14 0315 05/01/14 1446 05/02/14 0030  NA 141 141 142 141 139  137  K 4.5 4.3 3.7 3.9 3.4*  3.4*  CL 112 115* 111 106 104  104  CO2 17* 13* 13* 23 20  22   GLUCOSE 117* 88 92 99 82  85  BUN 65* 63* 61* 58* 59*  60*  CREATININE 5.24* 4.81* 4.72* 4.41* 4.19*  4.20*  CALCIUM 9.4 8.9 9.0 8.5 8.3*  8.1*  MG  --   --  2.0  --  1.8   Liver Function Tests:  Recent Labs Lab 05/01/14 0315 05/02/14 0030  AST 26 22  ALT 28 24  ALKPHOS 48 41  BILITOT 0.5 0.2*  PROT 6.9 6.1  ALBUMIN 3.6 3.3*   No results for input(s): LIPASE, AMYLASE in the last 168 hours. No results for input(s): AMMONIA in the last 168 hours. CBC:  Recent Labs Lab 04/28/14 1700 04/29/14 1616  04/30/14 0632 05/01/14 0315 05/02/14 0030  WBC 9.8 9.7 9.5 10.4 9.9  NEUTROABS  --   --   --  7.5 6.8  HGB 11.7* 11.6* 10.3* 11.7* 11.0*  HCT 35.7* 35.6* 31.7* 35.1* 33.1*  MCV 96.6 94.7 96.4 95.9 96.5  PLT 275.0 279 239 245 253   Cardiac Enzymes:  Recent Labs Lab 04/29/14 2020 04/30/14 0026 04/30/14 0632  TROPONINI 0.04* 0.03 0.04*   BNP: BNP (last 3 results)  Recent Labs  07/22/13 1440  PROBNP 580.0*   CBG: No results for input(s): GLUCAP in the last 168 hours.     Signed:  Dia Crawford, MD [facility] [phone] pager

## 2014-05-11 ENCOUNTER — Ambulatory Visit (INDEPENDENT_AMBULATORY_CARE_PROVIDER_SITE_OTHER): Payer: MEDICARE

## 2014-05-11 DIAGNOSIS — I4891 Unspecified atrial fibrillation: Secondary | ICD-10-CM | POA: Diagnosis not present

## 2014-05-11 LAB — POCT INR: INR: 3.2

## 2014-05-19 ENCOUNTER — Telehealth: Payer: Self-pay | Admitting: Internal Medicine

## 2014-05-19 NOTE — Telephone Encounter (Signed)
New Msg         Pt calling would like to discuss some things, no details given.    Please return call.

## 2014-05-19 NOTE — Telephone Encounter (Signed)
Will forward to [facility] as he is followed by Dr Radford Pax

## 2014-05-19 NOTE — Telephone Encounter (Signed)
Patient c/o SOB on exertion. He said he was fine when he was discharged from the hospital on the 1st.  He st that for the last two weeks he has had bad cough and SOB when he moves.  He st he knows he is in Afib, but always is. Appointment made with Estella Husk 1/25 per patient request.

## 2014-05-20 ENCOUNTER — Inpatient Hospital Stay (HOSPITAL_COMMUNITY)
Admission: EM | Admit: 2014-05-20 | Discharge: 2014-05-22 | DRG: 308 | Disposition: A | Discharge status: Home / Self Care | Payer: MEDICARE | Attending: Internal Medicine | Admitting: Internal Medicine

## 2014-05-20 ENCOUNTER — Emergency Department (HOSPITAL_COMMUNITY): Payer: MEDICARE

## 2014-05-20 ENCOUNTER — Encounter (HOSPITAL_COMMUNITY): Payer: Self-pay | Admitting: Physical Medicine and Rehabilitation

## 2014-05-20 DIAGNOSIS — I10 Essential (primary) hypertension: Secondary | ICD-10-CM | POA: Diagnosis present

## 2014-05-20 DIAGNOSIS — I350 Nonrheumatic aortic (valve) stenosis: Secondary | ICD-10-CM | POA: Diagnosis present

## 2014-05-20 DIAGNOSIS — R040 Epistaxis: Secondary | ICD-10-CM | POA: Diagnosis present

## 2014-05-20 DIAGNOSIS — Z87891 Personal history of nicotine dependence: Secondary | ICD-10-CM | POA: Diagnosis not present

## 2014-05-20 DIAGNOSIS — I482 Chronic atrial fibrillation: Secondary | ICD-10-CM | POA: Diagnosis present

## 2014-05-20 DIAGNOSIS — I481 Persistent atrial fibrillation: Secondary | ICD-10-CM | POA: Diagnosis present

## 2014-05-20 DIAGNOSIS — I421 Obstructive hypertrophic cardiomyopathy: Secondary | ICD-10-CM | POA: Diagnosis present

## 2014-05-20 DIAGNOSIS — I5033 Acute on chronic diastolic (congestive) heart failure: Secondary | ICD-10-CM | POA: Diagnosis present

## 2014-05-20 DIAGNOSIS — J449 Chronic obstructive pulmonary disease, unspecified: Secondary | ICD-10-CM | POA: Diagnosis present

## 2014-05-20 DIAGNOSIS — I4891 Unspecified atrial fibrillation: Secondary | ICD-10-CM | POA: Diagnosis present

## 2014-05-20 DIAGNOSIS — J45909 Unspecified asthma, uncomplicated: Secondary | ICD-10-CM | POA: Diagnosis present

## 2014-05-20 DIAGNOSIS — I509 Heart failure, unspecified: Secondary | ICD-10-CM | POA: Diagnosis not present

## 2014-05-20 DIAGNOSIS — R739 Hyperglycemia, unspecified: Secondary | ICD-10-CM | POA: Diagnosis present

## 2014-05-20 DIAGNOSIS — Z961 Presence of intraocular lens: Secondary | ICD-10-CM | POA: Diagnosis present

## 2014-05-20 DIAGNOSIS — Z992 Dependence on renal dialysis: Secondary | ICD-10-CM

## 2014-05-20 DIAGNOSIS — D638 Anemia in other chronic diseases classified elsewhere: Secondary | ICD-10-CM | POA: Diagnosis present

## 2014-05-20 DIAGNOSIS — I120 Hypertensive chronic kidney disease with stage 5 chronic kidney disease or end stage renal disease: Secondary | ICD-10-CM | POA: Diagnosis present

## 2014-05-20 DIAGNOSIS — Z9841 Cataract extraction status, right eye: Secondary | ICD-10-CM

## 2014-05-20 DIAGNOSIS — I480 Paroxysmal atrial fibrillation: Principal | ICD-10-CM | POA: Diagnosis present

## 2014-05-20 DIAGNOSIS — Z888 Allergy status to other drugs, medicaments and biological substances status: Secondary | ICD-10-CM

## 2014-05-20 DIAGNOSIS — N185 Chronic kidney disease, stage 5: Secondary | ICD-10-CM | POA: Diagnosis present

## 2014-05-20 DIAGNOSIS — Z8551 Personal history of malignant neoplasm of bladder: Secondary | ICD-10-CM

## 2014-05-20 DIAGNOSIS — I519 Heart disease, unspecified: Secondary | ICD-10-CM | POA: Diagnosis not present

## 2014-05-20 DIAGNOSIS — M1990 Unspecified osteoarthritis, unspecified site: Secondary | ICD-10-CM | POA: Diagnosis present

## 2014-05-20 DIAGNOSIS — Z9842 Cataract extraction status, left eye: Secondary | ICD-10-CM

## 2014-05-20 DIAGNOSIS — R0602 Shortness of breath: Secondary | ICD-10-CM | POA: Diagnosis present

## 2014-05-20 DIAGNOSIS — Z9049 Acquired absence of other specified parts of digestive tract: Secondary | ICD-10-CM | POA: Diagnosis present

## 2014-05-20 DIAGNOSIS — I5031 Acute diastolic (congestive) heart failure: Secondary | ICD-10-CM | POA: Diagnosis not present

## 2014-05-20 DIAGNOSIS — E875 Hyperkalemia: Secondary | ICD-10-CM

## 2014-05-20 DIAGNOSIS — R0902 Hypoxemia: Secondary | ICD-10-CM | POA: Diagnosis present

## 2014-05-20 DIAGNOSIS — Z7901 Long term (current) use of anticoagulants: Secondary | ICD-10-CM | POA: Diagnosis not present

## 2014-05-20 DIAGNOSIS — R001 Bradycardia, unspecified: Secondary | ICD-10-CM | POA: Diagnosis present

## 2014-05-20 LAB — CBC WITH DIFFERENTIAL/PLATELET
Basophils Absolute: 0 10*3/uL (ref 0.0–0.1)
Basophils Relative: 0 % (ref 0–1)
Eosinophils Absolute: 0.2 10*3/uL (ref 0.0–0.7)
Eosinophils Relative: 3 % (ref 0–5)
HCT: 37.9 % — ABNORMAL LOW (ref 39.0–52.0)
Hemoglobin: 12.4 g/dL — ABNORMAL LOW (ref 13.0–17.0)
Lymphocytes Relative: 20 % (ref 12–46)
Lymphs Abs: 1.9 10*3/uL (ref 0.7–4.0)
MCH: 31.6 pg (ref 26.0–34.0)
MCHC: 32.7 g/dL (ref 30.0–36.0)
MCV: 96.4 fL (ref 78.0–100.0)
Monocytes Absolute: 0.7 10*3/uL (ref 0.1–1.0)
Monocytes Relative: 8 % (ref 3–12)
Neutro Abs: 6.7 10*3/uL (ref 1.7–7.7)
Neutrophils Relative %: 69 % (ref 43–77)
Platelets: 262 10*3/uL (ref 150–400)
RBC: 3.93 MIL/uL — ABNORMAL LOW (ref 4.22–5.81)
RDW: 16.4 % — ABNORMAL HIGH (ref 11.5–15.5)
WBC: 9.7 10*3/uL (ref 4.0–10.5)

## 2014-05-20 LAB — COMPREHENSIVE METABOLIC PANEL
ALT: 28 U/L (ref 0–53)
AST: 27 U/L (ref 0–37)
Albumin: 3.6 g/dL (ref 3.5–5.2)
Alkaline Phosphatase: 53 U/L (ref 39–117)
Anion gap: 11 (ref 5–15)
BUN: 69 mg/dL — ABNORMAL HIGH (ref 6–23)
CO2: 20 mmol/L (ref 19–32)
Calcium: 9.9 mg/dL (ref 8.4–10.5)
Chloride: 112 mEq/L (ref 96–112)
Creatinine, Ser: 4.51 mg/dL — ABNORMAL HIGH (ref 0.50–1.35)
GFR calc Af Amer: 14 mL/min — ABNORMAL LOW (ref >90)
GFR calc non Af Amer: 12 mL/min — ABNORMAL LOW (ref >90)
Glucose, Bld: 127 mg/dL — ABNORMAL HIGH (ref 70–99)
Potassium: 5.2 mmol/L — ABNORMAL HIGH (ref 3.5–5.1)
Sodium: 143 mmol/L (ref 135–145)
Total Bilirubin: 0.4 mg/dL (ref 0.3–1.2)
Total Protein: 6.6 g/dL (ref 6.0–8.3)

## 2014-05-20 LAB — BRAIN NATRIURETIC PEPTIDE: B Natriuretic Peptide: 3101.1 pg/mL — ABNORMAL HIGH (ref 0.0–100.0)

## 2014-05-20 LAB — I-STAT TROPONIN, ED: Troponin i, poc: 0.03 ng/mL (ref 0.00–0.08)

## 2014-05-20 LAB — PROTIME-INR
INR: 2.41 — AB (ref 0.00–1.49)
PROTHROMBIN TIME: 26.5 s — AB (ref 11.6–15.2)

## 2014-05-20 LAB — MRSA PCR SCREENING: MRSA by PCR: NEGATIVE

## 2014-05-20 MED ORDER — FUROSEMIDE 20 MG PO TABS: 40.0000 mg | ORAL_TABLET | Freq: Every day | ORAL | Status: DC

## 2014-05-20 MED ORDER — CALCITRIOL 0.25 MCG PO CAPS: 0.2500 ug | ORAL_CAPSULE | Freq: Every day | ORAL | Status: DC

## 2014-05-20 MED ORDER — METOPROLOL SUCCINATE ER 50 MG PO TB24: 50.0000 mg | ORAL_TABLET | Freq: Every day | ORAL | Status: DC

## 2014-05-20 MED ORDER — DILTIAZEM LOAD VIA INFUSION
20.0000 mg | Freq: Once | INTRAVENOUS | Status: AC
  Administered 2014-05-20: 20 mg via INTRAVENOUS
  Filled 2014-05-20: qty 20

## 2014-05-20 MED ORDER — VITAMIN D 1000 UNITS PO TABS: 1000.0000 [IU] | ORAL_TABLET | Freq: Every day | ORAL | Status: DC

## 2014-05-20 MED ORDER — FUROSEMIDE 40 MG PO TABS: 40.0000 mg | ORAL_TABLET | Freq: Every day | ORAL | Status: DC

## 2014-05-20 MED ORDER — METOPROLOL SUCCINATE ER 25 MG PO TB24: 50.0000 mg | ORAL_TABLET | Freq: Every day | ORAL | Status: DC

## 2014-05-20 MED ORDER — CALCITRIOL 0.25 MCG PO CAPS
0.2500 ug | ORAL_CAPSULE | Freq: Every day | ORAL | Status: DC
  Administered 2014-05-21 – 2014-05-22 (×2): 0.25 ug via ORAL
  Filled 2014-05-20 (×2): qty 1

## 2014-05-20 MED ORDER — FUROSEMIDE 10 MG/ML IJ SOLN
40.0000 mg | INTRAMUSCULAR | Status: AC
  Administered 2014-05-20: 40 mg via INTRAVENOUS
  Filled 2014-05-20: qty 4

## 2014-05-20 MED ORDER — DILTIAZEM HCL 100 MG IV SOLR
5.0000 mg/h | INTRAVENOUS | Status: DC
  Administered 2014-05-20 – 2014-05-21 (×3): 5 mg/h via INTRAVENOUS

## 2014-05-20 MED ORDER — DILTIAZEM HCL ER COATED BEADS 240 MG PO CP24
240.0000 mg | ORAL_CAPSULE | Freq: Every day | ORAL | Status: DC
  Administered 2014-05-21 – 2014-05-22 (×2): 240 mg via ORAL
  Filled 2014-05-20 (×2): qty 1

## 2014-05-20 MED ORDER — DILTIAZEM HCL ER COATED BEADS 240 MG PO CP24: 240.0000 mg | ORAL_CAPSULE | Freq: Every day | ORAL | Status: DC

## 2014-05-20 MED ORDER — SODIUM BICARBONATE 650 MG PO TABS
1300.0000 mg | ORAL_TABLET | Freq: Two times a day (BID) | ORAL | Status: DC
  Administered 2014-05-20 – 2014-05-21 (×2): 1300 mg via ORAL
  Filled 2014-05-20 (×3): qty 2

## 2014-05-20 MED ORDER — VITAMIN D 1000 UNITS PO TABS
1000.0000 [IU] | ORAL_TABLET | Freq: Every day | ORAL | Status: DC
  Administered 2014-05-21 – 2014-05-22 (×2): 1000 [IU] via ORAL
  Filled 2014-05-20 (×2): qty 1

## 2014-05-20 NOTE — ED Notes (Signed)
Attempted report 

## 2014-05-20 NOTE — Progress Notes (Signed)
Pt resting comfortably on 3lpm [place]. No respiratory distress noted. VS stable. C/d bilaterally.

## 2014-05-20 NOTE — ED Provider Notes (Signed)
CSN: [id number]     Arrival date & time 05/20/14  1616 History   First MD Initiated Contact with Patient 05/20/14 1644     Chief Complaint  Patient presents with  . Shortness of Breath     (Consider location/radiation/quality/duration/timing/severity/associated sxs/prior Treatment) HPI [name] is a 74 y.o. WM PMHx Chronic A fib with h/o DCCV and being on amio. Hypertrophic obstructive cardiomyopathy, aortic valve stenosis, chronic kidney disease stage V (self catheterizes) S/P AV fistula placement on 12/18. The patient was admitted on 12/30/2015for Afib with RVR. He was changed to metoprplol and cardizem . He states that he felt better for a few days after discharge on January 2nd, but has had progessively worsening DOE since that time (2 weeks) and now feels sob at rest. The patient was told to keep strict record of his weight at home and has had a 5 pound weight gain over the past week. He denies any fevers, chills, nausea, vomiting, chest pain, diaphoresis, PND, or orthopnea. He does c/o dry cough.   Past Medical History  Diagnosis Date  . Paroxysmal atrial fibrillation     a. cardioversion 11/21/10 - Dr Radford Pax. b. 05/2013 - experiencing more SOB. 24-hr holter showed avg HR 49, max 82, min 38bpm. ETT showed baseline junctional bradycardia, HR incr only to 90bpm with drop in BP. Saw EP - amiodarone reduced and eventually d/c'd with improvement in dyspnea.  . Hypertension   . Warfarin anticoagulation   . CKD (chronic kidney disease), stage V     a. Followed by Dr. Jimmy Footman. b. IV fistula placed 04/17/14.  [name] Hypertrophic obstructive cardiomyopathy     a. Echo 10/2013 actually showed mild concentric LVH.  [name] Bladder cancer     a. 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  . Aortic valve stenosis     a. 10/2013 - mild to moderate stenosis by echo.  . Orthostatic hypotension   . Diastolic dysfunction   . Arthritis   . Self-catheterizes urinary bladder   . Junctional  bradycardia   . NSVT (nonsustained ventricular tachycardia)     a. By holter 01/2014.   Past Surgical History  Procedure Laterality Date  . Cardioversion  11/21/2010  . Cystectomy    . Cholecystectomy    . Cataract extraction w/ intraocular lens  implant, bilateral    . Colonoscopy    . Av fistula placement Left 04/17/2014    Procedure: ARTERIOVENOUS (AV) FISTULA CREATION- LEFT UPPER ARM ;  Surgeon: Mal [name], MD;  Location: [facility] OR;  Service: Vascular;  Laterality: Left;   Family History  Problem Relation Age of Onset  . CVA Mother   . Heart attack Father   . Heart disease Father   . Arrhythmia Father   . Alzheimer's disease Sister   . Multiple sclerosis Brother   . Alcohol abuse Father    History  Substance Use Topics  . Smoking status: Former Smoker -- 2.00 packs/day for 33 years    Types: Cigarettes    Quit date: 05/02/1983  . Smokeless tobacco: Never Used  . Alcohol Use: No    Review of Systems Ten systems reviewed and are negative for acute change, except as noted in the HPI.     Allergies  Amiodarone and Oxycodone hcl  Home Medications   Prior to Admission medications   Medication Sig Start Date End Date Taking? Authorizing Provider  B Complex-C (SUPER B COMPLEX PO) Take 1 capsule by mouth daily.  Historical Provider, MD  calcitRIOL (ROCALTROL) 0.25 MCG capsule Take 0.25 mcg by mouth daily. Alternate 1 or 2 capsules daily    Historical Provider, MD  cholecalciferol (VITAMIN D) 1000 UNITS tablet Take 1,000 Units by mouth daily.    Historical Provider, MD  Cinnamon 500 MG capsule Take 500 mg by mouth daily.    Historical Provider, MD  diltiazem (CARDIZEM CD) 240 MG 24 hr capsule Take 1 capsule (240 mg total) by mouth daily. 05/02/14   Allie Bossier, MD  furosemide (LASIX) 40 MG tablet Take 1 tablet (40 mg total) by mouth daily. 05/02/14   Allie Bossier, MD  glucosamine-chondroitin 500-400 MG tablet Take 1 tablet by mouth daily.    Historical Provider, MD   metoprolol succinate (TOPROL-XL) 50 MG 24 hr tablet Take 1 tablet (50 mg total) by mouth daily. Take with or immediately following a meal. 05/02/14   Allie Bossier, MD  Misc Natural Products (OSTEO BI-FLEX ADV TRIPLE ST PO) Take 1 tablet by mouth daily.     Historical Provider, MD  Multiple Vitamins-Minerals (CENTRUM SILVER ULTRA MENS PO) Take 1 tablet by mouth daily. Take daily    Historical Provider, MD  sodium bicarbonate 650 MG tablet Take 1,300 mg by mouth 2 (two) times daily.     Historical Provider, MD  warfarin (COUMADIN) 5 MG tablet Take 0.5 tablets (2.5 mg total) by mouth daily. Take as directed by coumadin clinic 05/02/14   Allie Bossier, MD   BP 127/90 mmHg  Pulse 32  Temp(Src) 98.7 F (37.1 C) (Oral)  Resp 20  SpO2 92% Physical Exam  Constitutional: He is oriented to person, place, and time. He appears well-developed and well-nourished. No distress.  HENT:  Head: Normocephalic and atraumatic.  Eyes: Conjunctivae are normal. No scleral icterus.  Neck: Normal range of motion. Neck supple. JVD present.  Cardiovascular: Normal heart sounds.   Irregularly irregular rhythm, tachycardia  Pulmonary/Chest: Effort normal and breath sounds normal. No respiratory distress.  Tachypnea, shallow breathing, hypoxic at rest on room air. Vision placed on 2 L via nasal cannula.  Abdominal: Soft. There is no tenderness.  Musculoskeletal: He exhibits no edema.  Neurological: He is alert and oriented to person, place, and time.  Skin: Skin is warm and dry. He is not diaphoretic.  Psychiatric: His behavior is normal.  Nursing note and vitals reviewed.   ED Course  Procedures (including critical care time) Labs Review Labs Reviewed  CBC WITH DIFFERENTIAL - Abnormal; Notable for the following:    RBC 3.93 (*)    Hemoglobin 12.4 (*)    HCT 37.9 (*)    RDW 16.4 (*)    All other components within normal limits  COMPREHENSIVE METABOLIC PANEL - Abnormal; Notable for the following:     Potassium 5.2 (*)    Glucose, Bld 127 (*)    BUN 69 (*)    Creatinine, Ser 4.51 (*)    GFR calc non Af Amer 12 (*)    GFR calc Af Amer 14 (*)    All other components within normal limits  BRAIN NATRIURETIC PEPTIDE  PROTIME-INR  I-STAT TROPOININ, ED    Imaging Review No results found.   EKG Interpretation None        Date: 05/20/2014  Rate: 142  Rhythm: atrial fibrillation  QRS Axis: right  Intervals: normal  ST/T Wave abnormalities: normal  Conduction Disutrbances:none  Narrative Interpretation:   Old EKG Reviewed: Afib with RVR    MDM  Final diagnoses:  SOB (shortness of breath)    5:39 PM Patient here in A. fib with RVR. Lab work at started. He is hypoxic and I suspect that with this weight gain. There is an element of heart failure. I have ordered diltiazem. The patient is in RVR bouncing between 120 and 1 40 bpm. His EKG shows A. fib with RVR.   7:24 PM BP 146/84 mmHg  Pulse 90  Temp(Src) 98.7 F (37.1 C) (Oral)  Resp 20  SpO2 98% Patient now rate controlled after being given Cardizem. He is receiving IV Lasix 40 mg. His troponin is at 0.03, hemoglobin slightly low. However, patient has a history of anemia of chronic disease. His creatinine is at baseline. However, his potassium is at 5.2 which is elevated for him.he has hyperglycemia, his INR appears to be therapeutic. His proBNP is at 3000, up about 1800 from his last taken 3 weeks ago. Cardiology has consult on the patient in the ED and will consult inpatient as well. They requested a medical admission. I placed a consult to try at hospitalist service. Patient's PCP is  [facility]  7:35 PM BP 146/84 mmHg  Pulse 90  Temp(Src) 98.7 F (37.1 C) (Oral)  Resp 20  SpO2 98% Patient admitted to stepdown unit by Dr. Alcario Drought. Cardiology has consult on the patient. Pt stable in ED with no significant deterioration in condition. I personally reviewed the imaging tests through PACS system. I have reviewed  and interpreted Lab values. I reviewed available ER/hospitalization records through the EMR   Patient seen in shared visit with attending physician.  The patient appears reasonably stabilized for admission considering the current resources, flow, and capabilities available in the ED at this time, and I doubt any other [facility] requiring further screening and/or treatment in the ED prior to admission.   Margarita Mail, PA-C 05/20/14 1936  Virgel Manifold, MD 05/22/14 [phone]

## 2014-05-20 NOTE — H&P (Signed)
[facility] History and Physical  RAYMIE GIAMMARCO BMW:[id number] DOB: Dec 05, 1940 DOA: 05/20/2014  Referring physician: EDP PCP: Pcp Not In System   Chief Complaint: SOB   HPI: [name] is a 74 y.o. male with history of a.fib RVR, aortic valve stenosis, CKD stage 5 not yet started dialysis AV fistula placed 12/18.  Patient admitted on 04/29/14 for A.fib RVR, changed to metoprolol and cardizem.  Has felt better for a few days since discharge on Jan 2nd but then developed progressively worsening DOE for the past 2 weeks since that time.  Now SOB at rest.  Had 5 lb weight gain over past week.  Patient presents to ED with HR of 140, a.fib RVR.  Review of Systems: Systems reviewed.  As above, otherwise negative  Past Medical History  Diagnosis Date  . Paroxysmal atrial fibrillation     a. cardioversion 11/21/10 - Dr Radford Pax. b. 05/2013 - experiencing more SOB. 24-hr holter showed avg HR 49, max 82, min 38bpm. ETT showed baseline junctional bradycardia, HR incr only to 90bpm with drop in BP. Saw EP - amiodarone reduced and eventually d/c'd with improvement in dyspnea.  . Hypertension   . Warfarin anticoagulation   . CKD (chronic kidney disease), stage V     a. Followed by Dr. Jimmy Footman. b. IV fistula placed 04/17/14.  . Bladder cancer     a. 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  . Aortic valve stenosis     a. 10/2013 - mild to moderate stenosis by echo.  . Orthostatic hypotension   . Diastolic dysfunction   . Arthritis   . Self-catheterizes urinary bladder   . Junctional bradycardia   . NSVT (nonsustained ventricular tachycardia)     a. By holter 01/2014.   Past Surgical History  Procedure Laterality Date  . Cardioversion  11/21/2010  . Cystectomy    . Cholecystectomy    . Cataract extraction w/ intraocular lens  implant, bilateral    . Colonoscopy    . Av fistula placement Left 04/17/2014    Procedure: ARTERIOVENOUS (AV) FISTULA CREATION- LEFT UPPER  ARM ;  Surgeon: Mal [name], MD;  Location: [place];  Service: Vascular;  Laterality: Left;   Social History:  reports that he quit smoking about 31 years ago. His smoking use included Cigarettes. He has a 66 pack-year smoking history. He has never used smokeless tobacco. He reports that he does not drink alcohol or use illicit drugs.  Allergies  Allergen Reactions  . Amiodarone Other (See Comments)    Severely reduced DLCO  . Oxycodone Hcl Rash and Other (See Comments)    Rash and itching    Family History  Problem Relation Age of Onset  . CVA Mother   . Heart attack Father   . Heart disease Father   . Arrhythmia Father   . Alzheimer's disease Sister   . Multiple sclerosis Brother   . Alcohol abuse Father      Prior to Admission medications   Medication Sig Start Date End Date Taking? Authorizing Provider  B Complex-C (SUPER B COMPLEX PO) Take 1 capsule by mouth daily.    Yes Historical Provider, MD  bismuth subsalicylate (PEPTO BISMOL) 262 MG chewable tablet Chew 524 mg by mouth as needed for diarrhea or loose stools.   Yes Historical Provider, MD  calcitRIOL (ROCALTROL) 0.25 MCG capsule Take 0.25 mcg by mouth daily. Alternate 1 or 2 capsules daily   Yes Historical Provider, MD  cholecalciferol (  VITAMIN D) 1000 UNITS tablet Take 1,000 Units by mouth daily.   Yes Historical Provider, MD  Cinnamon 500 MG capsule Take 500 mg by mouth daily.   Yes Historical Provider, MD  diltiazem (CARDIZEM CD) 240 MG 24 hr capsule Take 1 capsule (240 mg total) by mouth daily. 05/02/14  Yes Allie Bossier, MD  furosemide (LASIX) 40 MG tablet Take 1 tablet (40 mg total) by mouth daily. 05/02/14  Yes Allie Bossier, MD  glucosamine-chondroitin 500-400 MG tablet Take 1 tablet by mouth daily.   Yes Historical Provider, MD  guaiFENesin-dextromethorphan (ROBITUSSIN DM) 100-10 MG/5ML syrup Take 15 mLs by mouth every 4 (four) hours as needed for cough.   Yes Historical Provider, MD  metoprolol succinate  (TOPROL-XL) 50 MG 24 hr tablet Take 1 tablet (50 mg total) by mouth daily. Take with or immediately following a meal. 05/02/14  Yes Allie Bossier, MD  Misc Natural Products (OSTEO BI-FLEX ADV TRIPLE ST PO) Take 1 tablet by mouth daily.    Yes Historical Provider, MD  Multiple Vitamins-Minerals (CENTRUM SILVER ULTRA MENS PO) Take 1 tablet by mouth daily. Take daily   Yes Historical Provider, MD  sodium bicarbonate 650 MG tablet Take 1,300 mg by mouth 2 (two) times daily.    Yes Historical Provider, MD  warfarin (COUMADIN) 5 MG tablet Take 0.5 tablets (2.5 mg total) by mouth daily. Take as directed by coumadin clinic Patient taking differently: Take 2.5 mg by mouth every morning. Take as directed by coumadin clinic 05/02/14  Yes Allie Bossier, MD   Physical Exam: Filed Vitals:   05/20/14 1930  BP: 145/79  Pulse: 101  Temp:   Resp: 19    BP 145/79 mmHg  Pulse 101  Temp(Src) 98.7 F (37.1 C) (Oral)  Resp 19  SpO2 87%  General Appearance:    Alert, oriented, no distress, appears stated age  Head:    Normocephalic, atraumatic  Eyes:    PERRL, EOMI, sclera non-icteric        Nose:   Nares without drainage or epistaxis. Mucosa, turbinates normal  Throat:   Moist mucous membranes. Oropharynx without erythema or exudate.  Neck:   Supple. No carotid bruits.  No thyromegaly.  No lymphadenopathy.   Back:     No CVA tenderness, no spinal tenderness  Lungs:     Clear to auscultation bilaterally, without wheezes, rhonchi or rales  Chest wall:    No tenderness to palpitation  Heart:    Irregularly irregular, 3/6 SEM with radiation to carotids.  Abdomen:     Soft, non-tender, nondistended, normal bowel sounds, no organomegaly  Genitalia:    deferred  Rectal:    deferred  Extremities:   No clubbing, cyanosis or edema.  Pulses:   2+ and symmetric all extremities  Skin:   Skin color, texture, turgor normal, no rashes or lesions  Lymph nodes:   Cervical, supraclavicular, and axillary nodes normal   Neurologic:   CNII-XII intact. Normal strength, sensation and reflexes      throughout    Labs on Admission:  Basic Metabolic Panel:  Recent Labs Lab 05/20/14 1632  NA 143  K 5.2*  CL 112  CO2 20  GLUCOSE 127*  BUN 69*  CREATININE 4.51*  CALCIUM 9.9   Liver Function Tests:  Recent Labs Lab 05/20/14 1632  AST 27  ALT 28  ALKPHOS 53  BILITOT 0.4  PROT 6.6  ALBUMIN 3.6   No results for input(s): LIPASE, AMYLASE in  the last 168 hours. No results for input(s): AMMONIA in the last 168 hours. CBC:  Recent Labs Lab 05/20/14 1632  WBC 9.7  NEUTROABS 6.7  HGB 12.4*  HCT 37.9*  MCV 96.4  PLT 262   Cardiac Enzymes: No results for input(s): CKTOTAL, CKMB, CKMBINDEX, TROPONINI in the last 168 hours.  BNP (last 3 results)  Recent Labs  07/22/13 1440  PROBNP 580.0*   CBG: No results for input(s): GLUCAP in the last 168 hours.  Radiological Exams on Admission: Dg Chest 2 View  05/20/2014   CLINICAL DATA:  Short of breath, cough for several weeks  EXAM: CHEST  2 VIEW  COMPARISON:  Radiograph 04/29/2014  FINDINGS: Normal cardiac silhouette. The bilateral pleural effusions which have improved on the left compared to prior. There is interstitial edema pattern similar prior. No focal consolidation.  IMPRESSION: 1. Persistent but improved bilateral pleural effusions. 2. Interstitial edema pattern.   Electronically Signed   By: Suzy Bouchard M.D.   On: 05/20/2014 18:00    EKG: Independently reviewed.  Assessment/Plan Active Problems:   Essential hypertension   Aortic stenosis, moderate   Diastolic dysfunction   Bradycardia   Chronic kidney disease, stage V   Atrial fibrillation with RVR   1. A.Fib RVR causing acute on chronic diastolic CHF - 1. Patients symptoms significantly improved with rate control and 40mg  IV lasix x1 2. Continue home lasix dose at 40mg  daily 3. On cardizem gtt at 5mg , transition back to 240mg  daily PO cardizem 4. Appreciate cards  consult 5. Continue coumadin 2. HTN - continue home meds 3. CKD stage 5 - chronic, baseline, and stable, checking BMP repeat tomorrow AM, slight elevation in potassium this evening at 5.2 but should improve after lasix dosing. 4.     Code Status: Full Code  Family Communication: Wife at bedside Disposition Plan: Admit to inpatient, SDU for cardizem gtt  Time spent: 16 min  GARDNER, JARED M. [facility] Pager [phone]  If 7AM-7PM, please contact the day team taking care of the patient [email or web address] Password [facility] 05/20/2014, 7:57 PM

## 2014-05-20 NOTE — ED Notes (Signed)
Pt states SOB x3 weeks. Increases on exertion. Respirations unlabored upon arrival to ED. Speaking complete sentences. Denies pain at present.

## 2014-05-20 NOTE — ED Notes (Signed)
Patient transported to X-ray 

## 2014-05-20 NOTE — Consult Note (Signed)
Reason for Consult: AFib RVR Primary Cardiologist: Dr. Fransico Him  HPI: [name] is an 74 y.o. male.with history of persistent atrial fibrillation, junctional bradycardia, HTN, reported prior HOCM (although last echo 10/2013 - mild LVH), orthostatic hypotension, HTN, mild-mod AS (by echo 10/2013).  He has chronic persistent atrial fibrillation and has been followed in our office by Dr. Lovena Le. He has a remote history of DCCV and was previously on amiodarone. He had a normal nuc in 2012. He was seen 05/2013 with increased DOE. A 24 Hour Holter monitor showed an average HR of 49bpm with max HR 82bpm and min HR 38bpm. He underwent ETT to assess chronotropic response to exercise which showed baseline junctional bradycardia and HR only increased to 90bpm with exercise with a drop in BP. He was seen by Dr. Lovena Le. His amio was decreased to 259m daily and ultimately stopped due to severely reduced DLCO. He had significant improvement in SOB after cessation. More recently he saw Dr. TLovena Le10/2015 for f/u of his AF as well as an episode of NSVT seen on another event monitor. His AF was rate controlled. Given his renal dysfunction Dr. TLovena Lerecommended rate control only. He felt that his comorbidities including near end-stage renal disease pending dialysis made him a very poor candidate for ICD implantation. The patient says he is not yet at the point where he requires dialysis and follows with Dr. DJimmy Footmanregularly, last seen 1.5 months ago. Creatinine in March 2015 was 4.5 and yesterday was 5.1. He underwent AV fistula placement 04/17/14.  His last 2-D echocardiogram was July 2015 and the ejection fraction was 55-60% with normal wall motion. Does have grade 2 diastolic dysfunction. His mild to moderate aortic stenosis. Mitral valve is severely calcified annulus mild regurgitation. Left atrium is severely dilated.   The patient was admitted on December 30 and discharged on January 2 after presenting  with trivial fibrillation with rapid ventricular response.  He was ultimately discharged with a plan for rate control using Cardizem 240 mg daily and metoprolol succinate 50 mg daily and Coumadin.  His discharge summary does not include the metoprolol however.  The patient presents back in A. fib RVR.  He reports dyspnea on exertion for the last 2 weeks. It started almost immediately after discharge from the hospital prior. He states when he starts to move he gets short of breath and loses his strength. He occasionally gets some chest pressure, which is mild when he lays down in bed. This does not occur with exertion.  He reports taking the Cardizem and metoprolol as directed.  It does not sound like he's very compliant about watching his sodium intake.  He currently denies nausea, vomiting, fever, orthopnea, dizziness, PND, abdominal pain, hematochezia, melena, lower extremity edema, claudication.  Past Medical History  Diagnosis Date  . Paroxysmal atrial fibrillation     a. cardioversion 11/21/10 - Dr TRadford Pax b. 05/2013 - experiencing more SOB. 24-hr holter showed avg HR 49, max 82, min 38bpm. ETT showed baseline junctional bradycardia, HR incr only to 90bpm with drop in BP. Saw EP - amiodarone reduced and eventually d/c'd with improvement in dyspnea.  . Hypertension   . Warfarin anticoagulation   . CKD (chronic kidney disease), stage V     a. Followed by Dr. DJimmy Footman b. IV fistula placed 04/17/14.  .Marland KitchenHypertrophic obstructive cardiomyopathy     a. Echo 10/2013 actually showed mild concentric LVH.  .Marland KitchenBladder cancer     a. s/p bladder  resection, surgery to recreate pouch from colon.  . Aortic valve stenosis     a. 10/2013 - mild to moderate stenosis by echo.  . Orthostatic hypotension   . Diastolic dysfunction   . Arthritis   . Self-catheterizes urinary bladder   . Junctional bradycardia   . NSVT (nonsustained ventricular tachycardia)     a. By holter 01/2014.    Past Surgical History    Procedure Laterality Date  . Cardioversion  11/21/2010  . Cystectomy    . Cholecystectomy    . Cataract extraction w/ intraocular lens  implant, bilateral    . Colonoscopy    . Av fistula placement Left 04/17/2014    Procedure: ARTERIOVENOUS (AV) FISTULA CREATION- LEFT UPPER ARM ;  Surgeon: Mal [name], MD;  Location: [facility] OR;  Service: Vascular;  Laterality: Left;    Family History  Problem Relation Age of Onset  . CVA Mother   . Heart attack Father   . Heart disease Father   . Arrhythmia Father   . Alzheimer's disease Sister   . Multiple sclerosis Brother   . Alcohol abuse Father     Social History:  reports that he quit smoking about 31 years ago. His smoking use included Cigarettes. He has a 66 pack-year smoking history. He has never used smokeless tobacco. He reports that he does not drink alcohol or use illicit drugs.  Allergies:  Allergies  Allergen Reactions  . Amiodarone Other (See Comments)    Severely reduced DLCO  . Oxycodone Hcl Rash and Other (See Comments)    Rash and itching    Medications:  Prior to Admission medications   Medication Sig Start Date End Date Taking? Authorizing Provider  B Complex-C (SUPER B COMPLEX PO) Take 1 capsule by mouth daily.    Yes Historical Provider, MD  bismuth subsalicylate (PEPTO BISMOL) 262 MG chewable tablet Chew 524 mg by mouth as needed for diarrhea or loose stools.   Yes Historical Provider, MD  calcitRIOL (ROCALTROL) 0.25 MCG capsule Take 0.25 mcg by mouth daily. Alternate 1 or 2 capsules daily   Yes Historical Provider, MD  cholecalciferol (VITAMIN D) 1000 UNITS tablet Take 1,000 Units by mouth daily.   Yes Historical Provider, MD  Cinnamon 500 MG capsule Take 500 mg by mouth daily.   Yes Historical Provider, MD  diltiazem (CARDIZEM CD) 240 MG 24 hr capsule Take 1 capsule (240 mg total) by mouth daily. 05/02/14  Yes Allie Bossier, MD  furosemide (LASIX) 40 MG tablet Take 1 tablet (40 mg total) by mouth daily. 05/02/14  Yes  Allie Bossier, MD  glucosamine-chondroitin 500-400 MG tablet Take 1 tablet by mouth daily.   Yes Historical Provider, MD  guaiFENesin-dextromethorphan (ROBITUSSIN DM) 100-10 MG/5ML syrup Take 15 mLs by mouth every 4 (four) hours as needed for cough.   Yes Historical Provider, MD  metoprolol succinate (TOPROL-XL) 50 MG 24 hr tablet Take 1 tablet (50 mg total) by mouth daily. Take with or immediately following a meal. 05/02/14  Yes Allie Bossier, MD  Misc Natural Products (OSTEO BI-FLEX ADV TRIPLE ST PO) Take 1 tablet by mouth daily.    Yes Historical Provider, MD  Multiple Vitamins-Minerals (CENTRUM SILVER ULTRA MENS PO) Take 1 tablet by mouth daily. Take daily   Yes Historical Provider, MD  sodium bicarbonate 650 MG tablet Take 1,300 mg by mouth 2 (two) times daily.    Yes Historical Provider, MD  warfarin (COUMADIN) 5 MG tablet Take 0.5 tablets (2.5  mg total) by mouth daily. Take as directed by coumadin clinic Patient taking differently: Take 2.5 mg by mouth every morning. Take as directed by coumadin clinic 05/02/14  Yes Allie Bossier, MD     Results for orders placed or performed during the hospital encounter of 05/20/14 (from the past 48 hour(s))  CBC with Differential     Status: Abnormal   Collection Time: 05/20/14  4:32 PM  Result Value Ref Range   WBC 9.7 4.0 - 10.5 K/uL   RBC 3.93 (L) 4.22 - 5.81 MIL/uL   Hemoglobin 12.4 (L) 13.0 - 17.0 g/dL   HCT 37.9 (L) 39.0 - 52.0 %   MCV 96.4 78.0 - 100.0 fL   MCH 31.6 26.0 - 34.0 pg   MCHC 32.7 30.0 - 36.0 g/dL   RDW 16.4 (H) 11.5 - 15.5 %   Platelets 262 150 - 400 K/uL   Neutrophils Relative % 69 43 - 77 %   Neutro Abs 6.7 1.7 - 7.7 K/uL   Lymphocytes Relative 20 12 - 46 %   Lymphs Abs 1.9 0.7 - 4.0 K/uL   Monocytes Relative 8 3 - 12 %   Monocytes Absolute 0.7 0.1 - 1.0 K/uL   Eosinophils Relative 3 0 - 5 %   Eosinophils Absolute 0.2 0.0 - 0.7 K/uL   Basophils Relative 0 0 - 1 %   Basophils Absolute 0.0 0.0 - 0.1 K/uL  Comprehensive  metabolic panel     Status: Abnormal   Collection Time: 05/20/14  4:32 PM  Result Value Ref Range   Sodium 143 135 - 145 mmol/L    Comment: Please note change in reference range.   Potassium 5.2 (H) 3.5 - 5.1 mmol/L    Comment: Please note change in reference range.   Chloride 112 96 - 112 mEq/L   CO2 20 19 - 32 mmol/L   Glucose, Bld 127 (H) 70 - 99 mg/dL   BUN 69 (H) 6 - 23 mg/dL   Creatinine, Ser 4.51 (H) 0.50 - 1.35 mg/dL   Calcium 9.9 8.4 - 10.5 mg/dL   Total Protein 6.6 6.0 - 8.3 g/dL   Albumin 3.6 3.5 - 5.2 g/dL   AST 27 0 - 37 U/L   ALT 28 0 - 53 U/L   Alkaline Phosphatase 53 39 - 117 U/L   Total Bilirubin 0.4 0.3 - 1.2 mg/dL   GFR calc non Af Amer 12 (L) >90 mL/min   GFR calc Af Amer 14 (L) >90 mL/min    Comment: (NOTE) The eGFR has been calculated using the CKD EPI equation. This calculation has not been validated in all clinical situations. eGFR's persistently <90 mL/min signify possible Chronic Kidney Disease.    Anion gap 11 5 - 15  Brain natriuretic peptide     Status: Abnormal   Collection Time: 05/20/14  4:32 PM  Result Value Ref Range   B Natriuretic Peptide 3101.1 (H) 0.0 - 100.0 pg/mL    Comment: Please note change in reference range.  Protime-INR     Status: Abnormal   Collection Time: 05/20/14  4:32 PM  Result Value Ref Range   Prothrombin Time 26.5 (H) 11.6 - 15.2 seconds   INR 2.41 (H) 0.00 - 1.49  I-Stat Troponin, ED (not at [facility])     Status: None   Collection Time: 05/20/14  4:44 PM  Result Value Ref Range   Troponin i, poc 0.03 0.00 - 0.08 ng/mL   Comment 3  Comment: Due to the release kinetics of cTnI, a negative result within the first hours of the onset of symptoms does not rule out myocardial infarction with certainty. If myocardial infarction is still suspected, repeat the test at appropriate intervals.     Dg Chest 2 View  05/20/2014   CLINICAL DATA:  Short of breath, cough for several weeks  EXAM: CHEST  2 VIEW  COMPARISON:   Radiograph 04/29/2014  FINDINGS: Normal cardiac silhouette. The bilateral pleural effusions which have improved on the left compared to prior. There is interstitial edema pattern similar prior. No focal consolidation.  IMPRESSION: 1. Persistent but improved bilateral pleural effusions. 2. Interstitial edema pattern.   Electronically Signed   By: Suzy Bouchard M.D.   On: 05/20/2014 18:00    Review of Systems  Constitutional: Negative for fever and diaphoresis.  HENT: Positive for congestion. Negative for sore throat.   Respiratory: Positive for cough and shortness of breath (with exertion).   Cardiovascular: Negative for chest pain, orthopnea, leg swelling and PND.  Gastrointestinal: Negative for nausea, vomiting, abdominal pain, blood in stool and melena.  Musculoskeletal: Negative for myalgias.  Neurological: Positive for weakness. Negative for dizziness.  All other systems reviewed and are negative.  Blood pressure 146/84, pulse 90, temperature 98.7 F (37.1 C), temperature source Oral, resp. rate 20, SpO2 98 %. Physical Exam  Nursing note and vitals reviewed. Constitutional: He is oriented to person, place, and time. He appears well-developed and well-nourished. No distress.  HENT:  Head: Normocephalic and atraumatic.  Mouth/Throat: No oropharyngeal exudate.  Eyes: EOM are normal. Pupils are equal, round, and reactive to light. No scleral icterus.  Neck: Normal range of motion. Neck supple.  Cardiovascular: Normal rate, S1 normal and S2 normal.  An irregularly irregular rhythm present.  Murmur heard.  Systolic murmur is present with a grade of 2/6  Pulses:      Radial pulses are 0 on the right side, and 0 on the left side.       Dorsalis pedis pulses are 1+ on the right side, and 1+ on the left side.  No carotid bruit  Respiratory: Effort normal. He has wheezes. He has no rales.  GI: Soft. Bowel sounds are normal. There is no tenderness.  Musculoskeletal: He exhibits no edema.    Lymphadenopathy:    He has no cervical adenopathy.  Neurological: He is alert and oriented to person, place, and time. He exhibits normal muscle tone.  Skin: Skin is warm.  Psychiatric: He has a normal mood and affect.    Assessment/Plan: Active Problems:   Atrial fibrillation with RVR   Essential hypertension   Aortic stenosis, moderate   Diastolic dysfunction   Bradycardia   Chronic kidney disease, stage V  Plan: Continue Cardizem 5 mg an hour which seems to be controlling his rate at this time. We did transition him back to Cardizem probably 240 mg daily and continue Toprol 50 mg. Depending on blood pressure, which is elevated at this time, will titrate as needed. He is wheezing on exam and will add Xopenex breathing treatment.  He appears euvolemic.  Recommended continuing home dose of Lasix  HAGER, BRYAN, PA-C 05/20/2014, 7:03 PM    Attending note:  Patient seen and examined. Reviewed records including recent hospitalization and cardiology consultation. Case discussed with Mr. Lucia Gaskins. Mr. Lipke has a history of persistent atrial fibrillation on chronic anticoagulation with Coumadin, and previously managed with amiodarone which was discontinued in favor of strategy of heart  rate control. He was recently hospitalized with rapid ventricular response on the hospitalist service (discharged January 2), discharge medications included Cardizem CD 240 mg daily and Toprol-XL 50 mg daily. Additional cardiac history includes mild to moderate aortic stenosis by echocardiogram in July 2015, previously documented junctional bradycardia, asymptomatic NSVT, and CKD stage V status post AV fistula placement ultimately to start hemodialysis. He presents back to the hospital stating that he has been short of breath with intermittent coughing over the last 2 weeks, also intermittent feelings of palpitations. He states that he has been taking his medications on a regular basis, has not missed any doses.  Denies any fevers or chills, no chest pain. Initially patient's heart rate was in the 140s on assessment in the ER, however has come down nicely into the 80s and 90s on 5 mg of diltiazem IV. He states that he took his medications regularly this morning. On examination he appears comfortable now, initially had some wheezing and shortness of breath. Lungs exhibit coarse breath sounds with somewhat prolonged expiratory phase and no rales, diminished at the bases, cardiac exam with irregularly irregular rhythm and 3/6 systolic murmur, no gallop. No significant peripheral edema noted. His chest x-ray reports bilateral but improved pleural effusions and interstitial edema pattern. INR is therapeutic at 2.4, BNP level 3101. Troponin I level is normal. Initial ECG showed rapid atrial fibrillation at 142 bpm with rightward axis and nonspecific ST changes.  Plan is for patient to be readmitted to the hospitalist team, we will follow in consultation as before. Intravenous Cardizem at low dose has been effective in slowing heart rate initially, would plan to transition back to oral regimen tomorrow morning. Would start back on Cardizem CD 240 mg daily and Toprol-XL 50 mg daily, uptitrate as needed. Not entirely clear why this regimen which had been effective before (heart rate in the 70s on the day of discharge), is no longer effective, presuming he is taking his medications as stated. He could also have some component of diastolic heart failure in light of increased heart rate. Would continue current diuretic dose, follow-up intake and output. At this point no clear indication for follow-up echocardiogram with recent study within the last 7 months. Doubt that his current degree of aortic stenosis in mild to moderate range is contributing to these symptoms.  Satira Sark, M.D., F.A.C.C.

## 2014-05-20 NOTE — Telephone Encounter (Signed)
Spoke with Melina Copa to see if patient could be seen today, per patient request. Dayna st she knows the patient and his medical history is complicated; and due to his kidney function and SOB he needs to go to the hospital, not come to the office.  Called patient and instructed him to go to the ER, as his kidney function is complicating his treatment as an outpatient.  Patient reluctantly agrees to go to ER this afternoon once his wife gets off work.

## 2014-05-20 NOTE — Progress Notes (Signed)
ANTICOAGULATION CONSULT NOTE - Initial Consult  Pharmacy Consult for Warfarin Indication: Atrial Fibrillation  Allergies  Allergen Reactions  . Amiodarone Other (See Comments)    Severely reduced DLCO  . Oxycodone Hcl Rash and Other (See Comments)    Rash and itching    Patient Measurements:   Vital Signs: Temp: 98.7 F (37.1 C) (01/20 1622) Temp Source: Oral (01/20 1622) BP: 146/84 mmHg (01/20 1900) Pulse Rate: 90 (01/20 1900)  Labs:  Recent Labs  05/20/14 1632  HGB 12.4*  HCT 37.9*  PLT 262  LABPROT 26.5*  INR 2.41*  CREATININE 4.51*    CrCl cannot be calculated (Unknown ideal weight.).   Medical History: Past Medical History  Diagnosis Date  . Paroxysmal atrial fibrillation     a. cardioversion 11/21/10 - Dr Radford Pax. b. 05/2013 - experiencing more SOB. 24-hr holter showed avg HR 49, max 82, min 38bpm. ETT showed baseline junctional bradycardia, HR incr only to 90bpm with drop in BP. Saw EP - amiodarone reduced and eventually d/c'd with improvement in dyspnea.  . Hypertension   . Warfarin anticoagulation   . CKD (chronic kidney disease), stage V     a. Followed by Dr. Jimmy Footman. b. IV fistula placed 04/17/14.  . Bladder cancer     a. 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  . Aortic valve stenosis     a. 10/2013 - mild to moderate stenosis by echo.  . Orthostatic hypotension   . Diastolic dysfunction   . Arthritis   . Self-catheterizes urinary bladder   . Junctional bradycardia   . NSVT (nonsustained ventricular tachycardia)     a. By holter 01/2014.    Medications:   (Not in a hospital admission) Scheduled:  . calcitRIOL  0.25 mcg Oral Daily  . cholecalciferol  1,000 Units Oral Daily  . diltiazem  240 mg Oral Daily  . furosemide  40 mg Oral Daily  . metoprolol succinate  50 mg Oral Daily  . sodium bicarbonate  1,300 mg Oral BID   Infusions:  . diltiazem (CARDIZEM) infusion 5 mg/hr (05/20/14 1803)    Assessment:  69 YOM  w/ PMH of persistent atrial fibrillation, junctional bradycardia, HTN, HOCM, orthostatic hypotension, and mild-mod AS.  PTA Warfarin 2.5 mg daily.  Pharmacy has been consulted to dose warfarin for patient's atrial fibrillation.      INR on admit therapeutic at 2.41.  Patient took a dose of warfarin at home today in the AM.    H/H slightly low, plt wnl.  No reports of bleeding at this time.    Goal of Therapy:  INR 2-3   Plan:  - Recommend continuing home dose warfarin at 2.5 mg daily.  Pharmacy will continue to follow.   - Daily INR - Monitor for signs and symptoms of bleeding  Hassie Bruce, Pharm. D. Clinical Pharmacy Resident Pager: [phone] Ph: [phone] 05/20/2014 7:47 PM

## 2014-05-20 NOTE — ED Notes (Signed)
Attempted report X2 

## 2014-05-21 DIAGNOSIS — I509 Heart failure, unspecified: Secondary | ICD-10-CM

## 2014-05-21 DIAGNOSIS — I5031 Acute diastolic (congestive) heart failure: Secondary | ICD-10-CM

## 2014-05-21 LAB — BASIC METABOLIC PANEL
Anion gap: 10 (ref 5–15)
BUN: 68 mg/dL — ABNORMAL HIGH (ref 6–23)
CALCIUM: 9.6 mg/dL (ref 8.4–10.5)
CO2: 19 mmol/L (ref 19–32)
Chloride: 112 mEq/L (ref 96–112)
Creatinine, Ser: 4.06 mg/dL — ABNORMAL HIGH (ref 0.50–1.35)
GFR calc non Af Amer: 13 mL/min — ABNORMAL LOW (ref >90)
GFR, EST AFRICAN AMERICAN: 15 mL/min — AB (ref >90)
Glucose, Bld: 91 mg/dL (ref 70–99)
Potassium: 3.7 mmol/L (ref 3.5–5.1)
Sodium: 141 mmol/L (ref 135–145)

## 2014-05-21 LAB — PROTIME-INR
INR: 2.43 — ABNORMAL HIGH (ref 0.00–1.49)
Prothrombin Time: 26.6 seconds — ABNORMAL HIGH (ref 11.6–15.2)

## 2014-05-21 MED ORDER — METOPROLOL SUCCINATE ER 100 MG PO TB24
100.0000 mg | ORAL_TABLET | Freq: Every day | ORAL | Status: DC
  Administered 2014-05-22: 100 mg via ORAL
  Filled 2014-05-21: qty 1

## 2014-05-21 MED ORDER — FUROSEMIDE 40 MG PO TABS
60.0000 mg | ORAL_TABLET | Freq: Every day | ORAL | Status: DC
  Administered 2014-05-22: 60 mg via ORAL
  Filled 2014-05-21 (×2): qty 1

## 2014-05-21 MED ORDER — WARFARIN SODIUM 2.5 MG PO TABS
2.5000 mg | ORAL_TABLET | Freq: Every day | ORAL | Status: DC
  Administered 2014-05-21: 2.5 mg via ORAL
  Filled 2014-05-21: qty 1

## 2014-05-21 MED ORDER — WARFARIN - PHARMACIST DOSING INPATIENT: Freq: Every day | Status: DC

## 2014-05-21 MED ORDER — METOPROLOL SUCCINATE ER 50 MG PO TB24
50.0000 mg | ORAL_TABLET | Freq: Two times a day (BID) | ORAL | Status: AC
  Administered 2014-05-21 (×2): 50 mg via ORAL
  Filled 2014-05-21 (×2): qty 1

## 2014-05-21 MED ORDER — FUROSEMIDE 10 MG/ML IJ SOLN
40.0000 mg | Freq: Once | INTRAMUSCULAR | Status: AC
  Administered 2014-05-21: 40 mg via INTRAVENOUS
  Filled 2014-05-21: qty 4

## 2014-05-21 NOTE — Progress Notes (Signed)
[facility] PROGRESS NOTE  CRAY MONNIN PPJ:[id number] DOB: [birth year] DOA: 05/20/2014 PCP: [name], MD  Assessment/Plan: 74 y/o male with PMH of PAF (on coumadin), HTN, CKD-V not on HD yet (s/p AVF on 03/2014), CHF, Aortic Stenosis presented with SOB, DOE;  -admitted with A fib RVR, CHF  1. Acute on chronic CHF, diastolic HF; echo (08/8097): LVEF: 55-60%, MS, MR, dilated VA, moderate AS, AVA-1.2; CHF likely worse in the setting of CKD-V, and valvular heart disease; -clinically improving on IV lasix; monitor I/O, daily weight; renal function; hold PO sodium   2. A fib RVR stable on IV Cardizem, medications (BB, cardizem) are being adjusted per cardiology, appreciate the input; cont coumadin   3. CKD-V not on HD,  (s/p AVF on 03/2014); cont monitor renal function on diuretics  4. HTN, cont BB, Cardizem, lasix; titrate as needed     Code Status: full Family Communication: d/w patient (indicate person spoken with, relationship, and if by phone, the number) Disposition Plan: home pend clinical improvement    Consultants:  Cardiology   Procedures:  none  Antibiotics:  none (indicate start date, and stop date if known)  HPI/Subjective: alert  Objective: Filed Vitals:   05/21/14 1108  BP: 151/87  Pulse: 89  Temp: 97.8 F (36.6 C)  Resp: 18    Intake/Output Summary (Last 24 hours) at 05/21/14 1211 Last data filed at 05/21/14 0925  Gross per 24 hour  Intake    360 ml  Output   3200 ml  Net  -2840 ml   Filed Weights   05/20/14 2110 05/21/14 0605  Weight: 85.684 kg (188 lb 14.4 oz) 82.101 kg (181 lb)    Exam:   General:  alert  Cardiovascular: I3,J8 systolic ejec mr  Respiratory: BL LL crackles  Abdomen: soft, nt,nd   Musculoskeletal: no edema    Data Reviewed: Basic Metabolic Panel:  Recent Labs Lab 05/20/14 1632 05/21/14 0400  NA 143 141  K 5.2* 3.7  CL 112 112  CO2 20 19  GLUCOSE 127* 91  BUN 69* 68*  CREATININE 4.51*  4.06*  CALCIUM 9.9 9.6   Liver Function Tests:  Recent Labs Lab 05/20/14 1632  AST 27  ALT 28  ALKPHOS 53  BILITOT 0.4  PROT 6.6  ALBUMIN 3.6   No results for input(s): LIPASE, AMYLASE in the last 168 hours. No results for input(s): AMMONIA in the last 168 hours. CBC:  Recent Labs Lab 05/20/14 1632  WBC 9.7  NEUTROABS 6.7  HGB 12.4*  HCT 37.9*  MCV 96.4  PLT 262   Cardiac Enzymes: No results for input(s): CKTOTAL, CKMB, CKMBINDEX, TROPONINI in the last 168 hours. BNP (last 3 results)  Recent Labs  07/22/13 1440  PROBNP 580.0*   CBG: No results for input(s): GLUCAP in the last 168 hours.  Recent Results (from the past 240 hour(s))  MRSA PCR Screening     Status: None   Collection Time: 05/20/14  8:58 PM  Result Value Ref Range Status   MRSA by PCR NEGATIVE NEGATIVE Final    Comment:        The GeneXpert MRSA Assay (FDA approved for NASAL specimens only), is one component of a comprehensive MRSA colonization surveillance program. It is not intended to diagnose MRSA infection nor to guide or monitor treatment for MRSA infections.      Studies: Dg Chest 2 View  05/20/2014   CLINICAL DATA:  Short of breath, cough for several weeks  EXAM: CHEST  2 VIEW  COMPARISON:  Radiograph 04/29/2014  FINDINGS: Normal cardiac silhouette. The bilateral pleural effusions which have improved on the left compared to prior. There is interstitial edema pattern similar prior. No focal consolidation.  IMPRESSION: 1. Persistent but improved bilateral pleural effusions. 2. Interstitial edema pattern.   Electronically Signed   By: Suzy Bouchard M.D.   On: 05/20/2014 18:00    Scheduled Meds: . calcitRIOL  0.25 mcg Oral Daily  . cholecalciferol  1,000 Units Oral Daily  . diltiazem  240 mg Oral Daily  . [START ON 05/22/2014] furosemide  60 mg Oral Daily  . [START ON 05/22/2014] metoprolol succinate  100 mg Oral Daily  . metoprolol succinate  50 mg Oral BID  . sodium bicarbonate   1,300 mg Oral BID   Continuous Infusions: . diltiazem (CARDIZEM) infusion 5 mg/hr (05/21/14 0600)    Active Problems:   Essential hypertension   Aortic stenosis, moderate   Diastolic dysfunction   Bradycardia   Chronic kidney disease, stage V   Atrial fibrillation with RVR    Time spent: >35 minutes     Kinnie Feil  [facility] Pager [phone]. If 7PM-7AM, please contact night-coverage at [email or web address], password [facility] 05/21/2014, 12:11 PM  LOS: 1 day

## 2014-05-21 NOTE — Progress Notes (Signed)
UR Completed Onedia Vargus Graves-Bigelow, RN,BSN [phone]  

## 2014-05-21 NOTE — Consult Note (Signed)
ELECTROPHYSIOLOGY CONSULT NOTE    Patient ID: [name] MRN: [id number], DOB/AGE: [birth year] 74 y.o.  Admit date: 05/20/2014 Date of Consult: 05/21/2014  Primary Physician: [name], MD Primary Cardiologist: Lovena Le  Reason for Consultation: AF with RVR  HPI:  [name] is a 74 y.o. male with a past medical history significant for persistent atrial fibrillation, junctional bradycardia, hypertension, orthostatic hypotension, mild-moderate AS (by echo 10/2013) who presented to the ER yesterday with increased shortness of breath.  He had a recent admission for diastolic heart failure and AF with RVR.  He was previously on amiodarone but this was discontinued due to severely reduced DLCO and rate control strategy was planned. On admission, he reported a recent 5 pound weight gain and increased shortness of breath and exercise intolerance.   He has been placed on IV Cardizem and given IV Lasix with some improvement in symptoms, but not yet at baseline. Last echo 10/2013 demonstrated EF 55-60%, no RWMA, grade 2 diastolic dysfunction, mild to moderate AS, LA 50.    He is anticoagulated with Warfarin (CHADS2VASC score is at least 2 - age and hypertension).   He has had occasional non-exertional chest pain as well as LE edema which is chronic for him.  He has also had nosebleeds and is concerned about his blood pressure being elevated.  He denies orthopnea, PND, fevers, chills, nausea or vomiting.  ROS is otherwise negative.  EP has been asked to assess for treatment options.   Past Medical History  Diagnosis Date  . Paroxysmal atrial fibrillation     a. cardioversion 11/21/10 - Dr Radford Pax. b. 05/2013 - experiencing more SOB. 24-hr holter showed avg HR 49, max 82, min 38bpm. ETT showed baseline junctional bradycardia, HR incr only to 90bpm with drop in BP. Saw EP - amiodarone reduced and eventually d/c'd with improvement in dyspnea.  . Hypertension   . Warfarin anticoagulation    . CKD (chronic kidney disease), stage V     a. Followed by Dr. Jimmy Footman. b. IV fistula placed 04/17/14.  . Bladder cancer     a. 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  . Aortic valve stenosis     a. 10/2013 - mild to moderate stenosis by echo.  . Orthostatic hypotension   . Diastolic dysfunction   . Arthritis   . Self-catheterizes urinary bladder   . Junctional bradycardia   . NSVT (nonsustained ventricular tachycardia)     a. By holter 01/2014.     Surgical History:  Past Surgical History  Procedure Laterality Date  . Cardioversion  11/21/2010  . Cystectomy    . Cholecystectomy    . Cataract extraction w/ intraocular lens  implant, bilateral    . Colonoscopy    . Av fistula placement Left 04/17/2014    Procedure: ARTERIOVENOUS (AV) FISTULA CREATION- LEFT UPPER ARM ;  Surgeon: Mal [name], MD;  Location: [facility];  Service: Vascular;  Laterality: Left;     Prescriptions prior to admission  Medication Sig Dispense Refill Last Dose  . B Complex-C (SUPER B COMPLEX PO) Take 1 capsule by mouth daily.    05/20/2014 at Unknown time  . bismuth subsalicylate (PEPTO BISMOL) 262 MG chewable tablet Chew 524 mg by mouth as needed for diarrhea or loose stools.   Past Month at Unknown time  . calcitRIOL (ROCALTROL) 0.25 MCG capsule Take 0.25 mcg by mouth daily. Alternate 1 or 2 capsules daily   05/20/2014 at Unknown time  .  cholecalciferol (VITAMIN D) 1000 UNITS tablet Take 1,000 Units by mouth daily.   05/20/2014 at Unknown time  . Cinnamon 500 MG capsule Take 500 mg by mouth daily.   05/20/2014 at Unknown time  . diltiazem (CARDIZEM CD) 240 MG 24 hr capsule Take 1 capsule (240 mg total) by mouth daily. 30 capsule 0 05/20/2014 at Unknown time  . furosemide (LASIX) 40 MG tablet Take 1 tablet (40 mg total) by mouth daily. 30 tablet 0 05/20/2014 at Unknown time  . glucosamine-chondroitin 500-400 MG tablet Take 1 tablet by mouth daily.   05/20/2014 at Unknown time  .  guaiFENesin-dextromethorphan (ROBITUSSIN DM) 100-10 MG/5ML syrup Take 15 mLs by mouth every 4 (four) hours as needed for cough.   Past Week at Unknown time  . metoprolol succinate (TOPROL-XL) 50 MG 24 hr tablet Take 1 tablet (50 mg total) by mouth daily. Take with or immediately following a meal. 30 tablet 0 05/20/2014 at 0900  . Misc Natural Products (OSTEO BI-FLEX ADV TRIPLE ST PO) Take 1 tablet by mouth daily.    05/20/2014 at Unknown time  . Multiple Vitamins-Minerals (CENTRUM SILVER ULTRA MENS PO) Take 1 tablet by mouth daily. Take daily   05/20/2014 at Unknown time  . sodium bicarbonate 650 MG tablet Take 1,300 mg by mouth 2 (two) times daily.    05/20/2014 at Unknown time  . warfarin (COUMADIN) 5 MG tablet Take 0.5 tablets (2.5 mg total) by mouth daily. Take as directed by coumadin clinic (Patient taking differently: Take 2.5 mg by mouth every morning. Take as directed by coumadin clinic) 90 tablet 1 05/20/2014 at Unknown time    Inpatient Medications:  . calcitRIOL  0.25 mcg Oral Daily  . cholecalciferol  1,000 Units Oral Daily  . diltiazem  240 mg Oral Daily  . furosemide  40 mg Oral Daily  . metoprolol succinate  50 mg Oral Daily  . sodium bicarbonate  1,300 mg Oral BID    Allergies:  Allergies  Allergen Reactions  . Amiodarone Other (See Comments)    Severely reduced DLCO  . Oxycodone Hcl Rash and Other (See Comments)    Rash and itching    History   Social History  . Marital Status: Married    Spouse Name: N/A    Number of Children: N/A  . Years of Education: N/A   Occupational History  . Not on file.   Social History Main Topics  . Smoking status: Former Smoker -- 2.00 packs/day for 33 years    Types: Cigarettes    Quit date: 05/02/1983  . Smokeless tobacco: Never Used  . Alcohol Use: No  . Drug Use: No  . Sexual Activity: Not on file   Other Topics Concern  . Not on file   Social History Narrative     Family History  Problem Relation Age of Onset  . CVA  Mother   . Heart attack Father   . Heart disease Father   . Arrhythmia Father   . Alzheimer's disease Sister   . Multiple sclerosis Brother   . Alcohol abuse Father     BP 124/90 mmHg  Pulse 91  Temp(Src) 97.8 F (36.6 C) (Oral)  Resp 22  Ht 5\' 8"  (1.727 m)  Wt 181 lb (82.101 kg)  BMI 27.53 kg/m2  SpO2 96%  Physical Exam: Well appearing 74 yo man, NAD HEENT: Unremarkable,[place], AT Neck:  8 JVD, no thyromegally Back:  No CVA tenderness Lungs:  Clear with no wheezes, rales, or  rhonchi HEART:  IRegular rate rhythm, 2/6 systolic murmurs, no rubs, no clicks Abd:  soft, positive bowel sounds, no organomegally, no rebound, no guarding Ext:  2 plus pulses, no edema, no cyanosis, no clubbing Skin:  No rashes no nodules Neuro:  CN II through XII intact, motor grossly intact   Labs:   Lab Results  Component Value Date   WBC 9.7 05/20/2014   HGB 12.4* 05/20/2014   HCT 37.9* 05/20/2014   MCV 96.4 05/20/2014   PLT 262 05/20/2014    Recent Labs Lab 05/20/14 1632 05/21/14 0400  NA 143 141  K 5.2* 3.7  CL 112 112  CO2 20 19  BUN 69* 68*  CREATININE 4.51* 4.06*  CALCIUM 9.9 9.6  PROT 6.6  --   BILITOT 0.4  --   ALKPHOS 53  --   ALT 28  --   AST 27  --   GLUCOSE 127* 91    Radiology/Studies: Dg Chest 2 View 05/20/2014   CLINICAL DATA:  Short of breath, cough for several weeks  EXAM: CHEST  2 VIEW  COMPARISON:  Radiograph 04/29/2014  FINDINGS: Normal cardiac silhouette. The bilateral pleural effusions which have improved on the left compared to prior. There is interstitial edema pattern similar prior. No focal consolidation.  IMPRESSION: 1. Persistent but improved bilateral pleural effusions. 2. Interstitial edema pattern.   Electronically Signed   By: Suzy Bouchard M.D.   On: 05/20/2014 18:00    EKG:AF, ventricular rate 142  TELEMETRY: AF with V rates 70-120's  A/P 1. Atrial fib with an RVR 2. Acute on chronic diastolic heart failure 3. Chronic stage 5 renal  failure 4. COPD Rec: his rate is under better control with IV cardizem. He has been on 240 of cardizem daily and metoprolol 50 mg daily. Will plan to uptitrate his metoprolol to 100 mg daily. He is improved with IV lasix. Will give another dose today and switch to oral tomorrow. He came in on 40 mg daily and will switch to 60 mg daily, following renal function carefully.   Mikle Bosworth.D.

## 2014-05-21 NOTE — Care Management Note (Addendum)
    Page 1 of 1   05/22/2014     1:07:41 PM CARE MANAGEMENT NOTE 05/22/2014  Patient:  [name], [name]   Account Number:  [id number]  Date Initiated:  05/21/2014  Documentation initiated by:  GRAVES-BIGELOW,Peggy Loge  Subjective/Objective Assessment:   Pt admitted for SOB and Afib RVR. Pt is from home with wife.     Action/Plan:   CM did discuss with pt the benefits from [facility] and pt felt like he would not need any HH services at this time. No further needs from CM.   Anticipated DC Date:  05/22/2014   Anticipated DC Plan:  [place]  CM consult      Choice offered to / List presented to:             Status of service:  Completed, signed off Medicare Important Message given?  YES (If response is "NO", the following Medicare IM given date fields will be blank) Date Medicare IM given:  05/22/2014 Medicare IM given by:  GRAVES-BIGELOW,Kolston [place] Date Additional Medicare IM given:   Additional Medicare IM given by:    Discharge Disposition:  HOME/SELF CARE  Per UR Regulation:  Reviewed for med. necessity/level of care/duration of stay  If discussed at [place] of Stay Meetings, dates discussed:    Comments:

## 2014-05-21 NOTE — Progress Notes (Signed)
ANTICOAGULATION CONSULT NOTE - Follow Up Consult  Pharmacy Consult for warfarin Indication: atrial fibrillation  Allergies  Allergen Reactions  . Amiodarone Other (See Comments)    Severely reduced DLCO  . Oxycodone Hcl Rash and Other (See Comments)    Rash and itching    Patient Measurements: Height: 5\' 8"  (172.7 cm) Weight: 181 lb (82.101 kg) IBW/kg (Calculated) : 68.4  Vital Signs: Temp: 97.8 F (36.6 C) (01/21 1108) Temp Source: Oral (01/21 1108) BP: 151/87 mmHg (01/21 1108) Pulse Rate: 89 (01/21 1108)  Labs:  Recent Labs  05/20/14 1632 05/21/14 0400  HGB 12.4*  --   HCT 37.9*  --   PLT 262  --   LABPROT 26.5* 26.6*  INR 2.41* 2.43*  CREATININE 4.51* 4.06*    Estimated Creatinine Clearance: 16.7 mL/min (by C-G formula based on Cr of 4.06).  Assessment: 74 y/o male with hx Afib on chronic warfarin admitted for SOB. INR is therapeutic at 2.43. No bleeding noted, Hb 12.4 and stable since Dec 2015, platelets are normal.  Goal of Therapy:  INR 2-3 Monitor platelets by anticoagulation protocol: Yes   Plan:  - Warfarin 2.5 mg PO qpm - INR daily, consider changing to 3x weekly if remains stable - Monitor for s/sx of bleeding  [facility], Pharm.D., BCPS Clinical Pharmacist Pager: [phone] 05/21/2014 1:31 PM

## 2014-05-22 ENCOUNTER — Other Ambulatory Visit: Payer: Self-pay | Admitting: Physician Assistant

## 2014-05-22 DIAGNOSIS — N189 Chronic kidney disease, unspecified: Secondary | ICD-10-CM

## 2014-05-22 LAB — BASIC METABOLIC PANEL
ANION GAP: 10 (ref 5–15)
BUN: 61 mg/dL — AB (ref 6–23)
CALCIUM: 9.2 mg/dL (ref 8.4–10.5)
CO2: 20 mmol/L (ref 19–32)
CREATININE: 3.92 mg/dL — AB (ref 0.50–1.35)
Chloride: 108 mEq/L (ref 96–112)
GFR calc Af Amer: 16 mL/min — ABNORMAL LOW (ref >90)
GFR, EST NON AFRICAN AMERICAN: 14 mL/min — AB (ref >90)
GLUCOSE: 91 mg/dL (ref 70–99)
Potassium: 4.2 mmol/L (ref 3.5–5.1)
Sodium: 138 mmol/L (ref 135–145)

## 2014-05-22 LAB — PROTIME-INR
INR: 2.36 — ABNORMAL HIGH (ref 0.00–1.49)
Prothrombin Time: 26.1 seconds — ABNORMAL HIGH (ref 11.6–15.2)

## 2014-05-22 MED ORDER — METOPROLOL SUCCINATE ER 50 MG PO TB24: 50.0000 mg | ORAL_TABLET | Freq: Every day | ORAL | Status: DC

## 2014-05-22 MED ORDER — GLUCOSAMINE-CHONDROITIN 500-400 MG PO TABS: 1.0000 | ORAL_TABLET | Freq: Every day | ORAL | Status: DC

## 2014-05-22 MED ORDER — WARFARIN SODIUM 5 MG PO TABS: 2.5000 mg | ORAL_TABLET | Freq: Every day | ORAL | Status: DC

## 2014-05-22 MED ORDER — BISMUTH SUBSALICYLATE 262 MG PO CHEW: 524.0000 mg | CHEWABLE_TABLET | ORAL | Status: DC | PRN

## 2014-05-22 MED ORDER — FUROSEMIDE 20 MG PO TABS: 60.0000 mg | ORAL_TABLET | Freq: Every day | ORAL | Status: DC

## 2014-05-22 MED ORDER — DILTIAZEM HCL ER COATED BEADS 240 MG PO CP24: 240.0000 mg | ORAL_CAPSULE | Freq: Every day | ORAL | Status: DC

## 2014-05-22 MED ORDER — CALCITRIOL 0.25 MCG PO CAPS: 0.2500 ug | ORAL_CAPSULE | Freq: Every day | ORAL | Status: DC

## 2014-05-22 NOTE — Clinical Documentation Improvement (Signed)
Presents with AF, Acute on Chronic Diastolic CHF, Hypertensive CKD5, history of COPD noted by Dr. Lovena Le.  Hypertension is an assumed link with CKD. There is no assumed link with Hypertensive Heart Disease.  Please clarify if the patient has: Htn Heart and HTN CKD5 with CHF             Htn CKD5 as documented with CHF             Cardiorenal Syndrome             Other Condition             Htn Heart disease ruled out    Patient's Hypertension is being treated with IV Lasix, Beta Blockers, IV Cardizem.  Thank You, Zoila Shutter ,RN Clinical Documentation Specialist:  [place] Information Management

## 2014-05-22 NOTE — Progress Notes (Signed)
ANTICOAGULATION CONSULT NOTE - Follow Up Consult  Pharmacy Consult for warfarin Indication: atrial fibrillation  Allergies  Allergen Reactions  . Amiodarone Other (See Comments)    Severely reduced DLCO  . Oxycodone Hcl Rash and Other (See Comments)    Rash and itching    Patient Measurements: Height: 5\' 8"  (172.7 cm) Weight: 181 lb 4.8 oz (82.237 kg) IBW/kg (Calculated) : 68.4  Vital Signs: Temp: 98 F (36.7 C) (01/22 1029) Temp Source: Oral (01/22 0800) BP: 143/82 mmHg (01/22 1029) Pulse Rate: 67 (01/22 1029)  Labs:  Recent Labs  05/20/14 1632 05/21/14 0400 05/22/14 0340  HGB 12.4*  --   --   HCT 37.9*  --   --   PLT 262  --   --   LABPROT 26.5* 26.6* 26.1*  INR 2.41* 2.43* 2.36*  CREATININE 4.51* 4.06* 3.92*    Estimated Creatinine Clearance: 17.3 mL/min (by C-G formula based on Cr of 3.92).  Assessment: 74 y/o male with hx Afib on chronic warfarin admitted for SOB. INR is therapeutic at 2.36. No bleeding noted, Hb 12.4 and stable since Dec 2015, platelets are normal.  Goal of Therapy:  INR 2-3 Monitor platelets by anticoagulation protocol: Yes   Plan:  - Warfarin 2.5 mg PO qpm - INR daily, consider changing to 3x weekly if remains stable - Monitor for s/sx of bleeding  [facility], Pharm.D., BCPS Clinical Pharmacist Pager: [phone] 05/22/2014 10:44 AM

## 2014-05-22 NOTE — Progress Notes (Addendum)
I have left a message on our office's scheduling voicemail requesting to add a BMET to his visit with Dr. Lovena Le on 06/03/14. Also left message to cancel the appt with Ermalinda Barrios PA-C on Monday 05/25/14 (previously scheduled from another time - does not need this appt). Patient states he was already aware.  Morgyn Marut PA-C

## 2014-05-22 NOTE — Progress Notes (Signed)
Patient ID: [name], male   DOB: Jun 04, 1940, 74 y.o.   MRN: [id number]    Patient Name: [name] Date of Encounter: 05/22/2014     Active Problems:   Essential hypertension   Aortic stenosis, moderate   Diastolic dysfunction   Bradycardia   Chronic kidney disease, stage V   Atrial fibrillation with RVR   Congestive heart disease    SUBJECTIVE  " I feel more calm today", denies chest pressure or sob.   CURRENT MEDS . calcitRIOL  0.25 mcg Oral Daily  . cholecalciferol  1,000 Units Oral Daily  . diltiazem  240 mg Oral Daily  . furosemide  60 mg Oral Daily  . metoprolol succinate  100 mg Oral Daily  . warfarin  2.5 mg Oral q1800  . Warfarin - Pharmacist Dosing Inpatient   Does not apply q1800    OBJECTIVE  Filed Vitals:   05/22/14 0200 05/22/14 0400 05/22/14 0800 05/22/14 1029  BP: 129/80 129/80 151/86 143/82  Pulse: 72 78 72 67  Temp: 97.5 F (36.4 C) 97.8 F (36.6 C) 97.7 F (36.5 C) 98 F (36.7 C)  TempSrc:   Oral   Resp: 12 13 16    Height:      Weight:  181 lb 4.8 oz (82.237 kg)    SpO2: 96% 99% 98% 95%    Intake/Output Summary (Last 24 hours) at 05/22/14 1100 Last data filed at 05/22/14 0830  Gross per 24 hour  Intake    600 ml  Output   3851 ml  Net  -3251 ml   Filed Weights   05/20/14 2110 05/21/14 0605 05/22/14 0400  Weight: 188 lb 14.4 oz (85.684 kg) 181 lb (82.101 kg) 181 lb 4.8 oz (82.237 kg)    PHYSICAL EXAM  General: Pleasant, NAD. Neuro: Alert and oriented X 3. Moves all extremities spontaneously. Psych: Normal affect. HEENT:  Normal  Neck: Supple without bruits or JVD. Lungs:  Resp regular and unlabored, CTA. Heart: IRRR, soft MR murmur. Abdomen: Soft, non-tender, non-distended, BS + x 4.  Extremities: No clubbing, cyanosis or edema. DP/PT/Radials 2+ and equal bilaterally.  Accessory Clinical Findings  CBC  Recent Labs  05/20/14 1632  WBC 9.7  NEUTROABS 6.7  HGB 12.4*  HCT 37.9*  MCV 96.4  PLT 409   Basic  Metabolic Panel  Recent Labs  05/21/14 0400 05/22/14 0340  NA 141 138  K 3.7 4.2  CL 112 108  CO2 19 20  GLUCOSE 91 91  BUN 68* 61*  CREATININE 4.06* 3.92*  CALCIUM 9.6 9.2   Liver Function Tests  Recent Labs  05/20/14 1632  AST 27  ALT 28  ALKPHOS 53  BILITOT 0.4  PROT 6.6  ALBUMIN 3.6   No results for input(s): LIPASE, AMYLASE in the last 72 hours. Cardiac Enzymes No results for input(s): CKTOTAL, CKMB, CKMBINDEX, TROPONINI in the last 72 hours. BNP Invalid input(s): POCBNP D-Dimer No results for input(s): DDIMER in the last 72 hours. Hemoglobin A1C No results for input(s): HGBA1C in the last 72 hours. Fasting Lipid Panel No results for input(s): CHOL, HDL, LDLCALC, TRIG, CHOLHDL, LDLDIRECT in the last 72 hours. Thyroid Function Tests No results for input(s): TSH, T4TOTAL, T3FREE, THYROIDAB in the last 72 hours.  Invalid input(s): FREET3  TELE  Atrial fib with a controlled Vr.  Radiology/Studies  Dg Chest 2 View  05/20/2014   CLINICAL DATA:  Short of breath, cough for several weeks  EXAM: CHEST  2 VIEW  COMPARISON:  Radiograph 04/29/2014  FINDINGS: Normal cardiac silhouette. The bilateral pleural effusions which have improved on the left compared to prior. There is interstitial edema pattern similar prior. No focal consolidation.  IMPRESSION: 1. Persistent but improved bilateral pleural effusions. 2. Interstitial edema pattern.   Electronically Signed   By: Suzy Bouchard M.D.   On: 05/20/2014 18:00   Dg Chest 2 View  04/29/2014   CLINICAL DATA:  Chest pain.  Shortness of breath for 3 months.  EXAM: CHEST  2 VIEW  COMPARISON:  07/17/2013.  FINDINGS: Trachea is midline. Heart size stable. Mild diffuse interstitial prominence and indistinctness. Small bilateral effusions, left greater than right, with compressive atelectasis at the left lung base.  IMPRESSION: Favor pulmonary edema with small bilateral effusions, left greater than right.   Electronically Signed    By: Lorin Picket M.D.   On: 04/29/2014 16:51    ASSESSMENT AND PLAN 1. Atrial fibrillation with a RVR, now controlled with treatment of heart failure and uptitration of AV nodal blocking drugs: I would like him on metoprolol 50 bid and cardizem 240 daily. 2. Acute on chronic diastolic heart failure - he is euvolemic. Ok to discharge home. Would DC on lasix 60 mg daily. 3. Chronic stage 5 renal failure - creatinine stable today.  4. Disposition - ok to discharge home from cardiology perspective. He has an appointment with me in a approx. 10 days on Feb. 2. I would like him to have a BMP on that visit.  Gregg Taylor,M.D.  05/22/2014 11:00 AM

## 2014-05-22 NOTE — Discharge Summary (Signed)
Physician Discharge Summary  [name] YBW:[id number] DOB: April 06, 1941 DOA: 05/20/2014  PCP: [name], MD  Admit date: 05/20/2014 Discharge date: 05/22/2014  Time spent: >35 minutes  Recommendations for Outpatient Follow-up:  F/u with cardiology 2/2 F/u with nephrology on 2/9 F/u with PCP as needed  Discharge Diagnoses:  Active Problems:   Essential hypertension   Aortic stenosis, moderate   Diastolic dysfunction   Bradycardia   Chronic kidney disease, stage V   Atrial fibrillation with RVR   Congestive heart disease   Discharge Condition: stable   Diet recommendation: low sodium   Filed Weights   05/20/14 2110 05/21/14 0605 05/22/14 0400  Weight: 85.684 kg (188 lb 14.4 oz) 82.101 kg (181 lb) 82.237 kg (181 lb 4.8 oz)    History of present illness:  74 y/o male with PMH of PAF (on coumadin), HTN, CKD-V not on HD yet (s/p AVF on 03/2014), CHF, Aortic Stenosis presented with SOB, DOE;  -admitted with A fib RVR, CHF   Hospital Course:  1. Acute on chronic CHF, diastolic HF; echo (10/6809): LVEF: 55-60%, MS, MR, dilated VA, moderate AS, AVA-1.2; CHF likely worse in the setting of CKD-V, and valvular heart disease; -clinically improved on IV lasix; hold PO sodium tablets; d/c on lasix 60 mg f/u with cards on 2/2 with BMP 2. A fib RVR (RVR resolved) -stable on (BB 50 BID, cardizem 240) per cardiology, appreciate the input; cont coumadin  3. CKD-V not on HD, (s/p AVF on 03/2014); cont monitor renal function on diuretics; f/u with nephrology on 2/9 4. HTN, cont BB, Cardizem, lasix  Procedures:  none (i.e. Studies not automatically included, echos, thoracentesis, etc; not x-rays)  Consultations:  Cardiology   Discharge Exam: Filed Vitals:   05/22/14 1029  BP: 143/82  Pulse: 67  Temp: 98 F (36.7 C)  Resp:     General: alert Cardiovascular: s1,s2 rrr Respiratory: CTA BL  Discharge Instructions  Discharge Instructions    Diet - low sodium  heart healthy    Complete by:  As directed      Discharge instructions    Complete by:  As directed   Please follow up with cardiology, nephrology as scheduled     Increase activity slowly    Complete by:  As directed             Medication List    STOP taking these medications        cholecalciferol 1000 UNITS tablet  Commonly known as:  VITAMIN D     sodium bicarbonate 650 MG tablet      TAKE these medications        bismuth subsalicylate 572 MG chewable tablet  Commonly known as:  PEPTO BISMOL  Chew 2 tablets (524 mg total) by mouth as needed for diarrhea or loose stools.     calcitRIOL 0.25 MCG capsule  Commonly known as:  ROCALTROL  Take 1 capsule (0.25 mcg total) by mouth daily. Alternate 1 or 2 capsules daily     CENTRUM SILVER ULTRA MENS PO  Take 1 tablet by mouth daily. Take daily     Cinnamon 500 MG capsule  Take 500 mg by mouth daily.     diltiazem 240 MG 24 hr capsule  Commonly known as:  CARDIZEM CD  Take 1 capsule (240 mg total) by mouth daily.     furosemide 20 MG tablet  Commonly known as:  LASIX  Take 3 tablets (60 mg total) by mouth daily.  glucosamine-chondroitin 500-400 MG tablet  Take 1 tablet by mouth daily.     guaiFENesin-dextromethorphan 100-10 MG/5ML syrup  Commonly known as:  ROBITUSSIN DM  Take 15 mLs by mouth every 4 (four) hours as needed for cough.     metoprolol succinate 50 MG 24 hr tablet  Commonly known as:  TOPROL-XL  Take 1 tablet (50 mg total) by mouth daily. Take with or immediately following a meal.     OSTEO BI-FLEX ADV TRIPLE ST PO  Take 1 tablet by mouth daily.     SUPER B COMPLEX PO  Take 1 capsule by mouth daily.     warfarin 5 MG tablet  Commonly known as:  COUMADIN  Take 0.5 tablets (2.5 mg total) by mouth daily. Take as directed by coumadin clinic       Allergies  Allergen Reactions  . Amiodarone Other (See Comments)    Severely reduced DLCO  . Oxycodone Hcl Rash and Other (See Comments)     Rash and itching       Follow-up Information    Follow up with [name], MD In 1 week.   Specialty:  Internal Medicine   Contact information:   [address] [place] [address] [phone]        The results of significant diagnostics from this hospitalization (including imaging, microbiology, ancillary and laboratory) are listed below for reference.    Significant Diagnostic Studies: Dg Chest 2 View  05/20/2014   CLINICAL DATA:  Short of breath, cough for several weeks  EXAM: CHEST  2 VIEW  COMPARISON:  Radiograph 04/29/2014  FINDINGS: Normal cardiac silhouette. The bilateral pleural effusions which have improved on the left compared to prior. There is interstitial edema pattern similar prior. No focal consolidation.  IMPRESSION: 1. Persistent but improved bilateral pleural effusions. 2. Interstitial edema pattern.   Electronically Signed   By: Suzy Bouchard M.D.   On: 05/20/2014 18:00   Dg Chest 2 View  04/29/2014   CLINICAL DATA:  Chest pain.  Shortness of breath for 3 months.  EXAM: CHEST  2 VIEW  COMPARISON:  07/17/2013.  FINDINGS: Trachea is midline. Heart size stable. Mild diffuse interstitial prominence and indistinctness. Small bilateral effusions, left greater than right, with compressive atelectasis at the left lung base.  IMPRESSION: Favor pulmonary edema with small bilateral effusions, left greater than right.   Electronically Signed   By: Lorin Picket M.D.   On: 04/29/2014 16:51    Microbiology: Recent Results (from the past 240 hour(s))  MRSA PCR Screening     Status: None   Collection Time: 05/20/14  8:58 PM  Result Value Ref Range Status   MRSA by PCR NEGATIVE NEGATIVE Final    Comment:        The GeneXpert MRSA Assay (FDA approved for NASAL specimens only), is one component of a comprehensive MRSA colonization surveillance program. It is not intended to diagnose MRSA infection nor to guide or monitor treatment for MRSA  infections.      Labs: Basic Metabolic Panel:  Recent Labs Lab 05/20/14 1632 05/21/14 0400 05/22/14 0340  NA 143 141 138  K 5.2* 3.7 4.2  CL 112 112 108  CO2 20 19 20   GLUCOSE 127* 91 91  BUN 69* 68* 61*  CREATININE 4.51* 4.06* 3.92*  CALCIUM 9.9 9.6 9.2   Liver Function Tests:  Recent Labs Lab 05/20/14 1632  AST 27  ALT 28  ALKPHOS 53  BILITOT 0.4  PROT 6.6  ALBUMIN 3.6   No results for input(s): LIPASE, AMYLASE in the last 168 hours. No results for input(s): AMMONIA in the last 168 hours. CBC:  Recent Labs Lab 05/20/14 1632  WBC 9.7  NEUTROABS 6.7  HGB 12.4*  HCT 37.9*  MCV 96.4  PLT 262   Cardiac Enzymes: No results for input(s): CKTOTAL, CKMB, CKMBINDEX, TROPONINI in the last 168 hours. BNP: BNP (last 3 results)  Recent Labs  07/22/13 1440  PROBNP 580.0*   CBG: No results for input(s): GLUCAP in the last 168 hours.     SignedKinnie Feil  [facility] 05/22/2014, 11:34 AM

## 2014-05-25 ENCOUNTER — Ambulatory Visit: Payer: MEDICARE | Admitting: Physician Assistant

## 2014-05-29 ENCOUNTER — Encounter: Payer: Self-pay | Admitting: Cardiology

## 2014-06-01 ENCOUNTER — Encounter: Payer: Self-pay | Admitting: Vascular Surgery

## 2014-06-01 ENCOUNTER — Ambulatory Visit: Payer: MEDICARE | Admitting: Physician Assistant

## 2014-06-02 ENCOUNTER — Ambulatory Visit (HOSPITAL_COMMUNITY)
Admission: RE | Admit: 2014-06-02 | Discharge: 2014-06-02 | Disposition: A | Discharge status: Home / Self Care | Payer: MEDICARE | Source: Ambulatory Visit | Attending: Vascular Surgery | Admitting: Vascular Surgery

## 2014-06-02 ENCOUNTER — Encounter: Payer: Self-pay | Admitting: Vascular Surgery

## 2014-06-02 ENCOUNTER — Ambulatory Visit (INDEPENDENT_AMBULATORY_CARE_PROVIDER_SITE_OTHER): Payer: Self-pay | Admitting: Vascular Surgery

## 2014-06-02 VITALS — BP 151/103 | HR 110 | Ht 68.0 in | Wt 185.0 lb

## 2014-06-02 DIAGNOSIS — N186 End stage renal disease: Secondary | ICD-10-CM | POA: Diagnosis present

## 2014-06-02 DIAGNOSIS — Z4931 Encounter for adequacy testing for hemodialysis: Secondary | ICD-10-CM | POA: Diagnosis not present

## 2014-06-02 NOTE — Progress Notes (Signed)
Subjective:     Patient ID: [name], male   DOB: [birth year], 74 y.o.   MRN: [id number]  HPI this 74 year old male returns for continued follow-up regarding his brachial-cephalic AV fistula created in the left upper extremity on 04/17/2014. Patient has stage IV chronic kidney disease but is not on hemodialysis. He denies any new pain or numbness in the left hand. Review of Systems     Objective:   Physical Exam BP 151/103 mmHg  Pulse 110  Ht 5\' 8"  (1.727 m)  Wt 185 lb (83.915 kg)  BMI 28.14 kg/m2  SpO2 100%  Gen. well-developed well-nourished male no apparent distress alert and oriented 3 Left upper extremity with well-healed antecubital incision and good pulse and palpable thrill in brachial-cephalic AV fistula. No side branches noted on physical exam. 2+ radial pulse palpable distally with well-perfused left hand.  Today I ordered a duplex scan of the fistula which reveals it to be widely patent with no evidence of significant side branches and good flow throughout     Assessment:     Nicely functioning left brachial-cephalic AV fistula in patient with chronic kidney disease stage IV    Plan:     Could use fistula 07/17/2014 if needed Return to see me on when necessary basis

## 2014-06-03 ENCOUNTER — Ambulatory Visit (INDEPENDENT_AMBULATORY_CARE_PROVIDER_SITE_OTHER): Payer: MEDICARE | Admitting: *Deleted

## 2014-06-03 ENCOUNTER — Ambulatory Visit (INDEPENDENT_AMBULATORY_CARE_PROVIDER_SITE_OTHER): Payer: MEDICARE | Admitting: Internal Medicine

## 2014-06-03 ENCOUNTER — Other Ambulatory Visit (INDEPENDENT_AMBULATORY_CARE_PROVIDER_SITE_OTHER): Payer: MEDICARE | Admitting: *Deleted

## 2014-06-03 ENCOUNTER — Encounter: Payer: Self-pay | Admitting: Internal Medicine

## 2014-06-03 VITALS — BP 130/90 | HR 79 | Ht 68.0 in | Wt 186.2 lb

## 2014-06-03 DIAGNOSIS — I4891 Unspecified atrial fibrillation: Secondary | ICD-10-CM

## 2014-06-03 DIAGNOSIS — I472 Ventricular tachycardia, unspecified: Secondary | ICD-10-CM

## 2014-06-03 DIAGNOSIS — N185 Chronic kidney disease, stage 5: Secondary | ICD-10-CM

## 2014-06-03 DIAGNOSIS — I10 Essential (primary) hypertension: Secondary | ICD-10-CM

## 2014-06-03 DIAGNOSIS — N189 Chronic kidney disease, unspecified: Secondary | ICD-10-CM

## 2014-06-03 DIAGNOSIS — I350 Nonrheumatic aortic (valve) stenosis: Secondary | ICD-10-CM

## 2014-06-03 DIAGNOSIS — I5033 Acute on chronic diastolic (congestive) heart failure: Secondary | ICD-10-CM

## 2014-06-03 DIAGNOSIS — I4729 Other ventricular tachycardia: Secondary | ICD-10-CM

## 2014-06-03 LAB — BASIC METABOLIC PANEL
BUN: 72 mg/dL — AB (ref 6–23)
CO2: 21 meq/L (ref 19–32)
CREATININE: 3.88 mg/dL — AB (ref 0.40–1.50)
Calcium: 9.3 mg/dL (ref 8.4–10.5)
Chloride: 112 mEq/L (ref 96–112)
GFR: 16.23 mL/min — ABNORMAL LOW (ref >60.00)
Glucose, Bld: 79 mg/dL (ref 70–99)
POTASSIUM: 4.4 meq/L (ref 3.5–5.1)
SODIUM: 140 meq/L (ref 135–145)

## 2014-06-03 LAB — POCT INR: INR: 2

## 2014-06-03 NOTE — Progress Notes (Signed)
HPI [name] returns today for followup of his atrial fibrillation. He is a very pleasant 74 year old man with a history of atrial fibrillation, paroxysmal, diastolic heart failure, moderate aortic stenosis, hypertension, and renal insufficiency, stage V. He was recently hospitalized with atrial fibrillation and a rapid ventricular response resulting in the development of congestive heart failure. His rate was controlled and he was diuresed. He returns today for follow-up. He feels better, but notes that his blood pressure remains elevated. No syncope. Minimal peripheral edema. Dyspnea is class II. Allergies  Allergen Reactions  . Amiodarone Other (See Comments)    Severely reduced DLCO  . Oxycodone Hcl Rash and Other (See Comments)    Rash and itching     Current Outpatient Prescriptions  Medication Sig Dispense Refill  . B Complex-C (SUPER B COMPLEX PO) Take 1 capsule by mouth daily.     [name] bismuth subsalicylate (PEPTO BISMOL) 262 MG chewable tablet Chew 2 tablets (524 mg total) by mouth as needed for diarrhea or loose stools. 30 tablet 0  . calcitRIOL (ROCALTROL) 0.25 MCG capsule Take 1 capsule (0.25 mcg total) by mouth daily. Alternate 1 or 2 capsules daily 60 capsule 1  . Cinnamon 500 MG capsule Take 500 mg by mouth daily.    [name] diltiazem (CARDIZEM CD) 240 MG 24 hr capsule Take 1 capsule (240 mg total) by mouth daily. 30 capsule 1  . furosemide (LASIX) 20 MG tablet Take 3 tablets (60 mg total) by mouth daily. 30 tablet 0  . glucosamine-chondroitin 500-400 MG tablet Take 1 tablet by mouth daily. 30 tablet 0  . guaiFENesin-dextromethorphan (ROBITUSSIN DM) 100-10 MG/5ML syrup Take 15 mLs by mouth every 4 (four) hours as needed for cough.    . metoprolol succinate (TOPROL-XL) 50 MG 24 hr tablet Take 1 tablet (50 mg total) by mouth daily. Take with or immediately following a meal. 60 tablet 0  . Misc Natural Products (OSTEO BI-FLEX ADV TRIPLE ST PO) Take 1 tablet by mouth daily.       . Multiple Vitamins-Minerals (CENTRUM SILVER ULTRA MENS PO) Take 1 tablet by mouth daily. Take daily    . warfarin (COUMADIN) 5 MG tablet Take 0.5 tablets (2.5 mg total) by mouth daily. Take as directed by coumadin clinic 90 tablet 1   No current facility-administered medications for this visit.     Past Medical History  Diagnosis Date  . Paroxysmal atrial fibrillation     a. cardioversion 11/21/10 - Dr [name]. b. 05/2013 - experiencing more SOB. 24-hr holter showed avg HR 49, max 82, min 38bpm. ETT showed baseline junctional bradycardia, HR incr only to 90bpm with drop in BP. Saw EP - amiodarone reduced and eventually d/c'd with improvement in dyspnea.  . Hypertension   . Warfarin anticoagulation   . CKD (chronic kidney disease), stage V     a. Followed by Dr. Jimmy [name]. b. IV fistula placed 04/17/14.  . Bladder cancer     a. 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  . Aortic valve stenosis     a. 10/2013 - mild to moderate stenosis by echo.  . Orthostatic hypotension   . Diastolic dysfunction   . Arthritis   . Self-catheterizes urinary bladder   . Junctional bradycardia   . NSVT (nonsustained ventricular tachycardia)     a. By holter 01/2014.    ROS:   All systems reviewed and negative except as noted in the HPI.   Past Surgical History  Procedure Laterality Date  . Cardioversion  11/21/2010  . Cystectomy    . Cholecystectomy    . Cataract extraction w/ intraocular lens  implant, bilateral    . Colonoscopy    . Av fistula placement Left 04/17/2014    Procedure: ARTERIOVENOUS (AV) FISTULA CREATION- LEFT UPPER ARM ;  Surgeon: [name] [name], MD;  Location: [facility] OR;  Service: Vascular;  Laterality: Left;     Family History  Problem Relation Age of Onset  . CVA Mother   . Heart attack Father   . Heart disease Father   . Arrhythmia Father   . Alzheimer's disease Sister   . Multiple sclerosis Brother   . Alcohol abuse Father      History   Social  History  . Marital Status: Married    Spouse Name: N/A    Number of Children: N/A  . Years of Education: N/A   Occupational History  . Not on file.   Social History Main Topics  . Smoking status: Former Smoker -- 2.00 packs/day for 33 years    Types: Cigarettes    Quit date: 05/02/1983  . Smokeless tobacco: Never Used  . Alcohol Use: No  . Drug Use: No  . Sexual Activity: Not on file   Other Topics Concern  . Not on file   Social History Narrative     There were no vitals taken for this visit.  Physical Exam:  Well appearing 74 year old man, NAD HEENT: Unremarkable Neck:  No JVD, no thyromegally Lymphatics:  No adenopathy Back:  No CVA tenderness Lungs:  Clear, with no wheezes, rales, or rhonchi. HEART:  IRegular rate rhythm, no murmurs, no rubs, no clicks Abd:  soft, positive bowel sounds, no organomegally, no rebound, no guarding Ext:  2 plus pulses, trace peripheral edema, no cyanosis, no clubbing Skin:  No rashes no nodules Neuro:  CN II through XII intact, motor grossly intact  EKG - atrial fibrillation with a controlled ventricular response   Assess/Plan:

## 2014-06-03 NOTE — Assessment & Plan Note (Signed)
His blood pressure is still elevated. We discussed the treatment options. We will increase his dose of Cardizem from 240 mg daily to 180 mg twice a day. Additional recommendations will be based on the response to therapy.

## 2014-06-03 NOTE — Patient Instructions (Signed)
Your physician has recommended you make the following change in your medication:  1) Stop cardizem (diltiazem) 240 mg once daily 2) Start cardizem (dilitazem) 180 mg twice daily  Call the office in a week or so and let us know how you are tolerating this dose and we will call in a new prescription for you at that time.   Your physician recommends that you schedule a follow-up appointment in: 3 months with Dr. Lovena Le.

## 2014-06-03 NOTE — Assessment & Plan Note (Signed)
His congestive heart failure symptoms are currently class II. He is encouraged to maintain a low-sodium diet and take his medications and weigh himself regularly.

## 2014-06-03 NOTE — Assessment & Plan Note (Signed)
He has had no change in his urine output. He will follow-up with his nephrologist.

## 2014-06-03 NOTE — Assessment & Plan Note (Signed)
His ventricular rate is well controlled. Hopefully we can even improve on this with a small up titration in his dose of calcium channel blocker.

## 2014-06-03 NOTE — Assessment & Plan Note (Signed)
He has had no recurrent ventricular arrhythmias. He will undergo watchful waiting.

## 2014-06-03 NOTE — Assessment & Plan Note (Signed)
His aortic stenosis is moderate. The first component of his second heart sound is still present. He will need repeat echo in 1-2 years.

## 2014-06-09 ENCOUNTER — Telehealth: Payer: Self-pay | Admitting: Internal Medicine

## 2014-06-09 ENCOUNTER — Inpatient Hospital Stay (HOSPITAL_COMMUNITY)
Admission: EM | Admit: 2014-06-09 | Discharge: 2014-06-13 | DRG: 292 | Disposition: A | Discharge status: Home / Self Care | Payer: MEDICARE | Attending: Internal Medicine | Admitting: Internal Medicine

## 2014-06-09 ENCOUNTER — Emergency Department (HOSPITAL_COMMUNITY): Payer: MEDICARE

## 2014-06-09 ENCOUNTER — Encounter (HOSPITAL_COMMUNITY): Payer: Self-pay | Admitting: *Deleted

## 2014-06-09 DIAGNOSIS — N185 Chronic kidney disease, stage 5: Secondary | ICD-10-CM | POA: Diagnosis present

## 2014-06-09 DIAGNOSIS — I120 Hypertensive chronic kidney disease with stage 5 chronic kidney disease or end stage renal disease: Secondary | ICD-10-CM | POA: Diagnosis present

## 2014-06-09 DIAGNOSIS — J449 Chronic obstructive pulmonary disease, unspecified: Secondary | ICD-10-CM | POA: Diagnosis present

## 2014-06-09 DIAGNOSIS — Z9841 Cataract extraction status, right eye: Secondary | ICD-10-CM | POA: Diagnosis not present

## 2014-06-09 DIAGNOSIS — R197 Diarrhea, unspecified: Secondary | ICD-10-CM | POA: Diagnosis present

## 2014-06-09 DIAGNOSIS — Z8551 Personal history of malignant neoplasm of bladder: Secondary | ICD-10-CM | POA: Diagnosis not present

## 2014-06-09 DIAGNOSIS — I5030 Unspecified diastolic (congestive) heart failure: Secondary | ICD-10-CM

## 2014-06-09 DIAGNOSIS — Z885 Allergy status to narcotic agent status: Secondary | ICD-10-CM | POA: Diagnosis not present

## 2014-06-09 DIAGNOSIS — M1990 Unspecified osteoarthritis, unspecified site: Secondary | ICD-10-CM | POA: Diagnosis present

## 2014-06-09 DIAGNOSIS — Z7901 Long term (current) use of anticoagulants: Secondary | ICD-10-CM | POA: Diagnosis not present

## 2014-06-09 DIAGNOSIS — I10 Essential (primary) hypertension: Secondary | ICD-10-CM | POA: Diagnosis not present

## 2014-06-09 DIAGNOSIS — Z961 Presence of intraocular lens: Secondary | ICD-10-CM | POA: Diagnosis present

## 2014-06-09 DIAGNOSIS — R0602 Shortness of breath: Secondary | ICD-10-CM | POA: Diagnosis not present

## 2014-06-09 DIAGNOSIS — E876 Hypokalemia: Secondary | ICD-10-CM | POA: Diagnosis not present

## 2014-06-09 DIAGNOSIS — Z9842 Cataract extraction status, left eye: Secondary | ICD-10-CM | POA: Diagnosis not present

## 2014-06-09 DIAGNOSIS — Z9049 Acquired absence of other specified parts of digestive tract: Secondary | ICD-10-CM | POA: Diagnosis present

## 2014-06-09 DIAGNOSIS — I350 Nonrheumatic aortic (valve) stenosis: Secondary | ICD-10-CM | POA: Diagnosis present

## 2014-06-09 DIAGNOSIS — I5033 Acute on chronic diastolic (congestive) heart failure: Principal | ICD-10-CM | POA: Diagnosis present

## 2014-06-09 DIAGNOSIS — I480 Paroxysmal atrial fibrillation: Secondary | ICD-10-CM | POA: Diagnosis present

## 2014-06-09 DIAGNOSIS — Z87891 Personal history of nicotine dependence: Secondary | ICD-10-CM

## 2014-06-09 DIAGNOSIS — N179 Acute kidney failure, unspecified: Secondary | ICD-10-CM | POA: Diagnosis present

## 2014-06-09 DIAGNOSIS — E872 Acidosis, unspecified: Secondary | ICD-10-CM | POA: Diagnosis present

## 2014-06-09 DIAGNOSIS — J849 Interstitial pulmonary disease, unspecified: Secondary | ICD-10-CM

## 2014-06-09 HISTORY — DX: Reserved for inherently not codable concepts without codable children: IMO0001

## 2014-06-09 LAB — DIFFERENTIAL
BASOS PCT: 0 % (ref 0–1)
Basophils Absolute: 0 10*3/uL (ref 0.0–0.1)
EOS ABS: 0.3 10*3/uL (ref 0.0–0.7)
Eosinophils Relative: 3 % (ref 0–5)
LYMPHS PCT: 16 % (ref 12–46)
Lymphs Abs: 1.7 10*3/uL (ref 0.7–4.0)
MONO ABS: 0.8 10*3/uL (ref 0.1–1.0)
MONOS PCT: 7 % (ref 3–12)
NEUTROS ABS: 7.8 10*3/uL — AB (ref 1.7–7.7)
Neutrophils Relative %: 74 % (ref 43–77)

## 2014-06-09 LAB — BASIC METABOLIC PANEL
ANION GAP: 11 (ref 5–15)
BUN: 91 mg/dL — ABNORMAL HIGH (ref 6–23)
CALCIUM: 8.9 mg/dL (ref 8.4–10.5)
CO2: 11 mmol/L — AB (ref 19–32)
Chloride: 117 mmol/L — ABNORMAL HIGH (ref 96–112)
Creatinine, Ser: 5.48 mg/dL — ABNORMAL HIGH (ref 0.50–1.35)
GFR calc non Af Amer: 9 mL/min — ABNORMAL LOW (ref >90)
GFR, EST AFRICAN AMERICAN: 11 mL/min — AB (ref >90)
Glucose, Bld: 102 mg/dL — ABNORMAL HIGH (ref 70–99)
Potassium: 4.1 mmol/L (ref 3.5–5.1)
SODIUM: 139 mmol/L (ref 135–145)

## 2014-06-09 LAB — PROTIME-INR
INR: 2.63 — AB (ref 0.00–1.49)
PROTHROMBIN TIME: 28.3 s — AB (ref 11.6–15.2)

## 2014-06-09 LAB — I-STAT TROPONIN, ED: Troponin i, poc: 0.03 ng/mL (ref 0.00–0.08)

## 2014-06-09 LAB — CBC
HCT: 35.3 % — ABNORMAL LOW (ref 39.0–52.0)
HEMOGLOBIN: 11.6 g/dL — AB (ref 13.0–17.0)
MCH: 31.2 pg (ref 26.0–34.0)
MCHC: 32.9 g/dL (ref 30.0–36.0)
MCV: 94.9 fL (ref 78.0–100.0)
Platelets: 214 10*3/uL (ref 150–400)
RBC: 3.72 MIL/uL — AB (ref 4.22–5.81)
RDW: 16.4 % — ABNORMAL HIGH (ref 11.5–15.5)
WBC: 10.6 10*3/uL — ABNORMAL HIGH (ref 4.0–10.5)

## 2014-06-09 LAB — BRAIN NATRIURETIC PEPTIDE: B Natriuretic Peptide: 2524.7 pg/mL — ABNORMAL HIGH (ref 0.0–100.0)

## 2014-06-09 MED ORDER — CALCITRIOL 0.5 MCG PO CAPS
0.5000 ug | ORAL_CAPSULE | ORAL | Status: DC
  Administered 2014-06-11 – 2014-06-13 (×2): 0.5 ug via ORAL
  Filled 2014-06-09 (×2): qty 1

## 2014-06-09 MED ORDER — SODIUM BICARBONATE 650 MG PO TABS
1300.0000 mg | ORAL_TABLET | Freq: Two times a day (BID) | ORAL | Status: DC
  Administered 2014-06-09: 1300 mg via ORAL
  Filled 2014-06-09 (×3): qty 2

## 2014-06-09 MED ORDER — BISMUTH SUBSALICYLATE 262 MG PO CHEW
524.0000 mg | CHEWABLE_TABLET | ORAL | Status: DC | PRN
  Filled 2014-06-09: qty 2

## 2014-06-09 MED ORDER — FUROSEMIDE 10 MG/ML IJ SOLN
60.0000 mg | Freq: Once | INTRAMUSCULAR | Status: AC
  Administered 2014-06-09: 60 mg via INTRAVENOUS
  Filled 2014-06-09: qty 6

## 2014-06-09 MED ORDER — ACETAMINOPHEN 650 MG RE SUPP: 650.0000 mg | Freq: Four times a day (QID) | RECTAL | Status: DC | PRN

## 2014-06-09 MED ORDER — METOPROLOL SUCCINATE ER 50 MG PO TB24
50.0000 mg | ORAL_TABLET | Freq: Two times a day (BID) | ORAL | Status: DC
  Administered 2014-06-09 – 2014-06-13 (×8): 50 mg via ORAL
  Filled 2014-06-09 (×10): qty 1

## 2014-06-09 MED ORDER — ONDANSETRON HCL 4 MG/2ML IJ SOLN: 4.0000 mg | Freq: Four times a day (QID) | INTRAMUSCULAR | Status: DC | PRN

## 2014-06-09 MED ORDER — ACETAMINOPHEN 325 MG PO TABS: 650.0000 mg | ORAL_TABLET | Freq: Four times a day (QID) | ORAL | Status: DC | PRN

## 2014-06-09 MED ORDER — ONDANSETRON HCL 4 MG PO TABS: 4.0000 mg | ORAL_TABLET | Freq: Four times a day (QID) | ORAL | Status: DC | PRN

## 2014-06-09 MED ORDER — CALCITRIOL 0.25 MCG PO CAPS
0.2500 ug | ORAL_CAPSULE | ORAL | Status: DC
  Administered 2014-06-10 – 2014-06-12 (×2): 0.25 ug via ORAL
  Filled 2014-06-09 (×3): qty 1

## 2014-06-09 MED ORDER — SODIUM CHLORIDE 0.9 % IJ SOLN
3.0000 mL | Freq: Two times a day (BID) | INTRAMUSCULAR | Status: DC
Start: 2014-06-09 — End: 2014-06-13
  Administered 2014-06-10 – 2014-06-13 (×7): 3 mL via INTRAVENOUS

## 2014-06-09 MED ORDER — DILTIAZEM HCL ER COATED BEADS 180 MG PO CP24
180.0000 mg | ORAL_CAPSULE | Freq: Two times a day (BID) | ORAL | Status: DC
  Administered 2014-06-09 – 2014-06-13 (×8): 180 mg via ORAL
  Filled 2014-06-09 (×11): qty 1

## 2014-06-09 NOTE — Telephone Encounter (Signed)
New Message      Patient needs a call back, he feels that the medication  he is taking is not working. Pt c/o medication issue:  1. Name of Medication: Carta/ Diltiazem 100mg  twice a day/   Metoprlol ER 50mg  tablets twice a day and Furosemide 20 mg tablet   2. How are you currently taking this medication (dosage and times per day)?Carta/ Diltiazem 100mg  twice a day/   Metoprlol ER 50mg  tablets twice a day and Furosemide 20 mg tablet   3. Are you having a reaction (difficulty breathing--STAT)? Yes   4. What is your medication issue?Heart and kidneys.   Please give a call back

## 2014-06-09 NOTE — ED Provider Notes (Signed)
CSN: [id number]     Arrival date & time 06/09/14  1631 History   First MD Initiated Contact with Patient 06/09/14 1831     Chief Complaint  Patient presents with  . Shortness of Breath     (Consider location/radiation/quality/duration/timing/severity/associated sxs/prior Treatment) HPI [name] is a 74 y.o. male with hx of afib, htn, CKD, CHF, aortic stenosis, presents to ED complaining of shortness of breath. Pt was hospitalized for the same about 3 wks ago. Was diagnosed with diastolic CHF, renal failure, diuresed. States he felt better "for just few days." In the last week, symptoms worsening. Denies orthopenea. States sleeps on 1 pillow. States unable to walk more than 10 steps without shortness of breath. States retaining fluid, reports 10lb weight gain in the last week. Pt was seen by PCP few days ago, increased cardizem and was taken off of bicarb. He was seen today by his nephrologist, and was told to come to ED due to ongoing sob.   Past Medical History  Diagnosis Date  . Paroxysmal atrial fibrillation     a. cardioversion 11/21/10 - Dr Radford Pax. b. 05/2013 - experiencing more SOB. 24-hr holter showed avg HR 49, max 82, min 38bpm. ETT showed baseline junctional bradycardia, HR incr only to 90bpm with drop in BP. Saw EP - amiodarone reduced and eventually d/c'd with improvement in dyspnea.  . Hypertension   . Warfarin anticoagulation   . CKD (chronic kidney disease), stage V     a. Followed by Dr. Jimmy Footman. b. IV fistula placed 04/17/14.  . Bladder cancer     a. 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  . Aortic valve stenosis     a. 10/2013 - mild to moderate stenosis by echo.  . Orthostatic hypotension   . Diastolic dysfunction   . Arthritis   . Self-catheterizes urinary bladder   . Junctional bradycardia   . NSVT (nonsustained ventricular tachycardia)     a. By holter 01/2014.   Past Surgical History  Procedure Laterality Date  . Cardioversion   11/21/2010  . Cystectomy    . Cholecystectomy    . Cataract extraction w/ intraocular lens  implant, bilateral    . Colonoscopy    . Av fistula placement Left 04/17/2014    Procedure: ARTERIOVENOUS (AV) FISTULA CREATION- LEFT UPPER ARM ;  Surgeon: Mal [name], MD;  Location: [facility] OR;  Service: Vascular;  Laterality: Left;   Family History  Problem Relation Age of Onset  . CVA Mother   . Heart attack Father   . Heart disease Father   . Arrhythmia Father   . Alzheimer's disease Sister   . Multiple sclerosis Brother   . Alcohol abuse Father    History  Substance Use Topics  . Smoking status: Former Smoker -- 2.00 packs/day for 33 years    Types: Cigarettes    Quit date: 05/02/1983  . Smokeless tobacco: Never Used  . Alcohol Use: No    Review of Systems  Constitutional: Negative for fever and chills.  Respiratory: Positive for chest tightness and shortness of breath. Negative for cough.   Cardiovascular: Positive for leg swelling. Negative for chest pain and palpitations.  Gastrointestinal: Negative for nausea, vomiting, abdominal pain, diarrhea and abdominal distention.  Genitourinary: Negative for dysuria, urgency, frequency and hematuria.  Musculoskeletal: Negative for myalgias, arthralgias, neck pain and neck stiffness.  Skin: Negative for rash.  Neurological: Negative for dizziness, weakness, light-headedness, numbness and headaches.  All other systems reviewed and  are negative.     Allergies  Amiodarone and Oxycodone hcl  Home Medications   Prior to Admission medications   Medication Sig Start Date End Date Taking? Authorizing Provider  B Complex-C (SUPER B COMPLEX PO) Take 1 capsule by mouth daily.    Yes Historical Provider, MD  bismuth subsalicylate (PEPTO BISMOL) 262 MG chewable tablet Chew 2 tablets (524 mg total) by mouth as needed for diarrhea or loose stools. 05/22/14  Yes Kinnie Feil, MD  calcitRIOL (ROCALTROL) 0.25 MCG capsule Take 1 capsule (0.25 mcg  total) by mouth daily. Alternate 1 or 2 capsules daily 05/22/14  Yes Kinnie Feil, MD  Cinnamon 500 MG capsule Take 250 mg by mouth daily.    Yes Historical Provider, MD  diltiazem (CARDIZEM CD) 180 MG 24 hr capsule Take one capsule by mouth twice daily 06/03/14  Yes Evans Lance, MD  furosemide (LASIX) 20 MG tablet Take 3 tablets (60 mg total) by mouth daily. Patient taking differently: Take 20 mg by mouth daily.  05/22/14  Yes Kinnie Feil, MD  guaiFENesin-dextromethorphan (ROBITUSSIN DM) 100-10 MG/5ML syrup Take 15 mLs by mouth every 4 (four) hours as needed for cough.   Yes Historical Provider, MD  metoprolol succinate (TOPROL-XL) 50 MG 24 hr tablet Take 1 tablet (50 mg total) by mouth daily. Take with or immediately following a meal. Patient taking differently: Take 50 mg by mouth 2 (two) times daily. Take with or immediately following a meal. 05/22/14  Yes Kinnie Feil, MD  Misc Natural Products (OSTEO BI-FLEX ADV TRIPLE ST PO) Take 1 tablet by mouth daily.    Yes Historical Provider, MD  Multiple Vitamins-Minerals (CENTRUM SILVER ULTRA MENS PO) Take 1 tablet by mouth daily. Take daily   Yes Historical Provider, MD  warfarin (COUMADIN) 5 MG tablet Take 0.5 tablets (2.5 mg total) by mouth daily. Take as directed by coumadin clinic 05/22/14  Yes Kinnie Feil, MD  glucosamine-chondroitin 500-400 MG tablet Take 1 tablet by mouth daily. Patient not taking: Reported on 06/09/2014 05/22/14   Kinnie Feil, MD   BP 162/84 mmHg  Pulse 75  Temp(Src) 98.6 F (37 C) (Oral)  Resp 16  SpO2 96% Physical Exam  Constitutional: He is oriented to person, place, and time. He appears well-developed and well-nourished. No distress.  HENT:  Head: Normocephalic and atraumatic.  Eyes: Conjunctivae are normal.  Neck: Neck supple.  Cardiovascular: Normal rate, regular rhythm and normal heart sounds.   Pulmonary/Chest: Effort normal. No respiratory distress. He has no wheezes. He has rales.  Rales  at bases  Abdominal: Soft. Bowel sounds are normal. He exhibits no distension. There is no tenderness. There is no rebound.  Musculoskeletal: He exhibits edema.  Trace edema in LE bilaterally  Neurological: He is alert and oriented to person, place, and time.  Skin: Skin is warm and dry.  Nursing note and vitals reviewed.   ED Course  Procedures (including critical care time) Labs Review Labs Reviewed  BRAIN NATRIURETIC PEPTIDE - Abnormal; Notable for the following:    B Natriuretic Peptide 2524.7 (*)    All other components within normal limits  CBC  BASIC METABOLIC PANEL  PROTIME-INR  DIFFERENTIAL  I-STAT TROPOININ, ED    Imaging Review Dg Chest 2 View  06/09/2014   CLINICAL DATA:  Shortness of breath for several weeks.  EXAM: CHEST  2 VIEW  COMPARISON:  May 20, 2014.  FINDINGS: Stable cardiomediastinal silhouette. Stable minimal bilateral pleural effusions are  noted. No pneumothorax is noted. Stable mild central pulmonary vascular congestion is noted. Probable mild bibasilar interstitial edema is noted. Bony thorax appears intact.  IMPRESSION: Stable cardiomegaly and probable mild bibasilar pulmonary edema. Stable minimal bilateral pleural effusions.   Electronically Signed   By: Marijo Conception, M.D.   On: 06/09/2014 19:23     EKG Interpretation   Date/Time:  Tuesday June 09 2014 16:35:43 EST Ventricular Rate:  75 PR Interval:    QRS Duration: 102 QT Interval:  398 QTC Calculation: 444 R Axis:   110 Text Interpretation:  Sinus tachycardia with A-V dissociation and  Accelerated Junctional rhythm Right axis deviation Incomplete right bundle  branch block Abnormal ECG no longer in atrial fibrillation.  Confirmed by  CAMPOS  MD, Lennette Bihari ([id number]) on 06/09/2014 7:20:52 PM      MDM   Final diagnoses:  Diastolic congestive heart failure, unspecified congestive heart failure chronicity   Pt with worsening SOB, mainly on excerption. Hx of CHF, recent admission for the  same. VS normal. p significantly sob after walking into the room. Oxygen 88s on RA. Pt feels better when resting. Will get labs, trop, ecg   8:23 PM Labs showing worsening renal function. CO2 11. CXR stable with possible mild bibasilar pulmonary edema. BNP elevated. Pt will need admission for further diuresis and nephrology consult. Spoke with Dr. Posey Pronto with [facility], will admit.   Vital signs stable at this time. Pt is comfortable in resting position. No respiratory distress. Lasix given 60mg  IV.   Filed Vitals:   06/09/14 1639 06/09/14 2001  BP: 162/84 152/73  Pulse: 75 75  Temp: 98.6 F (37 C)   TempSrc: Oral   Resp: 16 18  SpO2: 96% 100%       Renold Genta, PA-C 06/09/14 2029  Hoy Morn, MD 06/09/14 2035

## 2014-06-09 NOTE — Telephone Encounter (Signed)
Pt called c/o SOB that has been ongoing for a few weeks.  Pt states it has gotten worse over the past few days and his weight increased from 175 on Friday to 181 on Sunday. Pt has been taking ordered medications as prescribed and on Sunday took 2 Lasix in the morning and 1 in the evening and has continued since then doing so.  Pt has Stage V Kidney disease and has been putting out fluid in the form of loose stools, not urine.  Pt has been sleeping in the bed most of the night and then will move to the recliner for comfort.  Pt states that at rest he is fine but on exertion he can barely walk 20 ft before stopping to catch his breath.  Pt stated that this is how he felt when he was on Amiodarone and had difficulty breathing.  Pt unable to check BP as home machine is not working correctly.  Pt has no c/o of chest pain, pressure, or palpitations.   Pt concerns taken to DOD, Dr. Meda Coffee, on review she recommended pt go to the emergency room.  Pt instructed to go to the emergency room and asked if there was someone to go with him.  Pt stated he has an appointment at 3:15pm this afternoon with nephrologist that he does not want to miss, as he missed it last week and wants to know what is going on with kidneys.  Pt instructed he can be seen in the hospital and he stated he wants to see MD during scheduled appointment and he will go to the ED after that Dr. Visit.  Pt educated to call 911 or go to ED if the breathing problems get worse.

## 2014-06-09 NOTE — ED Notes (Signed)
Sob r/t to fluid retention. Just came from kidney dr. Not on dialysis. Taking lasix but not helping.

## 2014-06-09 NOTE — ED Notes (Signed)
Admit Doctor at bedside.  

## 2014-06-10 ENCOUNTER — Inpatient Hospital Stay (HOSPITAL_COMMUNITY): Payer: MEDICARE

## 2014-06-10 ENCOUNTER — Encounter (HOSPITAL_COMMUNITY): Payer: Self-pay | Admitting: General Practice

## 2014-06-10 DIAGNOSIS — R0602 Shortness of breath: Secondary | ICD-10-CM

## 2014-06-10 LAB — COMPREHENSIVE METABOLIC PANEL
ALBUMIN: 3.4 g/dL — AB (ref 3.5–5.2)
ALT: 48 U/L (ref 0–53)
ANION GAP: 11 (ref 5–15)
AST: 30 U/L (ref 0–37)
Alkaline Phosphatase: 47 U/L (ref 39–117)
BILIRUBIN TOTAL: 0.7 mg/dL (ref 0.3–1.2)
BUN: 85 mg/dL — AB (ref 6–23)
CHLORIDE: 116 mmol/L — AB (ref 96–112)
CO2: 12 mmol/L — ABNORMAL LOW (ref 19–32)
Calcium: 8.9 mg/dL (ref 8.4–10.5)
Creatinine, Ser: 5.14 mg/dL — ABNORMAL HIGH (ref 0.50–1.35)
GFR calc non Af Amer: 10 mL/min — ABNORMAL LOW (ref >90)
GFR, EST AFRICAN AMERICAN: 12 mL/min — AB (ref >90)
Glucose, Bld: 97 mg/dL (ref 70–99)
POTASSIUM: 3.7 mmol/L (ref 3.5–5.1)
Sodium: 139 mmol/L (ref 135–145)
Total Protein: 6 g/dL (ref 6.0–8.3)

## 2014-06-10 LAB — CBC WITH DIFFERENTIAL/PLATELET
BASOS PCT: 0 % (ref 0–1)
Basophils Absolute: 0 10*3/uL (ref 0.0–0.1)
Eosinophils Absolute: 0.4 10*3/uL (ref 0.0–0.7)
Eosinophils Relative: 4 % (ref 0–5)
HCT: 33.3 % — ABNORMAL LOW (ref 39.0–52.0)
Hemoglobin: 10.9 g/dL — ABNORMAL LOW (ref 13.0–17.0)
Lymphocytes Relative: 16 % (ref 12–46)
Lymphs Abs: 1.5 10*3/uL (ref 0.7–4.0)
MCH: 30.9 pg (ref 26.0–34.0)
MCHC: 32.7 g/dL (ref 30.0–36.0)
MCV: 94.3 fL (ref 78.0–100.0)
Monocytes Absolute: 0.8 10*3/uL (ref 0.1–1.0)
Monocytes Relative: 8 % (ref 3–12)
NEUTROS PCT: 72 % (ref 43–77)
Neutro Abs: 6.7 10*3/uL (ref 1.7–7.7)
PLATELETS: 202 10*3/uL (ref 150–400)
RBC: 3.53 MIL/uL — ABNORMAL LOW (ref 4.22–5.81)
RDW: 16.5 % — ABNORMAL HIGH (ref 11.5–15.5)
WBC: 9.4 10*3/uL (ref 4.0–10.5)

## 2014-06-10 LAB — BLOOD GAS, ARTERIAL
Acid-base deficit: 13.8 mmol/L — ABNORMAL HIGH (ref 0.0–2.0)
Bicarbonate: 11.6 mEq/L — ABNORMAL LOW (ref 20.0–24.0)
DRAWN BY: 283381
FIO2: 0.21 %
O2 SAT: 94.7 %
PH ART: 7.288 — AB (ref 7.350–7.450)
PO2 ART: 85.7 mmHg (ref 80.0–100.0)
Patient temperature: 98.6
TCO2: 12.3 mmol/L (ref 0–100)
pCO2 arterial: 25 mmHg — ABNORMAL LOW (ref 35.0–45.0)

## 2014-06-10 LAB — PROTIME-INR
INR: 2.88 — AB (ref 0.00–1.49)
Prothrombin Time: 30.4 seconds — ABNORMAL HIGH (ref 11.6–15.2)

## 2014-06-10 LAB — CLOSTRIDIUM DIFFICILE BY PCR: Toxigenic C. Difficile by PCR: NEGATIVE

## 2014-06-10 MED ORDER — WARFARIN - PHARMACIST DOSING INPATIENT: Freq: Every day | Status: DC

## 2014-06-10 MED ORDER — TECHNETIUM TC 99M DIETHYLENETRIAME-PENTAACETIC ACID
40.0000 | Freq: Once | INTRAVENOUS | Status: AC | PRN
Start: 2014-06-10 — End: 2014-06-10

## 2014-06-10 MED ORDER — TECHNETIUM TO 99M ALBUMIN AGGREGATED
6.0000 | Freq: Once | INTRAVENOUS | Status: AC | PRN
Start: 2014-06-10 — End: 2014-06-10
  Administered 2014-06-10: 6 via INTRAVENOUS

## 2014-06-10 MED ORDER — WARFARIN SODIUM 2.5 MG PO TABS
2.5000 mg | ORAL_TABLET | Freq: Every day | ORAL | Status: DC
  Administered 2014-06-10 – 2014-06-12 (×3): 2.5 mg via ORAL
  Filled 2014-06-10 (×5): qty 1

## 2014-06-10 MED ORDER — SODIUM BICARBONATE 650 MG PO TABS
1300.0000 mg | ORAL_TABLET | Freq: Three times a day (TID) | ORAL | Status: DC
  Administered 2014-06-10 – 2014-06-13 (×10): 1300 mg via ORAL
  Filled 2014-06-10 (×12): qty 2

## 2014-06-10 NOTE — Progress Notes (Signed)
Lower extremity venous duplex  Preliminary findings = Negative for DVT.   Landry Mellow, RDMS, RVT 06/10/2014

## 2014-06-10 NOTE — Progress Notes (Signed)
Reading for Coumadin Indication: atrial fibrillation  Allergies  Allergen Reactions  . Amiodarone Other (See Comments)    Severely reduced DLCO  . Oxycodone Hcl Rash and Other (See Comments)    Rash and itching    Patient Measurements: Height: 5\' 9"  (175.3 cm) Weight: 182 lb 5.2 oz (82.702 kg) (Scale B) IBW/kg (Calculated) : 70.7  Vital Signs: Temp: 97.6 F (36.4 C) (02/10 0446) Temp Source: Oral (02/10 0446) BP: 146/76 mmHg (02/10 1143) Pulse Rate: 77 (02/10 1143)  Labs:  Recent Labs  06/09/14 1641 06/09/14 1834 06/10/14 0310  HGB 11.6*  --  10.9*  HCT 35.3*  --  33.3*  PLT 214  --  202  LABPROT  --  28.3* 30.4*  INR  --  2.63* 2.88*  CREATININE 5.48*  --  5.14*    Estimated Creatinine Clearance: 12.6 mL/min (by C-G formula based on Cr of 5.14).   Assessment: 74 yo male admitted with SOB/CHF, h/o Afib, to continue Coumadin. Home coumadin dose 2.5 mg daily with last dose 06/09/14.  INR therapeutic at 2.88 and was therapeutic last night on admission at 2.63.   Last coumadin clinic INR therapeutic at 2.0 on 06/03/14.   Hg 10.9, pltc 202.  No bleeding reported.  To have VQ scan to r/o PE.   Goal of Therapy:  INR 2-3   Plan:  -coumadin 2.5 mg daily F/U daily INR  [name], Pharm.D. [phone] 06/10/2014 1:37 PM

## 2014-06-10 NOTE — H&P (Signed)
[facility] History and Physical  Patient: [name]  MRN: [id number]  DOB: 03-19-1941  DOS: the patient was seen and examined on 06/09/2014 PCP: [name], MD  Chief Complaint: Shortness of breath  HPI: [name] is a 74 y.o. male with Past medical history of A. fib on Coumadin, hypertension, chronic kidney disease with AV fistula, bladder cancer, mild to moderate aortic stenosis, chronic diastolic dysfunction, NSVT. The patient is presenting with complaints of progressively worsening shortness of breath. Patient was recently hospitalized for acute on chronic CHF and was discharged on 05/22/2014. His bicarbonate tablets were on hold and he was discharged on 60 mg of Lasix. Patient was reevaluated by cardiology 6 days ago at which time his Cardizem dose was increased. Patient mentions he has been weighing himself regularly on a daily basis and felt he has gained 6 pounds in 2 days and has increased his Lasix dose on Sunday, despite this his symptoms are not improving. He also had an episode of diarrhea over the weekend without any blood or nausea or vomiting or abdominal pain. Patient also becoming more short of breath on exertion, and today was even short of breath at rest. He has also noted some increased swelling in his legs. Denies any leg tenderness. Denies any fever or chills denies any chest pain chest heaviness chest tightness. He has a cough with clear sputum. He is compliant with all his medication. Although I do not have any documentation patient reported as well as ER physician reported that the patient was seen by his nephrology and was recommended to restart his bicarbonate as well as given 60 mg IV Lasix.  The patient is coming from home. And at his baseline independent for most of his ADL.  Review of Systems: as mentioned in the history of present illness.  A Comprehensive review of the other systems is negative.  Past Medical History  Diagnosis Date   . Paroxysmal atrial fibrillation     a. cardioversion 11/21/10 - Dr Radford Pax. b. 05/2013 - experiencing more SOB. 24-hr holter showed avg HR 49, max 82, min 38bpm. ETT showed baseline junctional bradycardia, HR incr only to 90bpm with drop in BP. Saw EP - amiodarone reduced and eventually d/c'd with improvement in dyspnea.  . Hypertension   . Warfarin anticoagulation   . CKD (chronic kidney disease), stage V     a. Followed by Dr. Jimmy Footman. b. IV fistula placed 04/17/14.  . Bladder cancer     a. 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  . Aortic valve stenosis     a. 10/2013 - mild to moderate stenosis by echo.  . Orthostatic hypotension   . Diastolic dysfunction   . Arthritis   . Self-catheterizes urinary bladder   . Junctional bradycardia   . NSVT (nonsustained ventricular tachycardia)     a. By holter 01/2014.   Past Surgical History  Procedure Laterality Date  . Cardioversion  11/21/2010  . Cystectomy    . Cholecystectomy    . Cataract extraction w/ intraocular lens  implant, bilateral    . Colonoscopy    . Av fistula placement Left 04/17/2014    Procedure: ARTERIOVENOUS (AV) FISTULA CREATION- LEFT UPPER ARM ;  Surgeon: Mal [name], MD;  Location: [place];  Service: Vascular;  Laterality: Left;   Social History:  reports that he quit smoking about 31 years ago. His smoking use included Cigarettes. He has a 66 pack-year smoking history. He has never used  smokeless tobacco. He reports that he does not drink alcohol or use illicit drugs.  Allergies  Allergen Reactions  . Amiodarone Other (See Comments)    Severely reduced DLCO  . Oxycodone Hcl Rash and Other (See Comments)    Rash and itching    Family History  Problem Relation Age of Onset  . CVA Mother   . Heart attack Father   . Heart disease Father   . Arrhythmia Father   . Alzheimer's disease Sister   . Multiple sclerosis Brother   . Alcohol abuse Father     Prior to Admission medications    Medication Sig Start Date End Date Taking? Authorizing Provider  B Complex-C (SUPER B COMPLEX PO) Take 1 capsule by mouth daily.    Yes Historical Provider, MD  bismuth subsalicylate (PEPTO BISMOL) 262 MG chewable tablet Chew 2 tablets (524 mg total) by mouth as needed for diarrhea or loose stools. 05/22/14  Yes Kinnie Feil, MD  calcitRIOL (ROCALTROL) 0.25 MCG capsule Take 1 capsule (0.25 mcg total) by mouth daily. Alternate 1 or 2 capsules daily 05/22/14  Yes Kinnie Feil, MD  Cinnamon 500 MG capsule Take 250 mg by mouth daily.    Yes Historical Provider, MD  diltiazem (CARDIZEM CD) 180 MG 24 hr capsule Take one capsule by mouth twice daily 06/03/14  Yes Evans Lance, MD  furosemide (LASIX) 20 MG tablet Take 3 tablets (60 mg total) by mouth daily. Patient taking differently: Take 20 mg by mouth daily.  05/22/14  Yes Kinnie Feil, MD  guaiFENesin-dextromethorphan (ROBITUSSIN DM) 100-10 MG/5ML syrup Take 15 mLs by mouth every 4 (four) hours as needed for cough.   Yes Historical Provider, MD  metoprolol succinate (TOPROL-XL) 50 MG 24 hr tablet Take 1 tablet (50 mg total) by mouth daily. Take with or immediately following a meal. Patient taking differently: Take 50 mg by mouth 2 (two) times daily. Take with or immediately following a meal. 05/22/14  Yes Kinnie Feil, MD  Misc Natural Products (OSTEO BI-FLEX ADV TRIPLE ST PO) Take 1 tablet by mouth daily.    Yes Historical Provider, MD  Multiple Vitamins-Minerals (CENTRUM SILVER ULTRA MENS PO) Take 1 tablet by mouth daily. Take daily   Yes Historical Provider, MD  warfarin (COUMADIN) 5 MG tablet Take 0.5 tablets (2.5 mg total) by mouth daily. Take as directed by coumadin clinic 05/22/14  Yes Kinnie Feil, MD  glucosamine-chondroitin 500-400 MG tablet Take 1 tablet by mouth daily. Patient not taking: Reported on 06/09/2014 05/22/14   Kinnie Feil, MD    Physical Exam: Filed Vitals:   06/09/14 2030 06/09/14 2100 06/09/14 2130  06/09/14 2214  BP: 149/75 153/79 143/82 158/86  Pulse: 79 75 79 72  Temp:    97.5 F (36.4 C)  TempSrc:    Oral  Resp: 17 18 20 20   Height:    5\' 9"  (1.753 m)  Weight:    82.781 kg (182 lb 8 oz)  SpO2: 95% 95% 96% 100%    General: Alert, Awake and Oriented to Time, Place and Person. Appear in mild distress Eyes: PERRL ENT: Oral Mucosa clear moist. Neck: mild JVD Cardiovascular: S1 and S2 Present, aortic systolic Murmur, Peripheral Pulses Present Respiratory: Bilateral Air entry equal and Decreased, faint basal Crackles, no wheezes Abdomen: Bowel Sound present, Soft and non tender Skin: no Rash Extremities: Bilateral  Pedal edema, no calf tenderness Neurologic: Grossly no focal neuro deficit.  Labs on Admission:  CBC:  Recent Labs Lab 06/09/14 1641 06/09/14 1852  WBC 10.6*  --   NEUTROABS  --  7.8*  HGB 11.6*  --   HCT 35.3*  --   MCV 94.9  --   PLT 214  --     CMP     Component Value Date/Time   NA 139 06/09/2014 1641   K 4.1 06/09/2014 1641   CL 117* 06/09/2014 1641   CO2 11* 06/09/2014 1641   GLUCOSE 102* 06/09/2014 1641   BUN 91* 06/09/2014 1641   CREATININE 5.48* 06/09/2014 1641   CALCIUM 8.9 06/09/2014 1641   PROT 6.6 05/20/2014 1632   ALBUMIN 3.6 05/20/2014 1632   AST 27 05/20/2014 1632   ALT 28 05/20/2014 1632   ALKPHOS 53 05/20/2014 1632   BILITOT 0.4 05/20/2014 1632   GFRNONAA 9* 06/09/2014 1641   GFRAA 11* 06/09/2014 1641    No results for input(s): LIPASE, AMYLASE in the last 168 hours.  No results for input(s): CKTOTAL, CKMB, CKMBINDEX, TROPONINI in the last 168 hours. BNP (last 3 results)  Recent Labs  04/29/14 1616 05/20/14 1632 06/09/14 1641  BNP 1281.2* 3101.1* 2524.7*    ProBNP (last 3 results)  Recent Labs  07/22/13 1440  PROBNP 580.0*     Radiological Exams on Admission: Dg Chest 2 View  06/09/2014   CLINICAL DATA:  Shortness of breath for several weeks.  EXAM: CHEST  2 VIEW  COMPARISON:  May 20, 2014.  FINDINGS:  Stable cardiomediastinal silhouette. Stable minimal bilateral pleural effusions are noted. No pneumothorax is noted. Stable mild central pulmonary vascular congestion is noted. Probable mild bibasilar interstitial edema is noted. Bony thorax appears intact.  IMPRESSION: Stable cardiomegaly and probable mild bibasilar pulmonary edema. Stable minimal bilateral pleural effusions.   Electronically Signed   By: Marijo Conception, M.D.   On: 06/09/2014 19:23    EKG: Independently reviewed. AV dissociation, sinus tachycardia, no significant changes as compared to prior EKG.  Assessment/Plan Principal Problem:   Acute on chronic diastolic CHF (congestive heart failure) Active Problems:   Essential hypertension   Aortic stenosis, moderate   Chronic kidney disease, stage V   Metabolic acidosis   1. Acute on chronic diastolic CHF (congestive heart failure) The patient is presenting with complaints of shortness of breath. He is visibly short of breath on examination. He has weight gain and has mild JVD with basal crackles. Chest x-ray shows congestion. Compared to earlier chest x-ray chest x-ray appears significantly improved and recently his BNP was 3000 and current BNP is also improved. Patient has mild increase in his serum creatinine. Patient has already received Lasix IV in the ER and will continue to closely monitor his ins and outs as well as daily weight. Further IV Lasix will be decided depending on his morning evaluation.  2.Shortness of breath, metabolic acidosis. Patient is appearing to have increased metabolic acidosis. Probable etiology is diarrhea contributing to metabolic acidosis as well as chronic kidney disease as well as stopping off patient's bicarbonate. Will resume patient's bicarbonate at present and monitor daily BMP. Possibility of hyperventilation in the setting of metabolic acidosis causing patient symptomatically short of breath cannot be ruled out.  3. Diarrhea. Patient  complains of diarrhea as well. We will check C. difficile.  4.Essential hypertension, A. fib, aortic stenosis. Continuing patient's recently adjusted home medications.  Advance goals of care discussion: Full code   DVT Prophylaxis: on chronic anticoagulation Nutrition: Cardiac and renal diet  Disposition: Admitted to inpatient in telemetry  unit.  Author: Berle Mull, MD [facility] Pager: [phone]  If 7PM-7AM, please contact night-coverage [email or web address] Password [email or web address]

## 2014-06-10 NOTE — Progress Notes (Signed)
ANTICOAGULATION CONSULT NOTE - Initial Consult  Pharmacy Consult for Coumadin Indication: atrial fibrillation  Allergies  Allergen Reactions  . Amiodarone Other (See Comments)    Severely reduced DLCO  . Oxycodone Hcl Rash and Other (See Comments)    Rash and itching    Patient Measurements: Height: 5\' 9"  (175.3 cm) Weight: 182 lb 8 oz (82.781 kg) IBW/kg (Calculated) : 70.7  Vital Signs: Temp: 97.8 F (36.6 C) (02/10 0225) Temp Source: Oral (02/10 0225) BP: 147/76 mmHg (02/10 0225) Pulse Rate: 74 (02/10 0225)  Labs:  Recent Labs  06/09/14 1641 06/09/14 1834  HGB 11.6*  --   HCT 35.3*  --   PLT 214  --   LABPROT  --  28.3*  INR  --  2.63*  CREATININE 5.48*  --     Estimated Creatinine Clearance: 11.8 mL/min (by C-G formula based on Cr of 5.48).   Medical History: Past Medical History  Diagnosis Date  . Paroxysmal atrial fibrillation     a. cardioversion 11/21/10 - Dr Radford Pax. b. 05/2013 - experiencing more SOB. 24-hr holter showed avg HR 49, max 82, min 38bpm. ETT showed baseline junctional bradycardia, HR incr only to 90bpm with drop in BP. Saw EP - amiodarone reduced and eventually d/c'd with improvement in dyspnea.  . Hypertension   . Warfarin anticoagulation   . CKD (chronic kidney disease), stage V     a. Followed by Dr. Jimmy Footman. b. IV fistula placed 04/17/14.  . Bladder cancer     a. 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  . Aortic valve stenosis     a. 10/2013 - mild to moderate stenosis by echo.  . Orthostatic hypotension   . Diastolic dysfunction   . Arthritis   . Self-catheterizes urinary bladder   . Junctional bradycardia   . NSVT (nonsustained ventricular tachycardia)     a. By holter 01/2014.    Medications:  Prescriptions prior to admission  Medication Sig Dispense Refill Last Dose  . B Complex-C (SUPER B COMPLEX PO) Take 1 capsule by mouth daily.    06/09/2014 at Unknown time  . bismuth subsalicylate (PEPTO BISMOL)  262 MG chewable tablet Chew 2 tablets (524 mg total) by mouth as needed for diarrhea or loose stools. 30 tablet 0 unknown  . calcitRIOL (ROCALTROL) 0.25 MCG capsule Take 1 capsule (0.25 mcg total) by mouth daily. Alternate 1 or 2 capsules daily 60 capsule 1 06/09/2014 at Unknown time  . Cinnamon 500 MG capsule Take 250 mg by mouth daily.    06/09/2014 at Unknown time  . diltiazem (CARDIZEM CD) 180 MG 24 hr capsule Take one capsule by mouth twice daily   06/09/2014 at Unknown time  . furosemide (LASIX) 20 MG tablet Take 3 tablets (60 mg total) by mouth daily. (Patient taking differently: Take 20 mg by mouth daily. ) 30 tablet 0 06/09/2014 at Unknown time  . guaiFENesin-dextromethorphan (ROBITUSSIN DM) 100-10 MG/5ML syrup Take 15 mLs by mouth every 4 (four) hours as needed for cough.   06/08/2014 at Unknown time  . metoprolol succinate (TOPROL-XL) 50 MG 24 hr tablet Take 1 tablet (50 mg total) by mouth daily. Take with or immediately following a meal. (Patient taking differently: Take 50 mg by mouth 2 (two) times daily. Take with or immediately following a meal.) 60 tablet 0 06/09/2014 at 0900  . Misc Natural Products (OSTEO BI-FLEX ADV TRIPLE ST PO) Take 1 tablet by mouth daily.    06/09/2014 at Unknown time  .  Multiple Vitamins-Minerals (CENTRUM SILVER ULTRA MENS PO) Take 1 tablet by mouth daily. Take daily   06/09/2014 at Unknown time  . warfarin (COUMADIN) 5 MG tablet Take 0.5 tablets (2.5 mg total) by mouth daily. Take as directed by coumadin clinic 90 tablet 1 06/09/2014 at Unknown time  . glucosamine-chondroitin 500-400 MG tablet Take 1 tablet by mouth daily. (Patient not taking: Reported on 06/09/2014) 30 tablet 0 Taking    Assessment: 74 yo male admitted with SOB/CHF, h/o Afib, to continue Coumadin Goal of Therapy:  INR 2-3 Monitor platelets by anticoagulation protocol: Yes   Plan:  F/U daily INR  Jan Olano, Bronson Curb 06/10/2014,3:09 AM

## 2014-06-10 NOTE — Progress Notes (Signed)
Patient Demographics  [name], is a 74 y.o. male, DOB - [birth year], ASN:[id number]  Admit date - 06/09/2014   Admitting Physician Berle Mull, MD  Outpatient Primary MD for the patient is [name], MD  LOS - 1   Chief Complaint  Patient presents with  . Shortness of Breath        Subjective:   [name] today has, No headache, No chest pain, No abdominal pain - No Nausea, No new weakness tingling or numbness, No Cough - Mild SOB.    Assessment & Plan    1. Persistent shortness of breath. Reason unclear. Exam not consistent with large fluid overload or severe acute on chronic CHF. We will obtain a VQ scan as creatinine is elevated, if workup is unrevealing will involve pulmonary. May require PFTs/high-resolution CT etc. Continue gentle diuresis for now.   2. Underlying moderate to severe aortic stenosis. Valve gradient currently is 1.2 cms. Outpatient cardiology follow-up.   3. Hypertension hypertension. On diltiazem beta blocker stable.   4. Acute renal failure with chronic Kidney disease stage V - beaseline creatinine around 4.5, monitor gentle diuresis.   5. Atrial fibrillation. Continue Cardizem and Coumadin monitor.   6. Metabolic acidosis. Due to comminution of chronic kidney disease stage V along with diarrhea. Anion gap is 11. We will place on oral bicarbonate and monitor    Code Status: Full  Family Communication: None  Disposition Plan: Home   Procedures   Recent TTE  - Left ventricle: The cavity size was normal. There was mild concentric hypertrophy. Systolic function was normal. Theestimated ejection fraction was in the range of 55% to 60%. Wallmotion was normal; there were no regional wall motionabnormalities. Features are consistent with a  pseudonormal leftventricular filling pattern, with concomitant abnormal relaxationand increased filling pressure (grade 2 diastolic dysfunction). - Aortic valve: There was mild to moderate stenosis. There wastrivial regurgitation. There was no significant perivalvularregurgitation. Valve area (VTI): 1.45 cm^2. Valve area (Vmax): 1.22 cm^2. - Mitral valve: Severely calcified annulus. The findings areconsistent with trivial stenosis. There was mild regurgitation.Valve area by pressure half-time: 2.16 cm^2. Valve area bycontinuity equation (using LVOT flow): 1.41 cm^2. - Left atrium: The atrium was severely dilated. - Right atrium: The atrium was mildly dilated.   VQ   Lower ext [place] - Preliminary findings = Negative for DVT.     Consults      Medications  Scheduled Meds: . calcitRIOL  0.25 mcg Oral QODAY  . [START ON 06/11/2014] calcitRIOL  0.5 mcg Oral QODAY  . diltiazem  180 mg Oral BID  . metoprolol succinate  50 mg Oral BID  . sodium bicarbonate  1,300 mg Oral TID  . sodium chloride  3 mL Intravenous Q12H  . Warfarin - Pharmacist Dosing Inpatient   Does not apply q1800   Continuous Infusions:  PRN Meds:.acetaminophen **OR** acetaminophen, bismuth subsalicylate, ondansetron **OR** ondansetron (ZOFRAN) IV  DVT Prophylaxis  Coaumdin  Lab Results  Component Value Date   INR 2.88* 06/10/2014   INR 2.63* 06/09/2014   INR 2.0 06/03/2014     Lab Results  Component Value Date   PLT 202 06/10/2014    Antibiotics     Anti-infectives    None  Objective:   Filed Vitals:   06/09/14 2214 06/10/14 0225 06/10/14 0446 06/10/14 1143  BP: 158/86 147/76 139/96 146/76  Pulse: 72 74 69 77  Temp: 97.5 F (36.4 C) 97.8 F (36.6 C) 97.6 F (36.4 C)   TempSrc: Oral Oral Oral   Resp: 20 20 18    Height: 5\' 9"  (1.753 m)     Weight: 82.781 kg (182 lb 8 oz)  82.702 kg (182 lb 5.2 oz)   SpO2: 100% 94% 96%     Wt Readings from Last 3 Encounters:  06/10/14  82.702 kg (182 lb 5.2 oz)  06/03/14 84.46 kg (186 lb 3.2 oz)  06/02/14 83.915 kg (185 lb)     Intake/Output Summary (Last 24 hours) at 06/10/14 1231 Last data filed at 06/10/14 1144  Gross per 24 hour  Intake    826 ml  Output   1900 ml  Net  -1074 ml     Physical Exam  Awake Alert, Oriented X 3, No new F.N deficits, Normal affect [place].AT,PERRAL Supple Neck,No JVD, No cervical lymphadenopathy appriciated.  Symmetrical Chest wall movement, Good air movement bilaterally, CTAB RRR,No Gallops,Rubs , 2 aortic systolic murmur, No Parasternal Heave +ve B.Sounds, Abd Soft, No tenderness, No organomegaly appriciated, No rebound - guarding or rigidity. No Cyanosis, Clubbing or edema, No new Rash or bruise      Data Review   Micro Results No results found for this or any previous visit (from the past 240 hour(s)).  Radiology Reports Dg Chest 2 View  06/09/2014   CLINICAL DATA:  Shortness of breath for several weeks.  EXAM: CHEST  2 VIEW  COMPARISON:  May 20, 2014.  FINDINGS: Stable cardiomediastinal silhouette. Stable minimal bilateral pleural effusions are noted. No pneumothorax is noted. Stable mild central pulmonary vascular congestion is noted. Probable mild bibasilar interstitial edema is noted. Bony thorax appears intact.  IMPRESSION: Stable cardiomegaly and probable mild bibasilar pulmonary edema. Stable minimal bilateral pleural effusions.   Electronically Signed   By: Marijo Conception, M.D.   On: 06/09/2014 19:23   Dg Chest 2 View  05/20/2014   CLINICAL DATA:  Short of breath, cough for several weeks  EXAM: CHEST  2 VIEW  COMPARISON:  Radiograph 04/29/2014  FINDINGS: Normal cardiac silhouette. The bilateral pleural effusions which have improved on the left compared to prior. There is interstitial edema pattern similar prior. No focal consolidation.  IMPRESSION: 1. Persistent but improved bilateral pleural effusions. 2. Interstitial edema pattern.   Electronically Signed   By:  Suzy Bouchard M.D.   On: 05/20/2014 18:00     CBC  Recent Labs Lab 06/09/14 1641 06/09/14 1852 06/10/14 0310  WBC 10.6*  --  9.4  HGB 11.6*  --  10.9*  HCT 35.3*  --  33.3*  PLT 214  --  202  MCV 94.9  --  94.3  MCH 31.2  --  30.9  MCHC 32.9  --  32.7  RDW 16.4*  --  16.5*  LYMPHSABS  --  1.7 1.5  MONOABS  --  0.8 0.8  EOSABS  --  0.3 0.4  BASOSABS  --  0.0 0.0    Chemistries   Recent Labs Lab 06/09/14 1641 06/10/14 0310  NA 139 139  K 4.1 3.7  CL 117* 116*  CO2 11* 12*  GLUCOSE 102* 97  BUN 91* 85*  CREATININE 5.48* 5.14*  CALCIUM 8.9 8.9  AST  --  30  ALT  --  48  ALKPHOS  --  55  BILITOT  --  0.7   ------------------------------------------------------------------------------------------------------------------ estimated creatinine clearance is 12.6 mL/min (by C-G formula based on Cr of 5.14). ------------------------------------------------------------------------------------------------------------------ No results for input(s): HGBA1C in the last 72 hours. ------------------------------------------------------------------------------------------------------------------ No results for input(s): CHOL, HDL, LDLCALC, TRIG, CHOLHDL, LDLDIRECT in the last 72 hours. ------------------------------------------------------------------------------------------------------------------ No results for input(s): TSH, T4TOTAL, T3FREE, THYROIDAB in the last 72 hours.  Invalid input(s): FREET3 ------------------------------------------------------------------------------------------------------------------ No results for input(s): VITAMINB12, FOLATE, FERRITIN, TIBC, IRON, RETICCTPCT in the last 72 hours.  Coagulation profile  Recent Labs Lab 06/09/14 1834 06/10/14 0310  INR 2.63* 2.88*    No results for input(s): DDIMER in the last 72 hours.  Cardiac Enzymes No results for input(s): CKMB, TROPONINI, MYOGLOBIN in the last 168 hours.  Invalid input(s):  CK ------------------------------------------------------------------------------------------------------------------ Invalid input(s): POCBNP     Time Spent in minutes  35   SINGH,PRASHANT K M.D on 06/10/2014 at 12:31 PM  Between 7am to 7pm - Pager - [phone]  After 7pm go to [email or web address] - [facility] Office  [phone]

## 2014-06-11 ENCOUNTER — Inpatient Hospital Stay (HOSPITAL_COMMUNITY): Payer: MEDICARE

## 2014-06-11 DIAGNOSIS — R0600 Dyspnea, unspecified: Secondary | ICD-10-CM

## 2014-06-11 DIAGNOSIS — R942 Abnormal results of pulmonary function studies: Secondary | ICD-10-CM

## 2014-06-11 LAB — BASIC METABOLIC PANEL
Anion gap: 8 (ref 5–15)
BUN: 74 mg/dL — ABNORMAL HIGH (ref 6–23)
CO2: 15 mmol/L — AB (ref 19–32)
Calcium: 8.4 mg/dL (ref 8.4–10.5)
Chloride: 116 mmol/L — ABNORMAL HIGH (ref 96–112)
Creatinine, Ser: 4.58 mg/dL — ABNORMAL HIGH (ref 0.50–1.35)
GFR calc Af Amer: 13 mL/min — ABNORMAL LOW (ref >90)
GFR, EST NON AFRICAN AMERICAN: 11 mL/min — AB (ref >90)
Glucose, Bld: 99 mg/dL (ref 70–99)
POTASSIUM: 3.4 mmol/L — AB (ref 3.5–5.1)
SODIUM: 139 mmol/L (ref 135–145)

## 2014-06-11 LAB — PROTIME-INR
INR: 2.53 — ABNORMAL HIGH (ref 0.00–1.49)
Prothrombin Time: 27.4 seconds — ABNORMAL HIGH (ref 11.6–15.2)

## 2014-06-11 MED ORDER — POTASSIUM CHLORIDE CRYS ER 20 MEQ PO TBCR
40.0000 meq | EXTENDED_RELEASE_TABLET | Freq: Once | ORAL | Status: AC
  Administered 2014-06-11: 40 meq via ORAL
  Filled 2014-06-11: qty 2

## 2014-06-11 NOTE — Progress Notes (Signed)
Patient Demographics  [name], is a 73 y.o. male, DOB - 1941-04-17, WER:[id number]  Admit date - 06/09/2014   Admitting Physician Berle Mull, MD  Outpatient Primary MD for the patient is [name], MD  LOS - 2   Chief Complaint  Patient presents with  . Shortness of Breath        Subjective:   [name] today has, No headache, No chest pain, No abdominal pain - No Nausea, No new weakness tingling or numbness, No Cough - Mild SOB.    Assessment & Plan    1. Persistent shortness of breath. Reason unclear. Exam not consistent with large fluid overload or severe acute on chronic CHF diastolic CHF with EF 19% on recent echo. Continue gentle diuresis for now. Noted his PFTs from 1 year ago which showed severely reduced DLCO, discussed with pulmonary for a consult, will get a high-resolution chest CT along with a echogram to look at right-sided pressures. His problems are largely from underlying pulmonary issues with slight diastolic decompensation.   2. Underlying moderate to severe aortic stenosis. Valve gradient currently is 1.2 cms. Outpatient cardiology follow-up. Follows with Dr. Radford Pax.   3. Hypertension hypertension. On diltiazem beta blocker stable.   4. Acute renal failure with chronic Kidney disease stage V - beaseline creatinine around 4.5, monitor gentle diuresis.   5. Atrial fibrillation. Continue Cardizem and Coumadin monitor.   6. Metabolic acidosis. Due to comminution of chronic kidney disease stage V along with diarrhea. Anion gap is 11. Improving on oral bicarbonate.   7. Hypokalemia. Replaced. Monitor    Code Status: Full  Family Communication: None  Disposition Plan: Home   Procedures   Recent TTE  - Left ventricle: The cavity size was  normal. There was mild concentric hypertrophy. Systolic function was normal. Theestimated ejection fraction was in the range of 55% to 60%. Wallmotion was normal; there were no regional wall motionabnormalities. Features are consistent with a pseudonormal leftventricular filling pattern, with concomitant abnormal relaxationand increased filling pressure (grade 2 diastolic dysfunction). - Aortic valve: There was mild to moderate stenosis. There wastrivial regurgitation. There was no significant perivalvularregurgitation. Valve area (VTI): 1.45 cm^2. Valve area (Vmax): 1.22 cm^2. - Mitral valve: Severely calcified annulus. The findings areconsistent with trivial stenosis. There was mild regurgitation.Valve area by pressure half-time: 2.16 cm^2. Valve area bycontinuity equation (using LVOT flow): 1.41 cm^2. - Left atrium: The atrium was severely dilated. - Right atrium: The atrium was mildly dilated.   VQ Low probability for PE, ventilation defect in both upper lung fields.   Lower ext [place] - Preliminary findings = Negative for DVT.   Echogram   High-resolution CT chest   Consults   pulmonary   Medications  Scheduled Meds: . calcitRIOL  0.25 mcg Oral QODAY  . calcitRIOL  0.5 mcg Oral QODAY  . diltiazem  180 mg Oral BID  . metoprolol succinate  50 mg Oral BID  . sodium bicarbonate  1,300 mg Oral TID  . sodium chloride  3 mL Intravenous Q12H  . warfarin  2.5 mg Oral q1800  . Warfarin - Pharmacist Dosing Inpatient   Does not apply q1800   Continuous Infusions:  PRN Meds:.acetaminophen **OR** acetaminophen, bismuth subsalicylate, ondansetron **OR** ondansetron (ZOFRAN)  IV  DVT Prophylaxis  Coaumdin  Lab Results  Component Value Date   INR 2.53* 06/11/2014   INR 2.88* 06/10/2014   INR 2.63* 06/09/2014     Lab Results  Component Value Date   PLT 202 06/10/2014    Antibiotics     Anti-infectives    None          Objective:   Filed Vitals:   06/10/14  2138 06/11/14 0212 06/11/14 0610 06/11/14 0822  BP: 157/76 135/83 128/80 148/76  Pulse: 78 76 71 70  Temp: 98.1 F (36.7 C) 97.9 F (36.6 C) 97.4 F (36.3 C) 98 F (36.7 C)  TempSrc: Oral Oral Oral Oral  Resp: 18 18 18    Height:      Weight:   80.423 kg (177 lb 4.8 oz)   SpO2: 98% 97% 96% 97%    Wt Readings from Last 3 Encounters:  06/11/14 80.423 kg (177 lb 4.8 oz)  06/03/14 84.46 kg (186 lb 3.2 oz)  06/02/14 83.915 kg (185 lb)     Intake/Output Summary (Last 24 hours) at 06/11/14 1119 Last data filed at 06/11/14 0849  Gross per 24 hour  Intake   1490 ml  Output   2000 ml  Net   -510 ml     Physical Exam  Awake Alert, Oriented X 3, No new F.N deficits, Normal affect [place].AT,PERRAL Supple Neck,No JVD, No cervical lymphadenopathy appriciated.  Symmetrical Chest wall movement, Good air movement bilaterally, CTAB RRR,No Gallops,Rubs , 2 aortic systolic murmur, No Parasternal Heave +ve B.Sounds, Abd Soft, No tenderness, No organomegaly appriciated, No rebound - guarding or rigidity. No Cyanosis, Clubbing or edema, No new Rash or bruise      Data Review   Micro Results Recent Results (from the past 240 hour(s))  Clostridium Difficile by PCR     Status: None   Collection Time: 06/10/14  1:24 PM  Result Value Ref Range Status   C difficile by pcr NEGATIVE NEGATIVE Final    Radiology Reports Dg Chest 2 View  06/09/2014   CLINICAL DATA:  Shortness of breath for several weeks.  EXAM: CHEST  2 VIEW  COMPARISON:  May 20, 2014.  FINDINGS: Stable cardiomediastinal silhouette. Stable minimal bilateral pleural effusions are noted. No pneumothorax is noted. Stable mild central pulmonary vascular congestion is noted. Probable mild bibasilar interstitial edema is noted. Bony thorax appears intact.  IMPRESSION: Stable cardiomegaly and probable mild bibasilar pulmonary edema. Stable minimal bilateral pleural effusions.   Electronically Signed   By: Marijo Conception, M.D.   On:  06/09/2014 19:23   Dg Chest 2 View  05/20/2014   CLINICAL DATA:  Short of breath, cough for several weeks  EXAM: CHEST  2 VIEW  COMPARISON:  Radiograph 04/29/2014  FINDINGS: Normal cardiac silhouette. The bilateral pleural effusions which have improved on the left compared to prior. There is interstitial edema pattern similar prior. No focal consolidation.  IMPRESSION: 1. Persistent but improved bilateral pleural effusions. 2. Interstitial edema pattern.   Electronically Signed   By: Suzy Bouchard M.D.   On: 05/20/2014 18:00   Nm Pulmonary Perf And Vent  06/10/2014   CLINICAL DATA:  Shortness of breath.  EXAM: NUCLEAR MEDICINE VENTILATION - PERFUSION LUNG SCAN  TECHNIQUE: Ventilation images were obtained in multiple projections using inhaled aerosol technetium 99 M DTPA. Perfusion images were obtained in multiple projections after intravenous injection of Tc-21m MAA.  RADIOPHARMACEUTICALS:  40.0 mCi Tc-80m DTPA aerosol and 6.0 mCi Tc-29m MAA  COMPARISON:  Chest x-ray 06/09/2014.  FINDINGS: Ventilation: Poor ventilation upper lungs suggesting COPD.  Perfusion: Tiny subsegmental defect right upper lobe on the perfusion scan cannot be entirely excluded. No prominent or multiple defects noted.  IMPRESSION: Poor ventilation both upper lobes most likely from COPD. Low probability of pulmonary embolus.   Electronically Signed   By: Marcello Moores  Register   On: 06/10/2014 14:35     CBC  Recent Labs Lab 06/09/14 1641 06/09/14 1852 06/10/14 0310  WBC 10.6*  --  9.4  HGB 11.6*  --  10.9*  HCT 35.3*  --  33.3*  PLT 214  --  202  MCV 94.9  --  94.3  MCH 31.2  --  30.9  MCHC 32.9  --  32.7  RDW 16.4*  --  16.5*  LYMPHSABS  --  1.7 1.5  MONOABS  --  0.8 0.8  EOSABS  --  0.3 0.4  BASOSABS  --  0.0 0.0    Chemistries   Recent Labs Lab 06/09/14 1641 06/10/14 0310 06/11/14 0457  NA 139 139 139  K 4.1 3.7 3.4*  CL 117* 116* 116*  CO2 11* 12* 15*  GLUCOSE 102* 97 99  BUN 91* 85* 74*  CREATININE  5.48* 5.14* 4.58*  CALCIUM 8.9 8.9 8.4  AST  --  30  --   ALT  --  48  --   ALKPHOS  --  47  --   BILITOT  --  0.7  --    ------------------------------------------------------------------------------------------------------------------ estimated creatinine clearance is 14.2 mL/min (by C-G formula based on Cr of 4.58). ------------------------------------------------------------------------------------------------------------------ No results for input(s): HGBA1C in the last 72 hours. ------------------------------------------------------------------------------------------------------------------ No results for input(s): CHOL, HDL, LDLCALC, TRIG, CHOLHDL, LDLDIRECT in the last 72 hours. ------------------------------------------------------------------------------------------------------------------ No results for input(s): TSH, T4TOTAL, T3FREE, THYROIDAB in the last 72 hours.  Invalid input(s): FREET3 ------------------------------------------------------------------------------------------------------------------ No results for input(s): VITAMINB12, FOLATE, FERRITIN, TIBC, IRON, RETICCTPCT in the last 72 hours.  Coagulation profile  Recent Labs Lab 06/09/14 1834 06/10/14 0310 06/11/14 0457  INR 2.63* 2.88* 2.53*    No results for input(s): DDIMER in the last 72 hours.  Cardiac Enzymes No results for input(s): CKMB, TROPONINI, MYOGLOBIN in the last 168 hours.  Invalid input(s): CK ------------------------------------------------------------------------------------------------------------------ Invalid input(s): POCBNP     Time Spent in minutes  35   SINGH,PRASHANT K M.D on 06/11/2014 at 11:19 AM  Between 7am to 7pm - Pager - [phone]  After 7pm go to [email or web address] - [facility] Office  [phone]

## 2014-06-11 NOTE — Progress Notes (Signed)
[facility] for Coumadin Indication: atrial fibrillation  Allergies  Allergen Reactions  . Amiodarone Other (See Comments)    Severely reduced DLCO  . Oxycodone Hcl Rash and Other (See Comments)    Rash and itching    Patient Measurements: Height: 5\' 9"  (175.3 cm) Weight: 177 lb 4.8 oz (80.423 kg) (scale b) IBW/kg (Calculated) : 70.7  Vital Signs: Temp: 98 F (36.7 C) (02/11 0822) Temp Source: Oral (02/11 0822) BP: 148/76 mmHg (02/11 0822) Pulse Rate: 70 (02/11 0822)  Labs:  Recent Labs  06/09/14 1641 06/09/14 1834 06/10/14 0310 06/11/14 0457  HGB 11.6*  --  10.9*  --   HCT 35.3*  --  33.3*  --   PLT 214  --  202  --   LABPROT  --  28.3* 30.4* 27.4*  INR  --  2.63* 2.88* 2.53*  CREATININE 5.48*  --  5.14* 4.58*    Estimated Creatinine Clearance: 14.2 mL/min (by C-G formula based on Cr of 4.58).   Assessment: 74 yo male admitted with SOB/CHF, h/o Afib, to continue Coumadin. Home coumadin dose 2.5 mg daily with last dose 06/09/14.  INR therapeutic at 2.53 and was therapeutic  on admission at 2.63.   Last coumadin clinic INR therapeutic at 2.0 on 06/03/14.   Hg 10.9, pltc 202.  No bleeding reported.  VQ scan negative for PE. Dopplers negative for DVT.   No bleeding reported.   Goal of Therapy:  INR 2-3   Plan:  -coumadin 2.5 mg daily  - decrease INR to TTS only  Eudelia Bunch, Pharm.D. [phone] 06/11/2014 1:27 PM

## 2014-06-11 NOTE — Consult Note (Signed)
Name: [name] MRN: [id number] DOB: Sep 16, 1940    ADMISSION DATE:  06/09/2014 CONSULTATION DATE:  06/11/2014  REFERRING MD :  Candiss Norse  CHIEF COMPLAINT:  SOB  BRIEF PATIENT DESCRIPTION: 74 y.o. M brought to [facility] ED 2/9 with progressive SOB.  Has been seen by Dr. Melvyn Novas as outpatient for SOB, concern for amio toxicity back in 2015.  PCCM consulted for further recs.  SIGNIFICANT EVENTS  2/9 - admit 2/11 - PCCM consult  STUDIES:  CXR 2/9 >>> stable cardiomegaly and mild edema, stable small B effusions. Echo 2/9 >>> VQ 2/10 >>> poor ventilation in both upper lobes likely from COPD, low prob for PE LE dopplers 2/10 >>> HRCT chest 2/11 >>>  HISTORY OF PRESENT ILLNESS:  [name] is a 74 y.o. M with PMH as outlined below who presented to [facility] 2/9 with progressive SOB.  He was recently admitted and just discharged 1/22.  During that admission, he was seen by cardiology and cardizem dose adjusted.  Pt states he has gained roughly 6 pounds in the past 2 days and despite increasing his lasix dose, symptoms have not changed. He denies any fevers/chills/sweats, productive cough, chest pain, N/V, abd pain, myalgias.  Did have one episode of diarrhea over weekend but has since stopped.  Does report LE swelling but denies any calf pain.    He is a former smoker, roughly 50 pack-year history (quit in 1985).  Of note, he has been seen by Dr. Melvyn Novas as an outpatient (last visit 09/12/13).  Had PFT's in March 2015 which showed significantly reduced DLCO (43%) with mild obstruction / COPD.  He was previously on amiodarone for A.fib; however, this was stopped 07/23/13 and at visit with Dr. Melvyn Novas, pt reported that his symptoms had definitely improved after stopping the amio.  There was a concern by both Dr. Melvyn Novas and pt's cardiologist, Dr. Radford Pax for amio toxicity  PAST MEDICAL HISTORY :   has a past medical history of Paroxysmal atrial fibrillation; Hypertension; Warfarin anticoagulation; CKD (chronic kidney  disease), stage V; Bladder cancer; Aortic valve stenosis; Orthostatic hypotension; Diastolic dysfunction; Arthritis; Self-catheterizes urinary bladder; Junctional bradycardia; NSVT (nonsustained ventricular tachycardia); and Shortness of breath dyspnea.  has past surgical history that includes Cardioversion (11/21/2010); Cystectomy; Cholecystectomy; Cataract extraction w/ intraocular lens  implant, bilateral; Colonoscopy; and AV fistula placement (Left, 04/17/2014). Prior to Admission medications   Medication Sig Start Date End Date Taking? Authorizing Provider  B Complex-C (SUPER B COMPLEX PO) Take 1 capsule by mouth daily.    Yes Historical Provider, MD  bismuth subsalicylate (PEPTO BISMOL) 262 MG chewable tablet Chew 2 tablets (524 mg total) by mouth as needed for diarrhea or loose stools. 05/22/14  Yes Kinnie Feil, MD  calcitRIOL (ROCALTROL) 0.25 MCG capsule Take 1 capsule (0.25 mcg total) by mouth daily. Alternate 1 or 2 capsules daily 05/22/14  Yes Kinnie Feil, MD  Cinnamon 500 MG capsule Take 250 mg by mouth daily.    Yes Historical Provider, MD  diltiazem (CARDIZEM CD) 180 MG 24 hr capsule Take one capsule by mouth twice daily 06/03/14  Yes Evans Lance, MD  furosemide (LASIX) 20 MG tablet Take 3 tablets (60 mg total) by mouth daily. Patient taking differently: Take 20 mg by mouth daily.  05/22/14  Yes Kinnie Feil, MD  guaiFENesin-dextromethorphan (ROBITUSSIN DM) 100-10 MG/5ML syrup Take 15 mLs by mouth every 4 (four) hours as needed for cough.   Yes Historical Provider, MD  metoprolol succinate (TOPROL-XL) 50  MG 24 hr tablet Take 1 tablet (50 mg total) by mouth daily. Take with or immediately following a meal. Patient taking differently: Take 50 mg by mouth 2 (two) times daily. Take with or immediately following a meal. 05/22/14  Yes Kinnie Feil, MD  Misc Natural Products (OSTEO BI-FLEX ADV TRIPLE ST PO) Take 1 tablet by mouth daily.    Yes Historical Provider, MD  Multiple  Vitamins-Minerals (CENTRUM SILVER ULTRA MENS PO) Take 1 tablet by mouth daily. Take daily   Yes Historical Provider, MD  warfarin (COUMADIN) 5 MG tablet Take 0.5 tablets (2.5 mg total) by mouth daily. Take as directed by coumadin clinic 05/22/14  Yes Kinnie Feil, MD  glucosamine-chondroitin 500-400 MG tablet Take 1 tablet by mouth daily. Patient not taking: Reported on 06/09/2014 05/22/14   Kinnie Feil, MD   Allergies  Allergen Reactions  . Amiodarone Other (See Comments)    Severely reduced DLCO  . Oxycodone Hcl Rash and Other (See Comments)    Rash and itching    FAMILY HISTORY:  family history includes Alcohol abuse in his father; Alzheimer's disease in his sister; Arrhythmia in his father; CVA in his mother; Heart attack in his father; Heart disease in his father; Multiple sclerosis in his brother. SOCIAL HISTORY:  reports that he quit smoking about 31 years ago. His smoking use included Cigarettes. He has a 66 pack-year smoking history. He has never used smokeless tobacco. He reports that he does not drink alcohol or use illicit drugs.  REVIEW OF SYSTEMS:   All negative; except for those that are bolded, which indicate positives.  Constitutional: weight loss, weight gain, night sweats, fevers, chills, fatigue, weakness.  HEENT: headaches, sore throat, sneezing, nasal congestion, post nasal drip, difficulty swallowing, tooth/dental problems, visual complaints, visual changes, ear aches. Neuro: difficulty with speech, weakness, numbness, ataxia. CV:  chest pain, orthopnea, PND, swelling in lower extremities, weight gain, dizziness, palpitations, syncope.  Resp: cough, hemoptysis, dyspnea, wheezing. GI  heartburn, indigestion, abdominal pain, nausea, vomiting, diarrhea, constipation, change in bowel habits, loss of appetite, hematemesis, melena, hematochezia.  GU: dysuria, change in color of urine, urgency or frequency, flank pain, hematuria. MSK: joint pain or swelling, decreased  range of motion. Psych: change in mood or affect, depression, anxiety, suicidal ideations, homicidal ideations. Skin: rash, itching, bruising.   SUBJECTIVE:   Feels much better compare to admission.  Denies chest pain, SOB at rest.  Does get SOB with exertion (which is how he was prior to admission).  VITAL SIGNS: Temp:  [97.4 F (36.3 C)-98.1 F (36.7 C)] 98 F (36.7 C) (02/11 0822) Pulse Rate:  [70-80] 70 (02/11 0822) Resp:  [18] 18 (02/11 0610) BP: (128-157)/(70-83) 148/76 mmHg (02/11 0822) SpO2:  [95 %-98 %] 97 % (02/11 0822) Weight:  [80.423 kg (177 lb 4.8 oz)] 80.423 kg (177 lb 4.8 oz) (02/11 0610)  PHYSICAL EXAMINATION: General: Pleasant male, resting in bed, in NAD. Neuro: A&O x 3, non-focal.  HEENT: [place]/AT. PERRL, sclerae anicteric. Cardiovascular: IRIR, 3/6 harsh M of aortic origin Lungs: Respirations even and unlabored.  CTA bilaterally, No W/R/R. Abdomen: BS x 4, soft, NT/ND.  Musculoskeletal: No gross deformities, no edema.  Skin: Intact, warm, no rashes.   Recent Labs Lab 06/09/14 1641 06/10/14 0310 06/11/14 0457  NA 139 139 139  K 4.1 3.7 3.4*  CL 117* 116* 116*  CO2 11* 12* 15*  BUN 91* 85* 74*  CREATININE 5.48* 5.14* 4.58*  GLUCOSE 102* 97 99  Recent Labs Lab 06/09/14 1641 06/10/14 0310  HGB 11.6* 10.9*  HCT 35.3* 33.3*  WBC 10.6* 9.4  PLT 214 202   Dg Chest 2 View  06/09/2014   CLINICAL DATA:  Shortness of breath for several weeks.  EXAM: CHEST  2 VIEW  COMPARISON:  May 20, 2014.  FINDINGS: Stable cardiomediastinal silhouette. Stable minimal bilateral pleural effusions are noted. No pneumothorax is noted. Stable mild central pulmonary vascular congestion is noted. Probable mild bibasilar interstitial edema is noted. Bony thorax appears intact.  IMPRESSION: Stable cardiomegaly and probable mild bibasilar pulmonary edema. Stable minimal bilateral pleural effusions.   Electronically Signed   By: Marijo Conception, M.D.   On: 06/09/2014 19:23    Nm Pulmonary Perf And Vent  06/10/2014   CLINICAL DATA:  Shortness of breath.  EXAM: NUCLEAR MEDICINE VENTILATION - PERFUSION LUNG SCAN  TECHNIQUE: Ventilation images were obtained in multiple projections using inhaled aerosol technetium 99 M DTPA. Perfusion images were obtained in multiple projections after intravenous injection of Tc-59m MAA.  RADIOPHARMACEUTICALS:  40.0 mCi Tc-60m DTPA aerosol and 6.0 mCi Tc-51m MAA  COMPARISON:  Chest x-ray 06/09/2014.  FINDINGS: Ventilation: Poor ventilation upper lungs suggesting COPD.  Perfusion: Tiny subsegmental defect right upper lobe on the perfusion scan cannot be entirely excluded. No prominent or multiple defects noted.  IMPRESSION: Poor ventilation both upper lobes most likely from COPD. Low probability of pulmonary embolus.   Electronically Signed   By: Marcello Moores  Register   On: 06/10/2014 14:35    ASSESSMENT / PLAN:  Dyspnea - has been ongoing for over a year now, mainly with exertion only.  To date, have not found clear etiology.  His symptoms and exam do not fit significantly decompensated CHF and his PFT's from 2015 showed decreased DLCO at 43% with mild obstruction c/w mild COPD  Although his symptoms are unlikely completely due to CHF and A.fib, these certainly do play a role.  There has been a concern for amio toxicity back in 2015 (pt has not been on amio since then); therefore, question whether symptoms could be secondary to ILD.  We also should evaluate his CHF further and rule out decompensation, PAH, etc. Recs: Obtain high resolution CT chest to evaluate for ILD. Echo to evaluate for decompensated heart failure and / or PAH. Pulmonary hygiene. Mobilize as able. Consider repeat PFT's. HRCT and Echo can both be followed up on as an outpatient. (An appointment has been made with Dr. Gwenette Greet, March 10th at 11:30AM). Nothing else from a pulmonary standpoint.  PCCM will sign off.  Please do not hesitate to call us back if we can be of any further  assistance.   Montey Hora, [facility] Pgr: [phone]  or [phone] 06/11/2014, 11:29 AM   PCCM ATTENDING: I have reviewed pt's initial presentation, consultants notes and hospital database in detail.  The above assessment and plan was formulated under my direction.  In summary: We are asked to address an isolated abnormality of decreased diffusion capacity noted on PFTs a yr ago. With totally normal spirometry and lung volumes, this raises the concern for pulm vasc dz (PE or PAH). The V/Q does not support the concern for PE.   Another possible explanation could be combined emphysema and ILD offsetting each other on the spiro and lung vols but synergistically lowering the DlCO.  REC: Echocardiogram with particular attention to PAPs HRCT (noncontrasted) to assess for ILD and  emphysema Repeat PFTs to confirm persistence of the DlCO abnormality  He is scheduled to see Dr Gwenette Greet 3/10 to F/U on the results of these studies  PCCM will sign off. Please call if we can be of further assistance  Merton Border, MD ; [facility] [phone].  After 5:30 PM or weekends, call [phone]

## 2014-06-11 NOTE — Progress Notes (Signed)
Family brought a large coffee (439ml) to pt.

## 2014-06-12 ENCOUNTER — Inpatient Hospital Stay (HOSPITAL_COMMUNITY): Payer: MEDICARE

## 2014-06-12 LAB — BASIC METABOLIC PANEL
ANION GAP: 9 (ref 5–15)
BUN: 67 mg/dL — ABNORMAL HIGH (ref 6–23)
CHLORIDE: 116 mmol/L — AB (ref 96–112)
CO2: 15 mmol/L — ABNORMAL LOW (ref 19–32)
Calcium: 9 mg/dL (ref 8.4–10.5)
Creatinine, Ser: 4.6 mg/dL — ABNORMAL HIGH (ref 0.50–1.35)
GFR calc Af Amer: 13 mL/min — ABNORMAL LOW (ref >90)
GFR calc non Af Amer: 11 mL/min — ABNORMAL LOW (ref >90)
Glucose, Bld: 97 mg/dL (ref 70–99)
POTASSIUM: 4.6 mmol/L (ref 3.5–5.1)
Sodium: 140 mmol/L (ref 135–145)

## 2014-06-12 LAB — PULMONARY FUNCTION TEST
DL/VA % pred: 68 %
DL/VA: 3.02 ml/min/mmHg/L
DLCO COR % PRED: 55 %
DLCO COR: 15.59 ml/min/mmHg
DLCO UNC: 14.09 ml/min/mmHg
DLCO unc % pred: 49 %
FEF 25-75 PRE: 1.34 L/s
FEF2575-%PRED-PRE: 67 %
FEV1-%Pred-Pre: 90 %
FEV1-Pre: 2.47 L
FEV1FVC-%PRED-PRE: 91 %
FEV6-%PRED-PRE: 101 %
FEV6-Pre: 3.58 L
FEV6FVC-%Pred-Pre: 103 %
FVC-%Pred-Pre: 97 %
FVC-Pre: 3.69 L
PRE FEV6/FVC RATIO: 97 %
Pre FEV1/FVC ratio: 67 %
RV % pred: 83 %
RV: 1.98 L
TLC % pred: 92 %
TLC: 5.95 L

## 2014-06-12 MED ORDER — FUROSEMIDE 40 MG PO TABS: 40.0000 mg | ORAL_TABLET | Freq: Two times a day (BID) | ORAL | Status: DC

## 2014-06-12 MED ORDER — SODIUM BICARBONATE 650 MG PO TABS: 1300.0000 mg | ORAL_TABLET | Freq: Two times a day (BID) | ORAL | Status: DC

## 2014-06-12 NOTE — Progress Notes (Signed)
Patient Demographics  [name], is a 74 y.o. male, DOB - January 19, 1941, BCW:[id number]  Admit date - 06/09/2014   Admitting Physician Berle Mull, MD  Outpatient Primary MD for the patient is [name], MD  LOS - 3   Chief Complaint  Patient presents with  . Shortness of Breath        Subjective:   [name] today has, No headache, No chest pain, No abdominal pain - No Nausea, No new weakness tingling or numbness, No Cough - Mild SOB.    Assessment & Plan    1. Persistent shortness of breath. Reason unclear. Exam not consistent with large fluid overload or severe acute on chronic CHF diastolic CHF with EF 03% on recent echo. Continue gentle diuresis for now. Noted his PFTs from 1 year ago which showed severely reduced DLCO, seen by pulmonary new PFTs and high resolution CT both point towards advanced undiagnosed emphysema. He is much better with diuresis. Await new echo.   2. Underlying moderate to severe aortic stenosis. Valve gradient currently is 1.2 cms. Outpatient cardiology follow-up. Follows with Dr. Radford Pax.   3. Hypertension hypertension. On diltiazem beta blocker stable.   4. Acute renal failure with chronic Kidney disease stage V - beaseline creatinine around 4.5, monitor gentle diuresis.   5. Atrial fibrillation. Continue Cardizem and Coumadin monitor.   6. Metabolic acidosis. Due to comminution of chronic kidney disease stage V along with diarrhea. Anion gap is 11. Improving on oral bicarbonate.   7. Hypokalemia. Replaced. Monitor    Code Status: Full  Family Communication: None  Disposition Plan: Home   Procedures   Recent TTE  - Left ventricle: The cavity size was normal. There was mild concentric hypertrophy. Systolic function was normal.  Theestimated ejection fraction was in the range of 55% to 60%. Wallmotion was normal; there were no regional wall motionabnormalities. Features are consistent with a pseudonormal leftventricular filling pattern, with concomitant abnormal relaxationand increased filling pressure (grade 2 diastolic dysfunction). - Aortic valve: There was mild to moderate stenosis. There wastrivial regurgitation. There was no significant perivalvularregurgitation. Valve area (VTI): 1.45 cm^2. Valve area (Vmax): 1.22 cm^2. - Mitral valve: Severely calcified annulus. The findings areconsistent with trivial stenosis. There was mild regurgitation.Valve area by pressure half-time: 2.16 cm^2. Valve area bycontinuity equation (using LVOT flow): 1.41 cm^2. - Left atrium: The atrium was severely dilated. - Right atrium: The atrium was mildly dilated.   VQ Low probability for PE, ventilation defect in both upper lung fields.   Lower ext [place] - Preliminary findings = Negative for DVT.   Echogram   High-resolution CT chest   Consults   pulmonary   Medications  Scheduled Meds: . calcitRIOL  0.25 mcg Oral QODAY  . calcitRIOL  0.5 mcg Oral QODAY  . diltiazem  180 mg Oral BID  . metoprolol succinate  50 mg Oral BID  . sodium bicarbonate  1,300 mg Oral TID  . sodium chloride  3 mL Intravenous Q12H  . warfarin  2.5 mg Oral q1800  . Warfarin - Pharmacist Dosing Inpatient   Does not apply q1800   Continuous Infusions:  PRN Meds:.acetaminophen **OR** acetaminophen, bismuth subsalicylate, ondansetron **OR** ondansetron (ZOFRAN) IV  DVT Prophylaxis  Coaumdin  Lab Results  Component Value Date   INR 2.53* 06/11/2014   INR 2.88* 06/10/2014   INR 2.63* 06/09/2014     Lab Results  Component Value Date   PLT 202 06/10/2014    Antibiotics     Anti-infectives    None          Objective:   Filed Vitals:   06/11/14 2134 06/12/14 0514 06/12/14 0943 06/12/14 1355  BP: 163/89 157/85 137/69  143/83  Pulse: 82 79 79 78  Temp: 98.1 F (36.7 C) 97.9 F (36.6 C)  97.7 F (36.5 C)  TempSrc: Oral Oral  Oral  Resp: 18 18  20   Height:      Weight:  80.151 kg (176 lb 11.2 oz)    SpO2: 99% 95%  100%    Wt Readings from Last 3 Encounters:  06/12/14 80.151 kg (176 lb 11.2 oz)  06/03/14 84.46 kg (186 lb 3.2 oz)  06/02/14 83.915 kg (185 lb)     Intake/Output Summary (Last 24 hours) at 06/12/14 1725 Last data filed at 06/12/14 1346  Gross per 24 hour  Intake   4143 ml  Output   3075 ml  Net   1068 ml     Physical Exam  Awake Alert, Oriented X 3, No new F.N deficits, Normal affect [place].AT,PERRAL Supple Neck,No JVD, No cervical lymphadenopathy appriciated.  Symmetrical Chest wall movement, Good air movement bilaterally, CTAB RRR,No Gallops,Rubs , 2 aortic systolic murmur, No Parasternal Heave +ve B.Sounds, Abd Soft, No tenderness, No organomegaly appriciated, No rebound - guarding or rigidity. No Cyanosis, Clubbing or edema, No new Rash or bruise      Data Review   Micro Results Recent Results (from the past 240 hour(s))  Clostridium Difficile by PCR     Status: None   Collection Time: 06/10/14  1:24 PM  Result Value Ref Range Status   C difficile by pcr NEGATIVE NEGATIVE Final    Radiology Reports Dg Chest 2 View  06/09/2014   CLINICAL DATA:  Shortness of breath for several weeks.  EXAM: CHEST  2 VIEW  COMPARISON:  May 20, 2014.  FINDINGS: Stable cardiomediastinal silhouette. Stable minimal bilateral pleural effusions are noted. No pneumothorax is noted. Stable mild central pulmonary vascular congestion is noted. Probable mild bibasilar interstitial edema is noted. Bony thorax appears intact.  IMPRESSION: Stable cardiomegaly and probable mild bibasilar pulmonary edema. Stable minimal bilateral pleural effusions.   Electronically Signed   By: Marijo Conception, M.D.   On: 06/09/2014 19:23   Dg Chest 2 View  05/20/2014   CLINICAL DATA:  Short of breath, cough for  several weeks  EXAM: CHEST  2 VIEW  COMPARISON:  Radiograph 04/29/2014  FINDINGS: Normal cardiac silhouette. The bilateral pleural effusions which have improved on the left compared to prior. There is interstitial edema pattern similar prior. No focal consolidation.  IMPRESSION: 1. Persistent but improved bilateral pleural effusions. 2. Interstitial edema pattern.   Electronically Signed   By: Suzy Bouchard M.D.   On: 05/20/2014 18:00   Ct Chest High Resolution  06/12/2014   CLINICAL DATA:  Chronic shortness of breath. History of atrial fibrillation. Aortic valve stenosis. Chronic kidney disease. Rule out interstitial lung disease. Ex-smoker with 2 pack per day a/66 pack year history. Quit in 1985.  EXAM: CT CHEST WITHOUT CONTRAST  TECHNIQUE: Multidetector CT imaging of the chest was performed following the standard protocol without intravenous contrast. High resolution imaging of the lungs, as well as inspiratory and expiratory imaging, was  performed.  COMPARISON:  Chest radiographs, including 06/09/2014.  No prior CT.  FINDINGS: Mediastinum/Nodes: Aortic and branch vessel atherosclerosis. Mild cardiomegaly with coronary artery atherosclerosis. Aortic valvular calcifications, consistent with the clinical history of stenosis.  Pulmonary arteries enlarged, with the outflow tract measuring 3.5 cm.  Upper normal to minimally enlarged precarinal node 11 mm on image 25. Hilar regions poorly evaluated without intravenous contrast.  Lungs/Pleura: Moderate to marked centrilobular emphysema. 7 mm left upper lobe pulmonary nodule image 17.  A left lower lobe pulmonary nodule measures 4 mm on image 46. More anterior left lower lobe nodule also measures 4 mm on image 44. No lobar consolidation.  High-resolution images demonstrate the above findings, without other interstitial lung disease. No micro nodularity, bronchiectasis, honeycombing, or subpleural reticulation. Expiratory images demonstrate no significant air  trapping.  Small bilateral pleural effusions.  Upper abdomen: Splenule. Normal imaged portions of the liver, stomach, pancreas, right adrenal gland. Mild left adrenal thickening. Advanced abdominal aortic and branch vessel atherosclerosis.  Musculoskeletal: Moderate thoracic spondylosis.  IMPRESSION: 1. Moderate centrilobular emphysema. No other evidence of interstitial lung disease. 2. Pulmonary nodules up to 7 mm. Per consensus criteria, this warrants followup at 3-6 months to confirm stability. This recommendation follows the consensus statement: Guidelines for Management of Small Pulmonary Nodules Detected on CT Scans: A Statement from the Fleischner Society as published in Radiology 2005; 237:395-400. 3. Cardiomegaly with advanced atherosclerosis, including within the coronary arteries. Small bilateral pleural effusions which could represent a component of fluid overload. 4. Mild mediastinal adenopathy. Likely reactive and/or related to congestive heart failure. Recommend attention on follow-up. 5. Pulmonary artery enlargement suggests pulmonary arterial hypertension.   Electronically Signed   By: Abigail Miyamoto M.D.   On: 06/12/2014 11:25   Nm Pulmonary Perf And Vent  06/10/2014   CLINICAL DATA:  Shortness of breath.  EXAM: NUCLEAR MEDICINE VENTILATION - PERFUSION LUNG SCAN  TECHNIQUE: Ventilation images were obtained in multiple projections using inhaled aerosol technetium 99 M DTPA. Perfusion images were obtained in multiple projections after intravenous injection of Tc-56m MAA.  RADIOPHARMACEUTICALS:  40.0 mCi Tc-64m DTPA aerosol and 6.0 mCi Tc-71m MAA  COMPARISON:  Chest x-ray 06/09/2014.  FINDINGS: Ventilation: Poor ventilation upper lungs suggesting COPD.  Perfusion: Tiny subsegmental defect right upper lobe on the perfusion scan cannot be entirely excluded. No prominent or multiple defects noted.  IMPRESSION: Poor ventilation both upper lobes most likely from COPD. Low probability of pulmonary embolus.    Electronically Signed   By: Marcello Moores  Register   On: 06/10/2014 14:35     CBC  Recent Labs Lab 06/09/14 1641 06/09/14 1852 06/10/14 0310  WBC 10.6*  --  9.4  HGB 11.6*  --  10.9*  HCT 35.3*  --  33.3*  PLT 214  --  202  MCV 94.9  --  94.3  MCH 31.2  --  30.9  MCHC 32.9  --  32.7  RDW 16.4*  --  16.5*  LYMPHSABS  --  1.7 1.5  MONOABS  --  0.8 0.8  EOSABS  --  0.3 0.4  BASOSABS  --  0.0 0.0    Chemistries   Recent Labs Lab 06/09/14 1641 06/10/14 0310 06/11/14 0457 06/12/14 0416  NA 139 139 139 140  K 4.1 3.7 3.4* 4.6  CL 117* 116* 116* 116*  CO2 11* 12* 15* 15*  GLUCOSE 102* 97 99 97  BUN 91* 85* 74* 67*  CREATININE 5.48* 5.14* 4.58* 4.60*  CALCIUM 8.9 8.9 8.4 9.0  AST  --  30  --   --   ALT  --  48  --   --   ALKPHOS  --  47  --   --   BILITOT  --  0.7  --   --    ------------------------------------------------------------------------------------------------------------------ estimated creatinine clearance is 14.1 mL/min (by C-G formula based on Cr of 4.6). ------------------------------------------------------------------------------------------------------------------ No results for input(s): HGBA1C in the last 72 hours. ------------------------------------------------------------------------------------------------------------------ No results for input(s): CHOL, HDL, LDLCALC, TRIG, CHOLHDL, LDLDIRECT in the last 72 hours. ------------------------------------------------------------------------------------------------------------------ No results for input(s): TSH, T4TOTAL, T3FREE, THYROIDAB in the last 72 hours.  Invalid input(s): FREET3 ------------------------------------------------------------------------------------------------------------------ No results for input(s): VITAMINB12, FOLATE, FERRITIN, TIBC, IRON, RETICCTPCT in the last 72 hours.  Coagulation profile  Recent Labs Lab 06/09/14 1834 06/10/14 0310 06/11/14 0457  INR 2.63* 2.88*  2.53*    No results for input(s): DDIMER in the last 72 hours.  Cardiac Enzymes No results for input(s): CKMB, TROPONINI, MYOGLOBIN in the last 168 hours.  Invalid input(s): CK ------------------------------------------------------------------------------------------------------------------ Invalid input(s): POCBNP     Time Spent in minutes  35   Lala Lund K M.D on 06/12/2014 at 5:25 PM  Between 7am to 7pm - Pager - [phone]  After 7pm go to [email or web address] - [facility] Office  [phone]

## 2014-06-12 NOTE — Discharge Instructions (Signed)
Follow with Primary MD [name], MD and your Kidney MD in 3-4 days   Get CBC, CMP, INR,  2 view Chest X ray checked  by Primary MD next visit.    Activity: As tolerated with Full fall precautions use walker/cane & assistance as needed   Disposition Home     Diet: Heart Healthy ,Check your Weight same time everyday, if you gain over 2 pounds, or you develop in leg swelling, experience more shortness of breath or chest pain, call your Primary MD immediately. Follow Cardiac Low Salt Diet and 1.5 lit/day fluid restriction.   On your next visit with your primary care physician please Get Medicines reviewed and adjusted.   Please request your Prim.MD to go over all Hospital Tests and Procedure/Radiological results at the follow up, please get all Hospital records sent to your Prim MD by signing hospital release before you go home.   If you experience worsening of your admission symptoms, develop shortness of breath, life threatening emergency, suicidal or homicidal thoughts you must seek medical attention immediately by calling 911 or calling your MD immediately  if symptoms less severe.  You Must read complete instructions/literature along with all the possible adverse reactions/side effects for all the Medicines you take and that have been prescribed to you. Take any new Medicines after you have completely understood and accpet all the possible adverse reactions/side effects.   Do not drive, operating heavy machinery, perform activities at heights, swimming or participation in water activities or provide baby sitting services if your were admitted for syncope or siezures until you have seen by Primary MD or a Neurologist and advised to do so again.  Do not drive when taking Pain medications.    Do not take more than prescribed Pain, Sleep and Anxiety Medications  Special Instructions: If you have smoked or chewed Tobacco  in the last 2 yrs please stop smoking, stop any regular  Alcohol  and or any Recreational drug use.  Wear Seat belts while driving.   Please note  You were cared for by a hospitalist during your hospital stay. If you have any questions about your discharge medications or the care you received while you were in the hospital after you are discharged, you can call the unit and asked to speak with the hospitalist on call if the hospitalist that took care of you is not available. Once you are discharged, your primary care physician will handle any further medical issues. Please note that NO REFILLS for any discharge medications will be authorized once you are discharged, as it is imperative that you return to your primary care physician (or establish a relationship with a primary care physician if you do not have one) for your aftercare needs so that they can reassess your need for medications and monitor your lab values.

## 2014-06-12 NOTE — Discharge Summary (Signed)
[name], is a 74 y.o. male  DOB 1941-01-26  MRN [id number].  Admission date:  06/09/2014  Admitting Physician  Berle Mull, MD  Discharge Date:  06/13/2014   Primary MD  [name], MD  Recommendations for primary care physician for things to follow:   Follow weight, BMP diuretic dose closely.  He must follow with pulmonary and cardiology within 1-2 weeks.   Admission Diagnosis  Diastolic congestive heart failure, unspecified congestive heart failure chronicity [I50.30]   Discharge Diagnosis  Diastolic congestive heart failure, unspecified congestive heart failure chronicity [I50.30]     Principal Problem:   Acute on chronic diastolic CHF (congestive heart failure) Active Problems:   Essential hypertension   Aortic stenosis, moderate   Chronic kidney disease, stage V   Metabolic acidosis      Past Medical History  Diagnosis Date  . Paroxysmal atrial fibrillation     a. cardioversion 11/21/10 - Dr Radford Pax. b. 05/2013 - experiencing more SOB. 24-hr holter showed avg HR 49, max 82, min 38bpm. ETT showed baseline junctional bradycardia, HR incr only to 90bpm with drop in BP. Saw EP - amiodarone reduced and eventually d/c'd with improvement in dyspnea.  . Hypertension   . Warfarin anticoagulation   . CKD (chronic kidney disease), stage V     a. Followed by Dr. Jimmy Footman. b. IV fistula placed 04/17/14.  . Bladder cancer     a. 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  . Aortic valve stenosis     a. 10/2013 - mild to moderate stenosis by echo.  . Orthostatic hypotension   . Diastolic dysfunction   . Arthritis   . Self-catheterizes urinary bladder   . Junctional bradycardia   . NSVT (nonsustained ventricular tachycardia)     a. By holter 01/2014.  [name] Shortness of breath  dyspnea     Past Surgical History  Procedure Laterality Date  . Cardioversion  11/21/2010  . Cystectomy    . Cholecystectomy    . Cataract extraction w/ intraocular lens  implant, bilateral    . Colonoscopy    . Av fistula placement Left 04/17/2014    Procedure: ARTERIOVENOUS (AV) FISTULA CREATION- LEFT UPPER ARM ;  Surgeon: Mal [name], MD;  Location: [facility];  Service: Vascular;  Laterality: Left;       History of present illness and  Hospital Course:     Kindly see H&P for history of present illness and admission details, please review complete Labs, Consult reports and Test reports for all details in brief  HPI  from the history and physical done on the day of admission  [name] is a 74 y.o. male with Past medical history of A. fib on Coumadin, hypertension, chronic kidney disease with AV fistula, bladder cancer, mild to moderate aortic stenosis, chronic diastolic dysfunction, NSVT. The patient is presenting with complaints of progressively worsening shortness of breath. Patient was recently hospitalized for acute on chronic CHF and was discharged on 05/22/2014. His bicarbonate tablets were  on hold and he was discharged on 60 mg of Lasix. Patient was reevaluated by cardiology 6 days ago at which time his Cardizem dose was increased. Patient mentions he has been weighing himself regularly on a daily basis and felt he has gained 6 pounds in 2 days and has increased his Lasix dose on Sunday, despite this his symptoms are not improving. He also had an episode of diarrhea over the weekend without any blood or nausea or vomiting or abdominal pain. Patient also becoming more short of breath on exertion, and today was even short of breath at rest. He has also noted some increased swelling in his legs. Denies any leg tenderness. Denies any fever or chills denies any chest pain chest heaviness chest tightness. He has a cough with clear sputum. He is compliant with all his  medication. Although I do not have any documentation patient reported as well as ER physician reported that the patient was seen by his nephrology and was recommended to restart his bicarbonate as well as given 60 mg IV Lasix.       Hospital Course      1. Persistent shortness of breath. Reason unclear. Exam not consistent with large fluid overload or severe acute on chronic CHF diastolic CHF with EF 27% on recent echo. Continue gentle diuresis for now. Noted his PFTs from 1 year ago which showed severely reduced DLCO, seen by pulmonary, underwent PFT along with high resolution CT both suggesting undiagnosed emphysema. His problems are largely from underlying pulmonary issues with slight diastolic decompensation. He is now close to baseline with diuresis and supportive care, will be discharged home on Lasix 40 mg twice a day along with his previous cardiac medications. He will follow with both cardiology and pulmonary post discharge. Have explained to the patient his disease process in detail twice personally.   2. Underlying moderate to severe aortic stenosis. Valve gradient currently is 1.2 cms. Outpatient cardiology follow-up. Follows with Dr. Radford Pax.   3. Hypertension hypertension. On diltiazem beta blocker stable.   4. Acute renal failure with chronic Kidney disease stage V - beaseline creatinine around 4.5, close to baseline with gentle diuresis which will be continued post discharge. We'll send home on Lasix 40 mg twice a day up from 60 mg daily. Written instructions on fluid/salt restrictions provided.   5. Atrial fibrillation. Continue Cardizem and Coumadin monitor INR in the outpatient setting.   6. Metabolic acidosis. Due to comminution of chronic kidney disease stage V along with diarrhea. Anion gap is 11. Improving on oral bicarbonate. We'll discharge on oral bicarbonate which he was taking it a few weeks ago.   7. Hypokalemia. Replaced. Currently check BMP in the next 3-7  days.   Discharge Condition: Stable   Follow UP  Follow-up Information    Follow up with Kathee Delton, MD On 07/09/2014.   Specialty:  Pulmonary Disease   Why:  Your appointment is at 11:30AM   Contact information:   [place] [address] [phone]       Follow up with [name], MD. Schedule an appointment as soon as possible for a visit in 1 week.   Specialty:  Internal Medicine   Contact information:   [place] [address] [phone]       Follow up with Sueanne Margarita, MD. Schedule an appointment as soon as possible for a visit in 1 week.   Specialty:  Cardiology   Contact information:  [place] [place] [address] [phone]         Discharge Instructions  and  Discharge Medications         Discharge Instructions    Diet - low sodium heart healthy    Complete by:  As directed      Discharge instructions    Complete by:  As directed   Follow with Primary MD [name], MD and your Kidney MD in 3-4 days   Get CBC, CMP, INR,  2 view Chest X ray checked  by Primary MD next visit.    Activity: As tolerated with Full fall precautions use walker/cane & assistance as needed   Disposition Home     Diet: Heart Healthy , Check your Weight same time everyday, if you gain over 2 pounds, or you develop in leg swelling, experience more shortness of breath or chest pain, call your Primary MD immediately. Follow Cardiac Low Salt Diet and 1.5 lit/day fluid restriction.   On your next visit with your primary care physician please Get Medicines reviewed and adjusted.   Please request your Prim.MD to go over all Hospital Tests and Procedure/Radiological results at the follow up, please get all Hospital records sent to your Prim MD by signing hospital release before you go home.   If you experience worsening of your admission symptoms, develop shortness of breath, life  threatening emergency, suicidal or homicidal thoughts you must seek medical attention immediately by calling 911 or calling your MD immediately  if symptoms less severe.  You Must read complete instructions/literature along with all the possible adverse reactions/side effects for all the Medicines you take and that have been prescribed to you. Take any new Medicines after you have completely understood and accpet all the possible adverse reactions/side effects.   Do not drive, operating heavy machinery, perform activities at heights, swimming or participation in water activities or provide baby sitting services if your were admitted for syncope or siezures until you have seen by Primary MD or a Neurologist and advised to do so again.  Do not drive when taking Pain medications.    Do not take more than prescribed Pain, Sleep and Anxiety Medications  Special Instructions: If you have smoked or chewed Tobacco  in the last 2 yrs please stop smoking, stop any regular Alcohol  and or any Recreational drug use.  Wear Seat belts while driving.   Please note  You were cared for by a hospitalist during your hospital stay. If you have any questions about your discharge medications or the care you received while you were in the hospital after you are discharged, you can call the unit and asked to speak with the hospitalist on call if the hospitalist that took care of you is not available. Once you are discharged, your primary care physician will handle any further medical issues. Please note that NO REFILLS for any discharge medications will be authorized once you are discharged, as it is imperative that you return to your primary care physician (or establish a relationship with a primary care physician if you do not have one) for your aftercare needs so that they can reassess your need for medications and monitor your lab values.     Discharge patient    Complete by:  As directed      Increase activity  slowly    Complete by:  As directed             Medication List  TAKE these medications        bismuth subsalicylate 062 MG chewable tablet  Commonly known as:  PEPTO BISMOL  Chew 2 tablets (524 mg total) by mouth as needed for diarrhea or loose stools.     calcitRIOL 0.25 MCG capsule  Commonly known as:  ROCALTROL  Take 1 capsule (0.25 mcg total) by mouth daily. Alternate 1 or 2 capsules daily     CENTRUM SILVER ULTRA MENS PO  Take 1 tablet by mouth daily. Take daily     Cinnamon 500 MG capsule  Take 250 mg by mouth daily.     diltiazem 180 MG 24 hr capsule  Commonly known as:  CARDIZEM CD  Take one capsule by mouth twice daily     furosemide 40 MG tablet  Commonly known as:  LASIX  Take 1 tablet (40 mg total) by mouth 2 (two) times daily.     glucosamine-chondroitin 500-400 MG tablet  Take 1 tablet by mouth daily.     guaiFENesin-dextromethorphan 100-10 MG/5ML syrup  Commonly known as:  ROBITUSSIN DM  Take 15 mLs by mouth every 4 (four) hours as needed for cough.     metoprolol succinate 50 MG 24 hr tablet  Commonly known as:  TOPROL-XL  Take 1 tablet (50 mg total) by mouth daily. Take with or immediately following a meal.     OSTEO BI-FLEX ADV TRIPLE ST PO  Take 1 tablet by mouth daily.     sodium bicarbonate 650 MG tablet  Take 2 tablets (1,300 mg total) by mouth 2 (two) times daily.     SUPER B COMPLEX PO  Take 1 capsule by mouth daily.     warfarin 5 MG tablet  Commonly known as:  COUMADIN  Take 0.5 tablets (2.5 mg total) by mouth daily. Take as directed by coumadin clinic          Diet and Activity recommendation: See Discharge Instructions above   Consults obtained - pulmonary   Major procedures and Radiology Reports - PLEASE review detailed and final reports for all details, in brief -        ------------------------------------------------------------------ Transthoracic  Echocardiography ------------------------------------------------------------------- Study Conclusions  - Left ventricle: The cavity size was normal. Wall thickness was increased in a pattern of moderate LVH. There was focal basal hypertrophy. Systolic function was normal. The estimated ejection fraction was in the range of 55% to 60%. Wall motion was normal; there were no regional wall motion abnormalities. - Aortic valve: Moderately calcified annulus. Moderately thickened, moderately calcified leaflets. There was moderate stenosis. There was mild regurgitation. Valve area (VTI): 1.5 cm^2. Valve area (Vmax): 1.35 cm^2. Valve area (Vmean): 1.54 cm^2. - Mitral valve: Moderately calcified annulus. Moderately thickened, moderately calcified leaflets . The findings are consistent with moderate stenosis. There was mild regurgitation. Valve area by continuity equation (using LVOT flow): 2.16 cm^2. - Left atrium: The atrium was severely dilated. - Right ventricle: The cavity size was mildly dilated. Systolic function was mildly reduced     Lower extremity venous duplex  Preliminary findings = Negative for DVT.   PFTs - suggest DLCO reduced at 49% of expected.   Dg Chest 2 View  06/09/2014   CLINICAL DATA:  Shortness of breath for several weeks.  EXAM: CHEST  2 VIEW  COMPARISON:  May 20, 2014.  FINDINGS: Stable cardiomediastinal silhouette. Stable minimal bilateral pleural effusions are noted. No pneumothorax is noted. Stable mild central pulmonary vascular congestion is noted. Probable mild bibasilar interstitial edema is  noted. Bony thorax appears intact.  IMPRESSION: Stable cardiomegaly and probable mild bibasilar pulmonary edema. Stable minimal bilateral pleural effusions.   Electronically Signed   By: Marijo Conception, M.D.   On: 06/09/2014 19:23   Dg Chest 2 View  05/20/2014   CLINICAL DATA:  Short of breath, cough for several weeks  EXAM: CHEST  2 VIEW   COMPARISON:  Radiograph 04/29/2014  FINDINGS: Normal cardiac silhouette. The bilateral pleural effusions which have improved on the left compared to prior. There is interstitial edema pattern similar prior. No focal consolidation.  IMPRESSION: 1. Persistent but improved bilateral pleural effusions. 2. Interstitial edema pattern.   Electronically Signed   By: Suzy Bouchard M.D.   On: 05/20/2014 18:00   Ct Chest High Resolution  06/12/2014   CLINICAL DATA:  Chronic shortness of breath. History of atrial fibrillation. Aortic valve stenosis. Chronic kidney disease. Rule out interstitial lung disease. Ex-smoker with 2 pack per day a/66 pack year history. Quit in 1985.  EXAM: CT CHEST WITHOUT CONTRAST  TECHNIQUE: Multidetector CT imaging of the chest was performed following the standard protocol without intravenous contrast. High resolution imaging of the lungs, as well as inspiratory and expiratory imaging, was performed.  COMPARISON:  Chest radiographs, including 06/09/2014.  No prior CT.  FINDINGS: Mediastinum/Nodes: Aortic and branch vessel atherosclerosis. Mild cardiomegaly with coronary artery atherosclerosis. Aortic valvular calcifications, consistent with the clinical history of stenosis.  Pulmonary arteries enlarged, with the outflow tract measuring 3.5 cm.  Upper normal to minimally enlarged precarinal node 11 mm on image 25. Hilar regions poorly evaluated without intravenous contrast.  Lungs/Pleura: Moderate to marked centrilobular emphysema. 7 mm left upper lobe pulmonary nodule image 17.  A left lower lobe pulmonary nodule measures 4 mm on image 46. More anterior left lower lobe nodule also measures 4 mm on image 44. No lobar consolidation.  High-resolution images demonstrate the above findings, without other interstitial lung disease. No micro nodularity, bronchiectasis, honeycombing, or subpleural reticulation. Expiratory images demonstrate no significant air trapping.  Small bilateral pleural  effusions.  Upper abdomen: Splenule. Normal imaged portions of the liver, stomach, pancreas, right adrenal gland. Mild left adrenal thickening. Advanced abdominal aortic and branch vessel atherosclerosis.  Musculoskeletal: Moderate thoracic spondylosis.  IMPRESSION: 1. Moderate centrilobular emphysema. No other evidence of interstitial lung disease. 2. Pulmonary nodules up to 7 mm. Per consensus criteria, this warrants followup at 3-6 months to confirm stability. This recommendation follows the consensus statement: Guidelines for Management of Small Pulmonary Nodules Detected on CT Scans: A Statement from the Fleischner Society as published in Radiology 2005; 237:395-400. 3. Cardiomegaly with advanced atherosclerosis, including within the coronary arteries. Small bilateral pleural effusions which could represent a component of fluid overload. 4. Mild mediastinal adenopathy. Likely reactive and/or related to congestive heart failure. Recommend attention on follow-up. 5. Pulmonary artery enlargement suggests pulmonary arterial hypertension.   Electronically Signed   By: Abigail Miyamoto M.D.   On: 06/12/2014 11:25   Nm Pulmonary Perf And Vent  06/10/2014   CLINICAL DATA:  Shortness of breath.  EXAM: NUCLEAR MEDICINE VENTILATION - PERFUSION LUNG SCAN  TECHNIQUE: Ventilation images were obtained in multiple projections using inhaled aerosol technetium 99 M DTPA. Perfusion images were obtained in multiple projections after intravenous injection of Tc-84m MAA.  RADIOPHARMACEUTICALS:  40.0 mCi Tc-26m DTPA aerosol and 6.0 mCi Tc-45m MAA  COMPARISON:  Chest x-ray 06/09/2014.  FINDINGS: Ventilation: Poor ventilation upper lungs suggesting COPD.  Perfusion: Tiny subsegmental defect right upper lobe on the  perfusion scan cannot be entirely excluded. No prominent or multiple defects noted.  IMPRESSION: Poor ventilation both upper lobes most likely from COPD. Low probability of pulmonary embolus.   Electronically Signed   By:  Marcello Moores  Register   On: 06/10/2014 14:35    Micro Results      Recent Results (from the past 240 hour(s))  Clostridium Difficile by PCR     Status: None   Collection Time: 06/10/14  1:24 PM  Result Value Ref Range Status   C difficile by pcr NEGATIVE NEGATIVE Final       Today   Subjective:   [name] today has no headache,no chest abdominal pain,no new weakness tingling or numbness, feels much better wants to go home today.    Objective:   Blood pressure 141/85, pulse 71, temperature 97.5 F (36.4 C), temperature source Oral, resp. rate 18, height 5\' 9"  (1.753 m), weight 80.513 kg (177 lb 8 oz), SpO2 100 %.   Intake/Output Summary (Last 24 hours) at 06/13/14 1121 Last data filed at 06/13/14 1116  Gross per 24 hour  Intake   1480 ml  Output   2950 ml  Net  -1470 ml    Exam Awake Alert, Oriented x 3, No new F.N deficits, Normal affect [place].AT,PERRAL Supple Neck,No JVD, No cervical lymphadenopathy appriciated.  Symmetrical Chest wall movement, Good air movement bilaterally, CTAB RRR,No Gallops,Rubs or new Murmurs, No Parasternal Heave +ve B.Sounds, Abd Soft, Non tender, No organomegaly appriciated, No rebound -guarding or rigidity. No Cyanosis, Clubbing or edema, No new Rash or bruise  Data Review   CBC w Diff:  Lab Results  Component Value Date   WBC 9.4 06/10/2014   HGB 10.9* 06/10/2014   HCT 33.3* 06/10/2014   PLT 202 06/10/2014   LYMPHOPCT 16 06/10/2014   MONOPCT 8 06/10/2014   EOSPCT 4 06/10/2014   BASOPCT 0 06/10/2014    CMP:  Lab Results  Component Value Date   NA 139 06/13/2014   K 4.4 06/13/2014   CL 114* 06/13/2014   CO2 14* 06/13/2014   BUN 62* 06/13/2014   CREATININE 3.85* 06/13/2014   PROT 6.0 06/10/2014   ALBUMIN 3.4* 06/10/2014   BILITOT 0.7 06/10/2014   ALKPHOS 47 06/10/2014   AST 30 06/10/2014   ALT 48 06/10/2014  .   Total Time in preparing paper work, data evaluation and todays exam - 35 minutes  Thurnell Lose M.D  on 06/13/2014 at 11:21 AM  [facility] Office  [phone]

## 2014-06-13 DIAGNOSIS — I509 Heart failure, unspecified: Secondary | ICD-10-CM

## 2014-06-13 LAB — BASIC METABOLIC PANEL
Anion gap: 11 (ref 5–15)
BUN: 62 mg/dL — ABNORMAL HIGH (ref 6–23)
CALCIUM: 8.5 mg/dL (ref 8.4–10.5)
CHLORIDE: 114 mmol/L — AB (ref 96–112)
CO2: 14 mmol/L — ABNORMAL LOW (ref 19–32)
CREATININE: 3.85 mg/dL — AB (ref 0.50–1.35)
GFR calc Af Amer: 16 mL/min — ABNORMAL LOW (ref >90)
GFR, EST NON AFRICAN AMERICAN: 14 mL/min — AB (ref >90)
GLUCOSE: 96 mg/dL (ref 70–99)
Potassium: 4.4 mmol/L (ref 3.5–5.1)
Sodium: 139 mmol/L (ref 135–145)

## 2014-06-13 LAB — PROTIME-INR
INR: 2.03 — AB (ref 0.00–1.49)
Prothrombin Time: 23.1 seconds — ABNORMAL HIGH (ref 11.6–15.2)

## 2014-06-13 MED ORDER — WARFARIN SODIUM 5 MG PO TABS
5.0000 mg | ORAL_TABLET | Freq: Once | ORAL | Status: DC
  Filled 2014-06-13: qty 1

## 2014-06-13 MED ORDER — WARFARIN SODIUM 2.5 MG PO TABS: 2.5000 mg | ORAL_TABLET | Freq: Every day | ORAL | Status: DC

## 2014-06-13 NOTE — Progress Notes (Signed)
DC IV, DC Tele, DC Home. Discharge instructions and home medications discussed with patient and patient's wife. Patient and wife denied any questions or concerns at this time. Patient leaving unit via wheelchair and appears in no acute distress. 

## 2014-06-13 NOTE — Progress Notes (Signed)
  Echocardiogram 2D Echocardiogram has been performed.  Bobbye Charleston 06/13/2014, 9:22 AM

## 2014-06-13 NOTE — Progress Notes (Signed)
[facility] for Coumadin Indication: atrial fibrillation  Allergies  Allergen Reactions  . Amiodarone Other (See Comments)    Severely reduced DLCO  . Oxycodone Hcl Rash and Other (See Comments)    Rash and itching    Patient Measurements: Height: 5\' 9"  (175.3 cm) Weight: 177 lb 8 oz (80.513 kg) (scale b) IBW/kg (Calculated) : 70.7  Vital Signs: Temp: 97.5 F (36.4 C) (02/13 0504) Temp Source: Oral (02/13 0504) BP: 141/85 mmHg (02/13 1015) Pulse Rate: 71 (02/13 1015)  Labs:  Recent Labs  06/11/14 0457 06/12/14 0416 06/13/14 0409 06/13/14 1026  LABPROT 27.4*  --   --  23.1*  INR 2.53*  --   --  2.03*  CREATININE 4.58* 4.60* 3.85*  --     Estimated Creatinine Clearance: 16.8 mL/min (by C-G formula based on Cr of 3.85).   Assessment: 74 yo male admitted on 06/09/2014 with SOB/CHF, h/o Afib (CHADSVASc 3), to continue Coumadin.  INR remains therapeutic at low end of goal range at 2.03 with trend down from admission INR of 2.63 on home regimen. Some of this decrease could be due to changes in diet since admission (?higher intake of vitamin K rich foods). On 2/9 Hg 10.9, pltc 202.  No bleeding reported.  VQ scan negative for PE. Dopplers negative for DVT.   Home coumadin: 2.5 mg daily with last dose 06/09/14 (Last coumadin clinic INR therapeutic at 2.0 on 06/03/14)  Goal of Therapy:  INR 2-3   Plan:  - Coumadin 5 mg PO x 1 tonight to maintain in goal range then continue with home dose of 2.5 mg PO daily   - INR on TTS - Monitor CBC and for s/s bleeding  Zykeria Laguardia K. Velva Harman, PharmD Clinical Pharmacist - Resident Pager: [phone] Pharmacy: [phone] 06/13/2014 11:57 AM

## 2014-06-18 ENCOUNTER — Ambulatory Visit (INDEPENDENT_AMBULATORY_CARE_PROVIDER_SITE_OTHER): Payer: MEDICARE

## 2014-06-18 DIAGNOSIS — I4891 Unspecified atrial fibrillation: Secondary | ICD-10-CM

## 2014-06-18 LAB — POCT INR: INR: 1.9

## 2014-06-19 ENCOUNTER — Ambulatory Visit (INDEPENDENT_AMBULATORY_CARE_PROVIDER_SITE_OTHER): Payer: MEDICARE | Admitting: Cardiology

## 2014-06-19 ENCOUNTER — Encounter: Payer: Self-pay | Admitting: Cardiology

## 2014-06-19 VITALS — BP 110/80 | HR 80 | Ht 67.0 in | Wt 177.4 lb

## 2014-06-19 DIAGNOSIS — I472 Ventricular tachycardia: Secondary | ICD-10-CM

## 2014-06-19 DIAGNOSIS — I5032 Chronic diastolic (congestive) heart failure: Secondary | ICD-10-CM

## 2014-06-19 DIAGNOSIS — I4891 Unspecified atrial fibrillation: Secondary | ICD-10-CM | POA: Insufficient documentation

## 2014-06-19 DIAGNOSIS — I350 Nonrheumatic aortic (valve) stenosis: Secondary | ICD-10-CM

## 2014-06-19 DIAGNOSIS — I482 Chronic atrial fibrillation, unspecified: Secondary | ICD-10-CM

## 2014-06-19 DIAGNOSIS — N185 Chronic kidney disease, stage 5: Secondary | ICD-10-CM

## 2014-06-19 DIAGNOSIS — I4729 Other ventricular tachycardia: Secondary | ICD-10-CM

## 2014-06-19 DIAGNOSIS — R001 Bradycardia, unspecified: Secondary | ICD-10-CM

## 2014-06-19 NOTE — Progress Notes (Signed)
Cardiology Office Note   Date:  06/19/2014   ID:  [name] [birth year], MRN [id number]  PCP:  [name], MD  Cardiologist:   Sueanne Margarita, MD   No chief complaint on file.     History of Present Illness: [name] is a 74 y.o. male with a history of atrial fibrillation, HTN, HOCM, orthostatic hypotension, stage 5 CKD and moderate   AS. He presents today for followup. He is doing well. His SOB has resolved after stopping amiodarone.   He was hospitalized last fall with atrial fibrillation and a rapid ventricular response resulting in the development of congestive heart failure. His rate was controlled and he was diuresed. He was readmitted with worsening renal function, acute CHF with respiratory failure and high resolution chest CT showed emphysema.  He has seen Dr. Melvyn Novas a year ago for reduced DLCO on PFTs .  His amio had already been stopped.  He has been referred to pulmonary again  He returns today for follow-up.  He denies any chest pain, dizziness or syncope. Occasionally his heart will skip and he feels like he is going into afib but it never happens. He occasionally has some mild LLE edema. Since getting out of the hospital his SOB has improved and essentially resolved but he says that he is not exerting himself much.     Past Medical History  Diagnosis Date  . Paroxysmal atrial fibrillation     a. cardioversion 11/21/10 - Dr Radford Pax. b. 05/2013 - experiencing more SOB. 24-hr holter showed avg HR 49, max 82, min 38bpm. ETT showed baseline junctional bradycardia, HR incr only to 90bpm with drop in BP. Saw EP - amiodarone reduced and eventually d/c'd with improvement in dyspnea.  . Hypertension   . Warfarin anticoagulation   . CKD (chronic kidney disease), stage V     a. Followed by Dr. Jimmy Footman. b. IV fistula placed 04/17/14.  . Bladder cancer     a. 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  . Aortic valve stenosis     a.  10/2013 - mild to moderate stenosis by echo.  . Orthostatic hypotension   . Diastolic dysfunction   . Arthritis   . Self-catheterizes urinary bladder   . Junctional bradycardia   . NSVT (nonsustained ventricular tachycardia)     a. By holter 01/2014.  [name] Shortness of breath dyspnea     Past Surgical History  Procedure Laterality Date  . Cardioversion  11/21/2010  . Cystectomy    . Cholecystectomy    . Cataract extraction w/ intraocular lens  implant, bilateral    . Colonoscopy    . Av fistula placement Left 04/17/2014    Procedure: ARTERIOVENOUS (AV) FISTULA CREATION- LEFT UPPER ARM ;  Surgeon: Mal [name], MD;  Location: [facility];  Service: Vascular;  Laterality: Left;     Current Outpatient Prescriptions  Medication Sig Dispense Refill  . B Complex-C (SUPER B COMPLEX PO) Take 1 capsule by mouth daily.     [name] bismuth subsalicylate (PEPTO BISMOL) 262 MG chewable tablet Chew 2 tablets (524 mg total) by mouth as needed for diarrhea or loose stools. 30 tablet 0  . calcitRIOL (ROCALTROL) 0.25 MCG capsule Take 1 capsule (0.25 mcg total) by mouth daily. Alternate 1 or 2 capsules daily 60 capsule 1  . Cinnamon 500 MG capsule Take 250 mg by mouth daily.     [name] diltiazem (CARDIZEM CD) 180 MG 24 hr capsule Take  one capsule by mouth twice daily    . furosemide (LASIX) 40 MG tablet Take 1 tablet (40 mg total) by mouth 2 (two) times daily. 30 tablet 0  . guaiFENesin-dextromethorphan (ROBITUSSIN DM) 100-10 MG/5ML syrup Take 15 mLs by mouth every 4 (four) hours as needed for cough.    . metoprolol succinate (TOPROL-XL) 50 MG 24 hr tablet Take 1 tablet (50 mg total) by mouth daily. Take with or immediately following a meal. (Patient taking differently: Take 50 mg by mouth 2 (two) times daily. Take with or immediately following a meal.) 60 tablet 0  . Multiple Vitamins-Minerals (CENTRUM SILVER ULTRA MENS PO) Take 1 tablet by mouth daily. Take daily    . sodium bicarbonate 650 MG tablet Take 2 tablets  (1,300 mg total) by mouth 2 (two) times daily. 60 tablet 0  . warfarin (COUMADIN) 5 MG tablet Take 0.5 tablets (2.5 mg total) by mouth daily. Take as directed by coumadin clinic 90 tablet 1   No current facility-administered medications for this visit.    Allergies:   Amiodarone and Oxycodone hcl    Social History:  The patient  reports that he quit smoking about 31 years ago. His smoking use included Cigarettes. He has a 66 pack-year smoking history. He has never used smokeless tobacco. He reports that he does not drink alcohol or use illicit drugs.   Family History:  The patient's family history includes Alcohol abuse in his father; Alzheimer's disease in his sister; Arrhythmia in his father; CVA in his mother; Heart attack in his father; Heart disease in his father; Multiple sclerosis in his brother.    ROS:  Please see the history of present illness.   Otherwise, review of systems are positive for none.   All other systems are reviewed and negative.    PHYSICAL EXAM: VS:  BP 110/80 mmHg  Pulse 80  Ht 5\' 7"  (1.702 m)  Wt 177 lb 6.4 oz (80.468 kg)  BMI 27.78 kg/m2  SpO2 98% , BMI Body mass index is 27.78 kg/(m^2). GEN: Well nourished, well developed, in no acute distress HEENT: normal Neck: no JVD, carotid bruits, or masses Cardiac: RRR;  rubs, or gallops.  Trace edema, 2/6 late peaking SM at RUSB to LLSB Respiratory:  clear to auscultation bilaterally, normal work of breathing GI: soft, nontender, nondistended, + BS MS: no deformity or atrophy Skin: warm and dry, no rash Neuro:  Strength and sensation are intact Psych: euthymic mood, full affect   EKG:  EKG is not ordered today.    Recent Labs: 07/22/2013: Pro B Natriuretic peptide (BNP) 580.0* 04/28/2014: TSH 2.46 05/02/2014: Magnesium 1.8 06/09/2014: B Natriuretic Peptide 2524.7* 06/10/2014: ALT 48; Hemoglobin 10.9*; Platelets 202 06/13/2014: BUN 62*; Creatinine 3.85*; Potassium 4.4; Sodium 139    Lipid Panel No  results found for: CHOL, TRIG, HDL, CHOLHDL, VLDL, LDLCALC, LDLDIRECT    Wt Readings from Last 3 Encounters:  06/19/14 177 lb 6.4 oz (80.468 kg)  06/13/14 177 lb 8 oz (80.513 kg)  06/03/14 186 lb 3.2 oz (84.46 kg)     ASSESSMENT AND PLAN:  1. Persistent atrial fibrillation - rate controlled.  Continue CCB/BB/warfarin 2. CKD stage V with recent AV fistula placement  3. Moderate aortic stenosis - by echo 06/2014. 4. Prior h/o HOCM - not really seen on 10/2013 echo. 5. H/o junctional bradycardia - see above. Patient knows to observe closely for symptoms of low HR. 6. NSVT 7.  Chronic diastolic CHF - appears euvolemic on exam today.  I have encouraged him to try to get into a walking program and follow a 2gm sodium diet.  I have encouraged him weigh himself daily and call if weight if increases by more than 3 lbs in a day or 5lbs in a week.     Current medicines are reviewed at length with the patient today.  The patient does not have concerns regarding medicines.  The following changes have been made:  no change  Labs/ tests ordered today include: None No orders of the defined types were placed in this encounter.     Disposition:   FU with me in 3 months   Signed, Sueanne Margarita, MD  06/19/2014 11:15 AM    [facility] [place], [place], [place]  [address] Phone: [phone]; Fax: [phone]

## 2014-06-19 NOTE — Patient Instructions (Signed)
Your physician recommends that you continue on your current medications as directed. Please refer to the Current Medication list given to you today.  Your physician recommends that you schedule a follow-up appointment in: 3 months with Dr Turner 

## 2014-07-02 ENCOUNTER — Ambulatory Visit (INDEPENDENT_AMBULATORY_CARE_PROVIDER_SITE_OTHER): Payer: MEDICARE

## 2014-07-02 DIAGNOSIS — I4891 Unspecified atrial fibrillation: Secondary | ICD-10-CM

## 2014-07-02 LAB — POCT INR: INR: 3.8

## 2014-07-09 ENCOUNTER — Encounter: Payer: Self-pay | Admitting: Internal Medicine

## 2014-07-09 ENCOUNTER — Inpatient Hospital Stay: Payer: MEDICARE | Admitting: Pulmonary Disease

## 2014-07-09 ENCOUNTER — Ambulatory Visit (INDEPENDENT_AMBULATORY_CARE_PROVIDER_SITE_OTHER): Payer: MEDICARE | Admitting: Internal Medicine

## 2014-07-09 ENCOUNTER — Inpatient Hospital Stay: Payer: MEDICARE | Admitting: Internal Medicine

## 2014-07-09 VITALS — BP 146/80 | HR 77 | Ht 68.0 in | Wt 182.0 lb

## 2014-07-09 DIAGNOSIS — R0600 Dyspnea, unspecified: Secondary | ICD-10-CM

## 2014-07-09 NOTE — Progress Notes (Signed)
Subjective:    Patient ID: [name], male    DOB: Apr 19, 1941   MRN: [id number]    Brief patient profile:  55 yowm quit smoking 1985 with some cough resolved completely then onset of doe x 2011 referred Dr Loreta Ave 07/17/2013 to pulmonary clinic for sob.    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 Pulmonary Consult    Referred per Dr. Ollen Gross. Pt c/o SOB for the past 2 yrs. He gets SOB walking up steps and with minimal exertion such as making his bed.   wears out when walking flat like at [employer]/ across a big parking lot   But can treadmill #3 slow x flat x 37m, intermittent cough variably worse in winter and in am's min mucoid sputum. rec GERD  Diet        Lab Results  Component Value Date   ESRSEDRATE 61* 07/22/2013  -Amiodarone stopped 07/23/13     09/11/2013 f/u ov/Wert re: sob ? Amiodarone related Chief Complaint  Patient presents with  . Follow-up    Pt states that his breathing has improved since last visit and cough "pretty much gone". No new co's today.   Not limited by breathing from desired activities   rec Only need to return if you feel you are loosing ground with your ability to walk on your treadmill   Admission date: 06/09/2014    Discharge Date: 06/13/2014     Principal Problem:  Acute on chronic diastolic CHF (congestive heart failure)   Essential hypertension  Aortic stenosis, moderate  Chronic kidney disease, stage V  Metabolic acidosis    3/53/2992 post hosp f/u ov/Wert re: ? ILD   Chief Complaint  Patient presents with  . HFU    Pt states that his breathing is some better since hospital d/c.  He states that he gets SOB only when he has back pain.   Able to play golf again/ seeing Deterding for renal, Turner for cards/ [facility] for primary care ("he gives me all my presriptions")   No obvious day  to day or daytime variabilty or assoc chronic cough or cp or chest tightness, subjective wheeze overt sinus or hb symptoms. No unusual exp hx or h/o childhood pna/ asthma or knowledge of premature birth.  Sleeping ok without nocturnal  or early am exacerbation  of respiratory  c/o's or need for noct saba. Also denies any obvious fluctuation of symptoms with weather or environmental changes or other aggravating or alleviating factors except as outlined above   Current Medications, Allergies, Complete Past Medical History, Past Surgical History, Family History, and Social History were reviewed in [employer] record.  ROS  The following are not active complaints unless bolded sore throat, dysphagia, dental problems, itching, sneezing,  nasal congestion or excess/ purulent secretions, ear ache,   fever, chills, sweats, unintended wt loss, pleuritic or exertional cp, hemoptysis,  orthopnea pnd or leg swelling, presyncope, palpitations, heartburn, abdominal pain, anorexia, nausea, vomiting, diarrhea  or change in bowel or urinary habits, change in stools or urine, dysuria,hematuria,  rash, arthralgias, visual complaints, headache, numbness weakness or ataxia or problems with walking or coordination,  change in mood/affect or memory.               Objective:   Physical Exam  09/11/2013  178 >  07/09/2014 182  Wt Readings from Last 3 Encounters:  07/17/13 170 lb (77.111 kg)  06/30/13 178 lb 3.2 oz (80.831 kg)  05/26/13 185 lb 12.8 oz (84.278 kg)     HEENT: edentulous, nl  turbinates, and orophanx. Nl external ear canals without cough reflex   NECK :  without JVD/Nodes/TM/ nl carotid upstrokes bilaterally   LUNGS: no acc muscle use,   without cough on insp or exp maneuvers, clear to A and P   CV:  RRR  no s3 or murmur or increase in P2, no edema   ABD:  soft and nontender with nl excursion in the supine position. No bruits or organomegaly, bowel sounds nl  MS:   warm without deformities, calf tenderness, cyanosis or clubbing  SKIN: warm and dry without lesions    NEURO:  alert, approp, no deficits    CXR  07/17/2013 : No active cardiopulmonary disease. Emphysema         Assessment & Plan:

## 2014-07-09 NOTE — Patient Instructions (Signed)
Make sure that all your medications you use at home match up to the meds on our list and call us if they don't so we can correct your list   No pulmonary follow up is needed

## 2014-07-09 NOTE — Assessment & Plan Note (Signed)
-   PFT s 07/14/13  Min airflow obst, DLCO  43% corrects to 47  - 07/17/2013  Walked RA x 3 laps @ 185 ft each stopped due to end of study, lowest sat 88 but then increased before stopped -Amiodarone stopped 07/23/13  - 09/11/2013  Walked RA x 3 laps @ 185 ft each stopped due to end of study sats 94% at end, no sob - HRCT 06/11/14 neg ILD  - 07/09/2014  Walked RA x 3 laps @ 185 ft each stopped due to  End of study, nl to fast pace, no desat    I had an extended discussion with the patient reviewing all relevant studies completed to date and  lasting 15 to 20 minutes of a 25 minute visit on the following ongoing concerns:  1)  He has no sign primary ild or copd  2) when his breathing gets worse, it's usually due to chf/vol overload   3) he has renal/cards already following him, so f/u can be prn  4) Each maintenance medication was reviewed in detail including most importantly the difference between maintenance and as needed and under what circumstances the prns are to be used.  Please see instructions for details which were reviewed in writing and the patient given a copy.    5) cautioned re concept of med reconciliation as he is seeing three sets of doctors not on the same EMR so it will be up to pt to tell every doctor every medicine he's taking

## 2014-07-16 ENCOUNTER — Ambulatory Visit (INDEPENDENT_AMBULATORY_CARE_PROVIDER_SITE_OTHER): Payer: MEDICARE | Admitting: *Deleted

## 2014-07-16 DIAGNOSIS — I4891 Unspecified atrial fibrillation: Secondary | ICD-10-CM

## 2014-07-16 LAB — POCT INR: INR: 1.9

## 2014-07-30 ENCOUNTER — Ambulatory Visit (INDEPENDENT_AMBULATORY_CARE_PROVIDER_SITE_OTHER): Payer: MEDICARE | Admitting: Surgery

## 2014-07-30 DIAGNOSIS — I4891 Unspecified atrial fibrillation: Secondary | ICD-10-CM

## 2014-07-30 LAB — POCT INR: INR: 2.5

## 2014-08-20 ENCOUNTER — Ambulatory Visit (INDEPENDENT_AMBULATORY_CARE_PROVIDER_SITE_OTHER): Payer: MEDICARE | Admitting: *Deleted

## 2014-08-20 DIAGNOSIS — I4891 Unspecified atrial fibrillation: Secondary | ICD-10-CM

## 2014-08-20 LAB — POCT INR: INR: 2.1

## 2014-09-12 NOTE — Progress Notes (Signed)
Cardiology Office Note   Date:  09/14/2014   ID:  [name], [name] 1941-02-14, MRN [id number]  PCP:  [name], MD    Chief Complaint  Patient presents with  . Atrial Fibrillation  . Follow-up    hypertension  . Aortic Stenosis      History of Present Illness: [name] is a 74 y.o. male with a history of atrial fibrillation, HTN, HOCM, orthostatic hypotension, stage 5 CKD, chronic diastolic CHF and moderate AS. He presents today for followup. He is doing well. His SOB has resolved after stopping amiodarone. He was hospitalized last fall with atrial fibrillation and a rapid ventricular response resulting in the development of congestive heart failure. His rate was controlled and he was diuresed. He was readmitted with worsening renal function, acute CHF with respiratory failure and high resolution chest CT showed emphysema. he was in [facility] for CHF exacerbation twice in January and once in February.   He returns today for follow-up. He denies any chest pain, dizziness or syncope. He only has SOB unless he starts retaining too much fluid and resolves with additional lasix.  Occasionally his heart will skip and he feels like he is going into afib but it never happens. His LE edema has improved since doing a Lasix SS based on weights He weight himself daily and on average 170-172lbs.  He takes extra lasix if he gains more than 3 lbs in a day.     Past Medical History  Diagnosis Date  . Paroxysmal atrial fibrillation     a. cardioversion 11/21/10 - Dr Radford Pax. b. 05/2013 - experiencing more SOB. 24-hr holter showed avg HR 49, max 82, min 38bpm. ETT showed baseline junctional bradycardia, HR incr only to 90bpm with drop in BP. Saw EP - amiodarone reduced and eventually d/c'd with improvement in dyspnea.  . Hypertension   . Warfarin anticoagulation   . CKD (chronic kidney disease), stage V     a. Followed by Dr. Jimmy Footman. b. IV fistula placed 04/17/14.  [name] Aortic  valve stenosis     moderate by echo 06/2014  . Orthostatic hypotension   . Diastolic dysfunction   . Arthritis   . Self-catheterizes urinary bladder   . Junctional bradycardia   . NSVT (nonsustained ventricular tachycardia)     a. By holter 01/2014.  [name] Shortness of breath dyspnea   . PVD (peripheral vascular disease)   . Cardiomyopathy   . RTA (renal tubular acidosis)   . Hypomagnesemia   . Bladder cancer     a. 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  [name] A-V fistula     Past Surgical History  Procedure Laterality Date  . Cardioversion  11/21/2010  . Cystectomy    . Cholecystectomy    . Cataract extraction w/ intraocular lens  implant, bilateral    . Colonoscopy    . Av fistula placement Left 04/17/2014    Procedure: ARTERIOVENOUS (AV) FISTULA CREATION- LEFT UPPER ARM ;  Surgeon: Mal [name], MD;  Location: [facility] OR;  Service: Vascular;  Laterality: Left;  . Bladder surgery       Current Outpatient Prescriptions  Medication Sig Dispense Refill  . B Complex-C (SUPER B COMPLEX PO) Take 1 capsule by mouth daily.     [name] bismuth subsalicylate (PEPTO BISMOL) 262 MG chewable tablet Chew 2 tablets (524 mg total) by mouth as needed for diarrhea or loose stools. 30 tablet 0  . calcitRIOL (ROCALTROL) 0.25 MCG  capsule Take 1 capsule (0.25 mcg total) by mouth daily. Alternate 1 or 2 capsules daily 60 capsule 1  . diltiazem (CARDIZEM CD) 180 MG 24 hr capsule Take one capsule by mouth twice daily    . furosemide (LASIX) 40 MG tablet Take 1 tablet (40 mg total) by mouth 2 (two) times daily. 30 tablet 0  . guaiFENesin-dextromethorphan (ROBITUSSIN DM) 100-10 MG/5ML syrup Take 15 mLs by mouth every 4 (four) hours as needed for cough.    . metoprolol succinate (TOPROL-XL) 50 MG 24 hr tablet Take 1 tablet (50 mg total) by mouth daily. Take with or immediately following a meal. (Patient taking differently: Take 50 mg by mouth 2 (two) times daily. Take with or immediately following  a meal.) 60 tablet 0  . Misc Natural Products (OSTEO BI-FLEX TRIPLE STRENGTH) TABS Take 2 tablets by mouth daily.    . Multiple Vitamins-Minerals (CENTRUM SILVER ULTRA MENS PO) Take 1 tablet by mouth daily. Take daily    . sodium bicarbonate 650 MG tablet Take 2 tablets (1,300 mg total) by mouth 2 (two) times daily. 60 tablet 0  . sucroferric oxyhydroxide (VELPHORO) 500 MG chewable tablet Chew 500 mg by mouth 3 (three) times daily with meals.    . warfarin (COUMADIN) 5 MG tablet Take 0.5 tablets (2.5 mg total) by mouth daily. Take as directed by coumadin clinic 90 tablet 1   No current facility-administered medications for this visit.    Allergies:   Amiodarone and Oxycodone hcl    Social History:  The patient  reports that he quit smoking about 31 years ago. His smoking use included Cigarettes. He has a 66 pack-year smoking history. He has never used smokeless tobacco. He reports that he does not drink alcohol or use illicit drugs.   Family History:  The patient's family history includes Alcohol abuse in his father; Alzheimer's disease in his sister; Arrhythmia in his father; CVA in his mother; Heart attack in his father; Heart disease in his father; Multiple sclerosis in his brother.    ROS:  Please see the history of present illness.   Otherwise, review of systems are positive for none.   All other systems are reviewed and negative.    PHYSICAL EXAM: VS:  BP 120/84 mmHg  Pulse 75  Ht 5\' 8"  (1.727 m)  Wt 182 lb 6.4 oz (82.736 kg)  BMI 27.74 kg/m2  SpO2 95% , BMI Body mass index is 27.74 kg/(m^2). GEN: Well nourished, well developed, in no acute distress HEENT: normal Neck: no JVD, carotid bruits, or masses Cardiac: RRR; no rubs, or gallops,no edema.  2/6 SM at RUSB radiating to LLSB Respiratory:  clear to auscultation bilaterally, normal work of breathing GI: soft, nontender, nondistended, + BS MS: no deformity or atrophy Skin: warm and dry, no rash Neuro:  Strength and  sensation are intact Psych: euthymic mood, full affect   EKG:  EKG is not ordered today.    Recent Labs: 04/28/2014: TSH 2.46 05/02/2014: Magnesium 1.8 06/09/2014: B Natriuretic Peptide 2524.7* 06/10/2014: ALT 48; Hemoglobin 10.9*; Platelets 202 06/13/2014: BUN 62*; Creatinine 3.85*; Potassium 4.4; Sodium 139    Lipid Panel No results found for: CHOL, TRIG, HDL, CHOLHDL, VLDL, LDLCALC, LDLDIRECT    Wt Readings from Last 3 Encounters:  09/14/14 182 lb 6.4 oz (82.736 kg)  07/09/14 182 lb (82.555 kg)  06/19/14 177 lb 6.4 oz (80.468 kg)    ASSESSMENT AND PLAN:  1. Persistent atrial fibrillation - rate controlled. Continue CCB/BB/warfarin 2. CKD  stage V with recent AV fistula placement  3. Moderate aortic stenosis - by echo 06/2014.  Recheck 2D echo in august to assess for progression 4. Prior h/o HOCM - not really seen on 06/2014 echo. 5. H/o junctional bradycardia - see above. Patient knows to observe closely for symptoms of low HR. 6. NSVT 7. Chronic diastolic CHF - appears euvolemic on exam today. I have encouraged him to try to get into a walking program and follow a 2gm sodium diet. I have encouraged him weigh himself daily and call if weight if increases by more than 3 lbs in a day or 5lbs in a week.  8.  Moderate MS by echo 06/2014 - recheck echo in 6 months     Current medicines are reviewed at length with the patient today.  The patient does not have concerns regarding medicines.  The following changes have been made:  no change  Labs/ tests ordered today include: see above assessment and plan No orders of the defined types were placed in this encounter.     Disposition:   FU with me in 6 months   Signed, Sueanne Margarita, MD  09/14/2014 1:46 PM    [facility] [place], [place], [place]  [address] Phone: [phone]; Fax: [phone]

## 2014-09-14 ENCOUNTER — Encounter: Payer: Self-pay | Admitting: Cardiology

## 2014-09-14 ENCOUNTER — Ambulatory Visit (INDEPENDENT_AMBULATORY_CARE_PROVIDER_SITE_OTHER): Payer: MEDICARE | Admitting: Cardiology

## 2014-09-14 VITALS — BP 120/84 | HR 75 | Ht 68.0 in | Wt 182.4 lb

## 2014-09-14 DIAGNOSIS — R001 Bradycardia, unspecified: Secondary | ICD-10-CM

## 2014-09-14 DIAGNOSIS — I482 Chronic atrial fibrillation, unspecified: Secondary | ICD-10-CM

## 2014-09-14 DIAGNOSIS — I5032 Chronic diastolic (congestive) heart failure: Secondary | ICD-10-CM

## 2014-09-14 DIAGNOSIS — I421 Obstructive hypertrophic cardiomyopathy: Secondary | ICD-10-CM

## 2014-09-14 DIAGNOSIS — I10 Essential (primary) hypertension: Secondary | ICD-10-CM

## 2014-09-14 DIAGNOSIS — I350 Nonrheumatic aortic (valve) stenosis: Secondary | ICD-10-CM | POA: Diagnosis not present

## 2014-09-14 LAB — BASIC METABOLIC PANEL
BUN: 64 mg/dL — AB (ref 6–23)
CALCIUM: 10.1 mg/dL (ref 8.4–10.5)
CHLORIDE: 101 meq/L (ref 96–112)
CO2: 25 mEq/L (ref 19–32)
Creatinine, Ser: 4.91 mg/dL (ref 0.40–1.50)
GFR: 12.36 mL/min — AB (ref >60.00)
Glucose, Bld: 100 mg/dL — ABNORMAL HIGH (ref 70–99)
Potassium: 4.7 mEq/L (ref 3.5–5.1)
Sodium: 135 mEq/L (ref 135–145)

## 2014-09-14 NOTE — Patient Instructions (Signed)
Medication Instructions:  Your physician recommends that you continue on your current medications as directed. Please refer to the Current Medication list given to you today.   Labwork: TODAY: BMET  Testing/Procedures: Your physician has requested that you have an echocardiogram in AUGUST 2016. Echocardiography is a painless test that uses sound waves to create images of your heart. It provides your doctor with information about the size and shape of your heart and how well your heart's chambers and valves are working. This procedure takes approximately one hour. There are no restrictions for this procedure.   Follow-Up: Your physician wants you to follow-up in: 6 months with Dr. Radford Pax. You will receive a reminder letter in the mail two months in advance. If you don't receive a letter, please call our office to schedule the follow-up appointment.

## 2014-09-15 ENCOUNTER — Encounter: Payer: Self-pay | Admitting: Cardiology

## 2014-09-16 ENCOUNTER — Ambulatory Visit: Payer: MEDICARE | Admitting: Cardiology

## 2014-09-17 ENCOUNTER — Ambulatory Visit (INDEPENDENT_AMBULATORY_CARE_PROVIDER_SITE_OTHER): Payer: MEDICARE | Admitting: *Deleted

## 2014-09-17 DIAGNOSIS — I4891 Unspecified atrial fibrillation: Secondary | ICD-10-CM

## 2014-09-17 LAB — POCT INR: INR: 2.6

## 2014-10-13 ENCOUNTER — Other Ambulatory Visit: Payer: Self-pay | Admitting: Nephrology

## 2014-10-13 ENCOUNTER — Ambulatory Visit
Admission: RE | Admit: 2014-10-13 | Discharge: 2014-10-13 | Disposition: A | Discharge status: Home / Self Care | Payer: MEDICARE | Source: Ambulatory Visit | Attending: Nephrology | Admitting: Nephrology

## 2014-10-13 DIAGNOSIS — R053 Chronic cough: Secondary | ICD-10-CM

## 2014-10-13 DIAGNOSIS — R05 Cough: Secondary | ICD-10-CM

## 2014-10-29 ENCOUNTER — Ambulatory Visit (INDEPENDENT_AMBULATORY_CARE_PROVIDER_SITE_OTHER): Payer: MEDICARE | Admitting: *Deleted

## 2014-10-29 DIAGNOSIS — I4891 Unspecified atrial fibrillation: Secondary | ICD-10-CM | POA: Diagnosis not present

## 2014-10-29 LAB — POCT INR: INR: 3.1

## 2014-11-09 NOTE — Patient Outreach (Signed)
[facility]) Care Management  11/09/2014  BRECKYN TICAS 11-Jul-1940 [id number]   Referral from [facility], assigned Mariann Laster, RN to outreach.  Ronnell Freshwater. [place], [place] Management [place] Assistant Phone: [phone] Fax: [phone]

## 2014-11-12 ENCOUNTER — Other Ambulatory Visit: Payer: Self-pay

## 2014-11-12 NOTE — Patient Outreach (Signed)
[facility]) Care Management  11/12/2014  [name] Mar 26, 1941 [id number]  Telephonic Care Management  Referral Date:  11/09/2014 Referral Source:  MD Referral Issue:  CHF, COPD, 2 admits  Outreach Screening call #1.  Patient reached.  Patient denies having COPD and states he does not have any breathing problems.  PCP:  Dr. Shelia Media.  last appt was Sept or Oct of 2015 - sees MD yearly Also followed by [facility]:  PA Hortencia Conradi.   Getting most meds from the [facility].   Other Specialist:  Cardiologist:  Dr. Radford Pax, GI MD, Nephrologist.  Admissions:  States admitted end of December, and twice in January 2016.  Patient stated he does get mixed up with all his medications.  RN CM provided introduction to [facility] services.  Patient states he does not need or want any phone calls or home visits.  States he will let his MD straighten his meds out.  RN CM instructed patient he may contact [facility] at [phone] or contact his PCP for assistance with a [facility] referral should he change his mind.   Plan: RN CM send in-basket request [facility] Administrative Assistance:  Case status update:  Case closed:  Patient refused services. RN CM will send MD Case Closure Notification letter.    Mariann Laster, RN, BSN, [facility], CCM  [employer] [employer] Management Coordinator [phone] Office [phone] Direct [phone] Cell

## 2014-11-17 NOTE — Patient Outreach (Signed)
[facility]) Care Management  11/17/2014  [name] 1940/10/19 [id number]   Notification from Mariann Laster, RN to close case due to patient refused [place] Management services.  Ronnell Freshwater. [place], [place] Management [place] Assistant Phone: [phone] Fax: [phone]

## 2014-11-18 ENCOUNTER — Telehealth: Payer: Self-pay | Admitting: Cardiology

## 2014-11-18 NOTE — Telephone Encounter (Signed)
Patient called c/o SOB on exertion. At rest, patient is fine. But patient stood and ambulated on the phone and he was audibly SOB. He reports no swelling, weight gain, CP or dizziness. SOB with activity is only complaint. He actually has lost weight. He reports his weight at 167 lbs. Per patient, his Lasix was increased to 80 mg in AM and 40 mg in PM. Med list updated. Pt checked BP on the phone: 162/91 HR 75 and regular. Past few BPs: Sunday: sitting  163/87 HR 69; standing 170/85 HR 71 Monday: sitting  166/97 HR 71; standing 127/84 HR 74 Tuesday: sitting 154/95 HR 79' standign 138/88 HR 78  Per Ignacia Bayley, patient to be added to Flex tomorrow for SOB.  Instructed the patient to go to the ED today if SOB worsens. Patient agrees with treatment plan and confirms appointment tomorrow.

## 2014-11-18 NOTE — Telephone Encounter (Signed)
New message     Pt having problem with SOB  Pt c/o Shortness Of Breath: STAT if SOB developed within the last 24 hours or pt is noticeably SOB on the phone  1. Are you currently SOB (can you hear that pt is SOB on the phone)?No   2. How long have you been experiencing SOB? 4-5 days  3. Are you SOB when sitting or when up moving around? Moving around  4. Are you currently experiencing any other symptoms? No  Please call to discuss

## 2014-11-19 ENCOUNTER — Encounter: Payer: Self-pay | Admitting: Physician Assistant

## 2014-11-19 ENCOUNTER — Ambulatory Visit (INDEPENDENT_AMBULATORY_CARE_PROVIDER_SITE_OTHER): Payer: MEDICARE | Admitting: Physician Assistant

## 2014-11-19 VITALS — BP 145/86 | HR 78 | Ht 68.0 in | Wt 176.6 lb

## 2014-11-19 DIAGNOSIS — R0609 Other forms of dyspnea: Secondary | ICD-10-CM

## 2014-11-19 NOTE — Progress Notes (Signed)
Cardiology Office Note   Date:  11/19/2014   ID:  [name], [name] Sep 14, 1940, MRN [id number]  PCP:  [name], MD  Cardiologist:  Dr Julaine Fusi, PA-C   Chief complaint: Dyspnea on exertion  History of Present Illness: [name] is a 74 y.o. male with a history of atrial fibrillation on coumadin, HTN, HOCM, orthostatic hypotension, stage 5 CKD, chronic diastolic CHF and moderateAS.  [name] presents for evaluation of his dyspnea on exertion. He has noticed consistent dyspnea on exertion. This has worsened recently and he has problems walking 200 feet without stopping because of shortness of breath and some weakness. This is not associated with chest pain. He was evaluated by Dr. Kyung Rudd and told that it was not a structural lung problem. He has an echocardiogram ordered but it has not been performed yet. His weight has not changed recently and he is having no problems with lower extremity edema, orthopnea or PND. He is followed by Dr. Dr. Marlou Sa who manages his diuretic therapy but that has not changed recently.   Past Medical History  Diagnosis Date  . Paroxysmal atrial fibrillation     a. cardioversion 11/21/10 - Dr Radford Pax. b. 05/2013 - experiencing more SOB. 24-hr holter showed avg HR 49, max 82, min 38bpm. ETT showed baseline junctional bradycardia, HR incr only to 90bpm with drop in BP. Saw EP - amiodarone reduced and eventually d/c'd with improvement in dyspnea.  . Hypertension   . Warfarin anticoagulation   . CKD (chronic kidney disease), stage V     a. Followed by Dr. Jimmy Footman. b. IV fistula placed 04/17/14.  [name] Aortic valve stenosis     moderate by echo 06/2014  . Orthostatic hypotension   . Diastolic dysfunction   . Arthritis   . Self-catheterizes urinary bladder   . Junctional bradycardia   . NSVT (nonsustained ventricular tachycardia)     a. By holter 01/2014.  [name] Shortness of breath dyspnea   . PVD (peripheral vascular disease)     . Cardiomyopathy   . RTA (renal tubular acidosis)   . Hypomagnesemia   . Bladder cancer     a. 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  [name] A-V fistula     Past Surgical History  Procedure Laterality Date  . Cardioversion  11/21/2010  . Cystectomy    . Cholecystectomy    . Cataract extraction w/ intraocular lens  implant, bilateral    . Colonoscopy    . Av fistula placement Left 04/17/2014    Procedure: ARTERIOVENOUS (AV) FISTULA CREATION- LEFT UPPER ARM ;  Surgeon: Mal [name], MD;  Location: [facility] OR;  Service: Vascular;  Laterality: Left;  . Bladder surgery      Current Outpatient Prescriptions  Medication Sig Dispense Refill  . calcitRIOL (ROCALTROL) 0.25 MCG capsule Take 0.25 mcg by mouth daily.    [name] diltiazem (CARDIZEM CD) 180 MG 24 hr capsule Take 180 mg by mouth at bedtime.    . ferrous sulfate 325 (65 FE) MG tablet Take 325 mg by mouth daily.    . furosemide (LASIX) 40 MG tablet Take two (2) tablets (80 mg total) by mouth every morning and one (1) tablet ( 40 mg total) by mouth every evening    . guaiFENesin-dextromethorphan (ROBITUSSIN DM) 100-10 MG/5ML syrup Take 15 mLs by mouth every 4 (four) hours as needed for cough.    . Loperamide HCl (IMODIUM PO) Take 1 tablet by  mouth 2 (two) times daily.    . metoprolol succinate (TOPROL-XL) 50 MG 24 hr tablet Take 50 mg by mouth daily. Take with or immediately following a meal.    . Multiple Vitamins-Minerals (CENTRUM SILVER ULTRA MENS PO) Take 1 tablet by mouth daily. Take daily    . Pancrelipase, Lip-Prot-Amyl, (CREON) 24000 UNITS CPEP Take one (1) capsule by mouth with each meal    . PRESCRIPTION MEDICATION Take 1 tablet by mouth daily. (4-STRAIN PROBIOTIC)    . sevelamer carbonate (RENVELA) 800 MG tablet Take one (1) tablet by mouth with each meal    . sodium bicarbonate 650 MG tablet Take 2 tablets (1,300 mg total) by mouth 2 (two) times daily. 60 tablet 0  . warfarin (COUMADIN) 5 MG tablet Take 0.5  tablets (2.5 mg total) by mouth daily. Take as directed by coumadin clinic 90 tablet 1   No current facility-administered medications for this visit.    Allergies:   Amiodarone and Oxycodone hcl    Social History:  The patient  reports that he quit smoking about 31 years ago. His smoking use included Cigarettes. He has a 66 pack-year smoking history. He has never used smokeless tobacco. He reports that he does not drink alcohol or use illicit drugs.   Family History:  The patient's family history includes Alcohol abuse in his father; Alzheimer's disease in his sister; Arrhythmia in his father; CVA in his mother; Heart attack in his father; Heart disease in his father; Multiple sclerosis in his brother.    ROS:  Please see the history of present illness. All other systems are reviewed and negative.    PHYSICAL EXAM: VS:  BP 145/86 mmHg  Pulse 78  Ht 5\' 8"  (1.727 m)  Wt 176 lb 9.6 oz (80.105 kg)  BMI 26.86 kg/m2  SpO2 94% , BMI Body mass index is 26.86 kg/(m^2). GEN: Well nourished, well developed, in no acute distress HEENT: normal Neck: JVD minimal elevation, mildly positive hepatojugular reflux, no carotid bruits, or masses Cardiac: RRR; 2/6 murmurs, no rubs, or gallops,no edema  Respiratory:  clear to auscultation bilaterally, normal work of breathing GI: soft, nontender, nondistended, + BS MS: no deformity or atrophy Skin: warm and dry, no rash; multiple areas of ecchymosis secondary to Coumadin Neuro:  Strength and sensation are intact Psych: euthymic mood, full affect   EKG:  EKG is not ordered today.   Recent Labs: 04/28/2014: TSH 2.46 05/02/2014: Magnesium 1.8 06/09/2014: B Natriuretic Peptide 2524.7* 06/10/2014: ALT 48; Hemoglobin 10.9*; Platelets 202 09/14/2014: BUN 64*; Creatinine, Ser 4.91*; Potassium 4.7; Sodium 135    Lipid Panel No results found for: CHOL, TRIG, HDL, CHOLHDL, VLDL, LDLCALC, LDLDIRECT   Wt Readings from Last 3 Encounters:  11/19/14 176 lb 9.6  oz (80.105 kg)  09/14/14 182 lb 6.4 oz (82.736 kg)  07/09/14 182 lb (82.555 kg)     Other studies Reviewed: Additional studies/ records that were reviewed today include: Previous office notes, Echo.  ASSESSMENT AND PLAN:  1.  Dyspnea on exertion: He ambulated around the office and became short of breath after approximately 300 feet, but his oxygen saturation stayed greater than 95%. At the echocardiogram and follow-up after that. His aortic stenosis may have worsened. He may be a candidate for TAVR.   2. Stage V CK D: The patient has a fistula in place but it is not currently being used. His labs have not been checked in over 30 days, so we will get a BMET. Follow up  with Dr. Huntley Dec  Current medicines are reviewed at length with the patient today.  The patient does not have concerns regarding medicines.  The following changes have been made:  no change  Labs/ tests ordered today include:  BMET  Disposition:   FU with Dr. Radford Pax to review the echo lab work  Signed, Lenoard Aden  11/19/2014 5:21 PM    [address], [place], [place]  [address] Phone: [phone]; Fax: [phone]

## 2014-11-19 NOTE — Patient Instructions (Addendum)
Medication Instructions:  Your physician recommends that you continue on your current medications as directed. Please refer to the Current Medication list given to you today.  Labwork: NONE  Testing/Procedures: NONE  Follow-Up: Your physician wants you to follow-up with Dr. Radford Pax after your echocardiogram on 12/03/14.  Any Other Special Instructions Will Be Listed Below (If Applicable).

## 2014-11-20 ENCOUNTER — Telehealth: Payer: Self-pay

## 2014-11-20 DIAGNOSIS — I5032 Chronic diastolic (congestive) heart failure: Secondary | ICD-10-CM

## 2014-11-20 NOTE — Telephone Encounter (Signed)
Rosaria Ferries PA wanted patient to have a BMET on the same day as his Echo, 12/03/2014. Will schedule to have lab work done after his echo. Patient verbalized understanding.

## 2014-12-03 ENCOUNTER — Ambulatory Visit (HOSPITAL_COMMUNITY): Payer: MEDICARE | Attending: Cardiology

## 2014-12-03 ENCOUNTER — Ambulatory Visit (INDEPENDENT_AMBULATORY_CARE_PROVIDER_SITE_OTHER): Payer: MEDICARE | Admitting: *Deleted

## 2014-12-03 ENCOUNTER — Other Ambulatory Visit (INDEPENDENT_AMBULATORY_CARE_PROVIDER_SITE_OTHER): Payer: MEDICARE

## 2014-12-03 ENCOUNTER — Other Ambulatory Visit: Payer: Self-pay

## 2014-12-03 DIAGNOSIS — N189 Chronic kidney disease, unspecified: Secondary | ICD-10-CM | POA: Diagnosis not present

## 2014-12-03 DIAGNOSIS — I129 Hypertensive chronic kidney disease with stage 1 through stage 4 chronic kidney disease, or unspecified chronic kidney disease: Secondary | ICD-10-CM | POA: Diagnosis not present

## 2014-12-03 DIAGNOSIS — I340 Nonrheumatic mitral (valve) insufficiency: Secondary | ICD-10-CM | POA: Diagnosis not present

## 2014-12-03 DIAGNOSIS — I5032 Chronic diastolic (congestive) heart failure: Secondary | ICD-10-CM | POA: Diagnosis not present

## 2014-12-03 DIAGNOSIS — I429 Cardiomyopathy, unspecified: Secondary | ICD-10-CM | POA: Diagnosis present

## 2014-12-03 DIAGNOSIS — I517 Cardiomegaly: Secondary | ICD-10-CM | POA: Insufficient documentation

## 2014-12-03 DIAGNOSIS — I350 Nonrheumatic aortic (valve) stenosis: Secondary | ICD-10-CM | POA: Diagnosis not present

## 2014-12-03 DIAGNOSIS — I4891 Unspecified atrial fibrillation: Secondary | ICD-10-CM | POA: Diagnosis not present

## 2014-12-03 DIAGNOSIS — I421 Obstructive hypertrophic cardiomyopathy: Secondary | ICD-10-CM

## 2014-12-03 DIAGNOSIS — I482 Chronic atrial fibrillation, unspecified: Secondary | ICD-10-CM

## 2014-12-03 DIAGNOSIS — I352 Nonrheumatic aortic (valve) stenosis with insufficiency: Secondary | ICD-10-CM | POA: Diagnosis not present

## 2014-12-03 LAB — BASIC METABOLIC PANEL
BUN: 63 mg/dL — ABNORMAL HIGH (ref 6–23)
CO2: 27 meq/L (ref 19–32)
Calcium: 9.9 mg/dL (ref 8.4–10.5)
Chloride: 103 mEq/L (ref 96–112)
Creatinine, Ser: 4.58 mg/dL (ref 0.40–1.50)
GFR: 13.39 mL/min — AB (ref >60.00)
Glucose, Bld: 100 mg/dL — ABNORMAL HIGH (ref 70–99)
Potassium: 4.4 mEq/L (ref 3.5–5.1)
Sodium: 141 mEq/L (ref 135–145)

## 2014-12-03 LAB — POCT INR: INR: 3.7

## 2014-12-08 ENCOUNTER — Telehealth: Payer: Self-pay

## 2014-12-08 DIAGNOSIS — R0602 Shortness of breath: Secondary | ICD-10-CM

## 2014-12-08 DIAGNOSIS — I350 Nonrheumatic aortic (valve) stenosis: Secondary | ICD-10-CM

## 2014-12-08 NOTE — Telephone Encounter (Signed)
-----   Message from Sueanne Margarita, MD sent at 12/05/2014  4:16 PM EDT ----- Please let patient know that echo showed normal LVF with severe AS, mild AR, mild to moderate MR.  Suspect that severe AS is cause of worsening SOB.  I would like him set up for surgical consultation with Dr. Cyndia Bent given his significant comorbidities of CKD stage V and bladder CA.  If deemed to be a candidate for either open AVR or TAVR then will discuss with Renal medicine timing of cath since this will push over to hemodialysis.

## 2014-12-08 NOTE — Telephone Encounter (Signed)
Informed patient of results and verbal understanding expressed.  Referral for Dr. Cyndia Bent placed.  Patient agrees with treatment plan.

## 2014-12-09 ENCOUNTER — Encounter: Payer: Self-pay | Admitting: Cardiology

## 2014-12-09 ENCOUNTER — Ambulatory Visit (INDEPENDENT_AMBULATORY_CARE_PROVIDER_SITE_OTHER): Payer: MEDICARE | Admitting: Cardiology

## 2014-12-09 VITALS — BP 138/80 | HR 53 | Ht 68.0 in | Wt 180.8 lb

## 2014-12-09 DIAGNOSIS — I350 Nonrheumatic aortic (valve) stenosis: Secondary | ICD-10-CM | POA: Diagnosis not present

## 2014-12-09 DIAGNOSIS — I5032 Chronic diastolic (congestive) heart failure: Secondary | ICD-10-CM

## 2014-12-09 DIAGNOSIS — I10 Essential (primary) hypertension: Secondary | ICD-10-CM | POA: Diagnosis not present

## 2014-12-09 DIAGNOSIS — I482 Chronic atrial fibrillation, unspecified: Secondary | ICD-10-CM

## 2014-12-09 LAB — BASIC METABOLIC PANEL
BUN: 67 mg/dL — ABNORMAL HIGH (ref 6–23)
CHLORIDE: 100 meq/L (ref 96–112)
CO2: 26 meq/L (ref 19–32)
Calcium: 9.6 mg/dL (ref 8.4–10.5)
Creatinine, Ser: 5.1 mg/dL (ref 0.40–1.50)
GFR: 11.82 mL/min — CL (ref >60.00)
Glucose, Bld: 116 mg/dL — ABNORMAL HIGH (ref 70–99)
Potassium: 4.6 mEq/L (ref 3.5–5.1)
Sodium: 137 mEq/L (ref 135–145)

## 2014-12-09 LAB — BRAIN NATRIURETIC PEPTIDE: PRO B NATRI PEPTIDE: 3135 pg/mL — AB (ref 0.0–100.0)

## 2014-12-09 MED ORDER — FUROSEMIDE 80 MG PO TABS
80.0000 mg | ORAL_TABLET | Freq: Two times a day (BID) | ORAL | Status: DC
Start: 2014-12-09 — End: 2014-12-22

## 2014-12-09 NOTE — Progress Notes (Signed)
Cardiology Office Note   Date:  12/09/2014   ID:  [name], DOB 1940/10/17, MRN [id number]  PCP:  [name], MD    Chief Complaint  Patient presents with  . Follow-up    chronic diastolic CHF      History of Present Illness: [name] is a 74 y.o. male with a history of atrial fibrillation, HTN, HOCM, orthostatic hypotension, stage 5 CKD, chronic diastolic CHF and aortic stenosis. He presents today for followup. He is doing well. His SOB has resolved after stopping amiodarone. He returns today for follow-up. He denies any chest pain, dizziness or syncope. He has chronic when he starts retaining too much fluid but recently has been getting more DOE. His LE edema appears worse even after increasing lasix.   He weighs himself daily and on average 170-172lbs.  He went on a cruise and when he got home his weight was 165 and then went back up to 169 lbs.  His renal MD increased his Lasix dose but continues to have SOB and LE edema.      Past Medical History  Diagnosis Date  . Paroxysmal atrial fibrillation     a. cardioversion 11/21/10 - Dr Radford Pax. b. 05/2013 - experiencing more SOB. 24-hr holter showed avg HR 49, max 82, min 38bpm. ETT showed baseline junctional bradycardia, HR incr only to 90bpm with drop in BP. Saw EP - amiodarone reduced and eventually d/c'd with improvement in dyspnea.  . Hypertension   . Warfarin anticoagulation   . CKD (chronic kidney disease), stage V     a. Followed by Dr. Jimmy Footman. b. IV fistula placed 04/17/14.  [name] Aortic valve stenosis     moderate by echo 06/2014  . Orthostatic hypotension   . Diastolic dysfunction   . Arthritis   . Self-catheterizes urinary bladder   . Junctional bradycardia   . NSVT (nonsustained ventricular tachycardia)     a. By holter 01/2014.  [name] Shortness of breath dyspnea   . PVD (peripheral vascular disease)   . Cardiomyopathy   . RTA (renal tubular acidosis)   . Hypomagnesemia    . Bladder cancer     a. 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  [name] A-V fistula     Past Surgical History  Procedure Laterality Date  . Cardioversion  11/21/2010  . Cystectomy    . Cholecystectomy    . Cataract extraction w/ intraocular lens  implant, bilateral    . Colonoscopy    . Av fistula placement Left 04/17/2014    Procedure: ARTERIOVENOUS (AV) FISTULA CREATION- LEFT UPPER ARM ;  Surgeon: Mal [name], MD;  Location: [facility] OR;  Service: Vascular;  Laterality: Left;  . Bladder surgery       Current Outpatient Prescriptions  Medication Sig Dispense Refill  . calcitRIOL (ROCALTROL) 0.25 MCG capsule Take 0.25 mcg by mouth daily.    [name] diltiazem (CARDIZEM CD) 180 MG 24 hr capsule Take 180 mg by mouth at bedtime.    . ferrous sulfate 325 (65 FE) MG tablet Take 325 mg by mouth daily.    . furosemide (LASIX) 40 MG tablet Take two (2) tablets (80 mg total) by mouth every morning and one (1) tablet ( 40 mg total) by mouth every evening    . Loperamide HCl (IMODIUM PO) Take 1 tablet by mouth 2 (two) times daily.    [name]  metoprolol succinate (TOPROL-XL) 50 MG 24 hr tablet Take 50 mg by mouth daily. Take with or immediately following a meal.    . Multiple Vitamins-Minerals (CENTRUM SILVER ULTRA MENS PO) Take 1 tablet by mouth daily. Take daily    . Pancrelipase, Lip-Prot-Amyl, (CREON) 24000 UNITS CPEP Take one (1) capsule by mouth with each meal    . PRESCRIPTION MEDICATION Take 1 tablet by mouth daily. (4-STRAIN PROBIOTIC)    . sevelamer carbonate (RENVELA) 800 MG tablet Take one (1) tablet by mouth with each meal    . sodium bicarbonate 650 MG tablet Take 2 tablets (1,300 mg total) by mouth 2 (two) times daily. 60 tablet 0  . warfarin (COUMADIN) 5 MG tablet Take 0.5 tablets (2.5 mg total) by mouth daily. Take as directed by coumadin clinic 90 tablet 1   No current facility-administered medications for this visit.    Allergies:   Amiodarone and Oxycodone hcl     Social History:  The patient  reports that he quit smoking about 31 years ago. His smoking use included Cigarettes. He has a 66 pack-year smoking history. He has never used smokeless tobacco. He reports that he does not drink alcohol or use illicit drugs.   Family History:  The patient's family history includes Alcohol abuse in his father; Alzheimer's disease in his sister; Arrhythmia in his father; CVA in his mother; Heart attack in his father; Heart disease in his father; Multiple sclerosis in his brother.    ROS:  Please see the history of present illness.   Otherwise, review of systems are positive for none.   All other systems are reviewed and negative.    PHYSICAL EXAM: VS:  BP 138/80 mmHg  Pulse 53  Ht 5\' 8"  (1.727 m)  Wt 180 lb 12.8 oz (82.01 kg)  BMI 27.50 kg/m2  SpO2 97% , BMI Body mass index is 27.5 kg/(m^2). GEN: Well nourished, well developed, in no acute distress HEENT: normal Neck: no JVD, carotid bruits, or masses Cardiac: irregularly irregular with 2/6 SM late peaking at RUSB to LLSB, rubs, or gallops.  2+ edema b/l.   Respiratory:  clear to auscultation bilaterally, normal work of breathing GI: soft, nontender, nondistended, + BS MS: no deformity or atrophy Skin: warm and dry, no rash Neuro:  Strength and sensation are intact Psych: euthymic mood, full affect   EKG:  EKG was ordered today and showed atrial fibrillation with CVR.      Recent Labs: 04/28/2014: TSH 2.46 05/02/2014: Magnesium 1.8 06/09/2014: B Natriuretic Peptide 2524.7* 06/10/2014: ALT 48; Hemoglobin 10.9*; Platelets 202 12/03/2014: BUN 63*; Creatinine, Ser 4.58*; Potassium 4.4; Sodium 141    Lipid Panel No results found for: CHOL, TRIG, HDL, CHOLHDL, VLDL, LDLCALC, LDLDIRECT    Wt Readings from Last 3 Encounters:  12/09/14 180 lb 12.8 oz (82.01 kg)  11/19/14 176 lb 9.6 oz (80.105 kg)  09/14/14 182 lb 6.4 oz (82.736 kg)      ASSESSMENT AND PLAN:  1. Persistent atrial fibrillation -  rate controlled. Continue CCB/BB/warfarin 2. CKD stage V with recent AV fistula placement  3. Aortic stenosis that has progressed to severe by recent echo. He has been referred to CVTS to see if he is a candidate for either open AVR or TAVR.  He has CKD and is nearing HD.  He also has bladder CA so may not be a candidate.  He now has evidence of worsening CHF on exam and SOB to the point he has become very sedentary.  We did discuss that moving forward with AVR which definitely push him over to dialysis and he is ready for that.  I will get him in to see Dr. Cyndia Bent this week.   4. Prior h/o HOCM - not really seen on most recent echo. 5. H/o junctional bradycardia - see above. Patient knows to observe closely for symptoms of low HR. 6. NSVT 7. Chronic diastolic CHF NYHA class III. He is having worsening CHF symptoms that I think are multifactorial from progression of AS as well as CKD.  He has a matured AVF graft in placed and is nearing need to go on dialysis.  He has evidence of volume overload on exam today.  I am going to increase Lasix to 80mg  BID and have him followup with renal this week.  I will  Check a BMET and BNP today. 8. Moderate MS and mild to moderate MR by echo    Current medicines are reviewed at length with the patient today.  The patient does not have concerns regarding medicines.  The following changes have been made:  no change  Labs/ tests ordered today: See above Assessment and Plan No orders of the defined types were placed in this encounter.     Disposition:   FU with me in 3 months  Signed, Sueanne Margarita, MD  12/09/2014 2:00 PM    [facility] [place], [place], [place]  [address] Phone: [phone]; Fax: [phone]

## 2014-12-09 NOTE — Patient Instructions (Addendum)
Medication Instructions:  Your physician has recommended you make the following change in your medication:  1) INCREASE LASIX to 80 mg TWICE DAILY  Labwork: TODAY: BMET, BNP  Testing/Procedures: None  Follow-Up: Your physician recommends that you schedule a follow-up appointment THIS WEEK with Dr. Cyndia Bent for evaluation of symptomatic severe aortic stenosis.  Your physician recommends that you schedule a follow-up appointment THIS WEEK with Dr. Shelia Media for evaluation of non-healing leg ulcer.  Your physician recommends that you schedule a follow-up appointment THIS WEEK with Dr. Jimmy Footman.  Your physician recommends that you schedule a follow-up appointment in 3 months with Dr. Radford Pax.  Any Other Special Instructions Will Be Listed Below (If Applicable).

## 2014-12-10 ENCOUNTER — Encounter (HOSPITAL_COMMUNITY): Payer: Self-pay | Admitting: Emergency Medicine

## 2014-12-10 ENCOUNTER — Telehealth: Payer: Self-pay

## 2014-12-10 ENCOUNTER — Emergency Department (HOSPITAL_COMMUNITY): Payer: MEDICARE

## 2014-12-10 ENCOUNTER — Inpatient Hospital Stay (HOSPITAL_COMMUNITY)
Admission: EM | Admit: 2014-12-10 | Discharge: 2014-12-22 | DRG: 286 | Disposition: A | Discharge status: Home / Self Care | Payer: MEDICARE | Attending: Cardiology | Admitting: Cardiology

## 2014-12-10 DIAGNOSIS — D649 Anemia, unspecified: Secondary | ICD-10-CM | POA: Diagnosis present

## 2014-12-10 DIAGNOSIS — Z7901 Long term (current) use of anticoagulants: Secondary | ICD-10-CM

## 2014-12-10 DIAGNOSIS — I340 Nonrheumatic mitral (valve) insufficiency: Secondary | ICD-10-CM | POA: Diagnosis present

## 2014-12-10 DIAGNOSIS — N186 End stage renal disease: Secondary | ICD-10-CM | POA: Diagnosis present

## 2014-12-10 DIAGNOSIS — Z8249 Family history of ischemic heart disease and other diseases of the circulatory system: Secondary | ICD-10-CM

## 2014-12-10 DIAGNOSIS — I739 Peripheral vascular disease, unspecified: Secondary | ICD-10-CM | POA: Diagnosis present

## 2014-12-10 DIAGNOSIS — I421 Obstructive hypertrophic cardiomyopathy: Secondary | ICD-10-CM | POA: Diagnosis present

## 2014-12-10 DIAGNOSIS — I5032 Chronic diastolic (congestive) heart failure: Secondary | ICD-10-CM | POA: Diagnosis present

## 2014-12-10 DIAGNOSIS — I5033 Acute on chronic diastolic (congestive) heart failure: Principal | ICD-10-CM | POA: Diagnosis present

## 2014-12-10 DIAGNOSIS — N2581 Secondary hyperparathyroidism of renal origin: Secondary | ICD-10-CM | POA: Diagnosis present

## 2014-12-10 DIAGNOSIS — J439 Emphysema, unspecified: Secondary | ICD-10-CM | POA: Diagnosis present

## 2014-12-10 DIAGNOSIS — N189 Chronic kidney disease, unspecified: Secondary | ICD-10-CM | POA: Diagnosis not present

## 2014-12-10 DIAGNOSIS — I2510 Atherosclerotic heart disease of native coronary artery without angina pectoris: Secondary | ICD-10-CM | POA: Diagnosis present

## 2014-12-10 DIAGNOSIS — Z87891 Personal history of nicotine dependence: Secondary | ICD-10-CM

## 2014-12-10 DIAGNOSIS — I482 Chronic atrial fibrillation: Secondary | ICD-10-CM | POA: Diagnosis present

## 2014-12-10 DIAGNOSIS — Z79899 Other long term (current) drug therapy: Secondary | ICD-10-CM | POA: Diagnosis not present

## 2014-12-10 DIAGNOSIS — Z888 Allergy status to other drugs, medicaments and biological substances status: Secondary | ICD-10-CM

## 2014-12-10 DIAGNOSIS — Z885 Allergy status to narcotic agent status: Secondary | ICD-10-CM | POA: Diagnosis not present

## 2014-12-10 DIAGNOSIS — I272 Other secondary pulmonary hypertension: Secondary | ICD-10-CM | POA: Diagnosis present

## 2014-12-10 DIAGNOSIS — L97919 Non-pressure chronic ulcer of unspecified part of right lower leg with unspecified severity: Secondary | ICD-10-CM | POA: Diagnosis present

## 2014-12-10 DIAGNOSIS — N185 Chronic kidney disease, stage 5: Secondary | ICD-10-CM | POA: Diagnosis present

## 2014-12-10 DIAGNOSIS — M1990 Unspecified osteoarthritis, unspecified site: Secondary | ICD-10-CM | POA: Diagnosis present

## 2014-12-10 DIAGNOSIS — I10 Essential (primary) hypertension: Secondary | ICD-10-CM | POA: Diagnosis present

## 2014-12-10 DIAGNOSIS — I4891 Unspecified atrial fibrillation: Secondary | ICD-10-CM | POA: Diagnosis not present

## 2014-12-10 DIAGNOSIS — I350 Nonrheumatic aortic (valve) stenosis: Secondary | ICD-10-CM | POA: Diagnosis not present

## 2014-12-10 DIAGNOSIS — Z8551 Personal history of malignant neoplasm of bladder: Secondary | ICD-10-CM | POA: Diagnosis not present

## 2014-12-10 DIAGNOSIS — R600 Localized edema: Secondary | ICD-10-CM | POA: Insufficient documentation

## 2014-12-10 DIAGNOSIS — I120 Hypertensive chronic kidney disease with stage 5 chronic kidney disease or end stage renal disease: Secondary | ICD-10-CM | POA: Diagnosis present

## 2014-12-10 HISTORY — DX: Other persistent atrial fibrillation: I48.19

## 2014-12-10 HISTORY — DX: Atherosclerotic heart disease of native coronary artery without angina pectoris: I25.10

## 2014-12-10 LAB — BASIC METABOLIC PANEL
Anion gap: 14 (ref 5–15)
BUN: 70 mg/dL — ABNORMAL HIGH (ref 6–20)
CALCIUM: 9.6 mg/dL (ref 8.9–10.3)
CO2: 23 mmol/L (ref 22–32)
Chloride: 102 mmol/L (ref 101–111)
Creatinine, Ser: 5.46 mg/dL — ABNORMAL HIGH (ref 0.61–1.24)
GFR calc Af Amer: 11 mL/min — ABNORMAL LOW (ref >60)
GFR calc non Af Amer: 9 mL/min — ABNORMAL LOW (ref >60)
GLUCOSE: 89 mg/dL (ref 65–99)
Potassium: 4.5 mmol/L (ref 3.5–5.1)
SODIUM: 139 mmol/L (ref 135–145)

## 2014-12-10 LAB — CBC WITH DIFFERENTIAL/PLATELET
Basophils Absolute: 0 10*3/uL (ref 0.0–0.1)
Basophils Relative: 0 % (ref 0–1)
Eosinophils Absolute: 0.3 10*3/uL (ref 0.0–0.7)
Eosinophils Relative: 3 % (ref 0–5)
HCT: 38.8 % — ABNORMAL LOW (ref 39.0–52.0)
Hemoglobin: 12.7 g/dL — ABNORMAL LOW (ref 13.0–17.0)
LYMPHS PCT: 19 % (ref 12–46)
Lymphs Abs: 1.6 10*3/uL (ref 0.7–4.0)
MCH: 35.4 pg — AB (ref 26.0–34.0)
MCHC: 32.7 g/dL (ref 30.0–36.0)
MCV: 108.1 fL — ABNORMAL HIGH (ref 78.0–100.0)
MONO ABS: 0.7 10*3/uL (ref 0.1–1.0)
MONOS PCT: 9 % (ref 3–12)
NEUTROS ABS: 5.7 10*3/uL (ref 1.7–7.7)
Neutrophils Relative %: 69 % (ref 43–77)
PLATELETS: 230 10*3/uL (ref 150–400)
RBC: 3.59 MIL/uL — ABNORMAL LOW (ref 4.22–5.81)
RDW: 19.3 % — AB (ref 11.5–15.5)
WBC: 8.3 10*3/uL (ref 4.0–10.5)

## 2014-12-10 LAB — I-STAT TROPONIN, ED: TROPONIN I, POC: 0.04 ng/mL (ref 0.00–0.08)

## 2014-12-10 LAB — PROTIME-INR
INR: 2.3 — ABNORMAL HIGH (ref 0.00–1.49)
PROTHROMBIN TIME: 25.1 s — AB (ref 11.6–15.2)

## 2014-12-10 MED ORDER — SEVELAMER CARBONATE 800 MG PO TABS
800.0000 mg | ORAL_TABLET | Freq: Three times a day (TID) | ORAL | Status: DC
  Filled 2014-12-10: qty 1

## 2014-12-10 MED ORDER — FERROUS SULFATE 325 (65 FE) MG PO TABS
325.0000 mg | ORAL_TABLET | Freq: Every day | ORAL | Status: DC
  Administered 2014-12-11 – 2014-12-20 (×9): 325 mg via ORAL
  Filled 2014-12-10 (×12): qty 1

## 2014-12-10 MED ORDER — WARFARIN SODIUM 5 MG PO TABS
5.0000 mg | ORAL_TABLET | Freq: Once | ORAL | Status: AC
  Administered 2014-12-10: 5 mg via ORAL
  Filled 2014-12-10: qty 1

## 2014-12-10 MED ORDER — METOPROLOL SUCCINATE ER 50 MG PO TB24
50.0000 mg | ORAL_TABLET | Freq: Every day | ORAL | Status: DC
  Administered 2014-12-11 – 2014-12-13 (×3): 50 mg via ORAL
  Filled 2014-12-10 (×4): qty 1

## 2014-12-10 MED ORDER — ONDANSETRON HCL 4 MG/2ML IJ SOLN: 4.0000 mg | Freq: Four times a day (QID) | INTRAMUSCULAR | Status: DC | PRN

## 2014-12-10 MED ORDER — ACETAMINOPHEN 325 MG PO TABS
650.0000 mg | ORAL_TABLET | ORAL | Status: DC | PRN
  Administered 2014-12-20 – 2014-12-21 (×2): 650 mg via ORAL
  Filled 2014-12-10 (×2): qty 2

## 2014-12-10 MED ORDER — SODIUM BICARBONATE 650 MG PO TABS
1300.0000 mg | ORAL_TABLET | Freq: Two times a day (BID) | ORAL | Status: DC
  Administered 2014-12-10 – 2014-12-20 (×20): 1300 mg via ORAL
  Filled 2014-12-10 (×24): qty 2

## 2014-12-10 MED ORDER — WARFARIN - PHARMACIST DOSING INPATIENT
Freq: Every day | Status: DC
  Administered 2014-12-11: 18:00:00

## 2014-12-10 MED ORDER — FUROSEMIDE 10 MG/ML IJ SOLN
40.0000 mg | Freq: Once | INTRAMUSCULAR | Status: AC
  Administered 2014-12-10: 40 mg via INTRAVENOUS
  Filled 2014-12-10: qty 4

## 2014-12-10 MED ORDER — CALCITRIOL 0.25 MCG PO CAPS
0.2500 ug | ORAL_CAPSULE | Freq: Every day | ORAL | Status: DC
  Administered 2014-12-11 – 2014-12-22 (×12): 0.25 ug via ORAL
  Filled 2014-12-10 (×15): qty 1

## 2014-12-10 MED ORDER — SODIUM CHLORIDE 0.9 % IJ SOLN
3.0000 mL | Freq: Two times a day (BID) | INTRAMUSCULAR | Status: DC
  Administered 2014-12-10 – 2014-12-17 (×14): 3 mL via INTRAVENOUS

## 2014-12-10 MED ORDER — DILTIAZEM HCL ER COATED BEADS 180 MG PO CP24
180.0000 mg | ORAL_CAPSULE | Freq: Every day | ORAL | Status: DC
  Administered 2014-12-10 – 2014-12-21 (×12): 180 mg via ORAL
  Filled 2014-12-10 (×14): qty 1

## 2014-12-10 MED ORDER — PANCRELIPASE (LIP-PROT-AMYL) 12000-38000 UNITS PO CPEP
24000.0000 [IU] | ORAL_CAPSULE | Freq: Three times a day (TID) | ORAL | Status: DC
  Filled 2014-12-10: qty 2

## 2014-12-10 MED ORDER — SODIUM CHLORIDE 0.9 % IV SOLN: 250.0000 mL | INTRAVENOUS | Status: DC | PRN

## 2014-12-10 MED ORDER — SODIUM CHLORIDE 0.9 % IJ SOLN: 3.0000 mL | INTRAMUSCULAR | Status: DC | PRN

## 2014-12-10 NOTE — Telephone Encounter (Signed)
-----   Message from Sueanne Margarita, MD sent at 12/10/2014  9:05 AM EDT ----- Patient now with worsening renal function and volume overload with acute on chronic CHF.  His BNP is markedly elevated.  He needs to be admitted for IV diuresis and renal consult.  He is most likely at the point he may need to go on dialysis.  Also needs to be seen by CVTS to determine if a candidate for AVR or TAVR given worsening AS.

## 2014-12-10 NOTE — ED Provider Notes (Addendum)
CSN: [id number]     Arrival date & time 12/10/14  1814 History   First MD Initiated Contact with Patient 12/10/14 1851     Chief Complaint  Patient presents with  . Shortness of Breath  . Leg Swelling     (Consider location/radiation/quality/duration/timing/severity/associated sxs/prior Treatment) Patient is a 74 y.o. male presenting with shortness of breath. The history is provided by the patient.  Shortness of Breath Severity:  Severe Onset quality:  Gradual Duration:  1 week Timing:  Constant Progression:  Worsening Chronicity:  Chronic Context: activity   Relieved by:  Nothing Worsened by:  Exertion, movement and activity Ineffective treatments:  None tried Associated symptoms: no abdominal pain, no chest pain, no cough, no fever and no syncope    74 yo M with a chief complaint shortness breath. Patient states is been an ongoing problem. Patient has a history of aortic stenosis that needs repair. Patient also with worsening heart failure and worsening renal failure over the past few months. Seen by his cardiologist yesterday labs came back significantly elevated of worsening creatinine as well as BNP. Called to come in here to be admitted. Patient states that he has been having worsening shortness of breath on exertion denies chest pain with this. Significant lower extremity edema.  Past Medical History  Diagnosis Date  . Paroxysmal atrial fibrillation     a. cardioversion 11/21/10 - Dr Radford Pax. b. 05/2013 - experiencing more SOB. 24-hr holter showed avg HR 49, max 82, min 38bpm. ETT showed baseline junctional bradycardia, HR incr only to 90bpm with drop in BP. Saw EP - amiodarone reduced and eventually d/c'd with improvement in dyspnea.  . Hypertension   . Warfarin anticoagulation   . CKD (chronic kidney disease), stage V     a. Followed by Dr. Jimmy Footman. b. IV fistula placed 04/17/14.  [name] Aortic valve stenosis     moderate by echo 06/2014  . Orthostatic hypotension   .  Diastolic dysfunction   . Arthritis   . Self-catheterizes urinary bladder   . Junctional bradycardia   . NSVT (nonsustained ventricular tachycardia)     a. By holter 01/2014.  [name] Shortness of breath dyspnea   . PVD (peripheral vascular disease)   . Cardiomyopathy   . RTA (renal tubular acidosis)   . Hypomagnesemia   . Bladder cancer     a. 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  [name] A-V fistula    Past Surgical History  Procedure Laterality Date  . Cardioversion  11/21/2010  . Cystectomy    . Cholecystectomy    . Cataract extraction w/ intraocular lens  implant, bilateral    . Colonoscopy    . Av fistula placement Left 04/17/2014    Procedure: ARTERIOVENOUS (AV) FISTULA CREATION- LEFT UPPER ARM ;  Surgeon: Mal [name], MD;  Location[facility] OR;  Service: Vascular;  Laterality: Left;  . Bladder surgery     Family History  Problem Relation Age of Onset  . CVA Mother   . Heart attack Father   . Heart disease Father   . Arrhythmia Father   . Alzheimer's disease Sister   . Multiple sclerosis Brother   . Alcohol abuse Father    Social History  Substance Use Topics  . Smoking status: Former Smoker -- 2.00 packs/day for 33 years    Types: Cigarettes    Quit date: 05/02/1983  . Smokeless tobacco: Never Used  . Alcohol Use: No    Review of Systems  Constitutional: Negative for fever.  Respiratory: Positive for shortness of breath. Negative for cough.   Cardiovascular: Positive for leg swelling. Negative for chest pain and syncope.  Gastrointestinal: Negative for abdominal pain.      Allergies  Amiodarone and Oxycodone hcl  Home Medications   Prior to Admission medications   Medication Sig Start Date End Date Taking? Authorizing Provider  calcitRIOL (ROCALTROL) 0.25 MCG capsule Take 0.25 mcg by mouth daily.   Yes Historical Provider, MD  diltiazem (CARDIZEM CD) 180 MG 24 hr capsule Take 180 mg by mouth at bedtime.   Yes Historical Provider, MD   ferrous sulfate 325 (65 FE) MG tablet Take 325 mg by mouth daily.   Yes Historical Provider, MD  furosemide (LASIX) 40 MG tablet Take 40 mg by mouth 2 (two) times daily.   Yes Historical Provider, MD  Loperamide HCl (IMODIUM PO) Take 1 tablet by mouth 2 (two) times daily.   Yes Historical Provider, MD  metoprolol succinate (TOPROL-XL) 100 MG 24 hr tablet Take 50 mg by mouth daily.   Yes Historical Provider, MD  Multiple Vitamins-Minerals (CENTRUM SILVER ULTRA MENS PO) Take 1 tablet by mouth daily. Take daily   Yes Historical Provider, MD  Pancrelipase, Lip-Prot-Amyl, (CREON) 24000 UNITS CPEP Take one (1) capsule by mouth with each meal   Yes Historical Provider, MD  PRESCRIPTION MEDICATION Take 1 tablet by mouth daily. (4-STRAIN PROBIOTIC)   Yes Historical Provider, MD  sevelamer carbonate (RENVELA) 800 MG tablet Take one (1) tablet by mouth with each meal   Yes Historical Provider, MD  sodium bicarbonate 650 MG tablet Take 2 tablets (1,300 mg total) by mouth 2 (two) times daily. 06/12/14  Yes Thurnell Lose, MD  warfarin (COUMADIN) 5 MG tablet Take 0.5 tablets (2.5 mg total) by mouth daily. Take as directed by coumadin clinic Patient taking differently: Take 2.5-5 mg by mouth daily. 2.5 mg daily except 5 mg on Thursday 05/22/14  Yes Kinnie Feil, MD  furosemide (LASIX) 80 MG tablet Take 1 tablet (80 mg total) by mouth 2 (two) times daily. Patient not taking: Reported on 12/10/2014 12/09/14   Sueanne Margarita, MD   BP 113/95 mmHg  Pulse 120  Temp(Src) 97.7 F (36.5 C) (Oral)  Resp 20  Ht 5\' 8"  (1.727 m)  Wt 174 lb 9.6 oz (79.198 kg)  BMI 26.55 kg/m2  SpO2 96% Physical Exam  Constitutional: He is oriented to person, place, and time. He appears well-developed and well-nourished.  HENT:  Head: Normocephalic and atraumatic.  JVD up to the angle of the jaw.  Eyes: EOM are normal. Pupils are equal, round, and reactive to light.  Neck: Normal range of motion. Neck supple. No JVD present.   Cardiovascular: Normal rate and regular rhythm.  Exam reveals no gallop and no friction rub.   No murmur heard. Pulmonary/Chest: No respiratory distress. He has no wheezes. He has rales (up to the spine of the scapula.).  Abdominal: He exhibits no distension. There is no rebound and no guarding.  Musculoskeletal: Normal range of motion. He exhibits edema.  Neurological: He is alert and oriented to person, place, and time.  Skin: No rash noted. No pallor.  Psychiatric: He has a normal mood and affect. His behavior is normal.    ED Course  Procedures (including critical care time) Labs Review Labs Reviewed  BASIC METABOLIC PANEL - Abnormal; Notable for the following:    BUN 70 (*)    Creatinine, Ser 5.46 (*)  GFR calc non Af Amer 9 (*)    GFR calc Af Amer 11 (*)    All other components within normal limits  PROTIME-INR - Abnormal; Notable for the following:    Prothrombin Time 25.1 (*)    INR 2.30 (*)    All other components within normal limits  CBC WITH DIFFERENTIAL/PLATELET - Abnormal; Notable for the following:    RBC 3.59 (*)    Hemoglobin 12.7 (*)    HCT 38.8 (*)    MCV 108.1 (*)    MCH 35.4 (*)    RDW 19.3 (*)    All other components within normal limits  BRAIN NATRIURETIC PEPTIDE  BASIC METABOLIC PANEL  MAGNESIUM  CBC WITH DIFFERENTIAL/PLATELET  PROTIME-INR  Randolm Idol, ED    Imaging Review Dg Chest 2 View  12/10/2014   CLINICAL DATA:  Cough x3 months. SOB x8 months. No chest pain.Hx of HTN, CKD, PAF, Cardiomyopathy, aortic valve stenosis.  EXAM: CHEST  2 VIEW  COMPARISON:  10/13/2014  FINDINGS: Cardiopericardial silhouette is mildly enlarged. Aorta is mildly uncoiled. No mediastinal or hilar masses or evidence of adenopathy.  Mild interstitial prominence in the lower lungs. Minimal pleural effusions. No lung consolidation to suggest pneumonia. No pneumothorax.  Bony thorax is demineralized but grossly intact.  IMPRESSION: 1. Since prior study, mild  interstitial thickening has developed in the lower lungs along with minimal pleural effusions. There is stable mild cardiomegaly. Findings suggest mild congestive heart failure. No convincing pneumonia.   Electronically Signed   By: Lajean Manes M.D.   On: 12/10/2014 19:14   I, Cecilio Asper, personally reviewed and evaluated these images and lab results as part of my medical decision-making.   EKG Interpretation   Date/Time:  Thursday December 10 2014 18:19:03 EDT Ventricular Rate:  99 PR Interval:    QRS Duration: 102 QT Interval:  322 QTC Calculation: 413 R Axis:   147 Text Interpretation:  Atrial fibrillation Right axis deviation Incomplete  right bundle branch block Right ventricular hypertrophy Nonspecific ST  abnormality Abnormal QRS-T angle, consider primary T wave abnormality  Abnormal ECG No significant change was found Confirmed by Akelia Husted MD, DANIEL  ([id number]) on 12/10/2014 9:09:39 PM      MDM   Final diagnoses:  Acute on chronic diastolic heart failure    74 yo M with a CHF exacerbation. Located by aortic stenosis as well as worsening chronic renal failure. Cardiology consultation for admission.  The patients results and plan were reviewed and discussed.   Any x-rays performed were independently reviewed by myself.   Differential diagnosis were considered with the presenting HPI.  Medications  sodium chloride 0.9 % injection 3 mL (3 mLs Intravenous Given 12/10/14 2355)  sodium chloride 0.9 % injection 3 mL (not administered)  0.9 %  sodium chloride infusion (not administered)  acetaminophen (TYLENOL) tablet 650 mg (not administered)  ondansetron (ZOFRAN) injection 4 mg (not administered)  calcitRIOL (ROCALTROL) capsule 0.25 mcg (0.25 mcg Oral Not Given 12/10/14 2353)  diltiazem (CARDIZEM CD) 24 hr capsule 180 mg (180 mg Oral Given 12/10/14 2355)  ferrous sulfate tablet 325 mg (325 mg Oral Not Given 12/10/14 2353)  metoprolol succinate (TOPROL-XL) 24 hr tablet 50  mg (not administered)  sevelamer carbonate (RENVELA) tablet 800 mg (not administered)  sodium bicarbonate tablet 1,300 mg (1,300 mg Oral Given 12/10/14 2353)  lipase/protease/amylase (CREON) capsule 24,000 Units (not administered)  Warfarin - Pharmacist Dosing Inpatient (not administered)  furosemide (LASIX) injection 40 mg (40 mg Intravenous  Given 12/10/14 2008)  furosemide (LASIX) injection 40 mg (40 mg Intravenous Given 12/10/14 2355)  warfarin (COUMADIN) tablet 5 mg (5 mg Oral Given 12/10/14 2354)    Filed Vitals:   12/10/14 2015 12/10/14 2045 12/10/14 2137 12/10/14 2154  BP: 134/99 145/85  113/95  Pulse:    120  Temp:    97.7 F (36.5 C)  TempSrc:    Oral  Resp: 18 16 20    Height:   5\' 8"  (1.727 m)   Weight:   174 lb 9.6 oz (79.198 kg)   SpO2: 94% 98%  96%    Final diagnoses:  Acute on chronic diastolic heart failure    Admission/ observation were discussed with the admitting physician, patient and/or family and they are comfortable with the plan.     Deno Etienne, DO 12/10/14 2110  Deno Etienne, DO 12/11/14 1855

## 2014-12-10 NOTE — Progress Notes (Signed)
ANTICOAGULATION CONSULT NOTE - Initial Consult  Pharmacy Consult for warfarin Indication: atrial fibrillation  Allergies  Allergen Reactions  . Amiodarone Other (See Comments)    Severely reduced DLCO  . Oxycodone Hcl Rash and Other (See Comments)    Rash and itching    Patient Measurements:    Vital Signs: Temp: 97.8 F (36.6 C) (08/11 1825) Temp Source: Oral (08/11 1825) BP: 145/85 mmHg (08/11 2045) Pulse Rate: 89 (08/11 1945)  Labs:  Recent Labs  12/09/14 1522 12/10/14 1827  HGB  --  12.7*  HCT  --  38.8*  PLT  --  230  LABPROT  --  25.1*  INR  --  2.30*  CREATININE 5.10* 5.46*    Estimated Creatinine Clearance: 11.5 mL/min (by C-G formula based on Cr of 5.46).   Medical History: Past Medical History  Diagnosis Date  . Paroxysmal atrial fibrillation     a. cardioversion 11/21/10 - Dr Radford Pax. b. 05/2013 - experiencing more SOB. 24-hr holter showed avg HR 49, max 82, min 38bpm. ETT showed baseline junctional bradycardia, HR incr only to 90bpm with drop in BP. Saw EP - amiodarone reduced and eventually d/c'd with improvement in dyspnea.  . Hypertension   . Warfarin anticoagulation   . CKD (chronic kidney disease), stage V     a. Followed by Dr. Jimmy Footman. b. IV fistula placed 04/17/14.  [name] Aortic valve stenosis     moderate by echo 06/2014  . Orthostatic hypotension   . Diastolic dysfunction   . Arthritis   . Self-catheterizes urinary bladder   . Junctional bradycardia   . NSVT (nonsustained ventricular tachycardia)     a. By holter 01/2014.  [name] Shortness of breath dyspnea   . PVD (peripheral vascular disease)   . Cardiomyopathy   . RTA (renal tubular acidosis)   . Hypomagnesemia   . Bladder cancer     a. 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  [name] A-V fistula     Medications:  Prescriptions prior to admission  Medication Sig Dispense Refill Last Dose  . calcitRIOL (ROCALTROL) 0.25 MCG capsule Take 0.25 mcg by mouth daily.    12/10/2014 at Unknown time  . diltiazem (CARDIZEM CD) 180 MG 24 hr capsule Take 180 mg by mouth at bedtime.   12/09/2014 at Unknown time  . ferrous sulfate 325 (65 FE) MG tablet Take 325 mg by mouth daily.   12/10/2014 at Unknown time  . furosemide (LASIX) 40 MG tablet Take 40 mg by mouth 2 (two) times daily.   12/09/2014  . Loperamide HCl (IMODIUM PO) Take 1 tablet by mouth 2 (two) times daily.   12/10/2014 at Unknown time  . metoprolol succinate (TOPROL-XL) 100 MG 24 hr tablet Take 50 mg by mouth daily.   12/10/2014 at Unknown time  . Multiple Vitamins-Minerals (CENTRUM SILVER ULTRA MENS PO) Take 1 tablet by mouth daily. Take daily   12/10/2014 at Unknown time  . Pancrelipase, Lip-Prot-Amyl, (CREON) 24000 UNITS CPEP Take one (1) capsule by mouth with each meal   12/10/2014 at Unknown time  . PRESCRIPTION MEDICATION Take 1 tablet by mouth daily. (4-STRAIN PROBIOTIC)   12/10/2014 at Unknown time  . sevelamer carbonate (RENVELA) 800 MG tablet Take one (1) tablet by mouth with each meal   12/10/2014 at Unknown time  . sodium bicarbonate 650 MG tablet Take 2 tablets (1,300 mg total) by mouth 2 (two) times daily. 60 tablet 0 12/10/2014 at Unknown time  . warfarin (COUMADIN) 5  MG tablet Take 0.5 tablets (2.5 mg total) by mouth daily. Take as directed by coumadin clinic (Patient taking differently: Take 2.5-5 mg by mouth daily. 2.5 mg daily except 5 mg on Thursday) 90 tablet 1 12/09/2014 at Unknown time  . furosemide (LASIX) 80 MG tablet Take 1 tablet (80 mg total) by mouth 2 (two) times daily. (Patient not taking: Reported on 12/10/2014) 60 tablet 6 Not Taking at Unknown time    Assessment: 74 y/o male with CKD and hx Afib on chronic warfarin therapy admitted with several weeks of worsening LE edema, weight gain, and SOB. Pharmacy consulted to manage warfarin inpatient. INR is therapeutic at 2.3. Verified he has not had warfarin tonight. No bleeding noted, CBC wnl.  PTA: 2.5 mg daily except 5 mg on Thurs per  patient/wife; warfarin clinic note states 2.5 mg daily  Goal of Therapy:  INR 2-3 Monitor platelets by anticoagulation protocol: Yes   Plan:  - Warfarin 5 mg PO tonight - INR daily - Monitor for s/sx of bleeding  [facility], Pharm.D., BCPS Clinical Pharmacist Pager: [phone] 12/10/2014 9:39 PM

## 2014-12-10 NOTE — ED Notes (Signed)
Pt c/o SOB and leg swelling; pt sent here for by cards for fluid overload

## 2014-12-10 NOTE — H&P (Signed)
Primary cardiologist: Dr Golden Hurter MD Consulting cardiologist: Dr Carlyle Dolly MD  Clinical Summary [name] is a 74 y.o.male history of afib, HTN, HOCM, stage 5 CKD, chronic diastolic Hf, aortic stenosis admitted with SOB. Weight is up to 180 lbs, was 176 lbs during 11/19/14 clinic visit. Notes several weeks or worsening LE edema, weight gain, and SOB. Denies any chest pain. Compliant with diuretics and low sodium diet.   11/2014 echo LVEF 52%, diastolic function not described, severe AS  AVA VTI 0.47 with mean grad 44 Hgb 12.7, WBC 8.3, Plt 230, INR 2.30, Cr 5.46, BUN 70, K 4.5, BNP 3135 CXR mild CHF EKG afib, RBBB   Allergies  Allergen Reactions  . Amiodarone Other (See Comments)    Severely reduced DLCO  . Oxycodone Hcl Rash and Other (See Comments)    Rash and itching    Medications Scheduled Medications: . furosemide  40 mg Intravenous Once     Infusions:     PRN Medications:     Past Medical History  Diagnosis Date  . Paroxysmal atrial fibrillation     a. cardioversion 11/21/10 - Dr Radford Pax. b. 05/2013 - experiencing more SOB. 24-hr holter showed avg HR 49, max 82, min 38bpm. ETT showed baseline junctional bradycardia, HR incr only to 90bpm with drop in BP. Saw EP - amiodarone reduced and eventually d/c'd with improvement in dyspnea.  . Hypertension   . Warfarin anticoagulation   . CKD (chronic kidney disease), stage V     a. Followed by Dr. Jimmy Footman. b. IV fistula placed 04/17/14.  [name] Aortic valve stenosis     moderate by echo 06/2014  . Orthostatic hypotension   . Diastolic dysfunction   . Arthritis   . Self-catheterizes urinary bladder   . Junctional bradycardia   . NSVT (nonsustained ventricular tachycardia)     a. By holter 01/2014.  [name] Shortness of breath dyspnea   . PVD (peripheral vascular disease)   . Cardiomyopathy   . RTA (renal tubular acidosis)   . Hypomagnesemia   . Bladder cancer     a. s/p bladder resection, surgery to  recreate pouch from colon.  [name] A-V fistula     Past Surgical History  Procedure Laterality Date  . Cardioversion  11/21/2010  . Cystectomy    . Cholecystectomy    . Cataract extraction w/ intraocular lens  implant, bilateral    . Colonoscopy    . Av fistula placement Left 04/17/2014    Procedure: ARTERIOVENOUS (AV) FISTULA CREATION- LEFT UPPER ARM ;  Surgeon: Mal [name], MD;  Location: [facility] OR;  Service: Vascular;  Laterality: Left;  . Bladder surgery      Family History  Problem Relation Age of Onset  . CVA Mother   . Heart attack Father   . Heart disease Father   . Arrhythmia Father   . Alzheimer's disease Sister   . Multiple sclerosis Brother   . Alcohol abuse Father     Social History [name] reports that he quit smoking about 31 years ago. His smoking use included Cigarettes. He has a 66 pack-year smoking history. He has never used smokeless tobacco. [name] reports that he does not drink alcohol.  Review of Systems CONSTITUTIONAL: No weight loss, fever, chills, weakness or fatigue.  HEENT: Eyes: No visual loss, blurred vision, double vision or yellow sclerae. No hearing loss, sneezing, congestion, runny nose or sore throat.  SKIN: No rash or itching.  CARDIOVASCULAR:  per HPI RESPIRATORY: No cough or sputum.  GASTROINTESTINAL: No anorexia, nausea, vomiting or diarrhea. No abdominal pain or blood.  GENITOURINARY: no polyuria, no dysuria NEUROLOGICAL: No headache, dizziness, syncope, paralysis, ataxia, numbness or tingling in the extremities. No change in bowel or bladder control.  MUSCULOSKELETAL: No muscle, back pain, joint pain or stiffness.  HEMATOLOGIC: No anemia, bleeding or bruising.  LYMPHATICS: No enlarged nodes. No history of splenectomy.  PSYCHIATRIC: No history of depression or anxiety.      Physical Examination Blood pressure 121/89, pulse 108, temperature 97.8 F (36.6 C), temperature source Oral, resp. rate 18, SpO2 93 %. No intake or  output data in the 24 hours ending 12/10/14 1952  HEENT: sclera clear  Cardiovascular: irreg, 3/6 systolic murmur RUSB, JVD to angle of jaw  Respiratory: Crackles bilateral bases  GI: Abdomen soft, NT, ND  MSK: 2+ bilateral LE edema  Neuro: no focal deficits  Psych: appropriate affect   Lab Results  Basic Metabolic Panel:  Recent Labs Lab 12/09/14 1522 12/10/14 1827  NA 137 139  K 4.6 4.5  CL 100 102  CO2 26 23  GLUCOSE 116* 89  BUN 67* 70*  CREATININE 5.10* 5.46*  CALCIUM 9.6 9.6    Liver Function Tests: No results for input(s): AST, ALT, ALKPHOS, BILITOT, PROT, ALBUMIN in the last 168 hours.  CBC:  Recent Labs Lab 12/10/14 1827  WBC 8.3  NEUTROABS 5.7  HGB 12.7*  HCT 38.8*  MCV 108.1*  PLT 230    Cardiac Enzymes: No results for input(s): CKTOTAL, CKMB, CKMBINDEX, TROPONINI in the last 168 hours.  BNP: Invalid input(s): POCBNP    Impression/Recommendations 1. Acute on chronic diastolic heart failure - complicated by advanced renal dysfunction and severe aortic stenosis - received lasix 40mg  IV x 1 in ER, will write for additonal 40mg  IV tonight - follow urine output overnight, may need titration of lasix with consideration for metolazone pending response   2. CKD - Cr 3.85 6 months ago, has been trending up over the last few months and now at 5.10. - will need renal consult in AM  3. Severe aortic stenosis - will need consideration for intervention, either TAVR or surgical. Will need to have a decision made about his kidney function and potential for dialysis prior to any consideration.  4. Afib - continue rate control, coumadin for anticoag.   5. History of bladder CA - patient with history of bladder resection, he selfs cath through he reconstructed bladder.   Carlyle Dolly, M.D.

## 2014-12-10 NOTE — Telephone Encounter (Signed)
Informed patient of results and verbal understanding expressed.  Informed patient he has volume overload with AoC CHF exacerbation with markedly elevated BNP. Instructed patient to go to the hospital to be admitted for IV diuresis, renal consult with possible dialysis, and CVTS consult. Patient refuses at this time. He is running errands and is out of town. Strongly encouraged patient to return to [facility] and told him Bed Management will be called to have a bed available when he arrives. Patient adamantly refused, stating he is busy and he will not be back in [place] until 5-6PM tonight. Verified with patient he is to go to the ED for treatment when returns.

## 2014-12-10 NOTE — Telephone Encounter (Signed)
Notified Trish, Education officer, environmental, that patient should be coming to the ED tonight.

## 2014-12-10 NOTE — ED Notes (Signed)
Cardiology at bedside.

## 2014-12-11 ENCOUNTER — Encounter (HOSPITAL_COMMUNITY): Payer: Self-pay | Admitting: *Deleted

## 2014-12-11 DIAGNOSIS — I5033 Acute on chronic diastolic (congestive) heart failure: Secondary | ICD-10-CM | POA: Diagnosis not present

## 2014-12-11 LAB — CBC WITH DIFFERENTIAL/PLATELET
Basophils Absolute: 0 10*3/uL (ref 0.0–0.1)
Basophils Relative: 0 % (ref 0–1)
EOS ABS: 0.3 10*3/uL (ref 0.0–0.7)
EOS PCT: 4 % (ref 0–5)
HCT: 35.6 % — ABNORMAL LOW (ref 39.0–52.0)
HEMOGLOBIN: 11.6 g/dL — AB (ref 13.0–17.0)
LYMPHS ABS: 2 10*3/uL (ref 0.7–4.0)
Lymphocytes Relative: 25 % (ref 12–46)
MCH: 34.5 pg — AB (ref 26.0–34.0)
MCHC: 32.6 g/dL (ref 30.0–36.0)
MCV: 106 fL — AB (ref 78.0–100.0)
MONO ABS: 0.7 10*3/uL (ref 0.1–1.0)
MONOS PCT: 8 % (ref 3–12)
NEUTROS PCT: 63 % (ref 43–77)
Neutro Abs: 5 10*3/uL (ref 1.7–7.7)
Platelets: 213 10*3/uL (ref 150–400)
RBC: 3.36 MIL/uL — AB (ref 4.22–5.81)
RDW: 18.8 % — ABNORMAL HIGH (ref 11.5–15.5)
WBC: 8 10*3/uL (ref 4.0–10.5)

## 2014-12-11 LAB — BASIC METABOLIC PANEL
Anion gap: 12 (ref 5–15)
BUN: 72 mg/dL — AB (ref 6–20)
CO2: 25 mmol/L (ref 22–32)
Calcium: 9.1 mg/dL (ref 8.9–10.3)
Chloride: 102 mmol/L (ref 101–111)
Creatinine, Ser: 5.48 mg/dL — ABNORMAL HIGH (ref 0.61–1.24)
GFR calc Af Amer: 11 mL/min — ABNORMAL LOW (ref >60)
GFR, EST NON AFRICAN AMERICAN: 9 mL/min — AB (ref >60)
GLUCOSE: 99 mg/dL (ref 65–99)
Potassium: 4.9 mmol/L (ref 3.5–5.1)
Sodium: 139 mmol/L (ref 135–145)

## 2014-12-11 LAB — BRAIN NATRIURETIC PEPTIDE: B Natriuretic Peptide: 3973.1 pg/mL — ABNORMAL HIGH (ref 0.0–100.0)

## 2014-12-11 LAB — PROTIME-INR
INR: 2.39 — AB (ref 0.00–1.49)
Prothrombin Time: 25.8 seconds — ABNORMAL HIGH (ref 11.6–15.2)

## 2014-12-11 LAB — MAGNESIUM: MAGNESIUM: 2.5 mg/dL — AB (ref 1.7–2.4)

## 2014-12-11 MED ORDER — FUROSEMIDE 80 MG PO TABS
80.0000 mg | ORAL_TABLET | Freq: Two times a day (BID) | ORAL | Status: DC
  Administered 2014-12-11 – 2014-12-14 (×6): 80 mg via ORAL
  Filled 2014-12-11 (×8): qty 1

## 2014-12-11 MED ORDER — PANCRELIPASE (LIP-PROT-AMYL) 12000-38000 UNITS PO CPEP
24000.0000 [IU] | ORAL_CAPSULE | Freq: Three times a day (TID) | ORAL | Status: DC
  Administered 2014-12-11 – 2014-12-22 (×32): 24000 [IU] via ORAL
  Filled 2014-12-11 (×37): qty 2

## 2014-12-11 MED ORDER — SEVELAMER CARBONATE 800 MG PO TABS
800.0000 mg | ORAL_TABLET | Freq: Three times a day (TID) | ORAL | Status: DC
  Administered 2014-12-11 – 2014-12-22 (×32): 800 mg via ORAL
  Filled 2014-12-11 (×36): qty 1

## 2014-12-11 MED ORDER — LOPERAMIDE HCL 2 MG PO CAPS
2.0000 mg | ORAL_CAPSULE | ORAL | Status: DC | PRN
  Administered 2014-12-11 – 2014-12-20 (×13): 2 mg via ORAL
  Filled 2014-12-11 (×13): qty 1

## 2014-12-11 MED ORDER — WARFARIN SODIUM 2.5 MG PO TABS
2.5000 mg | ORAL_TABLET | Freq: Once | ORAL | Status: AC
  Administered 2014-12-11: 2.5 mg via ORAL
  Filled 2014-12-11: qty 1

## 2014-12-11 NOTE — Progress Notes (Signed)
[name] for warfarin Indication: atrial fibrillation  Allergies  Allergen Reactions  . Amiodarone Other (See Comments)    Severely reduced DLCO  . Oxycodone Hcl Rash and Other (See Comments)    Rash and itching    Patient Measurements: Height: 5\' 8"  (172.7 cm) Weight: 173 lb 14.4 oz (78.881 kg) (b scale) IBW/kg (Calculated) : 68.4  Vital Signs: Temp: 97.5 F (36.4 C) (08/12 0455) Temp Source: Oral (08/12 0455) BP: 142/85 mmHg (08/12 0946) Pulse Rate: 94 (08/12 0946)  Labs:  Recent Labs  12/09/14 1522 12/10/14 1827 12/11/14 0320  HGB  --  12.7* 11.6*  HCT  --  38.8* 35.6*  PLT  --  230 213  LABPROT  --  25.1* 25.8*  INR  --  2.30* 2.39*  CREATININE 5.10* 5.46* 5.48*    Estimated Creatinine Clearance: 11.4 mL/min (by C-G formula based on Cr of 5.48).   Medical History: Past Medical History  Diagnosis Date  . Paroxysmal atrial fibrillation     a. cardioversion 11/21/10 - Dr Radford Pax. b. 05/2013 - experiencing more SOB. 24-hr holter showed avg HR 49, max 82, min 38bpm. ETT showed baseline junctional bradycardia, HR incr only to 90bpm with drop in BP. Saw EP - amiodarone reduced and eventually d/c'd with improvement in dyspnea.  . Hypertension   . Warfarin anticoagulation   . CKD (chronic kidney disease), stage V     a. Followed by Dr. Jimmy Footman. b. IV fistula placed 04/17/14.  [name] Aortic valve stenosis     moderate by echo 06/2014  . Orthostatic hypotension   . Diastolic dysfunction   . Arthritis   . Self-catheterizes urinary bladder   . Junctional bradycardia   . NSVT (nonsustained ventricular tachycardia)     a. By holter 01/2014.  [name] Shortness of breath dyspnea   . PVD (peripheral vascular disease)   . Cardiomyopathy   . RTA (renal tubular acidosis)   . Hypomagnesemia   . Bladder cancer     a. 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  [name] A-V fistula     Medications:  Prescriptions prior to  admission  Medication Sig Dispense Refill Last Dose  . calcitRIOL (ROCALTROL) 0.25 MCG capsule Take 0.25 mcg by mouth daily.   12/10/2014 at Unknown time  . diltiazem (CARDIZEM CD) 180 MG 24 hr capsule Take 180 mg by mouth at bedtime.   12/09/2014 at Unknown time  . ferrous sulfate 325 (65 FE) MG tablet Take 325 mg by mouth daily.   12/10/2014 at Unknown time  . furosemide (LASIX) 40 MG tablet Take 40 mg by mouth 2 (two) times daily.   12/09/2014  . Loperamide HCl (IMODIUM PO) Take 1 tablet by mouth 2 (two) times daily.   12/10/2014 at Unknown time  . metoprolol succinate (TOPROL-XL) 100 MG 24 hr tablet Take 50 mg by mouth daily.   12/10/2014 at Unknown time  . Multiple Vitamins-Minerals (CENTRUM SILVER ULTRA MENS PO) Take 1 tablet by mouth daily. Take daily   12/10/2014 at Unknown time  . Pancrelipase, Lip-Prot-Amyl, (CREON) 24000 UNITS CPEP Take one (1) capsule by mouth with each meal   12/10/2014 at Unknown time  . PRESCRIPTION MEDICATION Take 1 tablet by mouth daily. (4-STRAIN PROBIOTIC)   12/10/2014 at Unknown time  . sevelamer carbonate (RENVELA) 800 MG tablet Take one (1) tablet by mouth with each meal   12/10/2014 at Unknown time  . sodium bicarbonate 650 MG tablet Take 2 tablets (  1,300 mg total) by mouth 2 (two) times daily. 60 tablet 0 12/10/2014 at Unknown time  . warfarin (COUMADIN) 5 MG tablet Take 0.5 tablets (2.5 mg total) by mouth daily. Take as directed by coumadin clinic (Patient taking differently: Take 2.5-5 mg by mouth daily. 2.5 mg daily except 5 mg on Thursday) 90 tablet 1 12/09/2014 at Unknown time  . furosemide (LASIX) 80 MG tablet Take 1 tablet (80 mg total) by mouth 2 (two) times daily. (Patient not taking: Reported on 12/10/2014) 60 tablet 6 Not Taking at Unknown time    Assessment: 74 y/o male with CKD5 and hx Afib on chronic warfarin therapy admitted with several weeks of worsening LE edema, weight gain, and SOB. Pharmacy consulted to manage warfarin inpatient. INR is therapeutic,  stable at 2.3>>2.39. Verified he has not had warfarin tonight. No bleeding noted, CBC ok.  PTA: 2.5 mg daily except 5 mg on Thurs per patient/wife; warfarin clinic note states 2.5 mg daily  Goal of Therapy:  INR 2-3 Monitor platelets by anticoagulation protocol: Yes   Plan:  - Warfarin 2.5 mg PO tonight - INR daily - Monitor for s/sx of bleeding  Elicia Lamp, PharmD Clinical Pharmacist Pager [phone] 12/11/2014 9:56 AM

## 2014-12-11 NOTE — Progress Notes (Signed)
Patient Name: [name] Tine Date of Encounter: 12/11/2014   SUBJECTIVE  Denies chest pain. Stable SOB.   CURRENT MEDS . calcitRIOL  0.25 mcg Oral Daily  . diltiazem  180 mg Oral QHS  . ferrous sulfate  325 mg Oral Daily  . lipase/protease/amylase  24,000 Units Oral TID WC  . metoprolol succinate  50 mg Oral Daily  . sevelamer carbonate  800 mg Oral TID WC  . sodium bicarbonate  1,300 mg Oral BID  . sodium chloride  3 mL Intravenous Q12H  . Warfarin - Pharmacist Dosing Inpatient   Does not apply q1800    OBJECTIVE  Filed Vitals:   12/10/14 2045 12/10/14 2137 12/10/14 2154 12/11/14 0455  BP: 145/85  113/95 148/90  Pulse:   120 105  Temp:   97.7 F (36.5 C) 97.5 F (36.4 C)  TempSrc:   Oral Oral  Resp: 16 20  20   Height:  5\' 8"  (1.727 m)    Weight:  174 lb 9.6 oz (79.198 kg)  173 lb 14.4 oz (78.881 kg)  SpO2: 98%  96% 93%    Intake/Output Summary (Last 24 hours) at 12/11/14 0926 Last data filed at 12/11/14 0914  Gross per 24 hour  Intake    840 ml  Output    600 ml  Net    240 ml   Filed Weights   12/10/14 2137 12/11/14 0455  Weight: 174 lb 9.6 oz (79.198 kg) 173 lb 14.4 oz (78.881 kg)    PHYSICAL EXAM  General: Pleasant, NAD. Neuro: Alert and oriented X 3. Moves all extremities spontaneously. Psych: Normal affect. HEENT:  Normal  Neck: Supple without bruits. + JVD. Lungs:  Resp regular and unlabored. Bibasilar crackle.  Heart: irregular no s3, s4. 3/6 sytolic murmur at base Abdomen: Soft, non-tender, non-distended, BS + x 4.  Extremities: No clubbing, cyanosis. 2-3+ LE edema. DP/PT/Radials 2+ and equal bilaterally. L arm fistula.   Accessory Clinical Findings  CBC  Recent Labs  12/10/14 1827 12/11/14 0320  WBC 8.3 8.0  NEUTROABS 5.7 5.0  HGB 12.7* 11.6*  HCT 38.8* 35.6*  MCV 108.1* 106.0*  PLT 230 540   Basic Metabolic Panel  Recent Labs  12/10/14 1827 12/11/14 0320  NA 139 139  K 4.5 4.9  CL 102 102  CO2 23 25  GLUCOSE 89 99    BUN 70* 72*  CREATININE 5.46* 5.48*  CALCIUM 9.6 9.1  MG  --  2.5*    TELE  afib at rate of 70-100s. At times elevates to 120s.   Radiology/Studies  Dg Chest 2 View  12/10/2014   CLINICAL DATA:  Cough x3 months. SOB x8 months. No chest pain.Hx of HTN, CKD, PAF, Cardiomyopathy, aortic valve stenosis.  EXAM: CHEST  2 VIEW  COMPARISON:  10/13/2014  FINDINGS: Cardiopericardial silhouette is mildly enlarged. Aorta is mildly uncoiled. No mediastinal or hilar masses or evidence of adenopathy.  Mild interstitial prominence in the lower lungs. Minimal pleural effusions. No lung consolidation to suggest pneumonia. No pneumothorax.  Bony thorax is demineralized but grossly intact.  IMPRESSION: 1. Since prior study, mild interstitial thickening has developed in the lower lungs along with minimal pleural effusions. There is stable mild cardiomegaly. Findings suggest mild congestive heart failure. No convincing pneumonia.   Electronically Signed   By: Lajean Manes M.D.   On: 12/10/2014 19:14    Echo 12/03/14 LV EF: 55%  ------------------------------------------------------------------- Indications:   Cardiomyopathy.  ------------------------------------------------------------------- History:  PMH: Acquired from  the patient and from the patient&'s chart. Atrial fibrillation. Cardiomyopathy. Aortic valve disease. Chronic kidney disease. Risk factors: Hypertension.  ------------------------------------------------------------------- Study Conclusions  - Left ventricle: There was moderate concentric hypertrophy. The estimated ejection fraction was 55%. - Aortic valve: There was severe stenosis. There was mild regurgitation. - Mitral valve: Severely calcified annulus. There was mild to moderate regurgitation. Valve area by continuity equation (using LVOT flow): 1.25 cm^2. - Left atrium: The atrium was severely dilated. - Right atrium: The atrium was mildly dilated. - Atrial  septum: No defect or patent foramen ovale was identified.  ASSESSMENT AND PLAN   1. Acute on chronic diastolic heart failure - complicated by advanced renal dysfunction and severe aortic stenosis - received lasix 40mg  IV x 1 in ER and additonal 40mg  IV last night - Only 152ml diuresis so far. --> Creatinine worsen further to 5.48.  - Will need titration of lasix with consideration for metolazone -  Not on any schedule diuretics today - will discuss with MD.   2. CKD - Cr 3.85 6 months ago, has been trending up over the last few months and now at 5.48 form 4.58 on 2 dose of IV lasix.  - Requested renal consult. He follows Dr. Jimmy Footman. Had L arm fistula in place but it is not currently being used  3. Severe aortic stenosis - Per Dr. Theodosia Blender note, he has been referred to CVTS to see if he is a candidate for either open AVR or TAVR. The patient was referred to Dr. Cyndia Bent and has upcoming appointment 12/22/14. Patient like to have consult while he is in hospital. MD to decide.   4. Afib - Rate controlled, however at times elevated to 120s.  - He is on Cardizem CD 180mg , Toprol XL 50mg  and coumadin per pharmacy. Consider increasing dose for better rate control, however hx of junctional bradycardia.  5. H/o junctional bradycardia - Patient knows to observe closely for symptoms of low HR.  6. History of bladder CA - patient with history of bladder resection, he selfs cath through he reconstructed bladder.  Jarrett Soho PA-C Pager [phone] Agree with assessment and plan as noted above. The patient is in no acute distress at rest. He states he has marked dyspnea with minimal exertion. He has advanced kidney disease with a mature AV fistula in left arm. Nephrology to see. Will defer diuretic management to nephrology. Would see how he does over the weekend regarding renal issues then make decision on proceeding with inpatient vs outpatient TAVR workup next week.

## 2014-12-11 NOTE — Consult Note (Signed)
[name] is an 74 y.o. male referred by Dr Harl Bowie   Chief Complaint: CKD 5, Sec HPTH HPI: 74yo WM admitted 12/10/14 for progressive SOB and edema.  Has CKD 5 followed by Dr Deterding with last Scr 5.36 in 6/16.  Currently Scr is stable at  5.48.  His biggest issue is worsening DOE that is most likely related to severe AS.  He has only been on modest doses of lasix.  Denies any overt uremic sxs.  Past Medical History  Diagnosis Date  . Paroxysmal atrial fibrillation     a. cardioversion 11/21/10 - Dr Radford Pax. b. 05/2013 - experiencing more SOB. 24-hr holter showed avg HR 49, max 82, min 38bpm. ETT showed baseline junctional bradycardia, HR incr only to 90bpm with drop in BP. Saw EP - amiodarone reduced and eventually d/c'd with improvement in dyspnea.  . Hypertension   . Warfarin anticoagulation   . CKD (chronic kidney disease), stage V     a. Followed by Dr. Jimmy Footman. b. IV fistula placed 04/17/14.  [name] Aortic valve stenosis     moderate by echo 06/2014  . Orthostatic hypotension   . Diastolic dysfunction   . Arthritis   . Self-catheterizes urinary bladder   . Junctional bradycardia   . NSVT (nonsustained ventricular tachycardia)     a. By holter 01/2014.  [name] Shortness of breath dyspnea   . PVD (peripheral vascular disease)   . Cardiomyopathy   . RTA (renal tubular acidosis)   . Hypomagnesemia   . Bladder cancer     a. 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  [name] A-V fistula     Past Surgical History  Procedure Laterality Date  . Cardioversion  11/21/2010  . Cystectomy    . Cholecystectomy    . Cataract extraction w/ intraocular lens  implant, bilateral    . Colonoscopy    . Av fistula placement Left 04/17/2014    Procedure: ARTERIOVENOUS (AV) FISTULA CREATION- LEFT UPPER ARM ;  Surgeon: Mal [name], MD;  Location: [facility] OR;  Service: Vascular;  Laterality: Left;  . Bladder surgery      Family History  Problem Relation Age of Onset  . CVA Mother   .  Heart attack Father   . Heart disease Father   . Arrhythmia Father   . Alzheimer's disease Sister   . Multiple sclerosis Brother   . Alcohol abuse Father   No FH renal ds  Social History:  reports that he quit smoking about 31 years ago. His smoking use included Cigarettes. He has a 66 pack-year smoking history. He has never used smokeless tobacco. He reports that he does not drink alcohol or use illicit drugs. Married and lives with wife in [place],  Retired Charity fundraiser  Allergies:  Allergies  Allergen Reactions  . Amiodarone Other (See Comments)    Severely reduced DLCO  . Oxycodone Hcl Rash and Other (See Comments)    Rash and itching    Medications Prior to Admission  Medication Sig Dispense Refill  . calcitRIOL (ROCALTROL) 0.25 MCG capsule Take 0.25 mcg by mouth daily.    [name] diltiazem (CARDIZEM CD) 180 MG 24 hr capsule Take 180 mg by mouth at bedtime.    . ferrous sulfate 325 (65 FE) MG tablet Take 325 mg by mouth daily.    . furosemide (LASIX) 40 MG tablet Take 40 mg by mouth 2 (two) times daily.    . Loperamide HCl (IMODIUM PO) Take 1 tablet  by mouth 2 (two) times daily.    . metoprolol succinate (TOPROL-XL) 100 MG 24 hr tablet Take 50 mg by mouth daily.    . Multiple Vitamins-Minerals (CENTRUM SILVER ULTRA MENS PO) Take 1 tablet by mouth daily. Take daily    . Pancrelipase, Lip-Prot-Amyl, (CREON) 24000 UNITS CPEP Take one (1) capsule by mouth with each meal    . PRESCRIPTION MEDICATION Take 1 tablet by mouth daily. (4-STRAIN PROBIOTIC)    . sevelamer carbonate (RENVELA) 800 MG tablet Take one (1) tablet by mouth with each meal    . sodium bicarbonate 650 MG tablet Take 2 tablets (1,300 mg total) by mouth 2 (two) times daily. 60 tablet 0  . warfarin (COUMADIN) 5 MG tablet Take 0.5 tablets (2.5 mg total) by mouth daily. Take as directed by coumadin clinic (Patient taking differently: Take 2.5-5 mg by mouth daily. 2.5 mg daily except 5 mg on Thursday) 90 tablet 1  . furosemide (LASIX)  80 MG tablet Take 1 tablet (80 mg total) by mouth 2 (two) times daily. (Patient not taking: Reported on 12/10/2014) 60 tablet 6     Lab Results: UA: ND   Recent Labs  12/10/14 1827 12/11/14 0320  WBC 8.3 8.0  HGB 12.7* 11.6*  HCT 38.8* 35.6*  PLT 230 213   BMET  Recent Labs  12/09/14 1522 12/10/14 1827 12/11/14 0320  NA 137 139 139  K 4.6 4.5 4.9  CL 100 102 102  CO2 26 23 25   GLUCOSE 116* 89 99  BUN 67* 70* 72*  CREATININE 5.10* 5.46* 5.48*  CALCIUM 9.6 9.6 9.1   LFT No results for input(s): PROT, ALBUMIN, AST, ALT, ALKPHOS, BILITOT, BILIDIR, IBILI in the last 72 hours. Dg Chest 2 View  12/10/2014   CLINICAL DATA:  Cough x3 months. SOB x8 months. No chest pain.Hx of HTN, CKD, PAF, Cardiomyopathy, aortic valve stenosis.  EXAM: CHEST  2 VIEW  COMPARISON:  10/13/2014  FINDINGS: Cardiopericardial silhouette is mildly enlarged. Aorta is mildly uncoiled. No mediastinal or hilar masses or evidence of adenopathy.  Mild interstitial prominence in the lower lungs. Minimal pleural effusions. No lung consolidation to suggest pneumonia. No pneumothorax.  Bony thorax is demineralized but grossly intact.  IMPRESSION: 1. Since prior study, mild interstitial thickening has developed in the lower lungs along with minimal pleural effusions. There is stable mild cardiomegaly. Findings suggest mild congestive heart failure. No convincing pneumonia.   Electronically Signed   By: Lajean Manes M.D.   On: 12/10/2014 19:14    ROS: No change in vision No CP + DOE No Abd pain.  Had issues with diarrhea but better with crean + edema over last few days PTA No dysuria  No new arthritic CO  PHYSICAL EXAM: Blood pressure 142/85, pulse 94, temperature 97.5 F (36.4 C), temperature source Oral, resp. rate 20, height 5\' 8"  (1.727 m), weight 78.881 kg (173 lb 14.4 oz), SpO2 97 %. HEENT: PERRLA EOMI NECK:No JVD LUNGS:Clear CARDIAC:irreg,irreg 3/6 systolic m LSB ABD:+ BS NTND No HSM EXT:2+ edema  LUA  AVF + bruit NEURO:CNI Ox3 no asterixis  Assessment: 1. CKD 5.  His renal fx is stable over the last 2 months contrary to the current notes 2. Sec HPTH on calcitriol 3. Severe AS 4. Volume overload 5. Mild anemia  He has not required ESA PLAN: 1. Check PO4 and PTH 2. His Sxs are more reflective of his AS and he wants to proceed with a cardiac plan to take care of  it.  He knows there is a good chance he will end up on HD but he is willing to accept this as his current QOL is severely affected. 3. Resume diuretics  4. Daily labs Nicole Defino T 12/11/2014, 12:36 PM

## 2014-12-11 NOTE — Telephone Encounter (Signed)
Patient is admitted at the hospital at this time.

## 2014-12-12 DIAGNOSIS — I5033 Acute on chronic diastolic (congestive) heart failure: Secondary | ICD-10-CM | POA: Diagnosis not present

## 2014-12-12 DIAGNOSIS — I350 Nonrheumatic aortic (valve) stenosis: Secondary | ICD-10-CM | POA: Diagnosis not present

## 2014-12-12 LAB — RENAL FUNCTION PANEL
ANION GAP: 11 (ref 5–15)
Albumin: 3.3 g/dL — ABNORMAL LOW (ref 3.5–5.0)
BUN: 73 mg/dL — AB (ref 6–20)
CHLORIDE: 102 mmol/L (ref 101–111)
CO2: 24 mmol/L (ref 22–32)
CREATININE: 5.4 mg/dL — AB (ref 0.61–1.24)
Calcium: 9 mg/dL (ref 8.9–10.3)
GFR calc Af Amer: 11 mL/min — ABNORMAL LOW (ref >60)
GFR calc non Af Amer: 9 mL/min — ABNORMAL LOW (ref >60)
Glucose, Bld: 104 mg/dL — ABNORMAL HIGH (ref 65–99)
PHOSPHORUS: 6.4 mg/dL — AB (ref 2.5–4.6)
POTASSIUM: 4.6 mmol/L (ref 3.5–5.1)
SODIUM: 137 mmol/L (ref 135–145)

## 2014-12-12 LAB — PROTIME-INR
INR: 2.44 — ABNORMAL HIGH (ref 0.00–1.49)
Prothrombin Time: 26.2 seconds — ABNORMAL HIGH (ref 11.6–15.2)

## 2014-12-12 MED ORDER — GUAIFENESIN-DM 100-10 MG/5ML PO SYRP
5.0000 mL | ORAL_SOLUTION | ORAL | Status: DC | PRN
  Administered 2014-12-12 – 2014-12-13 (×2): 5 mL via ORAL
  Filled 2014-12-12 (×2): qty 5

## 2014-12-12 MED ORDER — WARFARIN SODIUM 2.5 MG PO TABS
2.5000 mg | ORAL_TABLET | Freq: Once | ORAL | Status: AC
  Administered 2014-12-12: 2.5 mg via ORAL
  Filled 2014-12-12: qty 1

## 2014-12-12 NOTE — Progress Notes (Signed)
ANTICOAGULATION CONSULT NOTE - Follow Up Consult  Pharmacy Consult for coumadin Indication: atrial fibrillation  Allergies  Allergen Reactions  . Amiodarone Other (See Comments)    Severely reduced DLCO  . Oxycodone Hcl Rash and Other (See Comments)    Rash and itching    Patient Measurements: Height: 5\' 8"  (172.7 cm) Weight: 172 lb 14.4 oz (78.427 kg) (b scale) IBW/kg (Calculated) : 68.4 Heparin Dosing Weight:   Vital Signs: Temp: 98.6 F (37 C) (08/13 0631) Temp Source: Oral (08/13 0631) BP: 146/68 mmHg (08/13 0631)  Labs:  Recent Labs  12/10/14 1827 12/11/14 0320 12/12/14 0320  HGB 12.7* 11.6*  --   HCT 38.8* 35.6*  --   PLT 230 213  --   LABPROT 25.1* 25.8* 26.2*  INR 2.30* 2.39* 2.44*  CREATININE 5.46* 5.48* 5.40*    Estimated Creatinine Clearance: 11.6 mL/min (by C-G formula based on Cr of 5.4).   Medications:  Scheduled:  . calcitRIOL  0.25 mcg Oral Daily  . diltiazem  180 mg Oral QHS  . ferrous sulfate  325 mg Oral Daily  . furosemide  80 mg Oral BID  . lipase/protease/amylase  24,000 Units Oral TID WC  . metoprolol succinate  50 mg Oral Daily  . sevelamer carbonate  800 mg Oral TID WC  . sodium bicarbonate  1,300 mg Oral BID  . sodium chloride  3 mL Intravenous Q12H  . Warfarin - Pharmacist Dosing Inpatient   Does not apply q1800   Infusions:    Assessment: 74 yo male with afib is currently on therapeutic coumadin.  INR today is 2.44.  Goal of Therapy:  INR 2-3 Monitor platelets by anticoagulation protocol: Yes   Plan:  - coumadin 2.5 mg po x1 - INR in am  Shawntia Mangal, Tsz-Yin 12/12/2014,11:36 AM

## 2014-12-12 NOTE — Progress Notes (Signed)
    SUBJECTIVE:  Still with SOB that is not better than it was on admission.     PHYSICAL EXAM Filed Vitals:   12/11/14 0946 12/11/14 1809 12/11/14 2226 12/12/14 0631  BP: 142/85 141/103 137/87 146/68  Pulse: 94 41 95   Temp:  97.7 F (36.5 C)  98.6 F (37 C)  TempSrc:  Oral  Oral  Resp: 20 20 18 20   Height:      Weight:    172 lb 14.4 oz (78.427 kg)  SpO2: 97% 99% 98% 95%   General:  No acute distress at rest Lungs:  Clear Heart:  Irregular Abdomen:  Positive bowel sounds, no rebound no guarding Extremities:  Mild/moderate edema.   LABS:  Results for orders placed or performed during the hospital encounter of 12/10/14 (from the past 24 hour(s))  Renal function panel     Status: Abnormal   Collection Time: 12/12/14  3:20 AM  Result Value Ref Range   Sodium 137 135 - 145 mmol/L   Potassium 4.6 3.5 - 5.1 mmol/L   Chloride 102 101 - 111 mmol/L   CO2 24 22 - 32 mmol/L   Glucose, Bld 104 (H) 65 - 99 mg/dL   BUN 73 (H) 6 - 20 mg/dL   Creatinine, Ser 5.40 (H) 0.61 - 1.24 mg/dL   Calcium 9.0 8.9 - 10.3 mg/dL   Phosphorus 6.4 (H) 2.5 - 4.6 mg/dL   Albumin 3.3 (L) 3.5 - 5.0 g/dL   GFR calc non Af Amer 9 (L) >60 mL/min   GFR calc Af Amer 11 (L) >60 mL/min   Anion gap 11 5 - 15  Protime-INR     Status: Abnormal   Collection Time: 12/12/14  3:20 AM  Result Value Ref Range   Prothrombin Time 26.2 (H) 11.6 - 15.2 seconds   INR 2.44 (H) 0.00 - 1.49    Intake/Output Summary (Last 24 hours) at 12/12/14 1308 Last data filed at 12/12/14 0940  Gross per 24 hour  Intake    960 ml  Output   1300 ml  Net   -340 ml    EKG:    ASSESSMENT AND PLAN:   1. Acute on chronic diastolic heart failure He is on oral Lasix.  Diuresis is difficult and renal does not think that he needs dialysis for volume management at this time.  I will continue current dose of diuretic.  I left a message with Dr. Burt Knack to see the patient to discuss possible TAVR.  The patient is aware that this work up  requires multiple studies prior to deciding on the appropriate approach.  He would need cath and CT.  Dr. Burt Knack to see to discuss timing of these studies.     2. CKD Renal following.  The patient is aware that any intervention on the aortic valve is likely to lead to progressive renal dysfunction and dialysis.    3. Severe aortic stenosis As above.   4. Afib Rate controlled.     [name] 12/12/2014 1:08 PM

## 2014-12-12 NOTE — Progress Notes (Signed)
S: no uremic Sxs O:BP 146/68 mmHg  Pulse 95  Temp(Src) 98.6 F (37 C) (Oral)  Resp 20  Ht 5\' 8"  (1.727 m)  Wt 78.427 kg (172 lb 14.4 oz)  BMI 26.30 kg/m2  SpO2 95%  Intake/Output Summary (Last 24 hours) at 12/12/14 1024 Last data filed at 12/12/14 0940  Gross per 24 hour  Intake    960 ml  Output   1300 ml  Net   -340 ml   Weight change: -0.771 kg (-1 lb 11.2 oz) YTK:PTWSF and alert KCL:EXNT irreg 3/6 systolic M LSB Resp:clear Abd:+ BS NTND Ext: 1+ edema  LUA AVF + bruit NEURO:CNI Ox3 no asterixis   . calcitRIOL  0.25 mcg Oral Daily  . diltiazem  180 mg Oral QHS  . ferrous sulfate  325 mg Oral Daily  . furosemide  80 mg Oral BID  . lipase/protease/amylase  24,000 Units Oral TID WC  . metoprolol succinate  50 mg Oral Daily  . sevelamer carbonate  800 mg Oral TID WC  . sodium bicarbonate  1,300 mg Oral BID  . sodium chloride  3 mL Intravenous Q12H  . Warfarin - Pharmacist Dosing Inpatient   Does not apply q1800   Dg Chest 2 View  12/10/2014   CLINICAL DATA:  Cough x3 months. SOB x8 months. No chest pain.Hx of HTN, CKD, PAF, Cardiomyopathy, aortic valve stenosis.  EXAM: CHEST  2 VIEW  COMPARISON:  10/13/2014  FINDINGS: Cardiopericardial silhouette is mildly enlarged. Aorta is mildly uncoiled. No mediastinal or hilar masses or evidence of adenopathy.  Mild interstitial prominence in the lower lungs. Minimal pleural effusions. No lung consolidation to suggest pneumonia. No pneumothorax.  Bony thorax is demineralized but grossly intact.  IMPRESSION: 1. Since prior study, mild interstitial thickening has developed in the lower lungs along with minimal pleural effusions. There is stable mild cardiomegaly. Findings suggest mild congestive heart failure. No convincing pneumonia.   Electronically Signed   By: [name] M.D.   On: 12/10/2014 19:14   BMET    Component Value Date/Time   NA 137 12/12/2014 0320   K 4.6 12/12/2014 0320   CL 102 12/12/2014 0320   CO2 24 12/12/2014  0320   GLUCOSE 104* 12/12/2014 0320   BUN 73* 12/12/2014 0320   CREATININE 5.40* 12/12/2014 0320   CALCIUM 9.0 12/12/2014 0320   GFRNONAA 9* 12/12/2014 0320   GFRAA 11* 12/12/2014 0320   CBC    Component Value Date/Time   WBC 8.0 12/11/2014 0320   RBC 3.36* 12/11/2014 0320   HGB 11.6* 12/11/2014 0320   HCT 35.6* 12/11/2014 0320   PLT 213 12/11/2014 0320   MCV 106.0* 12/11/2014 0320   MCH 34.5* 12/11/2014 0320   MCHC 32.6 12/11/2014 0320   RDW 18.8* 12/11/2014 0320   LYMPHSABS 2.0 12/11/2014 0320   MONOABS 0.7 12/11/2014 0320   EOSABS 0.3 12/11/2014 0320   BASOSABS 0.0 12/11/2014 0320     Assessment:  1. CKD 5.  Renal fx stable 2. Sec HPTH on calcitriol 3. Severe AS  Plan: 1.  Needs discussion with cardiology.  He is ready to proceed with inpatient eval for TAVR ASAP 2. Cont PO lasix   [name] T

## 2014-12-13 DIAGNOSIS — I5033 Acute on chronic diastolic (congestive) heart failure: Secondary | ICD-10-CM | POA: Diagnosis not present

## 2014-12-13 DIAGNOSIS — I350 Nonrheumatic aortic (valve) stenosis: Secondary | ICD-10-CM | POA: Diagnosis not present

## 2014-12-13 LAB — PROTIME-INR
INR: 2.7 — ABNORMAL HIGH (ref 0.00–1.49)
Prothrombin Time: 28.3 seconds — ABNORMAL HIGH (ref 11.6–15.2)

## 2014-12-13 LAB — PARATHYROID HORMONE, INTACT (NO CA): PTH: 98 pg/mL — AB (ref 15–65)

## 2014-12-13 NOTE — Progress Notes (Addendum)
ANTICOAGULATION CONSULT NOTE - Follow Up Consult  Pharmacy Consult for coumadin Indication: atrial fibrillation  Allergies  Allergen Reactions  . Amiodarone Other (See Comments)    Severely reduced DLCO  . Oxycodone Hcl Rash and Other (See Comments)    Rash and itching    Patient Measurements: Height: 5\' 8"  (172.7 cm) Weight: 175 lb 3.2 oz (79.47 kg) IBW/kg (Calculated) : 68.4 Heparin Dosing Weight:   Vital Signs: Temp: 98.1 F (36.7 C) (08/14 0500) Temp Source: Oral (08/14 0500) BP: 151/93 mmHg (08/14 0500) Pulse Rate: 91 (08/14 0500)  Labs:  Recent Labs  12/10/14 1827 12/11/14 0320 12/12/14 0320 12/13/14 0259  HGB 12.7* 11.6*  --   --   HCT 38.8* 35.6*  --   --   PLT 230 213  --   --   LABPROT 25.1* 25.8* 26.2* 28.3*  INR 2.30* 2.39* 2.44* 2.70*  CREATININE 5.46* 5.48* 5.40*  --     Estimated Creatinine Clearance: 11.6 mL/min (by C-G formula based on Cr of 5.4).   Medications:  Scheduled:  . calcitRIOL  0.25 mcg Oral Daily  . diltiazem  180 mg Oral QHS  . ferrous sulfate  325 mg Oral Daily  . furosemide  80 mg Oral BID  . lipase/protease/amylase  24,000 Units Oral TID WC  . metoprolol succinate  50 mg Oral Daily  . sevelamer carbonate  800 mg Oral TID WC  . sodium bicarbonate  1,300 mg Oral BID  . sodium chloride  3 mL Intravenous Q12H   Infusions:    Assessment: 74 yo male with afib is currently on therapeutic coumadin.  INR today is 2.7. In anticipation of cath Monday or Tuesday, MD wants to hold coumadin tonight.  Goal of Therapy:  INR 2-3 Monitor platelets by anticoagulation protocol: Yes   Plan:  - follow up on resume of coumadin  Emera Bussie, Tsz-Yin 12/13/2014,11:26 AM

## 2014-12-13 NOTE — Progress Notes (Signed)
    SUBJECTIVE:  Still with SOB that is not better than it was on admission.      PHYSICAL EXAM Filed Vitals:   12/12/14 0631 12/12/14 1410 12/12/14 2008 12/13/14 0500  BP: 146/68 140/82 143/90 151/93  Pulse:  68 74 91  Temp: 98.6 F (37 C) 97.7 F (36.5 C) 98 F (36.7 C) 98.1 F (36.7 C)  TempSrc: Oral Oral Oral Oral  Resp: 20 20 20 20   Height:      Weight: 172 lb 14.4 oz (78.427 kg)   175 lb 3.2 oz (79.47 kg)  SpO2: 95% 97% 93% 96%   General:  No acute distress at rest Lungs:  Clear Heart:  Irregular Abdomen:  Positive bowel sounds, no rebound no guarding Extremities:  Mild edema slightly improved.   LABS:  Results for orders placed or performed during the hospital encounter of 12/10/14 (from the past 24 hour(s))  Protime-INR     Status: Abnormal   Collection Time: 12/13/14  2:59 AM  Result Value Ref Range   Prothrombin Time 28.3 (H) 11.6 - 15.2 seconds   INR 2.70 (H) 0.00 - 1.49    Intake/Output Summary (Last 24 hours) at 12/13/14 0949 Last data filed at 12/13/14 4920  Gross per 24 hour  Intake   1200 ml  Output   1350 ml  Net   -150 ml    EKG:    ASSESSMENT AND PLAN:   1. Acute on chronic diastolic heart failure   I will continue current dose of diuretic.  I left a message with Dr. Burt Knack to see the patient to discuss possible TAVR and he will see him tomorrow.  In anticipation of cath either Mon or Tues I will hold the warfarin.  The patient is aware that this work up requires multiple studies prior to deciding on the appropriate approach.  He would need cath and CT.  Dr. Burt Knack to see to discuss timing of these studies.     2. CKD Renal following.  The patient is aware that any intervention on the aortic valve is likely to lead to progressive renal dysfunction and dialysis.    3. Severe aortic stenosis As above.   4. Afib Rate controlled.   It was high yesterday AM.    Minus Breeding 12/13/2014 9:49 AM

## 2014-12-13 NOTE — Progress Notes (Signed)
S: no uremic Sxs O:BP 151/93 mmHg  Pulse 91  Temp(Src) 98.1 F (36.7 C) (Oral)  Resp 20  Ht 5\' 8"  (1.727 m)  Wt 79.47 kg (175 lb 3.2 oz)  BMI 26.65 kg/m2  SpO2 96%  Intake/Output Summary (Last 24 hours) at 12/13/14 0851 Last data filed at 12/13/14 0645  Gross per 24 hour  Intake   1080 ml  Output   1350 ml  Net   -270 ml   Weight change: 1.043 kg (2 lb 4.8 oz) PPI:RJJOA and alert CZY:SAYT irreg 3/6 systolic M LSB Resp:clear Abd:+ BS NTND Ext: tr-1+ edema  LUA AVF + bruit NEURO:CNI Ox3 no asterixis   . calcitRIOL  0.25 mcg Oral Daily  . diltiazem  180 mg Oral QHS  . ferrous sulfate  325 mg Oral Daily  . furosemide  80 mg Oral BID  . lipase/protease/amylase  24,000 Units Oral TID WC  . metoprolol succinate  50 mg Oral Daily  . sevelamer carbonate  800 mg Oral TID WC  . sodium bicarbonate  1,300 mg Oral BID  . sodium chloride  3 mL Intravenous Q12H  . Warfarin - Pharmacist Dosing Inpatient   Does not apply q1800   No results found. BMET    Component Value Date/Time   NA 137 12/12/2014 0320   K 4.6 12/12/2014 0320   CL 102 12/12/2014 0320   CO2 24 12/12/2014 0320   GLUCOSE 104* 12/12/2014 0320   BUN 73* 12/12/2014 0320   CREATININE 5.40* 12/12/2014 0320   CALCIUM 9.0 12/12/2014 0320   GFRNONAA 9* 12/12/2014 0320   GFRAA 11* 12/12/2014 0320   CBC    Component Value Date/Time   WBC 8.0 12/11/2014 0320   RBC 3.36* 12/11/2014 0320   HGB 11.6* 12/11/2014 0320   HCT 35.6* 12/11/2014 0320   PLT 213 12/11/2014 0320   MCV 106.0* 12/11/2014 0320   MCH 34.5* 12/11/2014 0320   MCHC 32.6 12/11/2014 0320   RDW 18.8* 12/11/2014 0320   LYMPHSABS 2.0 12/11/2014 0320   MONOABS 0.7 12/11/2014 0320   EOSABS 0.3 12/11/2014 0320   BASOSABS 0.0 12/11/2014 0320     Assessment:  1. CKD 5.  Renal fx stable 2. Sec HPTH on calcitriol 3. Severe AS  Plan: 1.  Note plans to have Dr Burt Knack discuss TAVR with pt.  He is well aware of the probability of needing to start HD 2.  Cont PO lasix   Mckade Gurka T

## 2014-12-14 ENCOUNTER — Encounter (HOSPITAL_COMMUNITY): Payer: Self-pay | Admitting: Thoracic Surgery (Cardiothoracic Vascular Surgery)

## 2014-12-14 DIAGNOSIS — I350 Nonrheumatic aortic (valve) stenosis: Secondary | ICD-10-CM

## 2014-12-14 LAB — RENAL FUNCTION PANEL
ALBUMIN: 3.2 g/dL — AB (ref 3.5–5.0)
ANION GAP: 14 (ref 5–15)
BUN: 74 mg/dL — ABNORMAL HIGH (ref 6–20)
CALCIUM: 9.4 mg/dL (ref 8.9–10.3)
CO2: 26 mmol/L (ref 22–32)
CREATININE: 5.05 mg/dL — AB (ref 0.61–1.24)
Chloride: 102 mmol/L (ref 101–111)
GFR calc non Af Amer: 10 mL/min — ABNORMAL LOW (ref >60)
GFR, EST AFRICAN AMERICAN: 12 mL/min — AB (ref >60)
GLUCOSE: 87 mg/dL (ref 65–99)
PHOSPHORUS: 6.9 mg/dL — AB (ref 2.5–4.6)
Potassium: 4.1 mmol/L (ref 3.5–5.1)
SODIUM: 142 mmol/L (ref 135–145)

## 2014-12-14 LAB — PROTIME-INR
INR: 3.08 — AB (ref 0.00–1.49)
Prothrombin Time: 31.2 seconds — ABNORMAL HIGH (ref 11.6–15.2)

## 2014-12-14 MED ORDER — METOPROLOL SUCCINATE ER 50 MG PO TB24
75.0000 mg | ORAL_TABLET | Freq: Every day | ORAL | Status: DC
  Administered 2014-12-14 – 2014-12-22 (×9): 75 mg via ORAL
  Filled 2014-12-14 (×18): qty 1

## 2014-12-14 MED ORDER — FUROSEMIDE 40 MG PO TABS
120.0000 mg | ORAL_TABLET | Freq: Two times a day (BID) | ORAL | Status: DC
  Administered 2014-12-14 – 2014-12-18 (×9): 120 mg via ORAL
  Filled 2014-12-14: qty 3
  Filled 2014-12-14: qty 1
  Filled 2014-12-14: qty 3
  Filled 2014-12-14: qty 1
  Filled 2014-12-14: qty 6
  Filled 2014-12-14: qty 1
  Filled 2014-12-14: qty 3
  Filled 2014-12-14 (×2): qty 1
  Filled 2014-12-14: qty 3
  Filled 2014-12-14 (×5): qty 1

## 2014-12-14 MED ORDER — PHYTONADIONE 5 MG PO TABS
2.5000 mg | ORAL_TABLET | Freq: Once | ORAL | Status: AC
  Administered 2014-12-14: 2.5 mg via ORAL
  Filled 2014-12-14: qty 1

## 2014-12-14 NOTE — Progress Notes (Signed)
Patient Name: [name] Date of Encounter: 12/14/2014   SUBJECTIVE  Feels better. Sob stable. Denies chest pain. At times feels heart is racing with activity.   CURRENT MEDS . calcitRIOL  0.25 mcg Oral Daily  . diltiazem  180 mg Oral QHS  . ferrous sulfate  325 mg Oral Daily  . furosemide  80 mg Oral BID  . lipase/protease/amylase  24,000 Units Oral TID WC  . metoprolol succinate  50 mg Oral Daily  . sevelamer carbonate  800 mg Oral TID WC  . sodium bicarbonate  1,300 mg Oral BID  . sodium chloride  3 mL Intravenous Q12H    OBJECTIVE  Filed Vitals:   12/13/14 0500 12/13/14 1447 12/13/14 2104 12/14/14 0531  BP: 151/93 127/87 153/99 141/72  Pulse: 91 88 83 72  Temp: 98.1 F (36.7 C) 98 F (36.7 C) 97.9 F (36.6 C) 98.2 F (36.8 C)  TempSrc: Oral Oral Oral Oral  Resp: 20 20 18 19   Height:      Weight: 175 lb 3.2 oz (79.47 kg)   176 lb 3.2 oz (79.924 kg)  SpO2: 96% 95% 93% 96%    Intake/Output Summary (Last 24 hours) at 12/14/14 0826 Last data filed at 12/14/14 0634  Gross per 24 hour  Intake   1080 ml  Output   1100 ml  Net    -20 ml   Filed Weights   12/12/14 0631 12/13/14 0500 12/14/14 0531  Weight: 172 lb 14.4 oz (78.427 kg) 175 lb 3.2 oz (79.47 kg) 176 lb 3.2 oz (79.924 kg)    PHYSICAL EXAM  General: Pleasant, NAD. Neuro: Alert and oriented X 3. Moves all extremities spontaneously. Psych: Normal affect. HEENT:  Normal  Neck: Supple without bruits. + JVD. Lungs:  Resp regular and unlabored, CTA. Heart: Irregular no s3, s4. Systolic murmurs. Abdomen: Soft, non-tender, non-distended, BS + x 4.  Extremities: No clubbing, cyanosis. 1+ Le edema. DP/PT/Radials 2+ and equal bilaterally.  Accessory Clinical Findings  CBC No results for input(s): WBC, NEUTROABS, HGB, HCT, MCV, PLT in the last 72 hours. Basic Metabolic Panel  Recent Labs  12/12/14 0320 12/14/14 0314  NA 137 142  K 4.6 4.1  CL 102 102  CO2 24 26  GLUCOSE 104* 87  BUN 73* 74*    CREATININE 5.40* 5.05*  CALCIUM 9.0 9.4  PHOS 6.4* 6.9*   Liver Function Tests  Recent Labs  12/12/14 0320 12/14/14 0314  ALBUMIN 3.3* 3.2*   TELE  afib at rate of 100s.   Radiology/Studies  Dg Chest 2 View  12/10/2014   CLINICAL DATA:  Cough x3 months. SOB x8 months. No chest pain.Hx of HTN, CKD, PAF, Cardiomyopathy, aortic valve stenosis.  EXAM: CHEST  2 VIEW  COMPARISON:  10/13/2014  FINDINGS: Cardiopericardial silhouette is mildly enlarged. Aorta is mildly uncoiled. No mediastinal or hilar masses or evidence of adenopathy.  Mild interstitial prominence in the lower lungs. Minimal pleural effusions. No lung consolidation to suggest pneumonia. No pneumothorax.  Bony thorax is demineralized but grossly intact.  IMPRESSION: 1. Since prior study, mild interstitial thickening has developed in the lower lungs along with minimal pleural effusions. There is stable mild cardiomegaly. Findings suggest mild congestive heart failure. No convincing pneumonia.   Electronically Signed   By: Lajean Manes M.D.   On: 12/10/2014 19:14    ASSESSMENT AND PLAN  1. Acute on chronic diastolic heart failure - On lasix 80mg  PO BID.  - Dr. Burt Knack to see  today to discuss possible TAVR.  Warfarin on holdIn anticipation of cath, last dose 12/12/14.  Not on any anticoagulant of ASA.   2. CKD - Renal following.Likley need HD.  3. Severe aortic stenosis - As above.   4. Afib - Rate in 100s-110s. Will increase his Toprol XL to 75mg  for better rate control.   5. HPTH -On Calcitriol.    Signed, Leanor Kail PA-C Pager [phone]  See full consult note this same date.  [name] 12/14/2014 2:12 PM

## 2014-12-14 NOTE — Progress Notes (Signed)
UR completed 

## 2014-12-14 NOTE — Care Management Important Message (Signed)
Important Message  Patient Details  Name: [name] MRN: [id number] Date of Birth: 18-Jun-1940   Medicare Important Message Given:  Yes-second notification given    Pricilla Handler 12/14/2014, 3:09 PM

## 2014-12-14 NOTE — Progress Notes (Signed)
Assessment:  1. CKD 5. Renal fx stable 2. Sec HPTH on calcitriol 3. Severe AS  Plan: 1. High risk for worsening renal function with TAVR but not the determining factor, and risk not 100%.  I would not delay due to renal fct if medically necessary.  He has an access in his LUE if needed.    Subjective: Interval History: No change Objective: Vital signs in last 24 hours: Temp:  [97.9 F (36.6 C)-98.2 F (36.8 C)] 98.1 F (36.7 C) (08/15 0900) Pulse Rate:  [68-88] 68 (08/15 0900) Resp:  [18-20] 18 (08/15 0900) BP: (127-153)/(72-99) 147/83 mmHg (08/15 0900) SpO2:  [92 %-96 %] 92 % (08/15 0900) Weight:  [79.924 kg (176 lb 3.2 oz)] 79.924 kg (176 lb 3.2 oz) (08/15 0531) Weight change: 0.454 kg (1 lb)  Intake/Output from previous day: 08/14 0701 - 08/15 0700 In: 1080 [P.O.:1080] Out: 1100 [Urine:1100] Intake/Output this shift: Total I/O In: 820 [P.O.:820] Out: 1300 [Urine:1300]  General appearance: alert and cooperative Resp: clear to auscultation bilaterally Chest wall: no tenderness Cardio: regular rate and rhythm, S1, S2 normal, no murmur, click, rub or gallop and sytolic and dialstolic bruit radiating from LUE ; SEM radiating in aortic area to neck Extremities: edema 1+  Lab Results: No results for input(s): WBC, HGB, HCT, PLT in the last 72 hours. BMET:  Recent Labs  12/12/14 0320 12/14/14 0314  NA 137 142  K 4.6 4.1  CL 102 102  CO2 24 26  GLUCOSE 104* 87  BUN 73* 74*  CREATININE 5.40* 5.05*  CALCIUM 9.0 9.4    Recent Labs  12/12/14 0320  PTH 98*   Iron Studies: No results for input(s): IRON, TIBC, TRANSFERRIN, FERRITIN in the last 72 hours. Studies/Results: No results found.  Scheduled: . calcitRIOL  0.25 mcg Oral Daily  . diltiazem  180 mg Oral QHS  . ferrous sulfate  325 mg Oral Daily  . furosemide  120 mg Oral BID  . lipase/protease/amylase  24,000 Units Oral TID WC  . metoprolol succinate  75 mg Oral Daily  . sevelamer carbonate  800 mg  Oral TID WC  . sodium bicarbonate  1,300 mg Oral BID  . sodium chloride  3 mL Intravenous Q12H    LOS: 4 days   Bernell Sigal C 12/14/2014,1:51 PM   2. Cont PO lasix

## 2014-12-14 NOTE — Consult Note (Addendum)
[address]       [place] [address]             [phone]          CARDIOTHORACIC SURGERY CONSULTATION REPORT  PCP is [name], MD Referring Provider is [name], MD Primary Cardiologist is TURNER, Eber Hong, MD   Reason for consultation:  SEVERE AORTIC STENOSIS  HPI:  Patient is a 74 year old male with history of aortic stenosis,hypertension, stage V chronic kidney disease, chronic persistent atrial fibrillation, chronic diastolic congestive heart failure, and orthostatic hypotension referred for surgical consultation to discuss treatment options for management of severe symptomatic aortic stenosis. The patient's cardiac history dates back to childhood when he was first told that he had a heart murmur on physical exam. He was never formally evaluated by a cardiologist until a proximally 12 years ago when he first developed paroxysmal atrial fibrillation. He has been chronically anticoagulated using warfarin ever since, and he eventually developed persistent atrial fibrillation for which he underwent cardioversion in 2012.  He maintained sinus rhythm on oral amiodarone therapy for several years, but amiodarone was eventually reduced and stopped because of progressive shortness of breath. Transthoracic echocardiograms have previously documented the presence of moderate aortic stenosis with preserved left ventricular systolic function.  Echocardiogram performed 06/13/2014 demonstrated left ventricular ejection fraction estimated 55-60% with mean transvalvular gradient across the aortic valve estimated at 27 mmHg. Over the past 6 months patient has developed worsening problems with exertional shortness of breath, and he has been hospitalized now a total of 3 times with acute exacerbation of shortness of breath both with minimal activity and at rest. Follow-up echocardiogram performed 12/03/2014 demonstrated peak velocity across the aortic valve measured in  excess of 4 m/s corresponding to a mean transvalvular gradient of 44 mmHg and dimensionless velocity ratio of LVOT to aortic valve only 0.18.  Left ventricular ejection fraction remained preserved at 55%. There was severe mitral annular calcification and moderate mitral regurgitation.  The patient was seen in follow-up in the office by Dr. Radford Pax on 12/09/2014 and referred for elective surgical consultation. However, the patient presented acutely to the hospital several days ago with another acute exacerbation of resting shortness of breath mandating hospital admission. The patient was evaluated earlier today by Dr. Burt Knack and has been referred for surgical consultation.  The patient is married and lives locally with his wife in [place]. 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  The patient has remained functionally independent and reasonably active up until the last year. He enjoys playing golf and up until recently he has not had any significant physical limitations. He first began to experience worsening symptoms of exertional shortness of breath last summer. Over the past 6 months the patient has experienced debilitating symptoms of shortness of breath with minimal activity and occasionally at rest. He cannot lie flat in bed. He has had some lower extremity edema. He has occasional dizzy spells without syncope. He has never experienced any chest discomfort with exertion or at rest. He reports frequent palpitations.  The patient has stage V chronic kidney disease that has gradually progressed, originally having being attributed to problems related to obstructive uropathy caused by the patient's previous surgery for bladder cancer. The patient had left arm AV fistula placement performed last December, and the fistula has matured and is functioning normally. The patient has not yet required dialysis therapy, but his serum  creatinine remains chronically  greater than 5.  Past Medical History  Diagnosis Date  . Atrial fibrillation, persistent     a. cardioversion 11/21/10 . b. 05/2013 - experiencing more SOB. 24-hr holter showed avg HR 49, max 82, min 38bpm. ETT showed baseline junctional bradycardia, HR incr only to 90bpm with drop in BP. Saw EP - amiodarone reduced and eventually d/c'd with improvement in dyspnea.  . Hypertension   . Warfarin anticoagulation   . CKD (chronic kidney disease), stage V     a. Followed by Dr. Jimmy Footman. b. IV fistula placed 04/17/14.  [name] Aortic valve stenosis   . Orthostatic hypotension   . Diastolic dysfunction   . Arthritis   . Self-catheterizes urinary bladder   . Junctional bradycardia   . NSVT (nonsustained ventricular tachycardia)     a. By holter 01/2014.  [name] Shortness of breath dyspnea   . PVD (peripheral vascular disease)   . Cardiomyopathy   . RTA (renal tubular acidosis)   . Hypomagnesemia   . Bladder cancer     a. 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  [name] A-V fistula     Past Surgical History  Procedure Laterality Date  . Cardioversion  11/21/2010  . Cystectomy    . Cholecystectomy    . Cataract extraction w/ intraocular lens  implant, bilateral    . Colonoscopy    . Av fistula placement Left 04/17/2014    Procedure: ARTERIOVENOUS (AV) FISTULA CREATION- LEFT UPPER ARM ;  Surgeon: Mal [name], MD;  Location: [facility] OR;  Service: Vascular;  Laterality: Left;  . Bladder surgery      Family History  Problem Relation Age of Onset  . CVA Mother   . Heart attack Father   . Heart disease Father   . Arrhythmia Father   . Alzheimer's disease Sister   . Multiple sclerosis Brother   . Alcohol abuse Father     Social History   Social History  . Marital Status: Married    Spouse Name: N/A  . Number of Children: N/A  . Years of Education: N/A   Occupational History  . Not on file.   Social History Main Topics  . Smoking status: Former Smoker -- 2.00 packs/day  for 33 years    Types: Cigarettes    Quit date: 05/02/1983  . Smokeless tobacco: Never Used  . Alcohol Use: No  . Drug Use: No  . Sexual Activity: Not on file   Other Topics Concern  . Not on file   Social History Narrative    Prior to Admission medications   Medication Sig Start Date End Date Taking? Authorizing Provider  calcitRIOL (ROCALTROL) 0.25 MCG capsule Take 0.25 mcg by mouth daily.   Yes Historical Provider, MD  diltiazem (CARDIZEM CD) 180 MG 24 hr capsule Take 180 mg by mouth at bedtime.   Yes Historical Provider, MD  ferrous sulfate 325 (65 FE) MG tablet Take 325 mg by mouth daily.   Yes Historical Provider, MD  furosemide (LASIX) 40 MG tablet Take 40 mg by mouth 2 (two) times daily.   Yes Historical Provider, MD  Loperamide HCl (IMODIUM PO) Take 1 tablet by mouth 2 (two) times daily.   Yes Historical Provider, MD  metoprolol succinate (TOPROL-XL) 100 MG 24 hr tablet Take 50 mg by mouth daily.   Yes Historical Provider, MD  Multiple Vitamins-Minerals (CENTRUM SILVER ULTRA MENS PO) Take 1 tablet by mouth daily. Take daily   Yes Historical  Provider, MD  Pancrelipase, Lip-Prot-Amyl, (CREON) 24000 UNITS CPEP Take one (1) capsule by mouth with each meal   Yes Historical Provider, MD  PRESCRIPTION MEDICATION Take 1 tablet by mouth daily. (4-STRAIN PROBIOTIC)   Yes Historical Provider, MD  sevelamer carbonate (RENVELA) 800 MG tablet Take one (1) tablet by mouth with each meal   Yes Historical Provider, MD  sodium bicarbonate 650 MG tablet Take 2 tablets (1,300 mg total) by mouth 2 (two) times daily. 06/12/14  Yes Thurnell Lose, MD  warfarin (COUMADIN) 5 MG tablet Take 0.5 tablets (2.5 mg total) by mouth daily. Take as directed by coumadin clinic Patient taking differently: Take 2.5-5 mg by mouth daily. 2.5 mg daily except 5 mg on Thursday 05/22/14  Yes Kinnie Feil, MD  furosemide (LASIX) 80 MG tablet Take 1 tablet (80 mg total) by mouth 2 (two) times daily. Patient not  taking: Reported on 12/10/2014 12/09/14   Sueanne Margarita, MD    Current Facility-Administered Medications  Medication Dose Route Frequency Provider Last Rate Last Dose  . 0.9 %  sodium chloride infusion  250 mL Intravenous PRN Arnoldo Lenis, MD      . acetaminophen (TYLENOL) tablet 650 mg  650 mg Oral Q4H PRN Arnoldo Lenis, MD      . calcitRIOL (ROCALTROL) capsule 0.25 mcg  0.25 mcg Oral Daily Arnoldo Lenis, MD   0.25 mcg at 12/14/14 1039  . diltiazem (CARDIZEM CD) 24 hr capsule 180 mg  180 mg Oral QHS Arnoldo Lenis, MD   180 mg at 12/13/14 2120  . ferrous sulfate tablet 325 mg  325 mg Oral Daily Arnoldo Lenis, MD   325 mg at 12/14/14 0941  . furosemide (LASIX) tablet 120 mg  120 mg Oral BID Estanislado Emms, MD   120 mg at 12/14/14 1708  . guaiFENesin-dextromethorphan (ROBITUSSIN DM) 100-10 MG/5ML syrup 5 mL  5 mL Oral Q4H PRN Arnoldo Lenis, MD   5 mL at 12/13/14 1010  . lipase/protease/amylase (CREON) capsule 24,000 Units  24,000 Units Oral TID WC Arnoldo Lenis, MD   24,000 Units at 12/14/14 1708  . loperamide (IMODIUM) capsule 2 mg  2 mg Oral PRN Arnoldo Lenis, MD   2 mg at 12/14/14 1039  . metoprolol succinate (TOPROL-XL) 24 hr tablet 75 mg  75 mg Oral Daily Bhavinkumar Bhagat, PA   75 mg at 12/14/14 0941  . ondansetron (ZOFRAN) injection 4 mg  4 mg Intravenous Q6H PRN Arnoldo Lenis, MD      . sevelamer carbonate (RENVELA) tablet 800 mg  800 mg Oral TID WC Arnoldo Lenis, MD   800 mg at 12/14/14 1708  . sodium bicarbonate tablet 1,300 mg  1,300 mg Oral BID Arnoldo Lenis, MD   1,300 mg at 12/14/14 0941  . sodium chloride 0.9 % injection 3 mL  3 mL Intravenous Q12H Arnoldo Lenis, MD   3 mL at 12/14/14 0941  . sodium chloride 0.9 % injection 3 mL  3 mL Intravenous PRN Arnoldo Lenis, MD        Allergies  Allergen Reactions  . Amiodarone Other (See Comments)    Severely reduced DLCO  . Oxycodone Hcl Rash and Other (See Comments)    Rash and  itching      Review of Systems:   General:  decreased appetite, decreased energy, + weight gain, no weight loss, no fever  Cardiac:  no chest pain with exertion,  no chest pain at rest, + SOB with exertion, + occasional resting SOB, no PND, + orthopnea, + palpitations, + arrhythmia, + atrial fibrillation, + LE edema, + dizzy spells, NO syncope  Respiratory:  + shortness of breath, no home oxygen, no productive cough, + chronic dry cough, no bronchitis, no wheezing, no hemoptysis, no asthma, no pain with inspiration or cough, no sleep apnea, no CPAP at night  GI:   some difficulty swallowing, no reflux, no frequent heartburn, no hiatal hernia, no abdominal pain, no constipation, + chronic diarrhea, no hematochezia, no hematemesis, no melena  GU:   Self-catheterizes for urine s/p radical cystectomy, no dysuria,  + frequency, no urinary tract infection, no hematuria, *no enlarged prostate, no kidney stones, + kidney disease  Vascular:  no pain suggestive of claudication, no pain in feet, no leg cramps, no varicose veins, no DVT, + non-healing foot ulcer, right lower leg  Neuro:   no stroke, no TIA's, no seizures, no headaches, no temporary blindness one eye,  no slurred speech, no peripheral neuropathy, no chronic pain, no instability of gait, no memory/cognitive dysfunction  Musculoskeletal: mild arthritis - primarily involving the hands, no joint swelling, no myalgias, no difficulty walking, normal mobility   Skin:   no rash, no itching, no skin infections, + pressure sores or ulcerations, + chronic skin changes from warfarin  Psych:   no anxiety, no depression, no nervousness, no unusual recent stress  Eyes:   + blurry vision, no floaters, + recent vision changes, + wears glasses or contacts  ENT:   no hearing loss, edentulous w/ full set dentures, last saw dentist several years ago  Hematologic:  + easy bruising, + abnormal bleeding, no clotting disorder, no frequent epistaxis, no complications w/  long term warfarin  Endocrine:  no diabetes, does not check CBG's at home     Physical Exam:   BP 147/79 mmHg  Pulse 69  Temp(Src) 97.8 F (36.6 C) (Oral)  Resp 18  Ht 5\' 8"  (1.727 m)  Wt 79.924 kg (176 lb 3.2 oz)  BMI 26.80 kg/m2  SpO2 95%  General:  elderly-appearing  HEENT:  Unremarkable   Neck:   no JVD, no bruits, no adenopathy   Chest:   clear to auscultation, symmetrical breath sounds, no wheezes, no rhonchi   CV:   RRR, grade III/VI harsh systolic murmur heard all across precordium  Abdomen:  soft, non-tender, no masses   Extremities:  warm, well-perfused, pulses diminished, + lower extremity edema, + ulceration right lower leg  Rectal/GU  Deferred  Neuro:   Grossly non-focal and symmetrical throughout  Skin:   Clean and dry, no rashes, no breakdown  Diagnostic Tests:  Echocardiography  Patient:  Ivis, Henneman MR #:    [id number] Study Date: 12/03/2014 Gender:   M Age:    66 Height:   172.7 cm Weight:   78.5 kg BSA:    1.95 m^2 Pt. Status: Room:  ATTENDING  Fransico Him, MD ORDERING   Fransico Him, MD REFERRING  Fransico Him, MD SONOGRAPHER Charlann Noss, RDCS PERFORMING  [facility], Outpatient  cc:  ------------------------------------------------------------------- LV EF: 55%  ------------------------------------------------------------------- Indications:   Cardiomyopathy.  ------------------------------------------------------------------- History:  PMH: Acquired from the patient and from the patient&'s chart. Atrial fibrillation. Cardiomyopathy. Aortic valve disease. Chronic kidney disease. Risk factors: Hypertension.  ------------------------------------------------------------------- Study Conclusions  - Left ventricle: There was moderate concentric hypertrophy. The estimated ejection fraction was 55%. - Aortic valve: There was severe stenosis. There was mild regurgitation. -  Mitral valve:  Severely calcified annulus. There was mild to moderate regurgitation. Valve area by continuity equation (using LVOT flow): 1.25 cm^2. - Left atrium: The atrium was severely dilated. - Right atrium: The atrium was mildly dilated. - Atrial septum: No defect or patent foramen ovale was identified.  ------------------------------------------------------------------- Labs, prior tests, procedures, and surgery: Echocardiography (February 2016).  Echocardiography. M-mode, complete 2D, spectral Doppler, and color Doppler. Birthdate: Patient birthdate: 1940/08/05. Age: Patient is 74 yr old. Sex: Gender: male.  BMI: 26.3 kg/m^2. Blood pressure:   145/86 Patient status: Outpatient. Study date: Study date: 12/03/2014. Study time: 09:54 AM. Location: [facility]  -------------------------------------------------------------------  ------------------------------------------------------------------- Left ventricle: There was moderate concentric hypertrophy. The estimated ejection fraction was 55%.  ------------------------------------------------------------------- Aortic valve:  Moderately calcified leaflets. Doppler:  There was severe stenosis.  There was mild regurgitation.  VTI ratio of LVOT to aortic valve: 0.19. Valve area (VTI): 0.47 cm^2. Indexed valve area (VTI): 0.24 cm^2/m^2. Mean velocity ratio of LVOT to aortic valve: 0.18. Valve area (Vmean): 0.45 cm^2. Indexed valve area (Vmean): 0.23 cm^2/m^2.  Mean gradient (S): 44 mm Hg. Peak gradient (S): 87 mm Hg.  ------------------------------------------------------------------- Aorta: Ascending aorta: The ascending aorta was mildly dilated.  ------------------------------------------------------------------- Mitral valve:  Severely calcified annulus. Doppler: There was mild to moderate regurgitation.  Valve area by continuity equation (using LVOT flow): 1.25 cm^2. Indexed valve area  by continuity equation (using LVOT flow): 0.64 cm^2/m^2.  Mean gradient (D): 4 mm Hg.  ------------------------------------------------------------------- Left atrium: The atrium was severely dilated.  ------------------------------------------------------------------- Atrial septum: No defect or patent foramen ovale was identified.  ------------------------------------------------------------------- Right ventricle: The cavity size was normal. Wall thickness was normal. Systolic function was normal.  ------------------------------------------------------------------- Pulmonic valve:  Doppler: There was mild regurgitation.  ------------------------------------------------------------------- Tricuspid valve:  Doppler: There was mild regurgitation.  ------------------------------------------------------------------- Right atrium: The atrium was mildly dilated.  ------------------------------------------------------------------- Pericardium: The pericardium was normal in appearance.  ------------------------------------------------------------------- Measurements  Left ventricle             Value       Reference LV ID, ED, PLAX chordal        51  mm     43 - 52 LV ID, ES, PLAX chordal        32  mm     23 - 38 LV fx shortening, PLAX chordal     37  %      >=29 LV PW thickness, ED          14  mm     --------- IVS/LV PW ratio, ED          0.93       <=1.3 Stroke volume, 2D           51  ml     --------- Stroke volume/bsa, 2D         26  ml/m^2   ---------  Ventricular septum           Value       Reference IVS thickness, ED           13  mm     ---------  LVOT                  Value       Reference LVOT ID, S               18  mm     --------- LVOT  area  2.54 cm^2    --------- LVOT ID                18  mm     --------- LVOT mean velocity, S         52.4 cm/s    --------- LVOT VTI, S              20.2 cm     --------- Stroke volume (SV), LVOT DP      51.4 ml     --------- Cardiac output (Qs), LVOT DP      3.9  L/min    --------- Cardiac index (Qs/bsa), LVOT      2   L/(min-m^2) --------- DP Stroke index (SV/bsa), LVOT DP     26.3 ml/m^2   ---------  Aortic valve              Value       Reference Aortic valve peak velocity, S     466  cm/s    --------- Aortic valve mean velocity, S     298  cm/s    --------- Aortic valve VTI, S          109  cm     --------- Aortic mean gradient, S        44  mm Hg    --------- Aortic peak gradient, S        87  mm Hg    --------- VTI ratio, LVOT/AV           0.19       --------- Aortic valve area, VTI         0.47 cm^2    --------- Aortic valve area/bsa, VTI       0.24 cm^2/m^2  --------- Velocity ratio, mean, LVOT/AV     0.18       --------- Aortic valve area, mean        0.45 cm^2    --------- velocity Aortic valve area/bsa, mean      0.23 cm^2/m^2  --------- velocity Aortic regurg pressure         489  ms     --------- half-time  Aorta                 Value       Reference Aortic root ID, ED           39  mm     ---------  Left atrium              Value       Reference LA ID, A-P, ES             56  mm     --------- LA ID/bsa, A-P         (H)   2.87 cm/m^2   <=2.2 LA volume, S              238  ml     --------- LA volume/bsa, S            121.8  ml/m^2   --------- LA volume, ES, 1-p A4C         189  ml     --------- LA volume/bsa, ES, 1-p A4C       96.7 ml/m^2   --------- LA volume, ES, 1-p A2C         282  ml     --------- LA volume/bsa, ES, 1-p A2C       144.3  ml/m^2   ---------  Mitral valve              Value       Reference Mitral mean velocity, D        91.2 cm/s    --------- Mitral mean gradient, D        4   mm Hg    --------- Mitral valve area, LVOT        1.25 cm^2    --------- continuity Mitral valve area/bsa, LVOT      0.64 cm^2/m^2  --------- continuity Mitral annulus VTI, D         41.1 cm     ---------  Systemic veins             Value       Reference Estimated CVP             3   mm Hg    ---------  Right ventricle            Value       Reference RV s&', lateral, S           8.38 cm/s    ---------  Legend: (L) and (H) mark values outside specified reference range.  ------------------------------------------------------------------- Prepared and Electronically Authenticated by  Jenkins Rouge, M.D. 2016-08-04T11:00:22      STS Risk Calculator  Procedure    AVR  Risk of Mortality   15.3% Morbidity or Mortality  64.4% Prolonged LOS   31.3% Short LOS    9.2% Permanent Stroke   5.6% Prolonged Vent Support  43.2% DSW Infection    0.3% Renal Failure    42.5% Reoperation    22.6%    Impression:  Patient has stage D severe symptomatic aortic stenosis. I have personally reviewed the patient's most recent transthoracic echocardiogram.  There is severe calcification with thickening and restricted leaflet mobility involving all 3 leaflets of the aortic valve.  Peak velocity across the aortic valve measured greater than 4 m/s corresponding to mean transvalvular gradient estimated 44  mmHg. There is at least mild to moderate aortic insufficiency. There also appears to be bulky calcification involving the mitral valve including both the anterior and posterior annulus. This calcification appears to extend into the left ventricular outflow tract and to the aortic valve annulus through the intervalvular fibrosa.  There is at least moderate mitral regurgitation.  Left ventricular systolic function remains reasonably well preserved with ejection fraction estimated 55%. Risks associated with conventional surgical aortic valve replacement would unquestionably be very high, and it is possible that transcatheter aortic valve replacement might prove to be a less invasive and potentially less risky alternative. However, the presence of significant calcification in the aortic root and left ventricular outflow tract might increase the risk of paravalvular leak.    Plan:  The patient and his wife have been counseled at length regarding treatment alternatives for management of severe symptomatic aortic stenosis. Alternative approaches such as conventional aortic valve replacement, transcatheter aortic valve replacement, and palliative medical therapy were compared and contrasted at length.  The risks associated with conventional surgical aortic valve replacement were been discussed in detail, as were expectations for post-operative convalescence. Long-term prognosis with medical therapy was discussed. This discussion was placed in the context of the patient's own specific clinical presentation and past medical history.  Implications regarding the patient's underlying chronic kidney disease has been discussed including the high likelihood that the patient may need dialysis therapy initiated in the very  near future.  All of their questions have been addressed.  I agree with plans outlined by Dr. Burt Knack to proceed with left and right heart catheterization as a next step.  Depending upon findings at  catheterization we can discuss whether or not to proceed with CT angiography to further characterize the anatomical feasibility of transcatheter aortic valve replacement as an alternative to high risk conventional surgery. Transesophageal echocardiogram might prove useful to further evaluate the functional anatomy and severity of the patient's mitral regurgitation and the presence of significant calcification throughout the entire mitral annulus and left ventricular outflow tract.  I also agree with plans to proceed with lower extremity duplex examination. We will continue to follow along while the patient remains in the hospital and assist with decision making regarding further diagnostic tests.   I spent in excess of 120 minutes during the conduct of this hospital consultation and >50% of this time involved direct face-to-face encounter for counseling and/or coordination of the patient's care.    Valentina Gu. Roxy Manns, MD 12/14/2014 6:59 PM

## 2014-12-14 NOTE — Consult Note (Signed)
CARDIOLOGY CONSULT NOTE  Patient ID: [name], MRN: [id number], DOB/AGE: [birth year] 75 y.o. Admit date: 12/10/2014 Date of Consult: 12/14/2014  Primary Physician: [name], MD Primary Cardiologist: Dr Radford Pax Referring Physician: Dr Percival Spanish  Chief Complaint: shortness of breath Reason for Consultation: severe aortic stenosis  HPI: 74 yo male hospitalized 8/11 with progressive shortness of breath and leg swelling. He describes a 4 week history of progressive and marked shortness of breath with any level of activity. He has also developed orthopnea, PND, and leg swelling. He has noted moderate limitation because of shortness of breath and generalized fatigue over approximately one year. However, symptoms have been much worse over the last month. He has been noted to have moderate aortic stenosis over recent years, but on his most recent echocardiogram his aortic stenosis had progressed into the severe range with a mean gradient of 44 mmHg and a dimensionless index less than 0.25. He was referred to Dr Cyndia Bent for outpatient evaluation, but required hospitalization before he could be seen in the office. The patient has been hospitalized with diastolic heart failure on 3 separate occasions in the last year in December 2015, January 2016, and again in February 2016. He states at this point he is unable to do any physical activity because of breathlessness. He previously had been able to play golf and remain relatively active. He denies any other significant physical limitation.  The patient has had progressive kidney disease. He is followed by Dr. Jimmy Footman. The patient has undergone AV fistula placement in the left arm and his fistula is functioning normally. He has not required hemodialysis today, but his creatinine is greater than 5 mg/dL. He has no past history of coronary artery disease, exertional angina, lightheadedness, or syncope.  The patient lives locally with his wife. 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  Medical History:  Past Medical History  Diagnosis Date  . Paroxysmal atrial fibrillation     a. cardioversion 11/21/10 - Dr Radford Pax. b. 05/2013 - experiencing more SOB. 24-hr holter showed avg HR 49, max 82, min 38bpm. ETT showed baseline junctional bradycardia, HR incr only to 90bpm with drop in BP. Saw EP - amiodarone reduced and eventually d/c'd with improvement in dyspnea.  . Hypertension   . Warfarin anticoagulation   . CKD (chronic kidney disease), stage V     a. Followed by Dr. Jimmy Footman. b. IV fistula placed 04/17/14.  [name] Aortic valve stenosis     moderate by echo 06/2014  . Orthostatic hypotension   . Diastolic dysfunction   . Arthritis   . Self-catheterizes urinary bladder   . Junctional bradycardia   . NSVT (nonsustained ventricular tachycardia)     a. By holter 01/2014.  [name] Shortness of breath dyspnea   . PVD (peripheral vascular disease)   . Cardiomyopathy   . RTA (renal tubular acidosis)   . Hypomagnesemia   . Bladder cancer     a. 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  [name] A-V fistula       Surgical History:  Past Surgical History  Procedure Laterality Date  . Cardioversion  11/21/2010  . Cystectomy    . Cholecystectomy    . Cataract extraction w/ intraocular lens  implant, bilateral    . Colonoscopy    . Av fistula placement Left 04/17/2014    Procedure: ARTERIOVENOUS (AV) FISTULA CREATION- LEFT  UPPER ARM ;  Surgeon: Mal [name], MD;  Location: [facility];  Service: Vascular;  Laterality: Left;  . Bladder surgery       Home Meds: Prior to Admission medications   Medication Sig Start Date End Date Taking? Authorizing Provider  calcitRIOL (ROCALTROL) 0.25 MCG capsule Take 0.25 mcg by mouth daily.   Yes Historical Provider, MD  diltiazem (CARDIZEM CD) 180 MG 24 hr capsule Take 180 mg by mouth at bedtime.   Yes Historical  Provider, MD  ferrous sulfate 325 (65 FE) MG tablet Take 325 mg by mouth daily.   Yes Historical Provider, MD  furosemide (LASIX) 40 MG tablet Take 40 mg by mouth 2 (two) times daily.   Yes Historical Provider, MD  Loperamide HCl (IMODIUM PO) Take 1 tablet by mouth 2 (two) times daily.   Yes Historical Provider, MD  metoprolol succinate (TOPROL-XL) 100 MG 24 hr tablet Take 50 mg by mouth daily.   Yes Historical Provider, MD  Multiple Vitamins-Minerals (CENTRUM SILVER ULTRA MENS PO) Take 1 tablet by mouth daily. Take daily   Yes Historical Provider, MD  Pancrelipase, Lip-Prot-Amyl, (CREON) 24000 UNITS CPEP Take one (1) capsule by mouth with each meal   Yes Historical Provider, MD  PRESCRIPTION MEDICATION Take 1 tablet by mouth daily. (4-STRAIN PROBIOTIC)   Yes Historical Provider, MD  sevelamer carbonate (RENVELA) 800 MG tablet Take one (1) tablet by mouth with each meal   Yes Historical Provider, MD  sodium bicarbonate 650 MG tablet Take 2 tablets (1,300 mg total) by mouth 2 (two) times daily. 06/12/14  Yes Thurnell Lose, MD  warfarin (COUMADIN) 5 MG tablet Take 0.5 tablets (2.5 mg total) by mouth daily. Take as directed by coumadin clinic Patient taking differently: Take 2.5-5 mg by mouth daily. 2.5 mg daily except 5 mg on Thursday 05/22/14  Yes Kinnie Feil, MD  furosemide (LASIX) 80 MG tablet Take 1 tablet (80 mg total) by mouth 2 (two) times daily. Patient not taking: Reported on 12/10/2014 12/09/14   Sueanne Margarita, MD    Inpatient Medications:  . calcitRIOL  0.25 mcg Oral Daily  . diltiazem  180 mg Oral QHS  . ferrous sulfate  325 mg Oral Daily  . furosemide  120 mg Oral BID  . lipase/protease/amylase  24,000 Units Oral TID WC  . metoprolol succinate  75 mg Oral Daily  . sevelamer carbonate  800 mg Oral TID WC  . sodium bicarbonate  1,300 mg Oral BID  . sodium chloride  3 mL Intravenous Q12H      Allergies:  Allergies  Allergen Reactions  . Amiodarone Other (See Comments)     Severely reduced DLCO  . Oxycodone Hcl Rash and Other (See Comments)    Rash and itching    Social History   Social History  . Marital Status: Married    Spouse Name: N/A  . Number of Children: N/A  . Years of Education: N/A   Occupational History  . Not on file.   Social History Main Topics  . Smoking status: Former Smoker -- 2.00 packs/day for 33 years    Types: Cigarettes    Quit date: 05/02/1983  . Smokeless tobacco: Never Used  . Alcohol Use: No  . Drug Use: No  . Sexual Activity: Not on file   Other Topics Concern  . Not on file   Social History Narrative     Family History  Problem Relation Age of Onset  . CVA Mother   .  Heart attack Father   . Heart disease Father   . Arrhythmia Father   . Alzheimer's disease Sister   . Multiple sclerosis Brother   . Alcohol abuse Father      Review of Systems: General: negative for chills, fever, night sweats or weight changes.  ENT: negative for rhinorrhea or epistaxis Cardiovascular: negative for chest pain, palpitations Dermatological: positive for ulceration right lower leg Respiratory: positive for cough and shortness of breath GI: negative for nausea, vomiting, diarrhea, bright red blood per rectum, melena, or hematemesis GU: no hematuria, urgency, or frequency Neurologic: negative for visual changes, syncope, headache, or dizziness Heme: no easy bruising or bleeding Endo: negative for excessive thirst, thyroid disorder, or flushing Musculoskeletal: negative for joint pain or swelling, negative for myalgias  All other systems reviewed and are otherwise negative except as noted above.  Physical Exam: Blood pressure 147/83, pulse 68, temperature 98.1 F (36.7 C), temperature source Oral, resp. rate 18, height 5\' 8"  (1.727 m), weight 176 lb 3.2 oz (79.924 kg), SpO2 92 %. Pt is alert and oriented, pleasant elderly male in NAD, WD, WN HEENT: normal Neck: JVP normal. Carotid upstrokes normal with bilateral  bruits. No thyromegaly. Lungs: equal expansion, clear bilaterally CV: Apex is discrete and nondisplaced, irregularly irregular with grade 3/6 harsh systolic murmur at the RUSB/LLSB, soft diastolic decrescendo murmur present Abd: soft, NT, +BS, no bruit, no hepatosplenomegaly Back: no CVA tenderness Ext: 2+ pretibial edema present bilaterally        DP/PT pulses diminished bilaterally Skin: warm and dry. There is a well-demarcated ulceration above the right medial malleolus without surrounding erythema or tenderness Neuro: CNII-XII intact             Strength intact = bilaterally    Labs: No results for input(s): CKTOTAL, CKMB, TROPONINI in the last 72 hours. Lab Results  Component Value Date   WBC 8.0 12/11/2014   HGB 11.6* 12/11/2014   HCT 35.6* 12/11/2014   MCV 106.0* 12/11/2014   PLT 213 12/11/2014    Recent Labs Lab 12/14/14 0314  NA 142  K 4.1  CL 102  CO2 26  BUN 74*  CREATININE 5.05*  CALCIUM 9.4  GLUCOSE 87   No results found for: CHOL, HDL, LDLCALC, TRIG No results found for: DDIMER  Radiology/Studies:  Dg Chest 2 View  12/10/2014   CLINICAL DATA:  Cough x3 months. SOB x8 months. No chest pain.Hx of HTN, CKD, PAF, Cardiomyopathy, aortic valve stenosis.  EXAM: CHEST  2 VIEW  COMPARISON:  10/13/2014  FINDINGS: Cardiopericardial silhouette is mildly enlarged. Aorta is mildly uncoiled. No mediastinal or hilar masses or evidence of adenopathy.  Mild interstitial prominence in the lower lungs. Minimal pleural effusions. No lung consolidation to suggest pneumonia. No pneumothorax.  Bony thorax is demineralized but grossly intact.  IMPRESSION: 1. Since prior study, mild interstitial thickening has developed in the lower lungs along with minimal pleural effusions. There is stable mild cardiomegaly. Findings suggest mild congestive heart failure. No convincing pneumonia.   Electronically Signed   By: Lajean Manes M.D.   On: 12/10/2014 19:14   Cardiac Studies: 2D Echo: Study  Conclusions  - Left ventricle: There was moderate concentric hypertrophy. The estimated ejection fraction was 55%. - Aortic valve: There was severe stenosis. There was mild regurgitation. - Mitral valve: Severely calcified annulus. There was mild to moderate regurgitation. Valve area by continuity equation (using LVOT flow): 1.25 cm^2. - Left atrium: The atrium was severely dilated. - Right atrium: The  atrium was mildly dilated. - Atrial septum: No defect or patent foramen ovale was identified.  ------------------------------------------------------------------- Labs, prior tests, procedures, and surgery: Echocardiography (February 2016).  Echocardiography. M-mode, complete 2D, spectral Doppler, and color Doppler. Birthdate: Patient birthdate: January 12, 1941. Age: Patient is 74 yr old. Sex: Gender: male.  BMI: 26.3 kg/m^2. Blood pressure:   145/86 Patient status: Outpatient. Study date: Study date: 12/03/2014. Study time: 09:54 AM. Location: [facility]  -------------------------------------------------------------------  ------------------------------------------------------------------- Left ventricle: There was moderate concentric hypertrophy. The estimated ejection fraction was 55%.  ------------------------------------------------------------------- Aortic valve:  Moderately calcified leaflets. Doppler:  There was severe stenosis.  There was mild regurgitation.  VTI ratio of LVOT to aortic valve: 0.19. Valve area (VTI): 0.47 cm^2. Indexed valve area (VTI): 0.24 cm^2/m^2. Mean velocity ratio of LVOT to aortic valve: 0.18. Valve area (Vmean): 0.45 cm^2. Indexed valve area (Vmean): 0.23 cm^2/m^2.  Mean gradient (S): 44 mm Hg. Peak gradient (S): 87 mm Hg.  ------------------------------------------------------------------- Aorta: Ascending aorta: The ascending aorta was mildly  dilated.  ------------------------------------------------------------------- Mitral valve:  Severely calcified annulus. Doppler: There was mild to moderate regurgitation.  Valve area by continuity equation (using LVOT flow): 1.25 cm^2. Indexed valve area by continuity equation (using LVOT flow): 0.64 cm^2/m^2.  Mean gradient (D): 4 mm Hg.  ------------------------------------------------------------------- Left atrium: The atrium was severely dilated.  ------------------------------------------------------------------- Atrial septum: No defect or patent foramen ovale was identified.  ------------------------------------------------------------------- Right ventricle: The cavity size was normal. Wall thickness was normal. Systolic function was normal.  ------------------------------------------------------------------- Pulmonic valve:  Doppler: There was mild regurgitation.  ------------------------------------------------------------------- Tricuspid valve:  Doppler: There was mild regurgitation.  ------------------------------------------------------------------- Right atrium: The atrium was mildly dilated.  ------------------------------------------------------------------- Pericardium: The pericardium was normal in appearance.  High-Resolution Chest CT 06/11/2014: IMPRESSION: 1. Moderate centrilobular emphysema. No other evidence of interstitial lung disease. 2. Pulmonary nodules up to 7 mm. Per consensus criteria, this warrants followup at 3-6 months to confirm stability. This recommendation follows the consensus statement: Guidelines for Management of Small Pulmonary Nodules Detected on CT Scans: A Statement from the Fleischner Society as published in Radiology 2005; 237:395-400. 3. Cardiomegaly with advanced atherosclerosis, including within the coronary arteries. Small bilateral pleural effusions which could represent a component of fluid  overload. 4. Mild mediastinal adenopathy. Likely reactive and/or related to congestive heart failure. Recommend attention on follow-up. 5. Pulmonary artery enlargement suggests pulmonary arterial Hypertension.  STS RISK CALCULATOR Procedure: AV Replacement  Risk of Mortality: 15.209%  Morbidity or Mortality: 65.401%  Long Length of Stay: 35.34%  Short Length of Stay: 7.895%  Permanent Stroke: 3.455%  Prolonged Ventilation: 49.061%  DSW Infection: 0.314%  Renal Failure: 47.188%  Reoperation: 21.366%   ASSESSMENT AND PLAN:  74 yo gentleman with multiple medical problems including Stage 5 CKD presents with acute on chronic diastolic heart failure, NYHA 4, in the context of progressive and now severe, stage D, aortic stenosis. I have personally reviewed his echocardiogram and agree that he clearly has severe restriction of all 3 aortic valve leaflets with doppler criteria definitively demonstrating severe aortic stenosis. In addition, he has at least moderate mitral regurgitation which I suspect is functional.  I have reviewed the natural history of aortic stenosis with the patient and his wife who is at the bedside today. We have discussed the limitations of medical therapy and the poor prognosis associated with symptomatic aortic stenosis. We have also reviewed potential treatment options, including palliative medical therapy, conventional surgical aortic valve replacement, and transcatheter aortic valve replacement. We discussed treatment  options in the context of this patient's specific comorbid medical conditions. The patient clearly understands the implications of his kidney disease and the risk of progressive renal failure that will almost certainly accompany workup and treatment of aortic stenosis. The renal team is following him closely as an outpatient and during his current hospitalization. He continues to have evidence of volume overload on physical exam.   I have recommended right and  left heart catheterization as the next step in his evaluation. The presence or absence of CAD may help determine whether TAVR or conventional surgical AVR is the best treatment for this patient's aortic stenosis. I have reviewed the risks, indications, and alternatives to cardiac catheterization with the patient. Risks include but are not limited to bleeding, infection, vascular injury, stroke, myocardial infection, arrhythmia, kidney injury, radiation-related injury in the case of prolonged fluoroscopy use, emergency cardiac surgery, and death. The patient understands the risks of serious complication is low (<0%). His INR is > 3 today and he will be given oral Vit K 2.5 mg in order to expedite his evaluation. I will also ask for a formal cardiac surgical evaluation by Dr Roxy Manns to help determine best treatment in this patient. Pending results of his heart cath and surgical evaluation, will continue workup with appropriate CT scans for procedural planning if he is found to be a TAVR candidate.   As the patient has a lower extremity ulcer and abnormal pulse exam, will also check ABI's and arterial doppler studies. Will continue to follow with you - thanks.  Deatra James MD, [facility] 12/14/2014, 12:08 PM

## 2014-12-15 ENCOUNTER — Other Ambulatory Visit: Payer: Self-pay | Admitting: *Deleted

## 2014-12-15 DIAGNOSIS — I5033 Acute on chronic diastolic (congestive) heart failure: Principal | ICD-10-CM

## 2014-12-15 DIAGNOSIS — I350 Nonrheumatic aortic (valve) stenosis: Secondary | ICD-10-CM | POA: Diagnosis not present

## 2014-12-15 DIAGNOSIS — R600 Localized edema: Secondary | ICD-10-CM

## 2014-12-15 LAB — BASIC METABOLIC PANEL
Anion gap: 13 (ref 5–15)
BUN: 71 mg/dL — AB (ref 6–20)
CHLORIDE: 102 mmol/L (ref 101–111)
CO2: 23 mmol/L (ref 22–32)
Calcium: 9.3 mg/dL (ref 8.9–10.3)
Creatinine, Ser: 4.76 mg/dL — ABNORMAL HIGH (ref 0.61–1.24)
GFR calc Af Amer: 13 mL/min — ABNORMAL LOW (ref >60)
GFR calc non Af Amer: 11 mL/min — ABNORMAL LOW (ref >60)
GLUCOSE: 147 mg/dL — AB (ref 65–99)
Potassium: 4.1 mmol/L (ref 3.5–5.1)
SODIUM: 138 mmol/L (ref 135–145)

## 2014-12-15 LAB — PROTIME-INR
INR: 2.08 — AB (ref 0.00–1.49)
Prothrombin Time: 23.2 seconds — ABNORMAL HIGH (ref 11.6–15.2)

## 2014-12-15 NOTE — Progress Notes (Signed)
Subjective: Interval History: His brother is present at bedside. Denies any complaints. He acknowledges understanding and dialysis will likely be deferred until after his cardiac issues are addressed.  Objective: Vital signs in last 24 hours: Temp:  [97.6 F (36.4 C)-98.1 F (36.7 C)] 97.9 F (36.6 C) (08/16 1349) Pulse Rate:  [72-101] 101 (08/16 1349) Resp:  [18-20] 20 (08/16 1349) BP: (141-157)/(80-97) 151/88 mmHg (08/16 1349) SpO2:  [93 %-97 %] 93 % (08/16 1349) Weight:  [174 lb 8 oz (79.153 kg)] 174 lb 8 oz (79.153 kg) (08/16 0700) Weight change: -1 lb 11.2 oz (-0.771 kg)  Intake/Output from previous day: 08/15 0701 - 08/16 0700 In: 1780 [P.O.:1780] Out: 2925 [Urine:2925] Intake/Output this shift: Total I/O In: 243 [P.O.:240; I.V.:3] Out: 1100 [Urine:1100]  General: resting in bed, NAD HEENT: PERRL, EOMI, no scleral icterus, oropharynx clear Cardiac: Harsh systolic murmur heard all throughout Pulm: clear to auscultation bilaterally, no wheezes, rales, or rhonchi Ext: warm and well perfused, 2+ pitting edema bilaterally, left AV fistula with palpable thrill Neuro: responds to questions appropriately; moving all extremities freely  BMET:  Recent Labs  12/14/14 0314  NA 142  K 4.1  CL 102  CO2 26  GLUCOSE 87  BUN 74*  CREATININE 5.05*  CALCIUM 9.4   Scheduled: . calcitRIOL  0.25 mcg Oral Daily  . diltiazem  180 mg Oral QHS  . ferrous sulfate  325 mg Oral Daily  . furosemide  120 mg Oral BID  . lipase/protease/amylase  24,000 Units Oral TID WC  . metoprolol succinate  75 mg Oral Daily  . sevelamer carbonate  800 mg Oral TID WC  . sodium bicarbonate  1,300 mg Oral BID  . sodium chloride  3 mL Intravenous Q12H    Assessment/Plan: #1 chronic kidney disease stage V: Creatinine 5.0 today, decreased from 5.4 yesterday, with stable GFR. Access present. Continue current management and will follow along  #2 secondary hyperparathyroidism: Continue calcitriol  #3  severe aortic stenosis: Anticipate some renal injury associated with contrast from preoperative workup though benefits outweigh risk.    LOS: 5 days   Charlott Rakes 12/15/2014,3:07 PM   Renal Attending: Stable.  Awaiting plans for AV treatment.  Agree with plans as articulated above.  Dalaney Needle C

## 2014-12-15 NOTE — Progress Notes (Signed)
SUBJECTIVE:  Feels ok  OBJECTIVE:   Vitals:   Filed Vitals:   12/14/14 1420 12/14/14 2036 12/15/14 0700 12/15/14 0926  BP: 147/79 157/80 145/85 141/97  Pulse: 69 72 89 82  Temp: 97.8 F (36.6 C) 98.1 F (36.7 C) 97.9 F (36.6 C) 97.6 F (36.4 C)  TempSrc: Oral Oral Oral Oral  Resp: 18 18 20 19   Height:      Weight:   174 lb 8 oz (79.153 kg)   SpO2: 95% 95% 97% 96%   I&O's:   Intake/Output Summary (Last 24 hours) at 12/15/14 1132 Last data filed at 12/15/14 1019  Gross per 24 hour  Intake   1683 ml  Output   2725 ml  Net  -1042 ml   TELEMETRY: Reviewed telemetry pt in AFib:     PHYSICAL EXAM General: Well developed, well nourished, in no acute distress Head:   Normal cephalic and atramatic  Lungs:   Clear bilaterally to auscultation. Heart:  Irregularly irregular,S1 S2  No JVD.   Abdomen: abdomen soft and non-tender Msk:  Back normal,  Normal strength and tone for age. Extremities:  No edema.   Neuro: Alert and oriented. Psych:  Normal affect, responds appropriately Skin: No rash   LABS: Basic Metabolic Panel:  Recent Labs  12/14/14 0314  NA 142  K 4.1  CL 102  CO2 26  GLUCOSE 87  BUN 74*  CREATININE 5.05*  CALCIUM 9.4  PHOS 6.9*   Liver Function Tests:  Recent Labs  12/14/14 0314  ALBUMIN 3.2*   No results for input(s): LIPASE, AMYLASE in the last 72 hours. CBC: No results for input(s): WBC, NEUTROABS, HGB, HCT, MCV, PLT in the last 72 hours. Cardiac Enzymes: No results for input(s): CKTOTAL, CKMB, CKMBINDEX, TROPONINI in the last 72 hours. BNP: Invalid input(s): POCBNP D-Dimer: No results for input(s): DDIMER in the last 72 hours. Hemoglobin A1C: No results for input(s): HGBA1C in the last 72 hours. Fasting Lipid Panel: No results for input(s): CHOL, HDL, LDLCALC, TRIG, CHOLHDL, LDLDIRECT in the last 72 hours. Thyroid Function Tests: No results for input(s): TSH, T4TOTAL, T3FREE, THYROIDAB in the last 72 hours.  Invalid  input(s): FREET3 Anemia Panel: No results for input(s): VITAMINB12, FOLATE, FERRITIN, TIBC, IRON, RETICCTPCT in the last 72 hours. Coag Panel:   Lab Results  Component Value Date   INR 2.08* 12/15/2014   INR 3.08* 12/14/2014   INR 2.70* 12/13/2014    RADIOLOGY: Dg Chest 2 View  12/10/2014   CLINICAL DATA:  Cough x3 months. SOB x8 months. No chest pain.Hx of HTN, CKD, PAF, Cardiomyopathy, aortic valve stenosis.  EXAM: CHEST  2 VIEW  COMPARISON:  10/13/2014  FINDINGS: Cardiopericardial silhouette is mildly enlarged. Aorta is mildly uncoiled. No mediastinal or hilar masses or evidence of adenopathy.  Mild interstitial prominence in the lower lungs. Minimal pleural effusions. No lung consolidation to suggest pneumonia. No pneumothorax.  Bony thorax is demineralized but grossly intact.  IMPRESSION: 1. Since prior study, mild interstitial thickening has developed in the lower lungs along with minimal pleural effusions. There is stable mild cardiomegaly. Findings suggest mild congestive heart failure. No convincing pneumonia.   Electronically Signed   By: Lajean Manes M.D.   On: 12/10/2014 19:14      ASSESSMENT: Kathyrn Lass:  Severe AS: Plan for cath when INR suitable.  Severe AS: GOing through TAVR eval.    Diastolic heart failure: Continue diuretics.  Follow kidney function. CRI-severe.  Edema:  LE edema is severe.  Elevate legs.   Jettie Booze, MD  12/15/2014  11:32 AM

## 2014-12-16 ENCOUNTER — Encounter (HOSPITAL_COMMUNITY)
Admission: EM | Disposition: A | Discharge status: Home / Self Care | Payer: Self-pay | Source: Home / Self Care | Attending: Cardiology

## 2014-12-16 DIAGNOSIS — Z7901 Long term (current) use of anticoagulants: Secondary | ICD-10-CM

## 2014-12-16 DIAGNOSIS — I350 Nonrheumatic aortic (valve) stenosis: Secondary | ICD-10-CM | POA: Diagnosis not present

## 2014-12-16 DIAGNOSIS — I482 Chronic atrial fibrillation: Secondary | ICD-10-CM | POA: Diagnosis not present

## 2014-12-16 DIAGNOSIS — R600 Localized edema: Secondary | ICD-10-CM | POA: Diagnosis not present

## 2014-12-16 DIAGNOSIS — I2510 Atherosclerotic heart disease of native coronary artery without angina pectoris: Secondary | ICD-10-CM

## 2014-12-16 DIAGNOSIS — I5033 Acute on chronic diastolic (congestive) heart failure: Secondary | ICD-10-CM | POA: Diagnosis not present

## 2014-12-16 HISTORY — PX: CARDIAC CATHETERIZATION: SHX172

## 2014-12-16 HISTORY — DX: Atherosclerotic heart disease of native coronary artery without angina pectoris: I25.10

## 2014-12-16 LAB — BASIC METABOLIC PANEL
ANION GAP: 12 (ref 5–15)
BUN: 71 mg/dL — ABNORMAL HIGH (ref 6–20)
CHLORIDE: 99 mmol/L — AB (ref 101–111)
CO2: 25 mmol/L (ref 22–32)
Calcium: 9.4 mg/dL (ref 8.9–10.3)
Creatinine, Ser: 4.64 mg/dL — ABNORMAL HIGH (ref 0.61–1.24)
GFR calc non Af Amer: 11 mL/min — ABNORMAL LOW (ref >60)
GFR, EST AFRICAN AMERICAN: 13 mL/min — AB (ref >60)
Glucose, Bld: 99 mg/dL (ref 65–99)
Potassium: 4.1 mmol/L (ref 3.5–5.1)
Sodium: 136 mmol/L (ref 135–145)

## 2014-12-16 LAB — POCT I-STAT 3, VENOUS BLOOD GAS (G3P V)
ACID-BASE DEFICIT: 1 mmol/L (ref 0.0–2.0)
BICARBONATE: 24.6 meq/L — AB (ref 20.0–24.0)
O2 Saturation: 68 %
TCO2: 26 mmol/L (ref 0–100)
pCO2, Ven: 41 mmHg — ABNORMAL LOW (ref 45.0–50.0)
pH, Ven: 7.385 — ABNORMAL HIGH (ref 7.250–7.300)
pO2, Ven: 36 mmHg (ref 30.0–45.0)

## 2014-12-16 LAB — GLUCOSE, CAPILLARY: GLUCOSE-CAPILLARY: 92 mg/dL (ref 65–99)

## 2014-12-16 LAB — CREATININE, SERUM
CREATININE: 4.76 mg/dL — AB (ref 0.61–1.24)
GFR, EST AFRICAN AMERICAN: 13 mL/min — AB (ref >60)
GFR, EST NON AFRICAN AMERICAN: 11 mL/min — AB (ref >60)

## 2014-12-16 LAB — CBC
HCT: 37.2 % — ABNORMAL LOW (ref 39.0–52.0)
HEMOGLOBIN: 12.1 g/dL — AB (ref 13.0–17.0)
MCH: 35.6 pg — ABNORMAL HIGH (ref 26.0–34.0)
MCHC: 32.5 g/dL (ref 30.0–36.0)
MCV: 109.4 fL — ABNORMAL HIGH (ref 78.0–100.0)
PLATELETS: 193 10*3/uL (ref 150–400)
RBC: 3.4 MIL/uL — ABNORMAL LOW (ref 4.22–5.81)
RDW: 19.3 % — AB (ref 11.5–15.5)
WBC: 6.2 10*3/uL (ref 4.0–10.5)

## 2014-12-16 LAB — PROTIME-INR
INR: 1.57 — ABNORMAL HIGH (ref 0.00–1.49)
Prothrombin Time: 18.8 seconds — ABNORMAL HIGH (ref 11.6–15.2)

## 2014-12-16 SURGERY — RIGHT/LEFT HEART CATH AND CORONARY ANGIOGRAPHY

## 2014-12-16 SURGERY — LEFT HEART CATH AND CORONARY ANGIOGRAPHY
Anesthesia: LOCAL

## 2014-12-16 MED ORDER — MIDAZOLAM HCL 2 MG/2ML IJ SOLN
INTRAMUSCULAR | Status: AC
  Filled 2014-12-16: qty 2

## 2014-12-16 MED ORDER — HEPARIN (PORCINE) IN NACL 2-0.9 UNIT/ML-% IJ SOLN
INTRAMUSCULAR | Status: DC | PRN
  Administered 2014-12-16: 15:00:00

## 2014-12-16 MED ORDER — LIDOCAINE HCL (PF) 1 % IJ SOLN
INTRAMUSCULAR | Status: AC
  Filled 2014-12-16: qty 30

## 2014-12-16 MED ORDER — FENTANYL CITRATE (PF) 100 MCG/2ML IJ SOLN
INTRAMUSCULAR | Status: AC
  Filled 2014-12-16: qty 2

## 2014-12-16 MED ORDER — SODIUM CHLORIDE 0.9 % IJ SOLN
3.0000 mL | Freq: Two times a day (BID) | INTRAMUSCULAR | Status: DC
  Administered 2014-12-16: 3 mL via INTRAVENOUS

## 2014-12-16 MED ORDER — SODIUM CHLORIDE 0.9 % IJ SOLN
3.0000 mL | Freq: Two times a day (BID) | INTRAMUSCULAR | Status: DC
  Administered 2014-12-16 – 2014-12-20 (×7): 3 mL via INTRAVENOUS

## 2014-12-16 MED ORDER — SODIUM CHLORIDE 0.9 % IV SOLN: 250.0000 mL | INTRAVENOUS | Status: DC | PRN

## 2014-12-16 MED ORDER — SODIUM CHLORIDE 0.9 % IV SOLN
INTRAVENOUS | Status: DC
  Administered 2014-12-16: 250 mL via INTRAVENOUS

## 2014-12-16 MED ORDER — MIDAZOLAM HCL 2 MG/2ML IJ SOLN
INTRAMUSCULAR | Status: DC | PRN
  Administered 2014-12-16: 1 mg via INTRAVENOUS

## 2014-12-16 MED ORDER — SODIUM CHLORIDE 0.9 % IJ SOLN: 3.0000 mL | INTRAMUSCULAR | Status: DC | PRN

## 2014-12-16 MED ORDER — SODIUM CHLORIDE 0.9 % IV SOLN: INTRAVENOUS | Status: DC

## 2014-12-16 MED ORDER — ASPIRIN 81 MG PO CHEW
81.0000 mg | CHEWABLE_TABLET | ORAL | Status: AC
  Administered 2014-12-16: 81 mg via ORAL

## 2014-12-16 MED ORDER — SODIUM CHLORIDE 0.9 % IV SOLN
INTRAVENOUS | Status: AC
  Administered 2014-12-16: 17:00:00 via INTRAVENOUS

## 2014-12-16 MED ORDER — HEPARIN SODIUM (PORCINE) 5000 UNIT/ML IJ SOLN
5000.0000 [IU] | Freq: Three times a day (TID) | INTRAMUSCULAR | Status: DC
  Administered 2014-12-17 – 2014-12-19 (×4): 5000 [IU] via SUBCUTANEOUS
  Filled 2014-12-16 (×6): qty 1

## 2014-12-16 MED ORDER — IOHEXOL 350 MG/ML SOLN
INTRAVENOUS | Status: DC | PRN
  Administered 2014-12-16: 30 mL via INTRA_ARTERIAL

## 2014-12-16 MED ORDER — HEPARIN (PORCINE) IN NACL 2-0.9 UNIT/ML-% IJ SOLN
INTRAMUSCULAR | Status: AC
  Filled 2014-12-16: qty 1500

## 2014-12-16 MED ORDER — FENTANYL CITRATE (PF) 100 MCG/2ML IJ SOLN
INTRAMUSCULAR | Status: DC | PRN
  Administered 2014-12-16: 25 ug via INTRAVENOUS

## 2014-12-16 SURGICAL SUPPLY — 14 items
CATH INFINITI 5FR MULTPACK ANG (CATHETERS) ×3 IMPLANT
CATH SWAN GANZ 7F STRAIGHT (CATHETERS) ×3 IMPLANT
DEVICE RAD COMP TR BAND LRG (VASCULAR PRODUCTS) ×3 IMPLANT
KIT HEART LEFT (KITS) ×3 IMPLANT
KIT HEART RIGHT NAMIC (KITS) ×3 IMPLANT
PACK CARDIAC CATHETERIZATION (CUSTOM PROCEDURE TRAY) ×3 IMPLANT
SHEATH PINNACLE 5F 10CM (SHEATH) ×3 IMPLANT
SHEATH PINNACLE 7F 10CM (SHEATH) ×3 IMPLANT
SYR MEDRAD MARK V 150ML (SYRINGE) ×3 IMPLANT
TRANSDUCER W/STOPCOCK (MISCELLANEOUS) ×6 IMPLANT
TUBING CIL FLEX 10 FLL-RA (TUBING) ×3 IMPLANT
WIRE EMERALD 3MM-J .025X260CM (WIRE) ×3 IMPLANT
WIRE EMERALD 3MM-J .035X150CM (WIRE) ×3 IMPLANT
WIRE SAFE-T 1.5MM-J .035X260CM (WIRE) ×3 IMPLANT

## 2014-12-16 NOTE — Progress Notes (Signed)
Subjective:  No SOB, he understands there is a good chance he will end up on HD after cath.  Objective:  Vital Signs in the last 24 hours: Temp:  [97.5 F (36.4 C)-97.9 F (36.6 C)] 97.9 F (36.6 C) (08/17 0530) Pulse Rate:  [64-101] 64 (08/17 0530) Resp:  [20] 20 (08/17 0530) BP: (124-151)/(73-92) 135/92 mmHg (08/17 0530) SpO2:  [93 %-97 %] 96 % (08/17 0530) Weight:  [174 lb 3.2 oz (79.017 kg)] 174 lb 3.2 oz (79.017 kg) (08/17 0530)  Intake/Output from previous day:  Intake/Output Summary (Last 24 hours) at 12/16/14 0926 Last data filed at 12/16/14 0600  Gross per 24 hour  Intake   1203 ml  Output   2500 ml  Net  -1297 ml    Physical Exam: General appearance: alert, cooperative and no distress Lungs: clear to auscultation bilaterally Heart: irregularly irregular rhythm and 2/6 AS murmur, short diastolic murmur Extremities: AVF LUE   Rate: 68  Rhythm: atrial fibrillation and 4 bt NSWCT  Lab Results: No results for input(s): WBC, HGB, PLT in the last 72 hours.  Recent Labs  12/15/14 1415 12/16/14 0414  NA 138 136  K 4.1 4.1  CL 102 99*  CO2 23 25  GLUCOSE 147* 99  BUN 71* 71*  CREATININE 4.76* 4.64*   No results for input(s): TROPONINI in the last 72 hours.  Invalid input(s): CK, MB  Recent Labs  12/16/14 0414  INR 1.57*    Scheduled Meds: . calcitRIOL  0.25 mcg Oral Daily  . diltiazem  180 mg Oral QHS  . ferrous sulfate  325 mg Oral Daily  . furosemide  120 mg Oral BID  . lipase/protease/amylase  24,000 Units Oral TID WC  . metoprolol succinate  75 mg Oral Daily  . sevelamer carbonate  800 mg Oral TID WC  . sodium bicarbonate  1,300 mg Oral BID  . sodium chloride  3 mL Intravenous Q12H  . sodium chloride  3 mL Intravenous Q12H   Continuous Infusions: . sodium chloride     PRN Meds:.sodium chloride, sodium chloride, acetaminophen, guaiFENesin-dextromethorphan, loperamide, ondansetron (ZOFRAN) IV, sodium chloride, sodium  chloride   Imaging: No results found.  Cardiac Studies: Echo 12/03/14 Study Conclusions  - Left ventricle: There was moderate concentric hypertrophy. The estimated ejection fraction was 55%. - Aortic valve: There was severe stenosis. There was mild regurgitation. - Mitral valve: Severely calcified annulus. There was mild to moderate regurgitation. Valve area by continuity equation (using LVOT flow): 1.25 cm^2. - Left atrium: The atrium was severely dilated. - Right atrium: The atrium was mildly dilated. - Atrial septum: No defect or patent foramen ovale was identified.    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 He is for Rt and Lt heart cath today for TAVR. Coumadin has been on hold- INR 1.53.    Principal Problem:   Acute on chronic diastolic heart failure Active Problems:   Aortic stenosis, severe   Chronic kidney disease, stage V   Essential hypertension   Chronic diastolic CHF, class 3   Chronic atrial fibrillation   Chronic anticoagulation   PLAN: Rt and Lt cath today.   Kerin Ransom PA-C 12/16/2014, 9:26 AM [phone]  I have examined the patient and reviewed assessment and plan and discussed with patient.  Agree with above as stated.  Scheduled for cath later  today.  Being evaluated for TAVR/  LE edema better.  Right leg ulcer, being evaluated for PAD as well.  Ronte Parker S.

## 2014-12-16 NOTE — Evaluation (Signed)
Physical Therapy Evaluation Patient Details Name: [name] MRN: [id number] DOB: 1940/06/16 Today's Date: 12/16/2014   History of Present Illness  [name] is a 74 y.o.male history of afib, HTN, HOCM, stage 5 CKD, chronic diastolic Hf, aortic stenosis admitted with SOB. Weight is up to 180 lbs, was 176 lbs during 11/19/14 clinic visit. Notes several weeks or worsening LE edema, weight gain, and SOB. Denies any chest pain. Compliant with diuretics and low sodium diet. Cardiac cath 8/17; determining appropriateness of possible TAVR  Clinical Impression   Pt admitted with above diagnosis. Pt currently with functional limitations due to the deficits listed below (see PT Problem List).  Pt will benefit from skilled PT to increase their independence and safety with mobility to allow discharge to the venue listed below.    6 Minute Walk Test  Distance - 446 ft      Pre    Post  BP     147/92    166/86  HR    77    83  SaO2    99%    90%  Modified Borg Dyspnea 5    9  RPE    11    19    5  Meter Walk Test  Trial   1) 6.93 sec   2) 7.16 sec  3) 6.20 sec  Average - 6.76 sec = 2.21 ft/sec   Clinical Frailty Scale - 4 Vulnerable               Follow Up Recommendations Other (comment) (Cardiac Rehab phase 2)    Equipment Recommendations  None recommended by PT    Recommendations for Other Services       Precautions / Restrictions Precautions Precaution Comments: DOE      Mobility  Bed Mobility                  Transfers Overall transfer level: Independent Equipment used: None                Ambulation/Gait Ambulation/Gait assistance: Supervision Ambulation Distance (Feet): 446 Feet (plus apprx 50) Assistive device:  (pushign IV pole) Gait Pattern/deviations: WFL(Within Functional Limits) Gait velocity: 1.2 ft/sec Gait velocity interpretation: at or above normal speed for age/gender General Gait Details: Cues to self-monitor  for activity tolerance; 2 standing rest breaks  Stairs            Wheelchair Mobility    Modified Rankin (Stroke Patients Only)       Balance                                             Pertinent Vitals/Pain Pain Assessment: No/denies pain    Home Living Family/patient expects to be discharged to:: Private residence Living Arrangements: Spouse/significant other Available Help at Discharge: Family;Available PRN/intermittently Type of Home: House Home Access: Level entry     Home Layout: Two level;Able to live on main level with bedroom/bathroom (but second floor has his office) Home Equipment: None      Prior Function Level of Independence: Independent               Hand Dominance        Extremity/Trunk Assessment   Upper Extremity Assessment: Overall WFL for tasks assessed           Lower Extremity Assessment: Overall WFL for tasks  assessed (Fatigue with incr amb distance)      Cervical / Trunk Assessment: Normal  Communication   Communication: No difficulties  Cognition Arousal/Alertness: Awake/alert Behavior During Therapy: WFL for tasks assessed/performed Overall Cognitive Status: Within Functional Limits for tasks assessed                      General Comments      Exercises        Assessment/Plan    PT Assessment Patient needs continued PT services  PT Diagnosis Generalized weakness   PT Problem List Cardiopulmonary status limiting activity  PT Treatment Interventions Gait training;Stair training;Functional mobility training;Therapeutic activities;Therapeutic exercise;Patient/family education   PT Goals (Current goals can be found in the Care Plan section) Acute Rehab PT Goals Patient Stated Goal: wants to feel better after all this (cardiac workup and decision-making) PT Goal Formulation: With patient Time For Goal Achievement: 12/30/14 Potential to Achieve Goals: Good Additional  Goals Additional Goal #1: Pt will have gait speed WNL, and Borg RPE of less than 3 after walking    Frequency Min 3X/week (will likely meet PT goals next session post cardiac intervention)   Barriers to discharge        Co-evaluation               End of Session   Activity Tolerance: Patient tolerated treatment well Patient left: in bed;Other (comment) (preparing to go for cardiac cath) Nurse Communication: Mobility status         Time: 1330-1355 (times are approx) PT Time Calculation (min) (ACUTE ONLY): 25 min   Charges:   PT Evaluation $Initial PT Evaluation Tier I: 1 Procedure PT Treatments $Physical Performance Test   PT G CodesQuin Hoop 12/16/2014, 2:26 PM  Roney Marion, [place] Pager [phone] Office [phone]

## 2014-12-16 NOTE — Progress Notes (Signed)
Assessment/Plan: #1 chronic kidney disease stage V: Creatinine 4.64 decreased from 5.4 yesterday, with stable GFR. Access present. Continue current management and will follow along  #2 severe aortic stenosis: Anticipate some renal injury associated with contrast from preoperative workup though benefits outweigh risk.  Subjective: Interval History: No new issues  Objective: Vital signs in last 24 hours: Temp:  [97.5 F (36.4 C)-97.9 F (36.6 C)] 97.5 F (36.4 C) (08/17 1344) Pulse Rate:  [0-114] 0 (08/17 1513) Resp:  [0-33] 18 (08/17 1525) BP: (135-168)/(83-105) 155/95 mmHg (08/17 1525) SpO2:  [0 %-99 %] 90 % (08/17 1525) Weight:  [79.017 kg (174 lb 3.2 oz)] 79.017 kg (174 lb 3.2 oz) (08/17 0530) Weight change: -0.136 kg (-4.8 oz)  Intake/Output from previous day: 08/16 0701 - 08/17 0700 In: 0712 [P.O.:1440; I.V.:3] Out: 3600 [Urine:3600] Intake/Output this shift: Total I/O In: 0  Out: 400 [Urine:400]  General appearance: alert and cooperative Resp: diminished breath sounds bilaterally Cardio: systolic murmur: systolic ejection, harsh, 2/6 Extremities: 1+ edema, AVF LUE  Lab Results: No results for input(s): WBC, HGB, HCT, PLT in the last 72 hours. BMET:  Recent Labs  12/15/14 1415 12/16/14 0414  NA 138 136  K 4.1 4.1  CL 102 99*  CO2 23 25  GLUCOSE 147* 99  BUN 71* 71*  CREATININE 4.76* 4.64*  CALCIUM 9.3 9.4   No results for input(s): PTH in the last 72 hours. Iron Studies: No results for input(s): IRON, TIBC, TRANSFERRIN, FERRITIN in the last 72 hours. Studies/Results: No results found.  Scheduled: . [MAR Hold] calcitRIOL  0.25 mcg Oral Daily  . [MAR Hold] diltiazem  180 mg Oral QHS  . [MAR Hold] ferrous sulfate  325 mg Oral Daily  . [MAR Hold] furosemide  120 mg Oral BID  . [MAR Hold] lipase/protease/amylase  24,000 Units Oral TID WC  . [MAR Hold] metoprolol succinate  75 mg Oral Daily  . [MAR Hold] sevelamer carbonate  800 mg Oral TID WC  . [MAR  Hold] sodium bicarbonate  1,300 mg Oral BID  . [MAR Hold] sodium chloride  3 mL Intravenous Q12H  . sodium chloride  3 mL Intravenous Q12H      LOS: 6 days   Calli Bashor C 12/16/2014,3:30 PM

## 2014-12-16 NOTE — H&P (View-Only) (Signed)
Subjective:  No SOB, he understands there is a good chance he will end up on HD after cath.  Objective:  Vital Signs in the last 24 hours: Temp:  [97.5 F (36.4 C)-97.9 F (36.6 C)] 97.9 F (36.6 C) (08/17 0530) Pulse Rate:  [64-101] 64 (08/17 0530) Resp:  [20] 20 (08/17 0530) BP: (124-151)/(73-92) 135/92 mmHg (08/17 0530) SpO2:  [93 %-97 %] 96 % (08/17 0530) Weight:  [174 lb 3.2 oz (79.017 kg)] 174 lb 3.2 oz (79.017 kg) (08/17 0530)  Intake/Output from previous day:  Intake/Output Summary (Last 24 hours) at 12/16/14 0926 Last data filed at 12/16/14 0600  Gross per 24 hour  Intake   1203 ml  Output   2500 ml  Net  -1297 ml    Physical Exam: General appearance: alert, cooperative and no distress Lungs: clear to auscultation bilaterally Heart: irregularly irregular rhythm and 2/6 AS murmur, short diastolic murmur Extremities: AVF LUE   Rate: 68  Rhythm: atrial fibrillation and 4 bt NSWCT  Lab Results: No results for input(s): WBC, HGB, PLT in the last 72 hours.  Recent Labs  12/15/14 1415 12/16/14 0414  NA 138 136  K 4.1 4.1  CL 102 99*  CO2 23 25  GLUCOSE 147* 99  BUN 71* 71*  CREATININE 4.76* 4.64*   No results for input(s): TROPONINI in the last 72 hours.  Invalid input(s): CK, MB  Recent Labs  12/16/14 0414  INR 1.57*    Scheduled Meds: . calcitRIOL  0.25 mcg Oral Daily  . diltiazem  180 mg Oral QHS  . ferrous sulfate  325 mg Oral Daily  . furosemide  120 mg Oral BID  . lipase/protease/amylase  24,000 Units Oral TID WC  . metoprolol succinate  75 mg Oral Daily  . sevelamer carbonate  800 mg Oral TID WC  . sodium bicarbonate  1,300 mg Oral BID  . sodium chloride  3 mL Intravenous Q12H  . sodium chloride  3 mL Intravenous Q12H   Continuous Infusions: . sodium chloride     PRN Meds:.sodium chloride, sodium chloride, acetaminophen, guaiFENesin-dextromethorphan, loperamide, ondansetron (ZOFRAN) IV, sodium chloride, sodium  chloride   Imaging: No results found.  Cardiac Studies: Echo 12/03/14 Study Conclusions  - Left ventricle: There was moderate concentric hypertrophy. The estimated ejection fraction was 55%. - Aortic valve: There was severe stenosis. There was mild regurgitation. - Mitral valve: Severely calcified annulus. There was mild to moderate regurgitation. Valve area by continuity equation (using LVOT flow): 1.25 cm^2. - Left atrium: The atrium was severely dilated. - Right atrium: The atrium was mildly dilated. - Atrial septum: No defect or patent foramen ovale was identified.    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 He is for Rt and Lt heart cath today for TAVR. Coumadin has been on hold- INR 1.53.    Principal Problem:   Acute on chronic diastolic heart failure Active Problems:   Aortic stenosis, severe   Chronic kidney disease, stage V   Essential hypertension   Chronic diastolic CHF, class 3   Chronic atrial fibrillation   Chronic anticoagulation   PLAN: Rt and Lt cath today.   Kerin Ransom PA-C 12/16/2014, 9:26 AM [phone]  I have examined the patient and reviewed assessment and plan and discussed with patient.  Agree with above as stated.  Scheduled for cath later  today.  Being evaluated for TAVR/  LE edema better.  Right leg ulcer, being evaluated for PAD as well.  Sianna Garofano S.

## 2014-12-16 NOTE — Interval H&P Note (Signed)
History and Physical Interval Note:  12/16/2014 2:26 PM  [name]  has presented today for surgery, with the diagnosis of aortic stenosis  The various methods of treatment have been discussed with the patient and family. After consideration of risks, benefits and other options for treatment, the patient has consented to  Procedure(s): Right/Left Heart Cath and Coronary Angiography (N/A) as a surgical intervention .  The patient's history has been reviewed, patient examined, no change in status, stable for surgery.  I have reviewed the patient's chart and labs.  Questions were answered to the patient's satisfaction.     [name]

## 2014-12-17 ENCOUNTER — Encounter (HOSPITAL_COMMUNITY): Payer: Self-pay | Admitting: Cardiovascular Disease

## 2014-12-17 DIAGNOSIS — I5033 Acute on chronic diastolic (congestive) heart failure: Secondary | ICD-10-CM | POA: Diagnosis not present

## 2014-12-17 DIAGNOSIS — I421 Obstructive hypertrophic cardiomyopathy: Secondary | ICD-10-CM | POA: Diagnosis not present

## 2014-12-17 DIAGNOSIS — I350 Nonrheumatic aortic (valve) stenosis: Secondary | ICD-10-CM | POA: Diagnosis not present

## 2014-12-17 DIAGNOSIS — Z7901 Long term (current) use of anticoagulants: Secondary | ICD-10-CM

## 2014-12-17 DIAGNOSIS — N186 End stage renal disease: Secondary | ICD-10-CM | POA: Diagnosis not present

## 2014-12-17 DIAGNOSIS — I120 Hypertensive chronic kidney disease with stage 5 chronic kidney disease or end stage renal disease: Secondary | ICD-10-CM | POA: Diagnosis not present

## 2014-12-17 DIAGNOSIS — I482 Chronic atrial fibrillation: Secondary | ICD-10-CM | POA: Diagnosis not present

## 2014-12-17 LAB — PROTIME-INR
INR: 1.48 (ref 0.00–1.49)
Prothrombin Time: 18 seconds — ABNORMAL HIGH (ref 11.6–15.2)

## 2014-12-17 MED ORDER — LIDOCAINE-PRILOCAINE 2.5-2.5 % EX CREA
TOPICAL_CREAM | CUTANEOUS | Status: DC | PRN
  Administered 2014-12-18: 05:00:00 via TOPICAL
  Filled 2014-12-17: qty 5

## 2014-12-17 NOTE — Progress Notes (Addendum)
Patient Name: [name] Tine Date of Encounter: 12/17/2014   SUBJECTIVE  Feels better. Denies chest pain, sob or palpitation.   CURRENT MEDS . calcitRIOL  0.25 mcg Oral Daily  . diltiazem  180 mg Oral QHS  . ferrous sulfate  325 mg Oral Daily  . furosemide  120 mg Oral BID  . heparin  5,000 Units Subcutaneous 3 times per day  . lipase/protease/amylase  24,000 Units Oral TID WC  . metoprolol succinate  75 mg Oral Daily  . sevelamer carbonate  800 mg Oral TID WC  . sodium bicarbonate  1,300 mg Oral BID  . sodium chloride  3 mL Intravenous Q12H  . sodium chloride  3 mL Intravenous Q12H    OBJECTIVE  Filed Vitals:   12/16/14 1900 12/16/14 1921 12/16/14 2000 12/17/14 0444  BP: 166/91 179/100 148/81 138/79  Pulse: 46 69 72 92  Temp:  97.5 F (36.4 C)  97.4 F (36.3 C)  TempSrc:  Oral  Oral  Resp: 18 18 17 19   Height:      Weight:    174 lb 6.1 oz (79.1 kg)  SpO2: 91% 93% 91% 90%    Intake/Output Summary (Last 24 hours) at 12/17/14 0710 Last data filed at 12/17/14 0300  Gross per 24 hour  Intake   1521 ml  Output   1650 ml  Net   -129 ml   Filed Weights   12/15/14 0700 12/16/14 0530 12/17/14 0444  Weight: 174 lb 8 oz (79.153 kg) 174 lb 3.2 oz (79.017 kg) 174 lb 6.1 oz (79.1 kg)    PHYSICAL EXAM  General: Pleasant, NAD. Neuro: Alert and oriented X 3. Moves all extremities spontaneously. Psych: Normal affect. HEENT:  Normal  Neck: Supple without bruits or JVD. Lungs:  Resp regular and unlabored, CTA. Heart: Ir Ir no s3, s4. 2-3 systolic murmur. Abdomen: Soft, non-tender, non-distended, BS + x 4.  Extremities: No clubbing, cyanosis or edema. DP  2+ and equal bilaterally. R groin cath site with bruise, no bruit. AVF LUE  Accessory Clinical Findings  CBC  Recent Labs  12/16/14 1723  WBC 6.2  HGB 12.1*  HCT 37.2*  MCV 109.4*  PLT 268   Basic Metabolic Panel  Recent Labs  12/15/14 1415 12/16/14 0414 12/16/14 1723  NA 138 136  --   K 4.1 4.1  --    CL 102 99*  --   CO2 23 25  --   GLUCOSE 147* 99  --   BUN 71* 71*  --   CREATININE 4.76* 4.64* 4.76*  CALCIUM 9.3 9.4  --    TELE  afib at rate of 70-90s. At times in 100s  Radiology/Studies  Dg Chest 2 View  12/10/2014   CLINICAL DATA:  Cough x3 months. SOB x8 months. No chest pain.Hx of HTN, CKD, PAF, Cardiomyopathy, aortic valve stenosis.  EXAM: CHEST  2 VIEW  COMPARISON:  10/13/2014  FINDINGS: Cardiopericardial silhouette is mildly enlarged. Aorta is mildly uncoiled. No mediastinal or hilar masses or evidence of adenopathy.  Mild interstitial prominence in the lower lungs. Minimal pleural effusions. No lung consolidation to suggest pneumonia. No pneumothorax.  Bony thorax is demineralized but grossly intact.  IMPRESSION: 1. Since prior study, mild interstitial thickening has developed in the lower lungs along with minimal pleural effusions. There is stable mild cardiomegaly. Findings suggest mild congestive heart failure. No convincing pneumonia.   Electronically Signed   By: Lajean Manes M.D.   On: 12/10/2014 19:14  Cath 12/16/14 Conclusion     Prox RCA lesion, 30% stenosed.  Dist RCA lesion, 25% stenosed.  Prox Cx lesion, 40% stenosed.  Assessment: 1. Calcified but nonobstructive coronary artery disease 2. 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 Dense calcification of the aortic valve annulus extending into the mitral valve annulus, and at least moderate calcification of the ascending aorta.  Plan: Continued evaluation by the Multidisciplinary Valve Team for TAVR versus conventional AVR     Coronary Findings    Dominance: Right   Left Main  The vessel is angiographically normal.     Left Anterior Descending  The vessel exhibits minimal luminal irregularities.     Left Circumflex   . Prox Cx lesion, 40% stenosed. calcified .     Right Coronary Artery  There is mild the vessel.   . Prox RCA lesion, 30% stenosed. diffuse .   [name]  Dist RCA lesion, 25% stenosed.      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 Coumadin has been on hold- INR 1.53.   Principal Problem:  Acute on chronic diastolic heart failure Active Problems:  Aortic stenosis, severe  Chronic kidney disease, stage V  Essential hypertension  Chronic diastolic CHF, class 3  Chronic atrial fibrillation  Chronic anticoagulation   PLAN: Cath showed nonobstructive CAD. Creatinine 4.76 stable post cath. INR 1.48. Coumadin on hold. BP stable today.   Signed, Bhagat,Bhavinkumar PA-C Pager [phone]  I have examined the patient and reviewed assessment and plan and discussed with patient.  Agree with above as stated.  S/p cath.  Right groin bruise.  He is asking about what other tests he needs.  He will need a cardiac CT scan.  Will discuss with Dr. Burt Knack and inform patient of plan.  Will restart Coumadin if no other invasive tests are planned.  [name] Caravello S.

## 2014-12-17 NOTE — Progress Notes (Signed)
Assessment/Plan: #1 chronic kidney disease stage V: Creatinine 4.8 increased from 4.6 yesterday, with stable GFR. Access present. Continue current management and will follow along  #2 severe aortic stenosis: Anticipate some renal injury associated with contrast from preoperative workup though benefits outweigh risk.  Renal Attending: Case discussed with pt and Dr. Burt Knack.  We feel best to proceed with dialysis to optimize pts status prior to further evaluation and procedures. Will begin today. [name] C   Subjective: Interval History: No new issues  Objective: Vital signs in last 24 hours: Temp:  [97.4 F (36.3 C)-97.7 F (36.5 C)] 97.7 F (36.5 C) (08/18 0755) Pulse Rate:  [0-114] 80 (08/18 0755) Resp:  [0-33] 18 (08/18 0755) BP: (135-179)/(79-113) 173/82 mmHg (08/18 0755) SpO2:  [0 %-99 %] 90 % (08/18 0755) Weight:  [174 lb 6.1 oz (79.1 kg)] 174 lb 6.1 oz (79.1 kg) (08/18 0444) Weight change: 2.9 oz (0.083 kg)  Intake/Output from previous day: 08/17 0701 - 08/18 0700 In: 1761 [P.O.:720; I.V.:1041] Out: 2250 [Urine:2250] Intake/Output this shift:    General appearance: alert and cooperative Resp: diminished breath sounds bilaterally Cardio: systolic murmur: systolic ejection, harsh, 2/6 Extremities: 1+ edema, AVF LUE  Lab Results:  Recent Labs  12/16/14 1723  WBC 6.2  HGB 12.1*  HCT 37.2*  PLT 193   BMET:   Recent Labs  12/15/14 1415 12/16/14 0414 12/16/14 1723  NA 138 136  --   K 4.1 4.1  --   CL 102 99*  --   CO2 23 25  --   GLUCOSE 147* 99  --   BUN 71* 71*  --   CREATININE 4.76* 4.64* 4.76*  CALCIUM 9.3 9.4  --    No results for input(s): PTH in the last 72 hours. Iron Studies: No results for input(s): IRON, TIBC, TRANSFERRIN, FERRITIN in the last 72 hours. Studies/Results: No results found.  Scheduled: . calcitRIOL  0.25 mcg Oral Daily  . diltiazem  180 mg Oral QHS  . ferrous sulfate  325 mg Oral Daily  . furosemide  120 mg Oral  BID  . heparin  5,000 Units Subcutaneous 3 times per day  . lipase/protease/amylase  24,000 Units Oral TID WC  . metoprolol succinate  75 mg Oral Daily  . sevelamer carbonate  800 mg Oral TID WC  . sodium bicarbonate  1,300 mg Oral BID  . sodium chloride  3 mL Intravenous Q12H  . sodium chloride  3 mL Intravenous Q12H      LOS: 7 days   [name], [name] 12/17/2014,11:51 AM

## 2014-12-17 NOTE — Progress Notes (Signed)
TAVR Team Note: Discussed case with Dr Florene Glen and have also reviewed with Dr Roxy Manns. Met with patient and his friend who is at the bedside. Plan gated cardiac CTA and CTA chest/abdomen/pelvis tomorrow. Pt likely to start hemodialysis this admission for persistent volume overload and advanced kidney disease. Further plans pending CTA results. Plan discussed with patient and questions answered. He is in agreement.  [name] 12/17/2014 4:04 PM

## 2014-12-17 NOTE — Care Management Important Message (Signed)
Important Message  Patient Details  Name: [name] MRN: [id number] Date of Birth: 08/20/1940   Medicare Important Message Given:  Yes-third notification given    Delorse Lek 12/17/2014, 2:24 PM

## 2014-12-18 ENCOUNTER — Inpatient Hospital Stay (HOSPITAL_COMMUNITY): Payer: MEDICARE

## 2014-12-18 DIAGNOSIS — I421 Obstructive hypertrophic cardiomyopathy: Secondary | ICD-10-CM | POA: Diagnosis not present

## 2014-12-18 DIAGNOSIS — I120 Hypertensive chronic kidney disease with stage 5 chronic kidney disease or end stage renal disease: Secondary | ICD-10-CM | POA: Diagnosis not present

## 2014-12-18 DIAGNOSIS — N186 End stage renal disease: Secondary | ICD-10-CM | POA: Diagnosis not present

## 2014-12-18 DIAGNOSIS — R600 Localized edema: Secondary | ICD-10-CM | POA: Insufficient documentation

## 2014-12-18 DIAGNOSIS — I5033 Acute on chronic diastolic (congestive) heart failure: Secondary | ICD-10-CM | POA: Diagnosis not present

## 2014-12-18 DIAGNOSIS — Z7901 Long term (current) use of anticoagulants: Secondary | ICD-10-CM | POA: Diagnosis not present

## 2014-12-18 DIAGNOSIS — I350 Nonrheumatic aortic (valve) stenosis: Secondary | ICD-10-CM

## 2014-12-18 LAB — RENAL FUNCTION PANEL
ALBUMIN: 3.3 g/dL — AB (ref 3.5–5.0)
Anion gap: 15 (ref 5–15)
BUN: 60 mg/dL — AB (ref 6–20)
CHLORIDE: 100 mmol/L — AB (ref 101–111)
CO2: 22 mmol/L (ref 22–32)
CREATININE: 4.26 mg/dL — AB (ref 0.61–1.24)
Calcium: 9.5 mg/dL (ref 8.9–10.3)
GFR calc Af Amer: 14 mL/min — ABNORMAL LOW (ref >60)
GFR, EST NON AFRICAN AMERICAN: 12 mL/min — AB (ref >60)
GLUCOSE: 137 mg/dL — AB (ref 65–99)
Phosphorus: 5.4 mg/dL — ABNORMAL HIGH (ref 2.5–4.6)
Potassium: 3.9 mmol/L (ref 3.5–5.1)
Sodium: 137 mmol/L (ref 135–145)

## 2014-12-18 LAB — CBC
HCT: 37.3 % — ABNORMAL LOW (ref 39.0–52.0)
HCT: 36.6 % — ABNORMAL LOW (ref 39.0–52.0)
Hemoglobin: 12.2 g/dL — ABNORMAL LOW (ref 13.0–17.0)
Hemoglobin: 12 g/dL — ABNORMAL LOW (ref 13.0–17.0)
MCH: 35.5 pg — ABNORMAL HIGH (ref 26.0–34.0)
MCH: 35.2 pg — AB (ref 26.0–34.0)
MCHC: 32.7 g/dL (ref 30.0–36.0)
MCHC: 32.8 g/dL (ref 30.0–36.0)
MCV: 108.4 fL — AB (ref 78.0–100.0)
MCV: 107.3 fL — AB (ref 78.0–100.0)
PLATELETS: 193 10*3/uL (ref 150–400)
PLATELETS: 175 10*3/uL (ref 150–400)
RBC: 3.44 MIL/uL — ABNORMAL LOW (ref 4.22–5.81)
RBC: 3.41 MIL/uL — ABNORMAL LOW (ref 4.22–5.81)
RDW: 19.1 % — AB (ref 11.5–15.5)
RDW: 18.8 % — AB (ref 11.5–15.5)
WBC: 8 10*3/uL (ref 4.0–10.5)
WBC: 6.5 10*3/uL (ref 4.0–10.5)

## 2014-12-18 LAB — PROTIME-INR
INR: 1.36 (ref 0.00–1.49)
Prothrombin Time: 16.9 seconds — ABNORMAL HIGH (ref 11.6–15.2)

## 2014-12-18 MED ORDER — SODIUM CHLORIDE 0.9 % IV SOLN: 100.0000 mL | INTRAVENOUS | Status: DC | PRN

## 2014-12-18 MED ORDER — IOHEXOL 350 MG/ML SOLN
75.0000 mL | Freq: Once | INTRAVENOUS | Status: AC | PRN
  Administered 2014-12-18: 75 mL via INTRAVENOUS

## 2014-12-18 MED ORDER — LIDOCAINE-PRILOCAINE 2.5-2.5 % EX CREA
1.0000 "application " | TOPICAL_CREAM | CUTANEOUS | Status: DC | PRN
  Administered 2014-12-21: 1 via TOPICAL

## 2014-12-18 MED ORDER — SODIUM CHLORIDE 0.9 % IV SOLN
100.0000 mL | INTRAVENOUS | Status: DC | PRN
Start: 2014-12-18 — End: 2014-12-22

## 2014-12-18 MED ORDER — LIDOCAINE-PRILOCAINE 2.5-2.5 % EX CREA
TOPICAL_CREAM | Freq: Once | CUTANEOUS | Status: AC
  Administered 2014-12-18: 13:00:00 via TOPICAL
  Filled 2014-12-18: qty 5

## 2014-12-18 MED ORDER — PENTAFLUOROPROP-TETRAFLUOROETH EX AERO: 1.0000 "application " | INHALATION_SPRAY | CUTANEOUS | Status: DC | PRN

## 2014-12-18 MED ORDER — ALTEPLASE 2 MG IJ SOLR
2.0000 mg | Freq: Once | INTRAMUSCULAR | Status: DC | PRN
  Filled 2014-12-18: qty 2

## 2014-12-18 MED ORDER — LOPERAMIDE HCL 2 MG PO CAPS
2.0000 mg | ORAL_CAPSULE | Freq: Two times a day (BID) | ORAL | Status: DC
  Administered 2014-12-18 – 2014-12-22 (×9): 2 mg via ORAL
  Filled 2014-12-18 (×9): qty 1

## 2014-12-18 MED ORDER — IOHEXOL 350 MG/ML SOLN
100.0000 mL | Freq: Once | INTRAVENOUS | Status: AC | PRN
  Administered 2014-12-18: 12:00:00 80 mL via INTRAVENOUS

## 2014-12-18 MED ORDER — HEPARIN SODIUM (PORCINE) 1000 UNIT/ML DIALYSIS
1000.0000 [IU] | INTRAMUSCULAR | Status: DC | PRN
  Filled 2014-12-18: qty 1

## 2014-12-18 MED ORDER — PENTAFLUOROPROP-TETRAFLUOROETH EX AERO
1.0000 "application " | INHALATION_SPRAY | CUTANEOUS | Status: DC | PRN
  Filled 2014-12-18: qty 30

## 2014-12-18 MED ORDER — NEPRO/CARBSTEADY PO LIQD
237.0000 mL | ORAL | Status: DC | PRN
  Filled 2014-12-18: qty 237

## 2014-12-18 MED ORDER — NEPRO/CARBSTEADY PO LIQD: 237.0000 mL | ORAL | Status: DC | PRN

## 2014-12-18 MED ORDER — HEPARIN SODIUM (PORCINE) 1000 UNIT/ML DIALYSIS
20.0000 [IU]/kg | INTRAMUSCULAR | Status: DC | PRN
  Filled 2014-12-18: qty 2

## 2014-12-18 MED ORDER — HEPARIN SODIUM (PORCINE) 1000 UNIT/ML DIALYSIS: 1000.0000 [IU] | INTRAMUSCULAR | Status: DC | PRN

## 2014-12-18 MED ORDER — LIDOCAINE-PRILOCAINE 2.5-2.5 % EX CREA
1.0000 "application " | TOPICAL_CREAM | CUTANEOUS | Status: DC | PRN
  Filled 2014-12-18 (×2): qty 5

## 2014-12-18 MED ORDER — HEPARIN SODIUM (PORCINE) 1000 UNIT/ML DIALYSIS: 20.0000 [IU]/kg | INTRAMUSCULAR | Status: DC | PRN

## 2014-12-18 MED ORDER — LIDOCAINE HCL (PF) 1 % IJ SOLN: 5.0000 mL | INTRAMUSCULAR | Status: DC | PRN

## 2014-12-18 MED ORDER — ALTEPLASE 2 MG IJ SOLR: 2.0000 mg | Freq: Once | INTRAMUSCULAR | Status: DC | PRN

## 2014-12-18 NOTE — Consult Note (Addendum)
WOC wound consult note Reason for Consult: Consult requested for right leg wound.  Pt states he bumped it at home several weeks ago.  He prefers to leave the wound open to air.  Discussed with patient that optimal plan of care is to keep the wound covered to promote moist healing and avoid possible infection while in the hospital. Wound type: Full thickness abrasion to right lower calf Measurement: 1.5X1.5X.2cm  Wound bed: Dry yellow wound bed with scabbed crusted edges.   Drainage (amount, consistency, odor) No odor or drainage Periwound: Intact skin surrounding  Dressing procedure/placement/frequency: Pt is resistant to using any topical treatment or dressings; he states he does not want the wound cleaned up or the scabbed areas removed and wanted to leave the wound open to air. He has used the pink foam dressings before and does not like them.  Convinced him to cover the wound with a bandaid to protect the sitet, but he expressed that he was not happy with this option either.  Please re-consult if further assistance is needed.  Thank-you,  Julien Girt MSN, [place], [place], [place], [place]

## 2014-12-18 NOTE — Plan of Care (Signed)
Problem: Phase I Progression Outcomes Goal: Voiding-avoid urinary catheter unless indicated Outcome: Completed/Met Date Met:  12/18/14 Pt with urinary stoma RLQ -self cath

## 2014-12-18 NOTE — Progress Notes (Signed)
Patient Name: [name] Tine Date of Encounter: 12/18/2014  Principal Problem:   Acute on chronic diastolic heart failure Active Problems:   Essential hypertension   Aortic stenosis, severe   Chronic diastolic CHF, class 3   Chronic kidney disease, stage V   Chronic atrial fibrillation   Chronic anticoagulation   SUBJECTIVE  He is very frustrated about timing of different test. He want to go and enjoy his life. States "I am doing nothing just sitting here". He wants to be done all testes quickly.   CURRENT MEDS . calcitRIOL  0.25 mcg Oral Daily  . diltiazem  180 mg Oral QHS  . ferrous sulfate  325 mg Oral Daily  . furosemide  120 mg Oral BID  . heparin  5,000 Units Subcutaneous 3 times per day  . lipase/protease/amylase  24,000 Units Oral TID WC  . metoprolol succinate  75 mg Oral Daily  . sevelamer carbonate  800 mg Oral TID WC  . sodium bicarbonate  1,300 mg Oral BID  . sodium chloride  3 mL Intravenous Q12H    OBJECTIVE  Filed Vitals:   12/17/14 1206 12/17/14 1647 12/17/14 2018 12/18/14 0254  BP: 173/94 147/97 169/91 171/94  Pulse: 77 67 78 77  Temp: 97.7 F (36.5 C) 97.5 F (36.4 C) 97.2 F (36.2 C) 98.1 F (36.7 C)  TempSrc: Oral Oral Oral Oral  Resp: 18 18 20 19   Height:      Weight:    175 lb 11.3 oz (79.7 kg)  SpO2: 93% 94% 98% 93%    Intake/Output Summary (Last 24 hours) at 12/18/14 0812 Last data filed at 12/18/14 8299  Gross per 24 hour  Intake    240 ml  Output   2700 ml  Net  -2460 ml   Filed Weights   12/16/14 0530 12/17/14 0444 12/18/14 0254  Weight: 174 lb 3.2 oz (79.017 kg) 174 lb 6.1 oz (79.1 kg) 175 lb 11.3 oz (79.7 kg)    PHYSICAL EXAM  General: Pleasant, NAD. Neuro: Alert and oriented X 3. Moves all extremities spontaneously. Psych: Normal affect. HEENT:  Normal  Neck: Supple without bruits or JVD. Lungs:  Resp regular and unlabored, CTA. Heart: Ir Ir no s3, s4. Systolic  murmurs. Abdomen: Soft, non-tender, non-distended,  BS + x 4.  Extremities: No clubbing, cyanosis or edema. DP 2+ and equal bilaterally. R groin cath site with bruise, no bruit. AVF LUE  Accessory Clinical Findings  CBC  Recent Labs  12/16/14 1723  WBC 6.2  HGB 12.1*  HCT 37.2*  MCV 109.4*  PLT 371   Basic Metabolic Panel  Recent Labs  12/15/14 1415 12/16/14 0414 12/16/14 1723  NA 138 136  --   K 4.1 4.1  --   CL 102 99*  --   CO2 23 25  --   GLUCOSE 147* 99  --   BUN 71* 71*  --   CREATININE 4.76* 4.64* 4.76*  CALCIUM 9.3 9.4  --     TELE  Afib   Radiology/Studies  Dg Chest 2 View  12/10/2014   CLINICAL DATA:  Cough x3 months. SOB x8 months. No chest pain.Hx of HTN, CKD, PAF, Cardiomyopathy, aortic valve stenosis.  EXAM: CHEST  2 VIEW  COMPARISON:  10/13/2014  FINDINGS: Cardiopericardial silhouette is mildly enlarged. Aorta is mildly uncoiled. No mediastinal or hilar masses or evidence of adenopathy.  Mild interstitial prominence in the lower lungs. Minimal pleural effusions. No lung consolidation to suggest pneumonia.  No pneumothorax.  Bony thorax is demineralized but grossly intact.  IMPRESSION: 1. Since prior study, mild interstitial thickening has developed in the lower lungs along with minimal pleural effusions. There is stable mild cardiomegaly. Findings suggest mild congestive heart failure. No convincing pneumonia.   Electronically Signed   By: Lajean Manes M.D.   On: 12/10/2014 19:14    ASSESSMENT AND PLAN   74 yo male with severe AS, CAF on Coumadin, stage 5 CRI not yet on HD (AVF placed Dec 2015), admitted 12/10/14 with acute on chronic diastolic CHF. Coumadin has been on hold.   Principal Problem:  Acute on chronic diastolic heart failure Active Problems:  Aortic stenosis, severe  Chronic kidney disease, stage V  Essential hypertension  Chronic diastolic CHF, class 3  Chronic atrial fibrillation  Chronic anticoagulation  Plan: The patient is schedule cardiac CTA, CTA chest/abdomen/pelvis  and HD today. To void contrast injury, will try to arrange CT first and then HD. I have reassure patient that further plans pending CTA results. MD to discuss further.   Signed, Bhagat,Bhavinkumar PA-C Pager [phone]  I have examined the patient and reviewed assessment and plan and discussed with patient.  Agree with above as stated.  Patient dissatisfied that he has waited so long. Nothing is happening quickly.  He is ready for dialysis.   Keny Donald S.

## 2014-12-18 NOTE — Progress Notes (Signed)
PT Cancellation Note  Patient Details Name: [name] MRN: [id number] DOB: [birth year]   Cancelled Treatment:    Reason Eval/Treat Not Completed: Patient declined, stating he has already walked today and is about to go to HD and would like to continue reading his book.  PT asked if it was ok if we continued to follow him, especially as he may go through TAVR Procedure and he said yes.  PT to follow acutely. Thanks,   Barbarann Ehlers. [place], [place], DPT [phone]   12/18/2014, 1:55 PM

## 2014-12-18 NOTE — Procedures (Signed)
Infiltration with dialysis after on for 27 minutes.  Diff cannulation. We will try again in AM. Latese Dufault C

## 2014-12-18 NOTE — Progress Notes (Signed)
Assessment/Plan: #1 chronic kidney disease stage V: Creatinine 4.3 today, improved from 4.8 on 8/17. Plan for dialysis today.  #2 severe aortic stenosis: Anticipate some renal injury associated with contrast from preoperative workup though benefits outweigh risk.  Subjective: Interval History: Frustrated that he has had to wait so long to have dialysis this morning.  Objective: Vital signs in last 24 hours: Temp:  [97.2 F (36.2 C)-98.4 F (36.9 C)] 98.4 F (36.9 C) (08/19 1334) Pulse Rate:  [72-80] 74 (08/19 1545) Resp:  [18-21] 19 (08/19 1545) BP: (140-171)/(80-94) 152/82 mmHg (08/19 1545) SpO2:  [93 %-98 %] 96 % (08/19 1334) Weight:  [175 lb 11.3 oz (79.7 kg)] 175 lb 11.3 oz (79.7 kg) (08/19 0254) Weight change: 1 lb 5.2 oz (0.6 kg)  Intake/Output from previous day: 08/18 0701 - 08/19 0700 In: 240 [P.O.:240] Out: 2700 [Urine:2700] Intake/Output this shift: Total I/O In: 500 [P.O.:500] Out: 1200 [Urine:1200]  General appearance: alert and cooperative Resp: diminished breath sounds bilaterally Cardio: systolic murmur: systolic ejection, harsh, 2/6 Extremities: 3+ edema bilateral lower extremities, AVF LUE, vascular lesion located just superior to the right medial malleolus without necrosis   Lab Results:  Recent Labs  12/16/14 1723 12/18/14 0734  WBC 6.2 8.0  HGB 12.1* 12.2*  HCT 37.2* 37.3*  PLT 193 193   BMET:   Recent Labs  12/16/14 0414 12/16/14 1723 12/18/14 0734  NA 136  --  137  K 4.1  --  3.9  CL 99*  --  100*  CO2 25  --  22  GLUCOSE 99  --  137*  BUN 71*  --  60*  CREATININE 4.64* 4.76* 4.26*  CALCIUM 9.4  --  9.5   Studies/Results: Ct Coronary Morp W/cta Cor W/score W/ca W/cm &/or Wo/cm  12/18/2014   EXAM: OVER-READ INTERPRETATION  CT CHEST  The following report is an over-read performed by radiologist Dr. Rebekah [facility] on 12/18/2014. This over-read does not include interpretation of cardiac or coronary anatomy  or pathology. The coronary calcium score/coronary CTA interpretation by the cardiologist is attached.  COMPARISON:  Chest CT 06/11/2014.  FINDINGS: Extracardiac findings better demonstrated on contemporaneously obtained CTA of the chest, abdomen and pelvis 12/18/2014.  IMPRESSION: Please see separate dictation for full description of extracardiac findings on contemporaneously obtained CTA of the chest, abdomen and pelvis 12/18/2014.   Electronically Signed   By: Vinnie Langton M.D.   On: 12/18/2014 13:08   Ct Angio Chest Aorta W/cm &/or Wo/cm  12/18/2014   CLINICAL DATA:  74 year old male with history of severe aortic stenosis. Preprocedural study prior to potential transcatheter aortic valve replacement (TAVR) procedure.  EXAM: CT ANGIOGRAPHY CHEST WITH CONTRAST  TECHNIQUE: Multidetector CT imaging of the chest was performed using the standard protocol during bolus administration of intravenous contrast. Multiplanar CT image reconstructions and MIPs were obtained to evaluate the vascular anatomy.  CONTRAST:  57mL OMNIPAQUE IOHEXOL 350 MG/ML SOLN  COMPARISON:  Chest CT 06/11/2014. CT the abdomen and pelvis 05/17/2012.  FINDINGS: CTA CHEST FINDINGS  Mediastinum/Lymph Nodes: Heart size is enlarged with left atrial dilatation. Filling defect in the tip of the left atrial appendage, concerning for left atrial appendage thrombus. There is no significant pericardial fluid, thickening or pericardial calcification. There is atherosclerosis of the thoracic aorta, the great vessels of the mediastinum and the coronary arteries, including calcified atherosclerotic plaque in the left main, left anterior descending, left circumflex and right coronary arteries. Severe thickening calcifications of the aortic valve. Profound  calcification of the mitral-aortic intervalvular fibrosa. Severe thickening of the mitral valve and mitral annulus. Dilatation of the pulmonic trunk up to 4.2 cm in diameter, suggesting pulmonary arterial  hypertension. Multiple prominent borderline enlarged and minimally enlarged mediastinal and bilateral hilar lymph nodes measuring up to 1 cm in short axis in the right hilum and 1.2 cm in short axis in the left hilum, similar to the prior examination from 06/11/2014. Esophagus is unremarkable in appearance. No axillary lymphadenopathy. Right subclavian artery is moderately tortuous and mildly calcified with minimum dimension of 9.6 x 9.1 mm proximally and 8.8 x 8.9 mm distally. Left subclavian artery is moderately tortuous and mildly calcified with a minimum dimension of 10.0 x 7.9 mm proximally, and 8.8 x 6.9 mm distally. The apex of the left ventricle is immediately deep to the interspace between the left sixth and seventh ribs anteriorly.  Lungs/Pleura: Diffuse bronchial wall thickening with moderate centrilobular and paraseptal emphysema. No acute consolidative airspace disease. 7 mm nodule in the left upper lobe with dense internal calcifications, compatible with a calcified granuloma, unchanged. 4 mm pulmonary nodules in the left lower lobe on 46 and 47 of series 507, both unchanged compared to prior CT the abdomen and pelvis 05/17/2012, considered benign. Small left and small to moderate right pleural effusions layering dependently, similar to the prior examination.  Musculoskeletal/Soft Tissues: There are no aggressive appearing lytic or blastic lesions noted in the visualized portions of the skeleton.  CTA ABDOMEN AND PELVIS FINDINGS  Hepatobiliary: Status post cholecystectomy. Liver has a slightly nodular contour, which could be indicative of underlying cirrhosis. No discrete cystic or solid hepatic lesions are identified on today's examination. No intra or extrahepatic biliary ductal dilatation.  Pancreas: 1.0 x 1.4 cm poorly defined low-attenuation (11 HU) lesion in the proximal body of the pancreas (image 167 of series 501) is indeterminate. This appears to be associated with some mild focal ductal  ectasia of the main pancreatic duct in the proximal body of the pancreas. No pancreatic or peripancreatic inflammatory changes.  Spleen: Unremarkable.  Adrenals/Urinary Tract: Numerous sub cm low-attenuation lesions in the kidneys bilaterally are too small to definitively characterize, but are favored to represent small cysts. Mild bilateral renal atrophy. Bilateral perinephric stranding. Mild bilateral hydroureter, without hydronephrosis. Status post radical cystectomy and neobladder, with right lower quadrant ostomy. Chronic adreniform thickening of the left adrenal gland is similar to prior examinations, presumably indicative of adrenal hyperplasia. Right adrenal gland is normal in appearance.  Stomach/Bowel: The appearance of the stomach is normal. No pathologic dilatation of small bowel or colon. Status post partial colectomy in the region of the ascending colon, presumably for neobladder.  Vascular/Lymphatic: Vascular findings and measurements pertinent to potential TAVR procedure, as detailed below. No lymphadenopathy noted in the abdomen or pelvis. There are numerous borderline enlarged retroperitoneal lymph nodes measuring up to 9 mm in the left para-aortic nodal station.  Reproductive: Postoperative changes of radical cystoprostatectomy are noted, with extensive dystrophic calcifications throughout the pelvic floor, particularly anteriorly.  Other: No significant volume of ascites. No pneumoperitoneum. Trace amount of edema in the presacral space.  Musculoskeletal: 7 mm of anterolisthesis of L4 upon L5. There are no aggressive appearing lytic or blastic lesions noted in the visualized portions of the skeleton.  VASCULAR MEASUREMENTS PERTINENT TO TAVR:  AORTA:  Minimal Aortic Diameter -  14 x 16 mm  Severity of Aortic Calcification -  severe  RIGHT PELVIS:  Right Common Iliac Artery -  Minimal Diameter - 8.4  x 6.5 mm  Tortuosity - mild  Calcification - severe  Right External Iliac Artery -  Minimal Diameter  - 6.9 x 4.8 mm  Tortuosity - moderate  Calcification - severe  Right Common Femoral Artery -  Minimal Diameter - 4.4 x 4.6 mm  Tortuosity - mild  Calcification - severe  LEFT PELVIS:  Left Common Iliac Artery -  Minimal Diameter - 6.3 x 5.7 mm  Tortuosity - mild  Calcification - severe  Left External Iliac Artery -  Minimal Diameter - 4.3 x 7.4 mm  Tortuosity - moderate  Calcification - severe  Left Common Femoral Artery -  Minimal Diameter - 4.8 x 2.9 mm  Tortuosity - mild  Calcification - severe  Review of the MIP images confirms the above findings.  IMPRESSION: 1. Vascular findings and measurements pertinent to potential TAVR procedure, as detailed above. This patient does not appear to have suitable pelvic arterial access. However, this patient may have suitable subclavian arterial access if clinically appropriate. 2. Cardiomegaly with left atrial dilatation. Notably, there is a filling defect in the tip of the left atrial appendage, concerning for potential left atrial appendage thrombus. Correlation with transesophageal echocardiography is suggested if not recently performed. 3. Severe thickening calcification of the aortic valve. In addition, there is very extensive calcification of the mitral aortic intervalvular fibrosa, which appears to encroach upon the left ventricular outflow tract in close proximity to the aortic annulus. This may have implications for planned TAVR procedure, and correlation with contemporaneously obtained cardiac CT results is recommended. 4. Dilatation of the pulmonic trunk (4.2 cm in diameter), suggesting pulmonary arterial hypertension. 5. Small left and small to moderate right pleural effusions are chronic and similar to prior examination. 6. Mild diffuse bronchial wall thickening with moderate centrilobular and mild paraseptal emphysema; imaging findings suggestive of underlying COPD. 7. 1.4 x 0.9 cm low-attenuation lesion in the proximal body of the pancreas with some associated  ductal ectasia of the main pancreatic duct. This is nonspecific, but the possibility of intraductal papillary mucinous neoplasm (IPMN) is not excluded, and further evaluation with nonemergent MRI of the abdomen with and without IV gadolinium is suggested in the 1 year to better evaluate this finding. This recommendation follows ACR consensus guidelines: Managing Incidental Findings on Abdominal CT: White Paper of the ACR Incidental Findings Committee. J Am Coll Radiol 2010;7:754-773. 8. Postoperative changes of radical cystoprostatectomy and neobladder formation for history of bladder cancer, as above. 9. Additional incidental findings, as above.   Electronically Signed   By: Vinnie Langton M.D.   On: 12/18/2014 14:09   Ct Angio Abd/pel W/ And/or W/o  12/18/2014   CLINICAL DATA:  74 year old male with history of severe aortic stenosis. Preprocedural study prior to potential transcatheter aortic valve replacement (TAVR) procedure.  EXAM: CT ANGIOGRAPHY CHEST WITH CONTRAST  TECHNIQUE: Multidetector CT imaging of the chest was performed using the standard protocol during bolus administration of intravenous contrast. Multiplanar CT image reconstructions and MIPs were obtained to evaluate the vascular anatomy.  CONTRAST:  47mL OMNIPAQUE IOHEXOL 350 MG/ML SOLN  COMPARISON:  Chest CT 06/11/2014. CT the abdomen and pelvis 05/17/2012.  FINDINGS: CTA CHEST FINDINGS  Mediastinum/Lymph Nodes: Heart size is enlarged with left atrial dilatation. Filling defect in the tip of the left atrial appendage, concerning for left atrial appendage thrombus. There is no significant pericardial fluid, thickening or pericardial calcification. There is atherosclerosis of the thoracic aorta, the great vessels of the mediastinum and the coronary arteries, including calcified  atherosclerotic plaque in the left main, left anterior descending, left circumflex and right coronary arteries. Severe thickening calcifications of the aortic valve.  Profound calcification of the mitral-aortic intervalvular fibrosa. Severe thickening of the mitral valve and mitral annulus. Dilatation of the pulmonic trunk up to 4.2 cm in diameter, suggesting pulmonary arterial hypertension. Multiple prominent borderline enlarged and minimally enlarged mediastinal and bilateral hilar lymph nodes measuring up to 1 cm in short axis in the right hilum and 1.2 cm in short axis in the left hilum, similar to the prior examination from 06/11/2014. Esophagus is unremarkable in appearance. No axillary lymphadenopathy. Right subclavian artery is moderately tortuous and mildly calcified with minimum dimension of 9.6 x 9.1 mm proximally and 8.8 x 8.9 mm distally. Left subclavian artery is moderately tortuous and mildly calcified with a minimum dimension of 10.0 x 7.9 mm proximally, and 8.8 x 6.9 mm distally. The apex of the left ventricle is immediately deep to the interspace between the left sixth and seventh ribs anteriorly.  Lungs/Pleura: Diffuse bronchial wall thickening with moderate centrilobular and paraseptal emphysema. No acute consolidative airspace disease. 7 mm nodule in the left upper lobe with dense internal calcifications, compatible with a calcified granuloma, unchanged. 4 mm pulmonary nodules in the left lower lobe on 46 and 47 of series 507, both unchanged compared to prior CT the abdomen and pelvis 05/17/2012, considered benign. Small left and small to moderate right pleural effusions layering dependently, similar to the prior examination.  Musculoskeletal/Soft Tissues: There are no aggressive appearing lytic or blastic lesions noted in the visualized portions of the skeleton.  CTA ABDOMEN AND PELVIS FINDINGS  Hepatobiliary: Status post cholecystectomy. Liver has a slightly nodular contour, which could be indicative of underlying cirrhosis. No discrete cystic or solid hepatic lesions are identified on today's examination. No intra or extrahepatic biliary ductal dilatation.   Pancreas: 1.0 x 1.4 cm poorly defined low-attenuation (11 HU) lesion in the proximal body of the pancreas (image 167 of series 501) is indeterminate. This appears to be associated with some mild focal ductal ectasia of the main pancreatic duct in the proximal body of the pancreas. No pancreatic or peripancreatic inflammatory changes.  Spleen: Unremarkable.  Adrenals/Urinary Tract: Numerous sub cm low-attenuation lesions in the kidneys bilaterally are too small to definitively characterize, but are favored to represent small cysts. Mild bilateral renal atrophy. Bilateral perinephric stranding. Mild bilateral hydroureter, without hydronephrosis. Status post radical cystectomy and neobladder, with right lower quadrant ostomy. Chronic adreniform thickening of the left adrenal gland is similar to prior examinations, presumably indicative of adrenal hyperplasia. Right adrenal gland is normal in appearance.  Stomach/Bowel: The appearance of the stomach is normal. No pathologic dilatation of small bowel or colon. Status post partial colectomy in the region of the ascending colon, presumably for neobladder.  Vascular/Lymphatic: Vascular findings and measurements pertinent to potential TAVR procedure, as detailed below. No lymphadenopathy noted in the abdomen or pelvis. There are numerous borderline enlarged retroperitoneal lymph nodes measuring up to 9 mm in the left para-aortic nodal station.  Reproductive: Postoperative changes of radical cystoprostatectomy are noted, with extensive dystrophic calcifications throughout the pelvic floor, particularly anteriorly.  Other: No significant volume of ascites. No pneumoperitoneum. Trace amount of edema in the presacral space.  Musculoskeletal: 7 mm of anterolisthesis of L4 upon L5. There are no aggressive appearing lytic or blastic lesions noted in the visualized portions of the skeleton.  VASCULAR MEASUREMENTS PERTINENT TO TAVR:  AORTA:  Minimal Aortic Diameter -  14 x 16  mm   Severity of Aortic Calcification -  severe  RIGHT PELVIS:  Right Common Iliac Artery -  Minimal Diameter - 8.4 x 6.5 mm  Tortuosity - mild  Calcification - severe  Right External Iliac Artery -  Minimal Diameter - 6.9 x 4.8 mm  Tortuosity - moderate  Calcification - severe  Right Common Femoral Artery -  Minimal Diameter - 4.4 x 4.6 mm  Tortuosity - mild  Calcification - severe  LEFT PELVIS:  Left Common Iliac Artery -  Minimal Diameter - 6.3 x 5.7 mm  Tortuosity - mild  Calcification - severe  Left External Iliac Artery -  Minimal Diameter - 4.3 x 7.4 mm  Tortuosity - moderate  Calcification - severe  Left Common Femoral Artery -  Minimal Diameter - 4.8 x 2.9 mm  Tortuosity - mild  Calcification - severe  Review of the MIP images confirms the above findings.  IMPRESSION: 1. Vascular findings and measurements pertinent to potential TAVR procedure, as detailed above. This patient does not appear to have suitable pelvic arterial access. However, this patient may have suitable subclavian arterial access if clinically appropriate. 2. Cardiomegaly with left atrial dilatation. Notably, there is a filling defect in the tip of the left atrial appendage, concerning for potential left atrial appendage thrombus. Correlation with transesophageal echocardiography is suggested if not recently performed. 3. Severe thickening calcification of the aortic valve. In addition, there is very extensive calcification of the mitral aortic intervalvular fibrosa, which appears to encroach upon the left ventricular outflow tract in close proximity to the aortic annulus. This may have implications for planned TAVR procedure, and correlation with contemporaneously obtained cardiac CT results is recommended. 4. Dilatation of the pulmonic trunk (4.2 cm in diameter), suggesting pulmonary arterial hypertension. 5. Small left and small to moderate right pleural effusions are chronic and similar to prior examination. 6. Mild diffuse bronchial wall  thickening with moderate centrilobular and mild paraseptal emphysema; imaging findings suggestive of underlying COPD. 7. 1.4 x 0.9 cm low-attenuation lesion in the proximal body of the pancreas with some associated ductal ectasia of the main pancreatic duct. This is nonspecific, but the possibility of intraductal papillary mucinous neoplasm (IPMN) is not excluded, and further evaluation with nonemergent MRI of the abdomen with and without IV gadolinium is suggested in the 1 year to better evaluate this finding. This recommendation follows ACR consensus guidelines: Managing Incidental Findings on Abdominal CT: White Paper of the ACR Incidental Findings Committee. J Am Coll Radiol 2010;7:754-773. 8. Postoperative changes of radical cystoprostatectomy and neobladder formation for history of bladder cancer, as above. 9. Additional incidental findings, as above.   Electronically Signed   By: Vinnie Langton M.D.   On: 12/18/2014 14:09    Scheduled: . calcitRIOL  0.25 mcg Oral Daily  . diltiazem  180 mg Oral QHS  . ferrous sulfate  325 mg Oral Daily  . furosemide  120 mg Oral BID  . heparin  5,000 Units Subcutaneous 3 times per day  . lipase/protease/amylase  24,000 Units Oral TID WC  . loperamide  2 mg Oral BID  . metoprolol succinate  75 mg Oral Daily  . sevelamer carbonate  800 mg Oral TID WC  . sodium bicarbonate  1,300 mg Oral BID  . sodium chloride  3 mL Intravenous Q12H      LOS: 8 days   Charlott Rakes 12/18/2014,6:26 PM   Renal Attending: I agree with the note and evaluation as planned above.  I have taken  a history today and examined the patient. Imari Sivertsen C

## 2014-12-19 DIAGNOSIS — I5033 Acute on chronic diastolic (congestive) heart failure: Secondary | ICD-10-CM | POA: Diagnosis not present

## 2014-12-19 LAB — BASIC METABOLIC PANEL
ANION GAP: 12 (ref 5–15)
BUN: 57 mg/dL — AB (ref 6–20)
CHLORIDE: 97 mmol/L — AB (ref 101–111)
CO2: 27 mmol/L (ref 22–32)
Calcium: 9.6 mg/dL (ref 8.9–10.3)
Creatinine, Ser: 4.23 mg/dL — ABNORMAL HIGH (ref 0.61–1.24)
GFR calc Af Amer: 15 mL/min — ABNORMAL LOW (ref >60)
GFR calc non Af Amer: 13 mL/min — ABNORMAL LOW (ref >60)
GLUCOSE: 100 mg/dL — AB (ref 65–99)
POTASSIUM: 3.4 mmol/L — AB (ref 3.5–5.1)
Sodium: 136 mmol/L (ref 135–145)

## 2014-12-19 LAB — PROTIME-INR
INR: 1.25 (ref 0.00–1.49)
Prothrombin Time: 15.9 seconds — ABNORMAL HIGH (ref 11.6–15.2)

## 2014-12-19 LAB — HEPARIN LEVEL (UNFRACTIONATED): Heparin Unfractionated: <0.1 IU/mL — ABNORMAL LOW (ref 0.30–0.70)

## 2014-12-19 MED ORDER — HEPARIN (PORCINE) IN NACL 100-0.45 UNIT/ML-% IJ SOLN
1200.0000 [IU]/h | INTRAMUSCULAR | Status: DC
  Administered 2014-12-19: 10:00:00 1000 [IU]/h via INTRAVENOUS
  Administered 2014-12-20: 1300 [IU]/h via INTRAVENOUS
  Administered 2014-12-21: 950 [IU]/h via INTRAVENOUS
  Filled 2014-12-19 (×4): qty 250

## 2014-12-19 MED ORDER — LIDOCAINE-PRILOCAINE 2.5-2.5 % EX CREA
TOPICAL_CREAM | CUTANEOUS | Status: AC
  Administered 2014-12-19: 15:00:00 via TOPICAL
  Filled 2014-12-19: qty 5

## 2014-12-19 MED ORDER — FUROSEMIDE 80 MG PO TABS
120.0000 mg | ORAL_TABLET | Freq: Two times a day (BID) | ORAL | Status: DC
  Administered 2014-12-19 – 2014-12-20 (×2): 120 mg via ORAL
  Filled 2014-12-19: qty 3
  Filled 2014-12-19 (×2): qty 1

## 2014-12-19 MED ORDER — HEPARIN BOLUS VIA INFUSION
2300.0000 [IU] | Freq: Once | INTRAVENOUS | Status: AC
  Administered 2014-12-19: 2300 [IU] via INTRAVENOUS
  Filled 2014-12-19: qty 2300

## 2014-12-19 NOTE — Progress Notes (Signed)
Patient returned from dialysis. No acute distress sitting up eating soup. Family at bedside.. Dressing to graft CDI.

## 2014-12-19 NOTE — Progress Notes (Signed)
ANTICOAGULATION CONSULT NOTE - f/u Consult  Pharmacy Consult for heparin Indication: atrial fibrillation  Allergies  Allergen Reactions  . Amiodarone Other (See Comments)    Severely reduced DLCO  . Oxycodone Hcl Rash and Other (See Comments)    Rash and itching    Patient Measurements: Height: 5\' 8"  (172.7 cm) Weight: 167 lb 5.3 oz (75.9 kg) IBW/kg (Calculated) : 68.4 Heparin Dosing Weight: 77kg  Vital Signs: Temp: 97.6 F (36.4 C) (08/20 1952) Temp Source: Oral (08/20 1952) BP: 140/106 mmHg (08/20 1952) Pulse Rate: 98 (08/20 1952)  Labs:  Recent Labs  12/17/14 0445 12/18/14 0250 12/18/14 0734 12/18/14 2020 12/19/14 0228 12/19/14 0825 12/19/14 2030  HGB  --   --  12.2* 12.0*  --   --   --   HCT  --   --  37.3* 36.6*  --   --   --   PLT  --   --  193 175  --   --   --   LABPROT 18.0* 16.9*  --   --  15.9*  --   --   INR 1.48 1.36  --   --  1.25  --   --   HEPARINUNFRC  --   --   --   --   --   --  <0.10*  CREATININE  --   --  4.26*  --   --  4.23*  --     Estimated Creatinine Clearance: 14.8 mL/min (by C-G formula based on Cr of 4.23).   Medical History: Past Medical History  Diagnosis Date  . Atrial fibrillation, persistent     a. cardioversion 11/21/10 . b. 05/2013 - experiencing more SOB. 24-hr holter showed avg HR 49, max 82, min 38bpm. ETT showed baseline junctional bradycardia, HR incr only to 90bpm with drop in BP. Saw EP - amiodarone reduced and eventually d/c'd with improvement in dyspnea.  . Hypertension   . Warfarin anticoagulation   . CKD (chronic kidney disease), stage V     a. Followed by Dr. Jimmy Footman. b. IV fistula placed 04/17/14.  [name] Aortic valve stenosis   . Orthostatic hypotension   . Diastolic dysfunction   . Arthritis   . Self-catheterizes urinary bladder   . Junctional bradycardia   . NSVT (nonsustained ventricular tachycardia)     a. By holter 01/2014.  [name] Shortness of breath dyspnea   . PVD (peripheral vascular disease)   .  Cardiomyopathy   . RTA (renal tubular acidosis)   . Hypomagnesemia   . Bladder cancer     a. 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  [name] A-V fistula   . CAD (coronary artery disease) 12/16/14    a. cath - nonobstrucive CAD 30% pro RCA; 25% dis RCA; 40T pro Lcx    Assessment: 74 year old male s/p cath 8/19 with hx of permanent afib on coumadin prior admission. Patient now undergoing TAVR evaluation. Patient was started on sq heparin this am and received am dose will now transition to IV heparin. Warfarin on hold until all procedures completed.  PM: HL <0.1 undectable   Goal of Therapy:  Heparin level 0.3-0.7 units/ml Monitor platelets by anticoagulation protocol: Yes   Plan:  Heparin 2300 units bolus Increase heparin infusion to 1300 units/hr Heparin level and CBC   Inza Mikrut S. Alford Highland, PharmD, [facility] Clinical Staff Pharmacist Pager [phone]   12/19/2014 9:33 PM

## 2014-12-19 NOTE — Progress Notes (Signed)
Assessment/Plan: #1 chronic kidney disease stage V: Creatinine 4.2 this morning, stable from 4.3 yesterday. Plan for dialysis today with K bath given potassium 3.4.  #2 severe aortic stenosis: Anticipate some renal injury associated with contrast from preoperative workup though benefits outweigh risk.  Subjective: No complaints this morning. Would like to reattempt dialysis today.  Objective: Vital signs in last 24 hours: Temp:  [97 F (36.1 C)-98.4 F (36.9 C)] 97.5 F (36.4 C) (08/20 0811) Pulse Rate:  [70-84] 84 (08/20 0811) Resp:  [18-21] 19 (08/20 0811) BP: (129-159)/(78-92) 129/78 mmHg (08/20 0811) SpO2:  [94 %-100 %] 97 % (08/20 0811) Weight:  [171 lb 8.3 oz (77.8 kg)-175 lb 0.7 oz (79.4 kg)] 171 lb 8.3 oz (77.8 kg) (08/20 0330) Weight change: -10.6 oz (-0.3 kg)  Intake/Output from previous day: 08/19 0701 - 08/20 0700 In: 500 [P.O.:500] Out: 3300 [Urine:3300] Intake/Output this shift:   General appearance: alert and cooperative Resp: Clear breath sounds in the anterior lung fields Cardio: systolic murmur: systolic ejection, harsh, 2/6 Extremities: 2+ edema bilateral lower extremities, AVF LUE, vascular lesion located just superior to the right medial malleolus without necrosis   Lab Results:  Recent Labs  12/18/14 0734 12/18/14 2020  WBC 8.0 6.5  HGB 12.2* 12.0*  HCT 37.3* 36.6*  PLT 193 175   BMET:   Recent Labs  12/16/14 1723 12/18/14 0734  NA  --  137  K  --  3.9  CL  --  100*  CO2  --  22  GLUCOSE  --  137*  BUN  --  60*  CREATININE 4.76* 4.26*  CALCIUM  --  9.5   Studies/Results: Ct Coronary Morp W/cta Cor W/score W/ca W/cm &/or Wo/cm  12/18/2014   ADDENDUM REPORT: 12/18/2014 18:37  CLINICAL DATA:  Aortic stenosis  EXAM: Cardiac TAVR CT  TECHNIQUE: The patient was scanned on a Philips 256 scanner. A 120 kV retrospective scan was triggered in the descending thoracic aorta at 111 HU's. Gantry rotation speed was 270 msecs and collimation was .9  mm. 5 mg of iv Metoprolol and no nitro were given. The 3D data set was reconstructed in 5% intervals of the R-R cycle. Systolic and diastolic phases were analyzed on a dedicated work station using MPR, MIP and VRT modes. The patient received 80 cc of contrast.  FINDINGS: Aortic Valve: Severe leaflet calcification with severely restricted leaflet opening. There is significant asymmetric calcification under the non-coronary leaflet extending into the LVOT that continues into mitral annulus.  Aorta: Normal caliber. Moderate diffuse calcifications in the entire thoracic aorta. No dissection.  Sinotubular Junction:  31 x 29 mm  Ascending Thoracic Aorta:  36 x 35 mm  Aortic Arch:  27 x 27 mm  Descending Thoracic Aorta:  30 x 30 mm  Sinus of Valsalva Measurements:  Non-coronary:  32 mm  Right -coronary:  33 mm  Left -coronary:  36 mm  Coronary Artery Height above Annulus:  Left Main:  11 mm  Right Coronary:  12 mm  Virtual Basal Annulus Measurements:  Maximum/Minimum Diameter:  30 x 23 mm  Perimeter:  97 mm  Area:  560 mm2  Optimum Fluoroscopic Angle for Delivery:  LAO 11 CAU 7  Other findings: Dilated pulmonary artery (41 x 37 mm) suggestive of pulmonary hypertension.  Normal pulmonary vein drainage.  Moderate concentric LVH.  Severely dilated left atrium.  There is a filling defect in the left atrial appendage that might represent incomplete contrast penetration vs a small thrombus.  IMPRESSION: 1. Severe leaflet calcification with severely restricted leaflet opening and annular measurement suitable for delivery of 29 mm Edward SAPIEN 3 valve.  There is significant asymmetric calcification located under the non-coronary leaflet extending into the LVOT.  2.  Sufficient coronary to annulus distance.  3. Optimum Fluoroscopic Angle for Delivery: LAO 11 CAU 7  4. There is a filling defect in the left atrial appendage that might represent incomplete contrast penetration vs a small thrombus.  Ena Dawley   Electronically  Signed   By: Ena Dawley   On: 12/18/2014 18:37   12/18/2014   EXAM: OVER-READ INTERPRETATION  CT CHEST  The following report is an over-read performed by radiologist Dr. Rebekah Chesterfield [facility] on 12/18/2014. This over-read does not include interpretation of cardiac or coronary anatomy or pathology. The coronary calcium score/coronary CTA interpretation by the cardiologist is attached.  COMPARISON:  Chest CT 06/11/2014.  FINDINGS: Extracardiac findings better demonstrated on contemporaneously obtained CTA of the chest, abdomen and pelvis 12/18/2014.  IMPRESSION: Please see separate dictation for full description of extracardiac findings on contemporaneously obtained CTA of the chest, abdomen and pelvis 12/18/2014.  Electronically Signed: By: Vinnie Langton M.D. On: 12/18/2014 13:08   Ct Angio Chest Aorta W/cm &/or Wo/cm  12/18/2014   CLINICAL DATA:  74 year old male with history of severe aortic stenosis. Preprocedural study prior to potential transcatheter aortic valve replacement (TAVR) procedure.  EXAM: CT ANGIOGRAPHY CHEST WITH CONTRAST  TECHNIQUE: Multidetector CT imaging of the chest was performed using the standard protocol during bolus administration of intravenous contrast. Multiplanar CT image reconstructions and MIPs were obtained to evaluate the vascular anatomy.  CONTRAST:  38mL OMNIPAQUE IOHEXOL 350 MG/ML SOLN  COMPARISON:  Chest CT 06/11/2014. CT the abdomen and pelvis 05/17/2012.  FINDINGS: CTA CHEST FINDINGS  Mediastinum/Lymph Nodes: Heart size is enlarged with left atrial dilatation. Filling defect in the tip of the left atrial appendage, concerning for left atrial appendage thrombus. There is no significant pericardial fluid, thickening or pericardial calcification. There is atherosclerosis of the thoracic aorta, the great vessels of the mediastinum and the coronary arteries, including calcified atherosclerotic plaque in the left main, left anterior descending, left  circumflex and right coronary arteries. Severe thickening calcifications of the aortic valve. Profound calcification of the mitral-aortic intervalvular fibrosa. Severe thickening of the mitral valve and mitral annulus. Dilatation of the pulmonic trunk up to 4.2 cm in diameter, suggesting pulmonary arterial hypertension. Multiple prominent borderline enlarged and minimally enlarged mediastinal and bilateral hilar lymph nodes measuring up to 1 cm in short axis in the right hilum and 1.2 cm in short axis in the left hilum, similar to the prior examination from 06/11/2014. Esophagus is unremarkable in appearance. No axillary lymphadenopathy. Right subclavian artery is moderately tortuous and mildly calcified with minimum dimension of 9.6 x 9.1 mm proximally and 8.8 x 8.9 mm distally. Left subclavian artery is moderately tortuous and mildly calcified with a minimum dimension of 10.0 x 7.9 mm proximally, and 8.8 x 6.9 mm distally. The apex of the left ventricle is immediately deep to the interspace between the left sixth and seventh ribs anteriorly.  Lungs/Pleura: Diffuse bronchial wall thickening with moderate centrilobular and paraseptal emphysema. No acute consolidative airspace disease. 7 mm nodule in the left upper lobe with dense internal calcifications, compatible with a calcified granuloma, unchanged. 4 mm pulmonary nodules in the left lower lobe on 46 and 47 of series 507, both unchanged compared to prior CT the abdomen and pelvis 05/17/2012,  considered benign. Small left and small to moderate right pleural effusions layering dependently, similar to the prior examination.  Musculoskeletal/Soft Tissues: There are no aggressive appearing lytic or blastic lesions noted in the visualized portions of the skeleton.  CTA ABDOMEN AND PELVIS FINDINGS  Hepatobiliary: Status post cholecystectomy. Liver has a slightly nodular contour, which could be indicative of underlying cirrhosis. No discrete cystic or solid hepatic  lesions are identified on today's examination. No intra or extrahepatic biliary ductal dilatation.  Pancreas: 1.0 x 1.4 cm poorly defined low-attenuation (11 HU) lesion in the proximal body of the pancreas (image 167 of series 501) is indeterminate. This appears to be associated with some mild focal ductal ectasia of the main pancreatic duct in the proximal body of the pancreas. No pancreatic or peripancreatic inflammatory changes.  Spleen: Unremarkable.  Adrenals/Urinary Tract: Numerous sub cm low-attenuation lesions in the kidneys bilaterally are too small to definitively characterize, but are favored to represent small cysts. Mild bilateral renal atrophy. Bilateral perinephric stranding. Mild bilateral hydroureter, without hydronephrosis. Status post radical cystectomy and neobladder, with right lower quadrant ostomy. Chronic adreniform thickening of the left adrenal gland is similar to prior examinations, presumably indicative of adrenal hyperplasia. Right adrenal gland is normal in appearance.  Stomach/Bowel: The appearance of the stomach is normal. No pathologic dilatation of small bowel or colon. Status post partial colectomy in the region of the ascending colon, presumably for neobladder.  Vascular/Lymphatic: Vascular findings and measurements pertinent to potential TAVR procedure, as detailed below. No lymphadenopathy noted in the abdomen or pelvis. There are numerous borderline enlarged retroperitoneal lymph nodes measuring up to 9 mm in the left para-aortic nodal station.  Reproductive: Postoperative changes of radical cystoprostatectomy are noted, with extensive dystrophic calcifications throughout the pelvic floor, particularly anteriorly.  Other: No significant volume of ascites. No pneumoperitoneum. Trace amount of edema in the presacral space.  Musculoskeletal: 7 mm of anterolisthesis of L4 upon L5. There are no aggressive appearing lytic or blastic lesions noted in the visualized portions of the  skeleton.  VASCULAR MEASUREMENTS PERTINENT TO TAVR:  AORTA:  Minimal Aortic Diameter -  14 x 16 mm  Severity of Aortic Calcification -  severe  RIGHT PELVIS:  Right Common Iliac Artery -  Minimal Diameter - 8.4 x 6.5 mm  Tortuosity - mild  Calcification - severe  Right External Iliac Artery -  Minimal Diameter - 6.9 x 4.8 mm  Tortuosity - moderate  Calcification - severe  Right Common Femoral Artery -  Minimal Diameter - 4.4 x 4.6 mm  Tortuosity - mild  Calcification - severe  LEFT PELVIS:  Left Common Iliac Artery -  Minimal Diameter - 6.3 x 5.7 mm  Tortuosity - mild  Calcification - severe  Left External Iliac Artery -  Minimal Diameter - 4.3 x 7.4 mm  Tortuosity - moderate  Calcification - severe  Left Common Femoral Artery -  Minimal Diameter - 4.8 x 2.9 mm  Tortuosity - mild  Calcification - severe  Review of the MIP images confirms the above findings.  IMPRESSION: 1. Vascular findings and measurements pertinent to potential TAVR procedure, as detailed above. This patient does not appear to have suitable pelvic arterial access. However, this patient may have suitable subclavian arterial access if clinically appropriate. 2. Cardiomegaly with left atrial dilatation. Notably, there is a filling defect in the tip of the left atrial appendage, concerning for potential left atrial appendage thrombus. Correlation with transesophageal echocardiography is suggested if not recently performed. 3. Severe  thickening calcification of the aortic valve. In addition, there is very extensive calcification of the mitral aortic intervalvular fibrosa, which appears to encroach upon the left ventricular outflow tract in close proximity to the aortic annulus. This may have implications for planned TAVR procedure, and correlation with contemporaneously obtained cardiac CT results is recommended. 4. Dilatation of the pulmonic trunk (4.2 cm in diameter), suggesting pulmonary arterial hypertension. 5. Small left and small to moderate  right pleural effusions are chronic and similar to prior examination. 6. Mild diffuse bronchial wall thickening with moderate centrilobular and mild paraseptal emphysema; imaging findings suggestive of underlying COPD. 7. 1.4 x 0.9 cm low-attenuation lesion in the proximal body of the pancreas with some associated ductal ectasia of the main pancreatic duct. This is nonspecific, but the possibility of intraductal papillary mucinous neoplasm (IPMN) is not excluded, and further evaluation with nonemergent MRI of the abdomen with and without IV gadolinium is suggested in the 1 year to better evaluate this finding. This recommendation follows ACR consensus guidelines: Managing Incidental Findings on Abdominal CT: White Paper of the ACR Incidental Findings Committee. J Am Coll Radiol 2010;7:754-773. 8. Postoperative changes of radical cystoprostatectomy and neobladder formation for history of bladder cancer, as above. 9. Additional incidental findings, as above.   Electronically Signed   By: Vinnie Langton M.D.   On: 12/18/2014 14:09   Ct Angio Abd/pel W/ And/or W/o  12/18/2014   CLINICAL DATA:  74 year old male with history of severe aortic stenosis. Preprocedural study prior to potential transcatheter aortic valve replacement (TAVR) procedure.  EXAM: CT ANGIOGRAPHY CHEST WITH CONTRAST  TECHNIQUE: Multidetector CT imaging of the chest was performed using the standard protocol during bolus administration of intravenous contrast. Multiplanar CT image reconstructions and MIPs were obtained to evaluate the vascular anatomy.  CONTRAST:  104mL OMNIPAQUE IOHEXOL 350 MG/ML SOLN  COMPARISON:  Chest CT 06/11/2014. CT the abdomen and pelvis 05/17/2012.  FINDINGS: CTA CHEST FINDINGS  Mediastinum/Lymph Nodes: Heart size is enlarged with left atrial dilatation. Filling defect in the tip of the left atrial appendage, concerning for left atrial appendage thrombus. There is no significant pericardial fluid, thickening or pericardial  calcification. There is atherosclerosis of the thoracic aorta, the great vessels of the mediastinum and the coronary arteries, including calcified atherosclerotic plaque in the left main, left anterior descending, left circumflex and right coronary arteries. Severe thickening calcifications of the aortic valve. Profound calcification of the mitral-aortic intervalvular fibrosa. Severe thickening of the mitral valve and mitral annulus. Dilatation of the pulmonic trunk up to 4.2 cm in diameter, suggesting pulmonary arterial hypertension. Multiple prominent borderline enlarged and minimally enlarged mediastinal and bilateral hilar lymph nodes measuring up to 1 cm in short axis in the right hilum and 1.2 cm in short axis in the left hilum, similar to the prior examination from 06/11/2014. Esophagus is unremarkable in appearance. No axillary lymphadenopathy. Right subclavian artery is moderately tortuous and mildly calcified with minimum dimension of 9.6 x 9.1 mm proximally and 8.8 x 8.9 mm distally. Left subclavian artery is moderately tortuous and mildly calcified with a minimum dimension of 10.0 x 7.9 mm proximally, and 8.8 x 6.9 mm distally. The apex of the left ventricle is immediately deep to the interspace between the left sixth and seventh ribs anteriorly.  Lungs/Pleura: Diffuse bronchial wall thickening with moderate centrilobular and paraseptal emphysema. No acute consolidative airspace disease. 7 mm nodule in the left upper lobe with dense internal calcifications, compatible with a calcified granuloma, unchanged. 4 mm pulmonary nodules  in the left lower lobe on 46 and 47 of series 507, both unchanged compared to prior CT the abdomen and pelvis 05/17/2012, considered benign. Small left and small to moderate right pleural effusions layering dependently, similar to the prior examination.  Musculoskeletal/Soft Tissues: There are no aggressive appearing lytic or blastic lesions noted in the visualized portions of  the skeleton.  CTA ABDOMEN AND PELVIS FINDINGS  Hepatobiliary: Status post cholecystectomy. Liver has a slightly nodular contour, which could be indicative of underlying cirrhosis. No discrete cystic or solid hepatic lesions are identified on today's examination. No intra or extrahepatic biliary ductal dilatation.  Pancreas: 1.0 x 1.4 cm poorly defined low-attenuation (11 HU) lesion in the proximal body of the pancreas (image 167 of series 501) is indeterminate. This appears to be associated with some mild focal ductal ectasia of the main pancreatic duct in the proximal body of the pancreas. No pancreatic or peripancreatic inflammatory changes.  Spleen: Unremarkable.  Adrenals/Urinary Tract: Numerous sub cm low-attenuation lesions in the kidneys bilaterally are too small to definitively characterize, but are favored to represent small cysts. Mild bilateral renal atrophy. Bilateral perinephric stranding. Mild bilateral hydroureter, without hydronephrosis. Status post radical cystectomy and neobladder, with right lower quadrant ostomy. Chronic adreniform thickening of the left adrenal gland is similar to prior examinations, presumably indicative of adrenal hyperplasia. Right adrenal gland is normal in appearance.  Stomach/Bowel: The appearance of the stomach is normal. No pathologic dilatation of small bowel or colon. Status post partial colectomy in the region of the ascending colon, presumably for neobladder.  Vascular/Lymphatic: Vascular findings and measurements pertinent to potential TAVR procedure, as detailed below. No lymphadenopathy noted in the abdomen or pelvis. There are numerous borderline enlarged retroperitoneal lymph nodes measuring up to 9 mm in the left para-aortic nodal station.  Reproductive: Postoperative changes of radical cystoprostatectomy are noted, with extensive dystrophic calcifications throughout the pelvic floor, particularly anteriorly.  Other: No significant volume of ascites. No  pneumoperitoneum. Trace amount of edema in the presacral space.  Musculoskeletal: 7 mm of anterolisthesis of L4 upon L5. There are no aggressive appearing lytic or blastic lesions noted in the visualized portions of the skeleton.  VASCULAR MEASUREMENTS PERTINENT TO TAVR:  AORTA:  Minimal Aortic Diameter -  14 x 16 mm  Severity of Aortic Calcification -  severe  RIGHT PELVIS:  Right Common Iliac Artery -  Minimal Diameter - 8.4 x 6.5 mm  Tortuosity - mild  Calcification - severe  Right External Iliac Artery -  Minimal Diameter - 6.9 x 4.8 mm  Tortuosity - moderate  Calcification - severe  Right Common Femoral Artery -  Minimal Diameter - 4.4 x 4.6 mm  Tortuosity - mild  Calcification - severe  LEFT PELVIS:  Left Common Iliac Artery -  Minimal Diameter - 6.3 x 5.7 mm  Tortuosity - mild  Calcification - severe  Left External Iliac Artery -  Minimal Diameter - 4.3 x 7.4 mm  Tortuosity - moderate  Calcification - severe  Left Common Femoral Artery -  Minimal Diameter - 4.8 x 2.9 mm  Tortuosity - mild  Calcification - severe  Review of the MIP images confirms the above findings.  IMPRESSION: 1. Vascular findings and measurements pertinent to potential TAVR procedure, as detailed above. This patient does not appear to have suitable pelvic arterial access. However, this patient may have suitable subclavian arterial access if clinically appropriate. 2. Cardiomegaly with left atrial dilatation. Notably, there is a filling defect in the tip of  the left atrial appendage, concerning for potential left atrial appendage thrombus. Correlation with transesophageal echocardiography is suggested if not recently performed. 3. Severe thickening calcification of the aortic valve. In addition, there is very extensive calcification of the mitral aortic intervalvular fibrosa, which appears to encroach upon the left ventricular outflow tract in close proximity to the aortic annulus. This may have implications for planned TAVR procedure, and  correlation with contemporaneously obtained cardiac CT results is recommended. 4. Dilatation of the pulmonic trunk (4.2 cm in diameter), suggesting pulmonary arterial hypertension. 5. Small left and small to moderate right pleural effusions are chronic and similar to prior examination. 6. Mild diffuse bronchial wall thickening with moderate centrilobular and mild paraseptal emphysema; imaging findings suggestive of underlying COPD. 7. 1.4 x 0.9 cm low-attenuation lesion in the proximal body of the pancreas with some associated ductal ectasia of the main pancreatic duct. This is nonspecific, but the possibility of intraductal papillary mucinous neoplasm (IPMN) is not excluded, and further evaluation with nonemergent MRI of the abdomen with and without IV gadolinium is suggested in the 1 year to better evaluate this finding. This recommendation follows ACR consensus guidelines: Managing Incidental Findings on Abdominal CT: White Paper of the ACR Incidental Findings Committee. J Am Coll Radiol 2010;7:754-773. 8. Postoperative changes of radical cystoprostatectomy and neobladder formation for history of bladder cancer, as above. 9. Additional incidental findings, as above.   Electronically Signed   By: Vinnie Langton M.D.   On: 12/18/2014 14:09    Scheduled: . calcitRIOL  0.25 mcg Oral Daily  . diltiazem  180 mg Oral QHS  . ferrous sulfate  325 mg Oral Daily  . furosemide  120 mg Oral BID  . lipase/protease/amylase  24,000 Units Oral TID WC  . loperamide  2 mg Oral BID  . metoprolol succinate  75 mg Oral Daily  . sevelamer carbonate  800 mg Oral TID WC  . sodium bicarbonate  1,300 mg Oral BID  . sodium chloride  3 mL Intravenous Q12H      LOS: 9 days   Charlott Rakes 12/19/2014,8:43 AM   Renal Attending Dialysis again today and hopeful for succesful cannulation.  Use high K bath. Kimmie Doren C

## 2014-12-19 NOTE — Progress Notes (Signed)
74 yo male hospitalized 8/11 with progressive shortness of breath and leg swelling. He describes a 4 week history of progressive and marked shortness of breath with any level of activity. He has also developed orthopnea, PND, and leg swelling. He has noted moderate limitation because of shortness of breath and generalized fatigue over approximately one year. However, symptoms have been much worse over the last month. He has been noted to have moderate aortic stenosis over recent years, but on his most recent echocardiogram his aortic stenosis had progressed into the severe range with a mean gradient of 44 mmHg and a dimensionless index less than 0.25. He was referred to Dr Cyndia Bent for outpatient evaluation, but required hospitalization before he could be seen in the office. The patient has been hospitalized with diastolic heart failure on 3 separate occasions in the last year in December 2015, January 2016, and again in February 2016. He states at this point he is unable to do any physical activity because of breathlessness. He previously had been able to play golf and remain relatively active. He denies any other significant physical limitation.  The patient has had progressive kidney disease. He is followed by Dr. Jimmy Footman. The patient has undergone AV fistula placement in the left arm and his fistula is functioning normally. He has not required hemodialysis today, but his creatinine is greater than 5 mg/dL. He has no past history of coronary artery disease, exertional angina, lightheadedness, or syncope.  Echo with severe AS and mod MR.  Cath with nonobstructive coronary disease. Mitral Valve The annulus is calcified. There is severe mitral annular calcification with a dense bar of calcium visualized from the aortic valve annulus to the mitral valve annulus    Aortic Valve The aortic valve is calcified. There is restricted aortic valve motion.   Aorta There is heavy calcification of the  ascending aorta     Subjective: No chest pain, no SOB.    Objective: Vital signs in last 24 hours: Temp:  [97 F (36.1 C)-98.4 F (36.9 C)] 98.1 F (36.7 C) (08/20 0330) Pulse Rate:  [70-84] 70 (08/20 0330) Resp:  [18-21] 19 (08/20 0330) BP: (134-159)/(80-92) 134/85 mmHg (08/20 0330) SpO2:  [94 %-100 %] 94 % (08/20 0330) Weight:  [171 lb 8.3 oz (77.8 kg)-175 lb 0.7 oz (79.4 kg)] 171 lb 8.3 oz (77.8 kg) (08/20 0330) Weight change: -10.6 oz (-0.3 kg) Last BM Date: 12/18/14 Intake/Output from previous day: -2800 ( since admit -9441)  Wt now 171 down from pk of 176 lbs 08/19 0701 - 08/20 0700 In: 500 [P.O.:500] Out: 3300 [Urine:3300] Intake/Output this shift:    PE: General:Pleasant affect, NAD Skin:Warm and dry, brisk capillary refill HEENT:normocephalic, sclera clear, mucus membranes moist Heart:S1S2 RRR with 3/6 murmur, no gallup, rub or click Lungs:clear without rales, rhonchi, or wheezes HDQ:QIWL, non tender, + BS, do not palpate liver spleen or masses Ext:2-3 + lower ext edema,  2+ radial pulses, small wound on rt ankle, will wear bandaid and ok for neosporin. Neuro:alert and oriented, MAE, follows commands, + facial symmetry Tele: a fib rate controlled  Short burst NSVT     Lab Results:  Recent Labs  12/18/14 0734 12/18/14 2020  WBC 8.0 6.5  HGB 12.2* 12.0*  HCT 37.3* 36.6*  PLT 193 175   BMET  Recent Labs  12/16/14 1723 12/18/14 0734  NA  --  137  K  --  3.9  CL  --  100*  CO2  --  22  GLUCOSE  --  137*  BUN  --  60*  CREATININE 4.76* 4.26*  CALCIUM  --  9.5   No results for input(s): TROPONINI in the last 72 hours.  Invalid input(s): CK, MB  No results found for: CHOL, HDL, LDLCALC, LDLDIRECT, TRIG, CHOLHDL No results found for: HGBA1C   Lab Results  Component Value Date   TSH 2.46 04/28/2014    Hepatic Function Panel  Recent Labs  12/18/14 0734  ALBUMIN 3.3*   No results for input(s): CHOL in the last 72 hours. No results for  input(s): PROTIME in the last 72 hours.     Studies/Results: Ct Coronary Morp W/cta Cor W/score W/ca W/cm &/or Wo/cm  12/18/2014   ADDENDUM REPORT: 12/18/2014 18:37  CLINICAL DATA:  Aortic stenosis  EXAM: Cardiac TAVR CT  TECHNIQUE: The patient was scanned on a Philips 256 scanner. A 120 kV retrospective scan was triggered in the descending thoracic aorta at 111 HU's. Gantry rotation speed was 270 msecs and collimation was .9 mm. 5 mg of iv Metoprolol and no nitro were given. The 3D data set was reconstructed in 5% intervals of the R-R cycle. Systolic and diastolic phases were analyzed on a dedicated work station using MPR, MIP and VRT modes. The patient received 80 cc of contrast.  FINDINGS: Aortic Valve: Severe leaflet calcification with severely restricted leaflet opening. There is significant asymmetric calcification under the non-coronary leaflet extending into the LVOT that continues into mitral annulus.  Aorta: Normal caliber. Moderate diffuse calcifications in the entire thoracic aorta. No dissection.  Sinotubular Junction:  31 x 29 mm  Ascending Thoracic Aorta:  36 x 35 mm  Aortic Arch:  27 x 27 mm  Descending Thoracic Aorta:  30 x 30 mm  Sinus of Valsalva Measurements:  Non-coronary:  32 mm  Right -coronary:  33 mm  Left -coronary:  36 mm  Coronary Artery Height above Annulus:  Left Main:  11 mm  Right Coronary:  12 mm  Virtual Basal Annulus Measurements:  Maximum/Minimum Diameter:  30 x 23 mm  Perimeter:  97 mm  Area:  560 mm2  Optimum Fluoroscopic Angle for Delivery:  LAO 11 CAU 7  Other findings: Dilated pulmonary artery (41 x 37 mm) suggestive of pulmonary hypertension.  Normal pulmonary vein drainage.  Moderate concentric LVH.  Severely dilated left atrium.  There is a filling defect in the left atrial appendage that might represent incomplete contrast penetration vs a small thrombus.  IMPRESSION: 1. Severe leaflet calcification with severely restricted leaflet opening and annular measurement  suitable for delivery of 29 mm Edward SAPIEN 3 valve.  There is significant asymmetric calcification located under the non-coronary leaflet extending into the LVOT.  2.  Sufficient coronary to annulus distance.  3. Optimum Fluoroscopic Angle for Delivery: LAO 11 CAU 7  4. There is a filling defect in the left atrial appendage that might represent incomplete contrast penetration vs a small thrombus.  Ena Dawley   Electronically Signed   By: Ena Dawley   On: 12/18/2014 18:37   12/18/2014   EXAM: OVER-READ INTERPRETATION  CT CHEST  The following report is an over-read performed by radiologist Dr. Rebekah [facility] on 12/18/2014. This over-read does not include interpretation of cardiac or coronary anatomy or pathology. The coronary calcium score/coronary CTA interpretation by the cardiologist is attached.  COMPARISON:  Chest CT 06/11/2014.  FINDINGS: Extracardiac findings better demonstrated on contemporaneously obtained CTA of the chest, abdomen and pelvis  12/18/2014.  IMPRESSION: Please see separate dictation for full description of extracardiac findings on contemporaneously obtained CTA of the chest, abdomen and pelvis 12/18/2014.  Electronically Signed: By: Vinnie Langton M.D. On: 12/18/2014 13:08   Ct Angio Chest Aorta W/cm &/or Wo/cm  12/18/2014   CLINICAL DATA:  74 year old male with history of severe aortic stenosis. Preprocedural study prior to potential transcatheter aortic valve replacement (TAVR) procedure.  EXAM: CT ANGIOGRAPHY CHEST WITH CONTRAST  TECHNIQUE: Multidetector CT imaging of the chest was performed using the standard protocol during bolus administration of intravenous contrast. Multiplanar CT image reconstructions and MIPs were obtained to evaluate the vascular anatomy.  CONTRAST:  50mL OMNIPAQUE IOHEXOL 350 MG/ML SOLN  COMPARISON:  Chest CT 06/11/2014. CT the abdomen and pelvis 05/17/2012.  FINDINGS: CTA CHEST FINDINGS  Mediastinum/Lymph Nodes: Heart  size is enlarged with left atrial dilatation. Filling defect in the tip of the left atrial appendage, concerning for left atrial appendage thrombus. There is no significant pericardial fluid, thickening or pericardial calcification. There is atherosclerosis of the thoracic aorta, the great vessels of the mediastinum and the coronary arteries, including calcified atherosclerotic plaque in the left main, left anterior descending, left circumflex and right coronary arteries. Severe thickening calcifications of the aortic valve. Profound calcification of the mitral-aortic intervalvular fibrosa. Severe thickening of the mitral valve and mitral annulus. Dilatation of the pulmonic trunk up to 4.2 cm in diameter, suggesting pulmonary arterial hypertension. Multiple prominent borderline enlarged and minimally enlarged mediastinal and bilateral hilar lymph nodes measuring up to 1 cm in short axis in the right hilum and 1.2 cm in short axis in the left hilum, similar to the prior examination from 06/11/2014. Esophagus is unremarkable in appearance. No axillary lymphadenopathy. Right subclavian artery is moderately tortuous and mildly calcified with minimum dimension of 9.6 x 9.1 mm proximally and 8.8 x 8.9 mm distally. Left subclavian artery is moderately tortuous and mildly calcified with a minimum dimension of 10.0 x 7.9 mm proximally, and 8.8 x 6.9 mm distally. The apex of the left ventricle is immediately deep to the interspace between the left sixth and seventh ribs anteriorly.  Lungs/Pleura: Diffuse bronchial wall thickening with moderate centrilobular and paraseptal emphysema. No acute consolidative airspace disease. 7 mm nodule in the left upper lobe with dense internal calcifications, compatible with a calcified granuloma, unchanged. 4 mm pulmonary nodules in the left lower lobe on 46 and 47 of series 507, both unchanged compared to prior CT the abdomen and pelvis 05/17/2012, considered benign. Small left and small to  moderate right pleural effusions layering dependently, similar to the prior examination.  Musculoskeletal/Soft Tissues: There are no aggressive appearing lytic or blastic lesions noted in the visualized portions of the skeleton.  CTA ABDOMEN AND PELVIS FINDINGS  Hepatobiliary: Status post cholecystectomy. Liver has a slightly nodular contour, which could be indicative of underlying cirrhosis. No discrete cystic or solid hepatic lesions are identified on today's examination. No intra or extrahepatic biliary ductal dilatation.  Pancreas: 1.0 x 1.4 cm poorly defined low-attenuation (11 HU) lesion in the proximal body of the pancreas (image 167 of series 501) is indeterminate. This appears to be associated with some mild focal ductal ectasia of the main pancreatic duct in the proximal body of the pancreas. No pancreatic or peripancreatic inflammatory changes.  Spleen: Unremarkable.  Adrenals/Urinary Tract: Numerous sub cm low-attenuation lesions in the kidneys bilaterally are too small to definitively characterize, but are favored to represent small cysts. Mild bilateral renal atrophy. Bilateral perinephric stranding. Mild  bilateral hydroureter, without hydronephrosis. Status post radical cystectomy and neobladder, with right lower quadrant ostomy. Chronic adreniform thickening of the left adrenal gland is similar to prior examinations, presumably indicative of adrenal hyperplasia. Right adrenal gland is normal in appearance.  Stomach/Bowel: The appearance of the stomach is normal. No pathologic dilatation of small bowel or colon. Status post partial colectomy in the region of the ascending colon, presumably for neobladder.  Vascular/Lymphatic: Vascular findings and measurements pertinent to potential TAVR procedure, as detailed below. No lymphadenopathy noted in the abdomen or pelvis. There are numerous borderline enlarged retroperitoneal lymph nodes measuring up to 9 mm in the left para-aortic nodal station.   Reproductive: Postoperative changes of radical cystoprostatectomy are noted, with extensive dystrophic calcifications throughout the pelvic floor, particularly anteriorly.  Other: No significant volume of ascites. No pneumoperitoneum. Trace amount of edema in the presacral space.  Musculoskeletal: 7 mm of anterolisthesis of L4 upon L5. There are no aggressive appearing lytic or blastic lesions noted in the visualized portions of the skeleton.  VASCULAR MEASUREMENTS PERTINENT TO TAVR:  AORTA:  Minimal Aortic Diameter -  14 x 16 mm  Severity of Aortic Calcification -  severe  RIGHT PELVIS:  Right Common Iliac Artery -  Minimal Diameter - 8.4 x 6.5 mm  Tortuosity - mild  Calcification - severe  Right External Iliac Artery -  Minimal Diameter - 6.9 x 4.8 mm  Tortuosity - moderate  Calcification - severe  Right Common Femoral Artery -  Minimal Diameter - 4.4 x 4.6 mm  Tortuosity - mild  Calcification - severe  LEFT PELVIS:  Left Common Iliac Artery -  Minimal Diameter - 6.3 x 5.7 mm  Tortuosity - mild  Calcification - severe  Left External Iliac Artery -  Minimal Diameter - 4.3 x 7.4 mm  Tortuosity - moderate  Calcification - severe  Left Common Femoral Artery -  Minimal Diameter - 4.8 x 2.9 mm  Tortuosity - mild  Calcification - severe  Review of the MIP images confirms the above findings.  IMPRESSION: 1. Vascular findings and measurements pertinent to potential TAVR procedure, as detailed above. This patient does not appear to have suitable pelvic arterial access. However, this patient may have suitable subclavian arterial access if clinically appropriate. 2. Cardiomegaly with left atrial dilatation. Notably, there is a filling defect in the tip of the left atrial appendage, concerning for potential left atrial appendage thrombus. Correlation with transesophageal echocardiography is suggested if not recently performed. 3. Severe thickening calcification of the aortic valve. In addition, there is very extensive  calcification of the mitral aortic intervalvular fibrosa, which appears to encroach upon the left ventricular outflow tract in close proximity to the aortic annulus. This may have implications for planned TAVR procedure, and correlation with contemporaneously obtained cardiac CT results is recommended. 4. Dilatation of the pulmonic trunk (4.2 cm in diameter), suggesting pulmonary arterial hypertension. 5. Small left and small to moderate right pleural effusions are chronic and similar to prior examination. 6. Mild diffuse bronchial wall thickening with moderate centrilobular and mild paraseptal emphysema; imaging findings suggestive of underlying COPD. 7. 1.4 x 0.9 cm low-attenuation lesion in the proximal body of the pancreas with some associated ductal ectasia of the main pancreatic duct. This is nonspecific, but the possibility of intraductal papillary mucinous neoplasm (IPMN) is not excluded, and further evaluation with nonemergent MRI of the abdomen with and without IV gadolinium is suggested in the 1 year to better evaluate this finding. This recommendation follows ACR  consensus guidelines: Managing Incidental Findings on Abdominal CT: White Paper of the ACR Incidental Findings Committee. J Am Coll Radiol 2010;7:754-773. 8. Postoperative changes of radical cystoprostatectomy and neobladder formation for history of bladder cancer, as above. 9. Additional incidental findings, as above.   Electronically Signed   By: Vinnie Langton M.D.   On: 12/18/2014 14:09   Ct Angio Abd/pel W/ And/or W/o  12/18/2014   CLINICAL DATA:  74 year old male with history of severe aortic stenosis. Preprocedural study prior to potential transcatheter aortic valve replacement (TAVR) procedure.  EXAM: CT ANGIOGRAPHY CHEST WITH CONTRAST  TECHNIQUE: Multidetector CT imaging of the chest was performed using the standard protocol during bolus administration of intravenous contrast. Multiplanar CT image reconstructions and MIPs were  obtained to evaluate the vascular anatomy.  CONTRAST:  44mL OMNIPAQUE IOHEXOL 350 MG/ML SOLN  COMPARISON:  Chest CT 06/11/2014. CT the abdomen and pelvis 05/17/2012.  FINDINGS: CTA CHEST FINDINGS  Mediastinum/Lymph Nodes: Heart size is enlarged with left atrial dilatation. Filling defect in the tip of the left atrial appendage, concerning for left atrial appendage thrombus. There is no significant pericardial fluid, thickening or pericardial calcification. There is atherosclerosis of the thoracic aorta, the great vessels of the mediastinum and the coronary arteries, including calcified atherosclerotic plaque in the left main, left anterior descending, left circumflex and right coronary arteries. Severe thickening calcifications of the aortic valve. Profound calcification of the mitral-aortic intervalvular fibrosa. Severe thickening of the mitral valve and mitral annulus. Dilatation of the pulmonic trunk up to 4.2 cm in diameter, suggesting pulmonary arterial hypertension. Multiple prominent borderline enlarged and minimally enlarged mediastinal and bilateral hilar lymph nodes measuring up to 1 cm in short axis in the right hilum and 1.2 cm in short axis in the left hilum, similar to the prior examination from 06/11/2014. Esophagus is unremarkable in appearance. No axillary lymphadenopathy. Right subclavian artery is moderately tortuous and mildly calcified with minimum dimension of 9.6 x 9.1 mm proximally and 8.8 x 8.9 mm distally. Left subclavian artery is moderately tortuous and mildly calcified with a minimum dimension of 10.0 x 7.9 mm proximally, and 8.8 x 6.9 mm distally. The apex of the left ventricle is immediately deep to the interspace between the left sixth and seventh ribs anteriorly.  Lungs/Pleura: Diffuse bronchial wall thickening with moderate centrilobular and paraseptal emphysema. No acute consolidative airspace disease. 7 mm nodule in the left upper lobe with dense internal calcifications,  compatible with a calcified granuloma, unchanged. 4 mm pulmonary nodules in the left lower lobe on 46 and 47 of series 507, both unchanged compared to prior CT the abdomen and pelvis 05/17/2012, considered benign. Small left and small to moderate right pleural effusions layering dependently, similar to the prior examination.  Musculoskeletal/Soft Tissues: There are no aggressive appearing lytic or blastic lesions noted in the visualized portions of the skeleton.  CTA ABDOMEN AND PELVIS FINDINGS  Hepatobiliary: Status post cholecystectomy. Liver has a slightly nodular contour, which could be indicative of underlying cirrhosis. No discrete cystic or solid hepatic lesions are identified on today's examination. No intra or extrahepatic biliary ductal dilatation.  Pancreas: 1.0 x 1.4 cm poorly defined low-attenuation (11 HU) lesion in the proximal body of the pancreas (image 167 of series 501) is indeterminate. This appears to be associated with some mild focal ductal ectasia of the main pancreatic duct in the proximal body of the pancreas. No pancreatic or peripancreatic inflammatory changes.  Spleen: Unremarkable.  Adrenals/Urinary Tract: Numerous sub cm low-attenuation lesions in the  kidneys bilaterally are too small to definitively characterize, but are favored to represent small cysts. Mild bilateral renal atrophy. Bilateral perinephric stranding. Mild bilateral hydroureter, without hydronephrosis. Status post radical cystectomy and neobladder, with right lower quadrant ostomy. Chronic adreniform thickening of the left adrenal gland is similar to prior examinations, presumably indicative of adrenal hyperplasia. Right adrenal gland is normal in appearance.  Stomach/Bowel: The appearance of the stomach is normal. No pathologic dilatation of small bowel or colon. Status post partial colectomy in the region of the ascending colon, presumably for neobladder.  Vascular/Lymphatic: Vascular findings and measurements  pertinent to potential TAVR procedure, as detailed below. No lymphadenopathy noted in the abdomen or pelvis. There are numerous borderline enlarged retroperitoneal lymph nodes measuring up to 9 mm in the left para-aortic nodal station.  Reproductive: Postoperative changes of radical cystoprostatectomy are noted, with extensive dystrophic calcifications throughout the pelvic floor, particularly anteriorly.  Other: No significant volume of ascites. No pneumoperitoneum. Trace amount of edema in the presacral space.  Musculoskeletal: 7 mm of anterolisthesis of L4 upon L5. There are no aggressive appearing lytic or blastic lesions noted in the visualized portions of the skeleton.  VASCULAR MEASUREMENTS PERTINENT TO TAVR:  AORTA:  Minimal Aortic Diameter -  14 x 16 mm  Severity of Aortic Calcification -  severe  RIGHT PELVIS:  Right Common Iliac Artery -  Minimal Diameter - 8.4 x 6.5 mm  Tortuosity - mild  Calcification - severe  Right External Iliac Artery -  Minimal Diameter - 6.9 x 4.8 mm  Tortuosity - moderate  Calcification - severe  Right Common Femoral Artery -  Minimal Diameter - 4.4 x 4.6 mm  Tortuosity - mild  Calcification - severe  LEFT PELVIS:  Left Common Iliac Artery -  Minimal Diameter - 6.3 x 5.7 mm  Tortuosity - mild  Calcification - severe  Left External Iliac Artery -  Minimal Diameter - 4.3 x 7.4 mm  Tortuosity - moderate  Calcification - severe  Left Common Femoral Artery -  Minimal Diameter - 4.8 x 2.9 mm  Tortuosity - mild  Calcification - severe  Review of the MIP images confirms the above findings.  IMPRESSION: 1. Vascular findings and measurements pertinent to potential TAVR procedure, as detailed above. This patient does not appear to have suitable pelvic arterial access. However, this patient may have suitable subclavian arterial access if clinically appropriate. 2. Cardiomegaly with left atrial dilatation. Notably, there is a filling defect in the tip of the left atrial appendage, concerning  for potential left atrial appendage thrombus. Correlation with transesophageal echocardiography is suggested if not recently performed. 3. Severe thickening calcification of the aortic valve. In addition, there is very extensive calcification of the mitral aortic intervalvular fibrosa, which appears to encroach upon the left ventricular outflow tract in close proximity to the aortic annulus. This may have implications for planned TAVR procedure, and correlation with contemporaneously obtained cardiac CT results is recommended. 4. Dilatation of the pulmonic trunk (4.2 cm in diameter), suggesting pulmonary arterial hypertension. 5. Small left and small to moderate right pleural effusions are chronic and similar to prior examination. 6. Mild diffuse bronchial wall thickening with moderate centrilobular and mild paraseptal emphysema; imaging findings suggestive of underlying COPD. 7. 1.4 x 0.9 cm low-attenuation lesion in the proximal body of the pancreas with some associated ductal ectasia of the main pancreatic duct. This is nonspecific, but the possibility of intraductal papillary mucinous neoplasm (IPMN) is not excluded, and further evaluation with nonemergent MRI  of the abdomen with and without IV gadolinium is suggested in the 1 year to better evaluate this finding. This recommendation follows ACR consensus guidelines: Managing Incidental Findings on Abdominal CT: White Paper of the ACR Incidental Findings Committee. J Am Coll Radiol 2010;7:754-773. 8. Postoperative changes of radical cystoprostatectomy and neobladder formation for history of bladder cancer, as above. 9. Additional incidental findings, as above.   Electronically Signed   By: Vinnie Langton M.D.   On: 12/18/2014 14:09    Medications: I have reviewed the patient's current medications. Scheduled Meds: . calcitRIOL  0.25 mcg Oral Daily  . diltiazem  180 mg Oral QHS  . ferrous sulfate  325 mg Oral Daily  . furosemide  120 mg Oral BID  .  heparin  5,000 Units Subcutaneous 3 times per day  . lipase/protease/amylase  24,000 Units Oral TID WC  . loperamide  2 mg Oral BID  . metoprolol succinate  75 mg Oral Daily  . sevelamer carbonate  800 mg Oral TID WC  . sodium bicarbonate  1,300 mg Oral BID  . sodium chloride  3 mL Intravenous Q12H   Continuous Infusions:  PRN Meds:.sodium chloride, sodium chloride, sodium chloride, acetaminophen, alteplase, feeding supplement (NEPRO CARB STEADY), guaiFENesin-dextromethorphan, heparin, heparin, lidocaine (PF), lidocaine-prilocaine, loperamide, ondansetron (ZOFRAN) IV, pentafluoroprop-tetrafluoroeth, sodium chloride  Assessment/Plan: Principal Problem:   Acute on chronic diastolic heart failure- now -> 9 L- for dialysis today- will need totransfer ? tele  Active Problems:   Essential hypertension mildly elevated, but will check after dialysis   Aortic stenosis, severe- working up for TAVR has had CTA-    Chronic diastolic CHF, class 3   Chronic kidney disease, stage V- for dialysis today   Chronic atrial fibrillation- rate controlled   Chronic anticoagulation- was on coumadin prior to admit now on heparin subcu CHA2DS2VASc of 2-3?? Resume coumadin vs IV heparin on sub cu heparin now   Bilateral edema of lower extremity still present   Leg wound, pt only wants neosporin will order    LOS: 9 days   Time spent with pt. : 15 minutes. [facility] R  Nurse Practitioner Certified Pager [phone] or after 5pm and on weekends call [phone] 12/19/2014, 7:40 AM  As above, patient seen and examined. No chest pain. Remains somewhat dyspneic. Plans for dialysis today. CT scans have been completed for TAVR evaluation. Await follow-up opinion by Dr. Burt Knack. Will change subcutaneous heparin to IV given permanent atrial fibrillation. Resume Coumadin once all procedures complete. Kirk Ruths

## 2014-12-19 NOTE — Progress Notes (Signed)
ANTICOAGULATION CONSULT NOTE - Initial Consult  Pharmacy Consult for heparin Indication: atrial fibrillation  Allergies  Allergen Reactions  . Amiodarone Other (See Comments)    Severely reduced DLCO  . Oxycodone Hcl Rash and Other (See Comments)    Rash and itching    Patient Measurements: Height: 5\' 8"  (172.7 cm) Weight: 171 lb 8.3 oz (77.8 kg) IBW/kg (Calculated) : 68.4 Heparin Dosing Weight: 77kg  Vital Signs: Temp: 97.5 F (36.4 C) (08/20 0811) Temp Source: Oral (08/20 0811) BP: 129/78 mmHg (08/20 0811) Pulse Rate: 84 (08/20 0811)  Labs:  Recent Labs  12/16/14 1723 12/17/14 0445 12/18/14 0250 12/18/14 0734 12/18/14 2020 12/19/14 0228  HGB 12.1*  --   --  12.2* 12.0*  --   HCT 37.2*  --   --  37.3* 36.6*  --   PLT 193  --   --  193 175  --   LABPROT  --  18.0* 16.9*  --   --  15.9*  INR  --  1.48 1.36  --   --  1.25  CREATININE 4.76*  --   --  4.26*  --   --     Estimated Creatinine Clearance: 14.7 mL/min (by C-G formula based on Cr of 4.26).   Medical History: Past Medical History  Diagnosis Date  . Atrial fibrillation, persistent     a. cardioversion 11/21/10 . b. 05/2013 - experiencing more SOB. 24-hr holter showed avg HR 49, max 82, min 38bpm. ETT showed baseline junctional bradycardia, HR incr only to 90bpm with drop in BP. Saw EP - amiodarone reduced and eventually d/c'd with improvement in dyspnea.  . Hypertension   . Warfarin anticoagulation   . CKD (chronic kidney disease), stage V     a. Followed by Dr. Jimmy Footman. b. IV fistula placed 04/17/14.  [name] Aortic valve stenosis   . Orthostatic hypotension   . Diastolic dysfunction   . Arthritis   . Self-catheterizes urinary bladder   . Junctional bradycardia   . NSVT (nonsustained ventricular tachycardia)     a. By holter 01/2014.  [name] Shortness of breath dyspnea   . PVD (peripheral vascular disease)   . Cardiomyopathy   . RTA (renal tubular acidosis)   . Hypomagnesemia   . Bladder cancer     a.  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  [name] A-V fistula   . CAD (coronary artery disease) 12/16/14    a. cath - nonobstrucive CAD 30% pro RCA; 25% dis RCA; 40T pro Lcx    Assessment: 74 year old male s/p cath 8/19 with hx of permanent afib on coumadin prior admission. Patient now undergoing TAVR evaluation. Patient was started on sq heparin this am and received am dose will now transition to IV heparin. Warfarin on hold until all procedures completed.   Goal of Therapy:  Heparin level 0.3-0.7 units/ml Monitor platelets by anticoagulation protocol: Yes   Plan:  Start heparin infusion at 1000 units/hr Check anti-Xa level in 8 hours and daily while on heparin Continue to monitor H&H and platelets  Erin Hearing PharmD., BCPS Clinical Pharmacist Pager [phone] 12/19/2014 8:56 AM

## 2014-12-20 DIAGNOSIS — I5033 Acute on chronic diastolic (congestive) heart failure: Secondary | ICD-10-CM | POA: Diagnosis not present

## 2014-12-20 LAB — CBC
HCT: 36.2 % — ABNORMAL LOW (ref 39.0–52.0)
HEMOGLOBIN: 11.8 g/dL — AB (ref 13.0–17.0)
MCH: 35.2 pg — AB (ref 26.0–34.0)
MCHC: 32.6 g/dL (ref 30.0–36.0)
MCV: 108.1 fL — AB (ref 78.0–100.0)
Platelets: 147 10*3/uL — ABNORMAL LOW (ref 150–400)
RBC: 3.35 MIL/uL — AB (ref 4.22–5.81)
RDW: 18.9 % — ABNORMAL HIGH (ref 11.5–15.5)
WBC: 8.1 10*3/uL (ref 4.0–10.5)

## 2014-12-20 LAB — RENAL FUNCTION PANEL
Albumin: 3.4 g/dL — ABNORMAL LOW (ref 3.5–5.0)
Anion gap: 12 (ref 5–15)
BUN: 41 mg/dL — AB (ref 6–20)
CALCIUM: 9 mg/dL (ref 8.9–10.3)
CHLORIDE: 100 mmol/L — AB (ref 101–111)
CO2: 26 mmol/L (ref 22–32)
CREATININE: 3.79 mg/dL — AB (ref 0.61–1.24)
GFR, EST AFRICAN AMERICAN: 17 mL/min — AB (ref >60)
GFR, EST NON AFRICAN AMERICAN: 14 mL/min — AB (ref >60)
Glucose, Bld: 104 mg/dL — ABNORMAL HIGH (ref 65–99)
Phosphorus: 4.5 mg/dL (ref 2.5–4.6)
Potassium: 3.9 mmol/L (ref 3.5–5.1)
SODIUM: 138 mmol/L (ref 135–145)

## 2014-12-20 LAB — PROTIME-INR
INR: 1.33 (ref 0.00–1.49)
Prothrombin Time: 16.6 seconds — ABNORMAL HIGH (ref 11.6–15.2)

## 2014-12-20 LAB — HEPARIN LEVEL (UNFRACTIONATED)
HEPARIN UNFRACTIONATED: 0.84 [IU]/mL — AB (ref 0.30–0.70)
Heparin Unfractionated: 0.91 IU/mL — ABNORMAL HIGH (ref 0.30–0.70)
Heparin Unfractionated: 0.57 IU/mL (ref 0.30–0.70)

## 2014-12-20 LAB — HEPATITIS B CORE ANTIBODY, TOTAL: Hep B Core Total Ab: NEGATIVE

## 2014-12-20 LAB — HEPATITIS B SURFACE ANTIGEN: HEP B S AG: NEGATIVE

## 2014-12-20 LAB — HEPATITIS B SURFACE ANTIBODY,QUALITATIVE: Hep B S Ab: NONREACTIVE

## 2014-12-20 NOTE — Progress Notes (Signed)
ANTICOAGULATION CONSULT NOTE - Follow Up Consult  Pharmacy Consult for Heparin  Indication: atrial fibrillation  Allergies  Allergen Reactions  . Amiodarone Other (See Comments)    Severely reduced DLCO  . Oxycodone Hcl Rash and Other (See Comments)    Rash and itching    Patient Measurements: Height: 5\' 8"  (172.7 cm) Weight: 163 lb (73.936 kg) IBW/kg (Calculated) : 68.4  Vital Signs: Temp: 98.8 F (37.1 C) (08/21 0339) Temp Source: Oral (08/21 0339) BP: 100/76 mmHg (08/21 0339) Pulse Rate: 87 (08/21 0339)  Labs:  Recent Labs  12/18/14 0250  12/18/14 0734 12/18/14 2020 12/19/14 0228 12/19/14 0825 12/19/14 2030 12/20/14 0433  HGB  --   < > 12.2* 12.0*  --   --   --  11.8*  HCT  --   --  37.3* 36.6*  --   --   --  36.2*  PLT  --   --  193 175  --   --   --  147*  LABPROT 16.9*  --   --   --  15.9*  --   --  16.6*  INR 1.36  --   --   --  1.25  --   --  1.33  HEPARINUNFRC  --   --   --   --   --   --  <0.10* 0.91*  CREATININE  --   --  4.26*  --   --  4.23*  --   --   < > = values in this interval not displayed.  Estimated Creatinine Clearance: 14.8 mL/min (by C-G formula based on Cr of 4.23).   Assessment: Supra-therapeutic heparin level, other labs reviewed, no issues per RN.   Goal of Therapy:  Heparin level 0.3-0.7 units/ml Monitor platelets by anticoagulation protocol: Yes   Plan:  -Decrease heparin to 1150 units/hr -1330 HL -Daily CBC/HL -Monitor for bleeding  [name] 12/20/2014,5:36 AM

## 2014-12-20 NOTE — Progress Notes (Signed)
ANTICOAGULATION CONSULT NOTE - Follow Up Consult  Pharmacy Consult for Heparin  Indication: atrial fibrillation  Allergies  Allergen Reactions  . Amiodarone Other (See Comments)    Severely reduced DLCO  . Oxycodone Hcl Rash and Other (See Comments)    Rash and itching    Patient Measurements: Height: 5\' 8"  (172.7 cm) Weight: 163 lb (73.936 kg) IBW/kg (Calculated) : 68.4  Vital Signs: Temp: 97.9 F (36.6 C) (08/21 1018) Temp Source: Oral (08/21 1018) BP: 137/84 mmHg (08/21 1018) Pulse Rate: 99 (08/21 1018)  Labs:  Recent Labs  12/18/14 0250  12/18/14 0734 12/18/14 2020 12/19/14 0228 12/19/14 0825 12/19/14 2030 12/20/14 0433 12/20/14 1123 12/20/14 1334  HGB  --   < > 12.2* 12.0*  --   --   --  11.8*  --   --   HCT  --   --  37.3* 36.6*  --   --   --  36.2*  --   --   PLT  --   --  193 175  --   --   --  147*  --   --   LABPROT 16.9*  --   --   --  15.9*  --   --  16.6*  --   --   INR 1.36  --   --   --  1.25  --   --  1.33  --   --   HEPARINUNFRC  --   --   --   --   --   --  <0.10* 0.91*  --  0.84*  CREATININE  --   --  4.26*  --   --  4.23*  --   --  3.79*  --   < > = values in this interval not displayed.  Estimated Creatinine Clearance: 16.5 mL/min (by C-G formula based on Cr of 3.79).   Assessment: 74 year old male s/p cath 8/19 with hx of permanent afib on coumadin prior admission. Patient now undergoing TAVR evaluation.  IV heparin continues to be above goal on 1150 units/hr. No bleeding issues noted, will reduce rate and recheck tonight.  Goal of Therapy:  Heparin level 0.3-0.7 units/ml Monitor platelets by anticoagulation protocol: Yes   Plan:  -Decrease heparin to 950 units/hr -2300 HL -Daily CBC/HL -Monitor for bleeding  Erin Hearing PharmD., BCPS Clinical Pharmacist Pager [phone] 12/20/2014 2:21 PM

## 2014-12-20 NOTE — Progress Notes (Signed)
    Subjective:  Denies CP or dyspnea   Objective:  Filed Vitals:   12/19/14 1830 12/19/14 1913 12/19/14 1952 12/20/14 0339  BP: 161/106 159/129 140/106 100/76  Pulse: 78 99 98 87  Temp:  97.4 F (36.3 C) 97.6 F (36.4 C) 98.8 F (37.1 C)  TempSrc:  Oral Oral Oral  Resp:  16 17 16   Height:      Weight:  167 lb 5.3 oz (75.9 kg)  163 lb (73.936 kg)  SpO2:  100% 94% 96%    Intake/Output from previous day:  Intake/Output Summary (Last 24 hours) at 12/20/14 5400 Last data filed at 12/20/14 0334  Gross per 24 hour  Intake      0 ml  Output   3151 ml  Net  -3151 ml    Physical Exam: Physical exam: Well-developed well-nourished in no acute distress.  Skin diffuse ecchymoses HEENT is normal.  Neck is supple.  Chest is clear to auscultation with normal expansion.  Cardiovascular exam is irregular, 3/6 systolic murmur Abdominal exam nontender or distended. No masses palpated. Extremities show 1+ ankle edema. neuro grossly intact    Lab Results: Basic Metabolic Panel:  Recent Labs  12/18/14 0734 12/19/14 0825  NA 137 136  K 3.9 3.4*  CL 100* 97*  CO2 22 27  GLUCOSE 137* 100*  BUN 60* 57*  CREATININE 4.26* 4.23*  CALCIUM 9.5 9.6  PHOS 5.4*  --    CBC:  Recent Labs  12/18/14 2020 12/20/14 0433  WBC 6.5 8.1  HGB 12.0* 11.8*  HCT 36.6* 36.2*  MCV 107.3* 108.1*  PLT 175 147*     Assessment/Plan:  1 Severe AS-ongoing evaluation for TAVR. Await follow-up plans per Dr. Roxy Manns and Dr. Burt Knack. 2 Permanent atrial fibrillation-continue cardizem and toprol for rate control; possible LAA thrombus on CT; continue IV heparin; resume coumadin once all procedures complete. 3 ESRD-dialysis initiated. 4 hypertension-blood pressure controlled. 5 acute on chronic diastolic congestive heart failure-volume improving following dialysis.  Kirk Ruths 12/20/2014, 9:52 AM

## 2014-12-20 NOTE — Progress Notes (Signed)
Assessment/Plan: #1 chronic kidney disease stage V: Plan for hemodialysis tomorrow.  #2 severe aortic stenosis: Pending further recommendations from cardiology.  Renal Attending: He had a successful dialysis yesterday for 3 hours with 2000cc removed and no more access issues other than ecchymosis. We will plan another HD in AM with volume removal in anticipation of TAVR intervention. We will stop sodium bicarbonate and furosemide. [name]   Subjective: No complaints this morning. Wife also present. Reports tolerating dialysis well yesterday with 2 L pulled off.  Objective: Vital signs in last 24 hours: Temp:  [97.4 F (36.3 [name])-98.8 F (37.1 [name])] 97.9 F (36.6 [name]) (08/21 1018) Pulse Rate:  [68-99] 99 (08/21 1018) Resp:  [16-19] 18 (08/21 1018) BP: (100-167)/(76-129) 137/84 mmHg (08/21 1018) SpO2:  [94 %-100 %] 98 % (08/21 1018) Weight:  [163 lb (73.936 kg)-171 lb 11.8 oz (77.9 kg)] 163 lb (73.936 kg) (08/21 0339) Weight change: -3 lb 4.9 oz (-1.5 kg)  Intake/Output from previous day: 08/20 0701 - 08/21 0700 In: -  Out: 9937 [Urine:1450; Stool:1] Intake/Output this shift:   General appearance: alert and cooperative Resp: Clear breath sounds in the posterior lung fields Cardio: systolic murmur: systolic ejection, harsh, 2/6 Extremities: 1+ edema bilateral lower extremities, AVF LUE, vascular lesion located just superior to the right medial malleolus without necrosis   Lab Results:  Recent Labs  12/18/14 2020 12/20/14 0433  WBC 6.5 8.1  HGB 12.0* 11.8*  HCT 36.6* 36.2*  PLT 175 147*   BMET:   Recent Labs  12/19/14 0825 12/20/14 1123  NA 136 138  K 3.4* 3.9  CL 97* 100*  CO2 27 26  GLUCOSE 100* 104*  BUN 57* 41*  CREATININE 4.23* 3.79*  CALCIUM 9.6 9.0   Studies/Results: No results found.  Scheduled: . calcitRIOL  0.25 mcg Oral Daily  . diltiazem  180 mg Oral QHS  . ferrous sulfate  325 mg Oral Daily  . furosemide  120 mg Oral BID  .  lipase/protease/amylase  24,000 Units Oral TID WC  . loperamide  2 mg Oral BID  . metoprolol succinate  75 mg Oral Daily  . sevelamer carbonate  800 mg Oral TID WC  . sodium bicarbonate  1,300 mg Oral BID  . sodium chloride  3 mL Intravenous Q12H      LOS: 10 days   [name] 12/20/2014,1:24 PM

## 2014-12-21 DIAGNOSIS — I5033 Acute on chronic diastolic (congestive) heart failure: Secondary | ICD-10-CM | POA: Diagnosis not present

## 2014-12-21 DIAGNOSIS — N185 Chronic kidney disease, stage 5: Secondary | ICD-10-CM

## 2014-12-21 DIAGNOSIS — I350 Nonrheumatic aortic (valve) stenosis: Secondary | ICD-10-CM | POA: Diagnosis not present

## 2014-12-21 DIAGNOSIS — Z7901 Long term (current) use of anticoagulants: Secondary | ICD-10-CM | POA: Diagnosis not present

## 2014-12-21 DIAGNOSIS — I482 Chronic atrial fibrillation: Secondary | ICD-10-CM | POA: Diagnosis not present

## 2014-12-21 LAB — PROTIME-INR
INR: 1.19 (ref 0.00–1.49)
Prothrombin Time: 15.3 seconds — ABNORMAL HIGH (ref 11.6–15.2)

## 2014-12-21 LAB — CBC
HEMATOCRIT: 37.2 % — AB (ref 39.0–52.0)
Hemoglobin: 12.7 g/dL — ABNORMAL LOW (ref 13.0–17.0)
MCH: 35.9 pg — ABNORMAL HIGH (ref 26.0–34.0)
MCHC: 34.1 g/dL (ref 30.0–36.0)
MCV: 105.1 fL — AB (ref 78.0–100.0)
PLATELETS: 148 10*3/uL — AB (ref 150–400)
RBC: 3.54 MIL/uL — ABNORMAL LOW (ref 4.22–5.81)
RDW: 18.7 % — AB (ref 11.5–15.5)
WBC: 9.5 10*3/uL (ref 4.0–10.5)

## 2014-12-21 LAB — HEPARIN LEVEL (UNFRACTIONATED): Heparin Unfractionated: 0.32 IU/mL (ref 0.30–0.70)

## 2014-12-21 MED ORDER — HEPARIN SODIUM (PORCINE) 1000 UNIT/ML DIALYSIS: 1000.0000 [IU] | INTRAMUSCULAR | Status: DC | PRN

## 2014-12-21 MED ORDER — NEPRO/CARBSTEADY PO LIQD: 237.0000 mL | ORAL | Status: DC | PRN

## 2014-12-21 MED ORDER — ALTEPLASE 2 MG IJ SOLR: 2.0000 mg | Freq: Once | INTRAMUSCULAR | Status: DC | PRN

## 2014-12-21 MED ORDER — SODIUM CHLORIDE 0.9 % IV SOLN: 100.0000 mL | INTRAVENOUS | Status: DC | PRN

## 2014-12-21 MED ORDER — LIDOCAINE-PRILOCAINE 2.5-2.5 % EX CREA: 1.0000 "application " | TOPICAL_CREAM | CUTANEOUS | Status: DC | PRN

## 2014-12-21 MED ORDER — LIDOCAINE HCL (PF) 1 % IJ SOLN: 5.0000 mL | INTRAMUSCULAR | Status: DC | PRN

## 2014-12-21 MED ORDER — PENTAFLUOROPROP-TETRAFLUOROETH EX AERO: 1.0000 "application " | INHALATION_SPRAY | CUTANEOUS | Status: DC | PRN

## 2014-12-21 NOTE — Progress Notes (Signed)
Subjective: No complaints this morning.   Objective: Vital signs in last 24 hours: Temp:  [97.7 F (36.5 C)-98.1 F (36.7 C)] 98.1 F (36.7 C) (08/22 0400) Pulse Rate:  [73-80] 73 (08/22 0400) Resp:  [17-20] 20 (08/22 0400) BP: (144-151)/(92-93) 144/92 mmHg (08/22 0400) SpO2:  [94 %-100 %] 100 % (08/22 0400) Weight:  [171 lb 4.8 oz (77.701 kg)] 171 lb 4.8 oz (77.701 kg) (08/22 0400) Weight change: -7 oz (-0.199 kg)  Intake/Output from previous day: 08/21 0701 - 08/22 0700 In: -  Out: 400 [Urine:400] Intake/Output this shift: Total I/O In: 360 [P.O.:360] Out: 600 [Urine:600]  General appearance: alert and cooperative, no acute distress Resp: Clear breath sounds in the posterior lung fields Cardio: systolic murmur: systolic ejection, harsh, 2/6 Extremities: 1+ edema bilateral lower extremities, AVF LUE with surrounding ecchymosis  Lab Results:  Recent Labs  12/20/14 0433 12/21/14 0805  WBC 8.1 9.5  HGB 11.8* 12.7*  HCT 36.2* 37.2*  PLT 147* 148*   BMET:   Recent Labs  12/19/14 0825 12/20/14 1123  NA 136 138  K 3.4* 3.9  CL 97* 100*  CO2 27 26  GLUCOSE 100* 104*  BUN 57* 41*  CREATININE 4.23* 3.79*  CALCIUM 9.6 9.0   Studies/Results: No results found.  Scheduled: . calcitRIOL  0.25 mcg Oral Daily  . diltiazem  180 mg Oral QHS  . lipase/protease/amylase  24,000 Units Oral TID WC  . loperamide  2 mg Oral BID  . metoprolol succinate  75 mg Oral Daily  . sevelamer carbonate  800 mg Oral TID WC  . sodium chloride  3 mL Intravenous Q12H   Assessment/Plan: #1 chronic kidney disease stage V/ESRD: Plan for HD today.  #2 severe aortic stenosis: Pending further recommendations from cardiology. Possible DC today per most recent note though will need to be set up with outpatient dialysis.   LOS: 11 days   Charlott [name] 12/21/2014,1:07 PM   I have seen and examined this patient and agree with plan as outlined by Dr. Posey Pronto.  [name] is now ESRD and will  require outpt HD arrangements to be made before discharge.  He is due to TAVR per Drs. Roxy Manns and [place].  He is not ready for discharge from a renal standpoint.  Will notify primary team once he has an outpt unit and a scheduled time.  Await final plans regarding TAVR. Alex Leahy A,MD 12/21/2014 1:37 PM

## 2014-12-21 NOTE — Progress Notes (Signed)
Physical Therapy Treatment Patient Details Name: [name] MRN: [id number] DOB: [birth year] Today's Date: 12/21/2014    History of Present Illness [name] is a 74 y.o.male history of afib, HTN, HOCM, stage 5 CKD, chronic diastolic Hf, aortic stenosis admitted with SOB. Weight is up to 180 lbs, was 176 lbs during 11/19/14 clinic visit. Notes several weeks or worsening LE edema, weight gain, and SOB. Denies any chest pain. Compliant with diuretics and low sodium diet. Cardiac cath 8/17; determining appropriateness of possible TAVR    PT Comments    In addition to ambulation, treatment today focused on appropriate activity level for pt to maintain while on  HD between now and TAVR. Pt verbalized understanding. Discussed trying to create a network of "helpers" for HD transport since his wife works but at this point he feels sure that he will be able to drive himself. Also discussed having a pair of larger size tennis shoes for when his feet are swollen as he reports that he really does not like ambulating in sock only. PT will continue to follow.    Follow Up Recommendations  Other (comment)     Equipment Recommendations  None recommended by PT    Recommendations for Other Services       Precautions / Restrictions Precautions Precaution Comments: DOE Restrictions Weight Bearing Restrictions: No    Mobility  Bed Mobility               General bed mobility comments: received in chair  Transfers Overall transfer level: Independent Equipment used: None             General transfer comment: independent though increased time when labored breathing  Ambulation/Gait Ambulation/Gait assistance: Supervision Ambulation Distance (Feet): 450 Feet Assistive device: None Gait Pattern/deviations: Staggering left;Wide base of support Gait velocity: 1.3 ft/sec Gait velocity interpretation: <1.8 ft/sec, indicative of risk for recurrent falls General Gait Details: no AD or  IV pole used, when pt begins to become dyspneic, he begins to stagger slightly with ambulation and widen BOS, no full LOB but he slows down and cued that this is a good time to take a seated rest break   Stairs            Wheelchair Mobility    Modified Rankin (Stroke Patients Only)       Balance Overall balance assessment: Needs assistance Sitting-balance support: Feet supported Sitting balance-Leahy Scale: Normal     Standing balance support: No upper extremity supported;During functional activity Standing balance-Leahy Scale: Good Standing balance comment: pt shows balance deficits when dyspneic, education given on being aware of this                    Cognition Arousal/Alertness: Awake/alert Behavior During Therapy: WFL for tasks assessed/performed Overall Cognitive Status: Within Functional Limits for tasks assessed                      Exercises General Exercises - Lower Extremity Mini-Sqauts: AROM;10 reps;Standing    General Comments General comments (skin integrity, edema, etc.): discussed activity level and appropriate exercises to perform at home between now and when he returns for TAVR. Pt would like to return to gym. Discussed level of activity that will be appropriate before that time.      Pertinent Vitals/Pain Pain Assessment: No/denies pain  HR up tp 117 bpm with ambulation, could not get accurate reading on pulse ox while with pt, noted extremities cold  Home Living                      Prior Function            PT Goals (current goals can now be found in the care plan section) Acute Rehab PT Goals Patient Stated Goal: wants to feel better after all this (cardiac workup and decision-making) PT Goal Formulation: With patient Time For Goal Achievement: 12/30/14 Potential to Achieve Goals: Good Progress towards PT goals: Progressing toward goals    Frequency  Min 3X/week    PT Plan Current plan remains  appropriate    Co-evaluation             End of Session   Activity Tolerance: Patient tolerated treatment well Patient left: in chair     Time: 9191-6606 PT Time Calculation (min) (ACUTE ONLY): 22 min  Charges:  $Gait Training: 8-22 mins                    G Codes:     Leighton Roach, PT  Acute Rehab Services  [phone]  Leighton Roach 12/21/2014, 2:39 PM

## 2014-12-21 NOTE — Progress Notes (Signed)
ANTICOAGULATION CONSULT NOTE - Follow Up Consult  Pharmacy Consult for Heparin  Indication: atrial fibrillation  Allergies  Allergen Reactions  . Amiodarone Other (See Comments)    Severely reduced DLCO  . Oxycodone Hcl Rash and Other (See Comments)    Rash and itching    Patient Measurements: Height: 5\' 8"  (172.7 cm) Weight: 163 lb (73.936 kg) IBW/kg (Calculated) : 68.4  Vital Signs: Temp: 98 F (36.7 C) (08/21 2008) Temp Source: Oral (08/21 2008) BP: 145/93 mmHg (08/21 2008) Pulse Rate: 80 (08/21 2008)  Labs:  Recent Labs  12/18/14 0250  12/18/14 0734 12/18/14 2020 12/19/14 0228 12/19/14 0825  12/20/14 0433 12/20/14 1123 12/20/14 1334 12/20/14 2303  HGB  --   < > 12.2* 12.0*  --   --   --  11.8*  --   --   --   HCT  --   --  37.3* 36.6*  --   --   --  36.2*  --   --   --   PLT  --   --  193 175  --   --   --  147*  --   --   --   LABPROT 16.9*  --   --   --  15.9*  --   --  16.6*  --   --   --   INR 1.36  --   --   --  1.25  --   --  1.33  --   --   --   HEPARINUNFRC  --   --   --   --   --   --   < > 0.91*  --  0.84* 0.57  CREATININE  --   --  4.26*  --   --  4.23*  --   --  3.79*  --   --   < > = values in this interval not displayed.  Estimated Creatinine Clearance: 16.5 mL/min (by C-G formula based on Cr of 3.79).   Assessment: Heparin level is therapeutic x 1 after rate decrease  Goal of Therapy:  Heparin level 0.3-0.7 units/ml Monitor platelets by anticoagulation protocol: Yes   Plan:  -Continue heparin at 950 units/hr -AM HL to confirm -Daily CBC/HL -Monitor for bleeding  [name] 12/21/2014,12:04 AM

## 2014-12-21 NOTE — Progress Notes (Signed)
Patient Name: [name] Tine Date of Encounter: 12/21/2014   SUBJECTIVE  Feels ok. Anxious to know about surgery. No CP or SOB. HD today.   CURRENT MEDS . calcitRIOL  0.25 mcg Oral Daily  . diltiazem  180 mg Oral QHS  . lipase/protease/amylase  24,000 Units Oral TID WC  . loperamide  2 mg Oral BID  . metoprolol succinate  75 mg Oral Daily  . sevelamer carbonate  800 mg Oral TID WC  . sodium chloride  3 mL Intravenous Q12H    OBJECTIVE  Filed Vitals:   12/20/14 1018 12/20/14 1526 12/20/14 2008 12/21/14 0400  BP: 137/84 151/93 145/93 144/92  Pulse: 99 74 80 73  Temp: 97.9 F (36.6 C) 97.7 F (36.5 C) 98 F (36.7 C) 98.1 F (36.7 C)  TempSrc: Oral Oral Oral Oral  Resp: 18 17 19 20   Height:      Weight:    171 lb 4.8 oz (77.701 kg)  SpO2: 98% 94% 99% 100%    Intake/Output Summary (Last 24 hours) at 12/21/14 1031 Last data filed at 12/21/14 0900  Gross per 24 hour  Intake    360 ml  Output   1000 ml  Net   -640 ml   Filed Weights   12/19/14 1913 12/20/14 0339 12/21/14 0400  Weight: 167 lb 5.3 oz (75.9 kg) 163 lb (73.936 kg) 171 lb 4.8 oz (77.701 kg)    PHYSICAL EXAM  General: Pleasant, NAD. Neuro: Alert and oriented X 3. Moves all extremities spontaneously. Psych: Normal affect. HEENT:  Normal  Neck: Supple without bruits or JVD. Lungs:  Resp regular and unlabored, CTA. Heart: RRR no s3, s4. systolic murmur. Abdomen: Soft, non-tender, non-distended, BS + x 4.  Extremities: No clubbing, cyanosis. Trace ankle edema. DP/PT/Radials 2+ and equal bilaterally.  Accessory Clinical Findings  CBC  Recent Labs  12/20/14 0433 12/21/14 0805  WBC 8.1 9.5  HGB 11.8* 12.7*  HCT 36.2* 37.2*  MCV 108.1* 105.1*  PLT 147* 782*   Basic Metabolic Panel  Recent Labs  12/19/14 0825 12/20/14 1123  NA 136 138  K 3.4* 3.9  CL 97* 100*  CO2 27 26  GLUCOSE 100* 104*  BUN 57* 41*  CREATININE 4.23* 3.79*  CALCIUM 9.6 9.0  PHOS  --  4.5   Liver Function  Tests  Recent Labs  12/20/14 1123  ALBUMIN 3.4*    TELE  afib  Radiology/Studies  ASSESSMENT AND PLAN   1 Severe AS-ongoing evaluation for TAVR. Await follow-up plans per Dr. Roxy Manns and Dr. Burt Knack. 2 Permanent atrial fibrillation-continue cardizem CD 180mg  and toprol XL 75mg  for rate control; possible LAA thrombus on CT; continue IV heparin; resume coumadin once all procedures complete. 3 ESRD-dialysis initiated. HD today. 4 hypertension-blood pressure controlled. 5 acute on chronic diastolic congestive heart failure-diuresed 485ml/13.5L. Euvolemic. Not on any diuretics.   Signed, Bhagat,Bhavinkumar PA-C Pager [phone]  I have seen and examined the patient along with Bhagat,Bhavinkumar PA-C.  I have reviewed the chart, notes and new data.  I agree with PA's note.  Key new complaints: easily dyspneic walking a few steps, sometimes even turning over in chair/bed Key examination changes: no rales. Mid-late peaking AS murmur Key new findings / data: CT angio of pelvis and chest shows conventional femoral approach for TAVR is not a good option. Considering transapical TAVR, tentatively scheduled September 2  PLAN: Reevaluate after hemodialysis for DC home in anticipation of TAVR in 11 days. TEE at time of  TAVR for possible LA appendage thrombus. I don't think this will have a big impact on treatment plan.  Sanda Klein, MD, [place] [phone] 12/21/2014, 11:38 AM

## 2014-12-21 NOTE — Care Management Important Message (Signed)
Important Message  Patient Details  Name: [name] MRN: [id number] Date of Birth: 1941/02/29   Medicare Important Message Given:  Yes-fourth notification given    Dawayne Patricia, RN 12/21/2014, 10:28 AM

## 2014-12-21 NOTE — Progress Notes (Addendum)
ANTICOAGULATION CONSULT NOTE - Follow Up Consult  Pharmacy Consult for Heparin  Indication: atrial fibrillation  Allergies  Allergen Reactions  . Amiodarone Other (See Comments)    Severely reduced DLCO  . Oxycodone Hcl Rash and Other (See Comments)    Rash and itching    Patient Measurements: Height: 5\' 8"  (172.7 cm) Weight: 171 lb 4.8 oz (77.701 kg) IBW/kg (Calculated) : 68.4  Vital Signs: Temp: 98.1 F (36.7 C) (08/22 0400) Temp Source: Oral (08/22 0400) BP: 144/92 mmHg (08/22 0400) Pulse Rate: 73 (08/22 0400)  Labs:  Recent Labs  12/18/14 2020 12/19/14 0228 12/19/14 0825  12/20/14 0433 12/20/14 1123 12/20/14 1334 12/20/14 2303 12/21/14 0744 12/21/14 0805  HGB 12.0*  --   --   --  11.8*  --   --   --   --  12.7*  HCT 36.6*  --   --   --  36.2*  --   --   --   --  37.2*  PLT 175  --   --   --  147*  --   --   --   --  148*  LABPROT  --  15.9*  --   --  16.6*  --   --   --  15.3*  --   INR  --  1.25  --   --  1.33  --   --   --  1.19  --   HEPARINUNFRC  --   --   --   < > 0.91*  --  0.84* 0.57 0.32  --   CREATININE  --   --  4.23*  --   --  3.79*  --   --   --   --   < > = values in this interval not displayed.  Estimated Creatinine Clearance: 16.5 mL/min (by C-G formula based on Cr of 3.79).   Assessment: 74 year old male s/p cath 8/19 with hx of permanent afib on coumadin prior admission. Patient now undergoing TAVR evaluation. Continue to hold warfarin until procedures are finished. 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  HL therapeutic x2 on 950 units/h but drifting towards bottom of range (0.32) - however, RN says heparin was paused ~50 minutes this AM due to access issues and then restarted a few minutes prior to HL being drawn. Will continue at current rate and follow daily HLs. Hg moderately low 12.7 (up), plt low stable 148. No bleed issues per RN   Goal of Therapy:  Heparin level 0.3-0.7 units/ml Monitor platelets by anticoagulation protocol: Yes   Plan:   Continue heparin @950  units/h Daily HL/CBC, mon s/sx bleeding TAVR intervention soon?  Elicia Lamp, PharmD Clinical Pharmacist Pager [phone] 12/21/2014 9:54 AM

## 2014-12-21 NOTE — Progress Notes (Signed)
Utilization review completed.  

## 2014-12-22 ENCOUNTER — Encounter: Payer: Self-pay | Admitting: Surgery

## 2014-12-22 ENCOUNTER — Other Ambulatory Visit: Payer: Self-pay | Admitting: *Deleted

## 2014-12-22 DIAGNOSIS — I350 Nonrheumatic aortic (valve) stenosis: Secondary | ICD-10-CM

## 2014-12-22 DIAGNOSIS — I482 Chronic atrial fibrillation: Secondary | ICD-10-CM | POA: Diagnosis not present

## 2014-12-22 DIAGNOSIS — I5033 Acute on chronic diastolic (congestive) heart failure: Secondary | ICD-10-CM | POA: Diagnosis not present

## 2014-12-22 LAB — RENAL FUNCTION PANEL
Albumin: 3.3 g/dL — ABNORMAL LOW (ref 3.5–5.0)
Anion gap: 10 (ref 5–15)
BUN: 25 mg/dL — AB (ref 6–20)
CALCIUM: 8.7 mg/dL — AB (ref 8.9–10.3)
CHLORIDE: 95 mmol/L — AB (ref 101–111)
CO2: 29 mmol/L (ref 22–32)
CREATININE: 3.1 mg/dL — AB (ref 0.61–1.24)
GFR calc non Af Amer: 18 mL/min — ABNORMAL LOW (ref >60)
GFR, EST AFRICAN AMERICAN: 21 mL/min — AB (ref >60)
GLUCOSE: 95 mg/dL (ref 65–99)
Phosphorus: 4 mg/dL (ref 2.5–4.6)
Potassium: 3.7 mmol/L (ref 3.5–5.1)
SODIUM: 134 mmol/L — AB (ref 135–145)

## 2014-12-22 LAB — CBC
HCT: 36.1 % — ABNORMAL LOW (ref 39.0–52.0)
Hemoglobin: 11.8 g/dL — ABNORMAL LOW (ref 13.0–17.0)
MCH: 35.1 pg — ABNORMAL HIGH (ref 26.0–34.0)
MCHC: 32.7 g/dL (ref 30.0–36.0)
MCV: 107.4 fL — ABNORMAL HIGH (ref 78.0–100.0)
PLATELETS: 132 10*3/uL — AB (ref 150–400)
RBC: 3.36 MIL/uL — ABNORMAL LOW (ref 4.22–5.81)
RDW: 18.4 % — AB (ref 11.5–15.5)
WBC: 7.2 10*3/uL (ref 4.0–10.5)

## 2014-12-22 LAB — PROTIME-INR
INR: 1.11 (ref 0.00–1.49)
PROTHROMBIN TIME: 14.5 s (ref 11.6–15.2)

## 2014-12-22 LAB — HEPARIN LEVEL (UNFRACTIONATED)
HEPARIN UNFRACTIONATED: 0.14 [IU]/mL — AB (ref 0.30–0.70)
HEPARIN UNFRACTIONATED: 0.27 [IU]/mL — AB (ref 0.30–0.70)

## 2014-12-22 MED ORDER — WARFARIN - PHARMACIST DOSING INPATIENT: Freq: Every day | Status: DC

## 2014-12-22 MED ORDER — METOPROLOL SUCCINATE ER 50 MG PO TB24: 50.0000 mg | ORAL_TABLET | Freq: Every day | ORAL | Status: DC

## 2014-12-22 MED ORDER — WARFARIN SODIUM 5 MG PO TABS
5.0000 mg | ORAL_TABLET | Freq: Once | ORAL | Status: AC
  Administered 2014-12-22: 5 mg via ORAL
  Filled 2014-12-22: qty 1

## 2014-12-22 MED ORDER — METOPROLOL SUCCINATE ER 25 MG PO TB24: 25.0000 mg | ORAL_TABLET | Freq: Every day | ORAL | Status: DC

## 2014-12-22 NOTE — Progress Notes (Addendum)
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 Amiodarone Other (See Comments)    Severely reduced DLCO  . Oxycodone Hcl Rash and Other (See Comments)    Rash and itching    Patient Measurements: Height: 5\' 8"  (172.7 cm) Weight: 169 lb 4.8 oz (76.794 kg) IBW/kg (Calculated) : 68.4  Vital Signs: Temp: 98.6 F (37 C) (08/23 0455) Temp Source: Oral (08/23 0455) BP: 133/80 mmHg (08/23 0455) Pulse Rate: 72 (08/23 0455)  Labs:  Recent Labs  12/20/14 0433 12/20/14 1123  12/20/14 2303 12/21/14 0744 12/21/14 0805 12/22/14 0448 12/22/14 0946  HGB 11.8*  --   --   --   --  12.7* 11.8*  --   HCT 36.2*  --   --   --   --  37.2* 36.1*  --   PLT 147*  --   --   --   --  148* 132*  --   LABPROT 16.6*  --   --   --  15.3*  --  14.5  --   INR 1.33  --   --   --  1.19  --  1.11  --   HEPARINUNFRC 0.91*  --   < > 0.57 0.32  --  0.14*  --   CREATININE  --  3.79*  --   --   --   --   --  3.10*  < > = values in this interval not displayed.  Estimated Creatinine Clearance: 20.2 mL/min (by C-G formula based on Cr of 3.1).   Assessment: 74 year old male s/p cath 8/19 with hx of permanent afib, on coumadin prior admission. 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 Patient now scheduled for TAVR on 01/05/15 and to restart warfarin today until procedure. 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 Pharmacy consulted to dose heparin bridge + warfarin.[name]  HL pending after increase to 1100 units/h this AM. Hg 11.8 stable, plt down to 132, INR 1.11. No bleed/IV line issues per RN  Per Dr. Tammi Klippel, patient may decide to leave today but renal hasn't cleared for d/c yet.  PTA warfarin dose: 2.5mg  daily except 5mg  on Thurs PTA   Goal of Therapy:  Heparin level 0.3-0.7 units/ml Monitor platelets by anticoagulation protocol: Yes   Plan:  Heparin at 1100 units/h 8h HL, Daily  HL/CBC/INR Mon s/sx bleeding Warfarin 5mg  x 1 dose tonight - would restart patient's home dose if decides to leave TAVR intervention 01/05/15  Elicia Lamp, PharmD Clinical Pharmacist Pager [phone] 12/22/2014 2:05 PM  Addendum: Heparin level back and just below the low end of desired goal range at 0.27.  No noted bleeding and Warfarin started this evening.  Will plan to increase rate slightly and recheck with AM labs.  Plan: Increase heparin rate to 1200 units/hr.  Follow up AM labs and adjust  Rober Minion, PharmD., MS Clinical Pharmacist Pager:  [phone] Thank you for allowing pharmacy to be part of this patients care team.

## 2014-12-22 NOTE — Progress Notes (Signed)
TAVR Team Note: Mr [name] is doing well. Tolerating hemodialysis. He is eager to return home. Will arrange follow-up as an outpatient prior to his scheduled TAVR surgery 01/05/15. He has poor vascular access and likely will perform TAVR via a transapical approach. He understands plan.  [name] 12/22/2014 9:11 AM

## 2014-12-22 NOTE — Progress Notes (Unsigned)
Patient ID: [name], male   DOB: January 14, 1941, 74 y.o.   MRN: [id number]  Expand All Collapse All     [facility]    CARDIOTHORACIC SURGERY CONSULTATION REPORT  Referring Provider is [name], MD PCP is [name], MD  Chief Complaint  Patient presents with  . Shortness of Breath  . Leg Swelling Severe aortic stenosis    HPI:   The patient is a 74 year old gentleman with a history of hypertension, chronic persistent atrial fibrillation, chronic diastolic congestive heart failure and stage V chronic kidney disease just starting HD, as well as severe aortic stenosis. Transthoracic echocardiograms have previously documented the presence of moderate aortic stenosis with preserved left ventricular systolic function. Echocardiogram performed 06/13/2014 demonstrated left ventricular ejection fraction estimated at 55-60% with a mean transvalvular gradient across the aortic valve estimated at 27 mmHg. Over the past 6 months he has developed worsening problems with exertional shortness of breath, and he has been hospitalized now a total of 3 times with acute exacerbation of shortness of breath both with minimal activity and at rest. Follow-up echocardiogram performed 12/03/2014 demonstrated peak velocity across the aortic valve measured in excess of 4 m/s corresponding to a mean transvalvular gradient of 44 mmHg and a DI of 0.18. Left ventricular ejection fraction remained preserved at 55%. There was severe mitral annular calcification and moderate mitral regurgitation. The patient was seen in follow-up in the office by Dr. Radford Pax on 12/09/2014 and referred for elective surgical consultation. However, the patient presented acutely to the hospital on 12/10/2014 with another acute exacerbation of resting shortness of breath. He has improved symptomatically with HD which was started during this admission. He underwent cardiac cath showing nonobstructive  coronary disease. He completed his TAVR workup as noted below.  Past Medical History  Diagnosis Date  . Atrial fibrillation, persistent     a. cardioversion 11/21/10 . b. 05/2013 - experiencing more SOB. 24-hr holter showed avg HR 49, max 82, min 38bpm. ETT showed baseline junctional bradycardia, HR incr only to 90bpm with drop in BP. Saw EP - amiodarone reduced and eventually d/c'd with improvement in dyspnea.  . Hypertension   . Warfarin anticoagulation   . CKD (chronic kidney disease), stage V     a. Followed by Dr. Jimmy Footman. b. IV fistula placed 04/17/14.  [name] Aortic valve stenosis   . Orthostatic hypotension   . Diastolic dysfunction   . Arthritis   . Self-catheterizes urinary bladder   . Junctional bradycardia   . NSVT (nonsustained ventricular tachycardia)     a. By holter 01/2014.  [name] Shortness of breath dyspnea   . PVD (peripheral vascular disease)   . Cardiomyopathy   . RTA (renal tubular acidosis)   . Hypomagnesemia   . Bladder cancer     a. 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  [name] A-V fistula   . CAD (coronary artery disease) 12/16/14    a. cath - nonobstrucive CAD 30% pro RCA; 25% dis RCA; 40T pro Lcx    Past Surgical History  Procedure Laterality Date  . Cardioversion  11/21/2010  . Cystectomy    . Cholecystectomy    . Cataract extraction w/ intraocular lens implant, bilateral    . Colonoscopy    . Av fistula placement Left 04/17/2014    Procedure: ARTERIOVENOUS (AV) FISTULA CREATION- LEFT UPPER ARM ; Surgeon: Mal [name], MD; Location: [facility]; Service: Vascular; Laterality: Left;  . Bladder surgery    .  Cardiac catheterization N/A 12/16/2014    Procedure: Right/Left Heart Cath and Coronary Angiography; Surgeon: [name], MD; Location: [facility]; Service: Cardiovascular; Laterality: N/A;    Family  History  Problem Relation Age of Onset  . CVA Mother   . Heart attack Father   . Heart disease Father   . Arrhythmia Father   . Alzheimer's disease Sister   . Multiple sclerosis Brother   . Alcohol abuse Father     Social History   Social History  . Marital Status: Married    Spouse Name: N/A  . Number of Children: N/A  . Years of Education: N/A   Occupational History  . Not on file.   Social History Main Topics  . Smoking status: Former Smoker -- 2.00 packs/day for 33 years    Types: Cigarettes    Quit date: 05/02/1983  . Smokeless tobacco: Never Used  . Alcohol Use: No  . Drug Use: No  . Sexual Activity: Not on file   Other Topics Concern  . Not on file   Social History Narrative    Current Facility-Administered Medications  Medication Dose Route Frequency Provider Last Rate Last Dose  . 0.9 % sodium chloride infusion 250 mL Intravenous PRN [name], MD    . 0.9 % sodium chloride infusion 100 mL Intravenous PRN Estanislado Emms, MD    . 0.9 % sodium chloride infusion 100 mL Intravenous PRN Estanislado Emms, MD    . acetaminophen (TYLENOL) tablet 650 mg 650 mg Oral Q4H PRN Arnoldo Lenis, MD  650 mg at 12/21/14 0949  . alteplase (CATHFLO ACTIVASE) injection 2 mg 2 mg Intracatheter Once PRN Estanislado Emms, MD    . calcitRIOL (ROCALTROL) capsule 0.25 mcg 0.25 mcg Oral Daily Arnoldo Lenis, MD  0.25 mcg at 12/22/14 [phone]  . diltiazem (CARDIZEM CD) 24 hr capsule 180 mg 180 mg Oral QHS Arnoldo Lenis, MD  180 mg at 12/21/14 2200  . feeding supplement (NEPRO CARB STEADY) liquid 237 mL 237 mL Oral PRN Estanislado Emms, MD    . guaiFENesin-dextromethorphan [facility] DM) 100-10 MG/5ML syrup 5 mL 5 mL Oral Q4H PRN Arnoldo Lenis, MD  5 mL at 12/13/14 1010  . heparin  ADULT infusion 100 units/mL (25000 units/250 mL) 1,100 Units/hr Intravenous Continuous Erenest Blank, RPH 11 mL/hr at 12/22/14 0630 1,100 Units/hr at 12/22/14 0630  . heparin injection 1,000 Units 1,000 Units Dialysis PRN Estanislado Emms, MD    . heparin injection 1,600 Units 20 Units/kg Dialysis PRN Estanislado Emms, MD    . lidocaine (PF) (XYLOCAINE) 1 % injection 5 mL 5 mL Intradermal PRN Estanislado Emms, MD    . lidocaine-prilocaine (EMLA) cream 1 application 1 application Topical PRN Estanislado Emms, MD  1 application at 36/64/40 1351  . lipase/protease/amylase (CREON) capsule 24,000 Units 24,000 Units Oral TID WC Arnoldo Lenis, MD  24,000 Units at 12/22/14 1200  . loperamide (IMODIUM) capsule 2 mg 2 mg Oral PRN Arnoldo Lenis, MD  2 mg at 12/20/14 1705  . loperamide (IMODIUM) capsule 2 mg 2 mg Oral BID Bhavinkumar Bhagat, PA  2 mg at 12/22/14 0939  . metoprolol succinate (TOPROL-XL) 24 hr tablet 75 mg 75 mg Oral Daily Bhavinkumar Bhagat, PA  75 mg at 12/22/14 0939  . ondansetron (ZOFRAN) injection 4 mg 4 mg Intravenous Q6H PRN Arnoldo Lenis, MD    . pentafluoroprop-tetrafluoroeth Landry Dyke) aerosol 1 application 1 application Topical  PRN Estanislado Emms, MD    . sevelamer carbonate (RENVELA) tablet 800 mg 800 mg Oral TID WC Arnoldo Lenis, MD  800 mg at 12/22/14 1100  . sodium chloride 0.9 % injection 3 mL 3 mL Intravenous Q12H [name], MD  3 mL at 12/20/14 2112  . sodium chloride 0.9 % injection 3 mL 3 mL Intravenous PRN [name], MD    . warfarin (COUMADIN) tablet 5 mg 5 mg Oral ONCE-1800 Romona Curls, [facility]    . Warfarin - Pharmacist Dosing Inpatient  Does not apply [place], [facility]      Allergies  Allergen Reactions  . Amiodarone Other (See Comments)    Severely reduced DLCO  .  Oxycodone Hcl Rash and Other (See Comments)    Rash and itching      Review of Systems:  General:decreased appetite, decreased energy, + weight gain, no weight loss, no fever Cardiac:no chest pain with exertion, no chest pain at rest, + SOB with exertion, + occasional resting SOB, no PND, + orthopnea, + palpitations, + arrhythmia, + atrial fibrillation, + LE edema, + dizzy spells, NO syncope Respiratory:+ shortness of breath, no home oxygen, no productive cough, + chronic dry cough, no bronchitis, no wheezing, no hemoptysis, no asthma, no pain with inspiration or cough, no sleep apnea, no CPAP at night ZO:XWRU difficulty swallowing, no reflux, no frequent heartburn, no hiatal hernia, no abdominal pain, no constipation, + chronic diarrhea, no hematochezia, no hematemesis, no melena EA:[id number] for urine s/p radical cystectomy, no dysuria, + frequency, no urinary tract infection, no hematuria, *no enlarged prostate, no kidney stones, + kidney disease Vascular:no pain suggestive of claudication, no pain in feet, no leg cramps, no varicose veins, no DVT, + non-healing foot ulcer, right lower leg Neuro:no stroke, no TIA's, no seizures, no headaches, no temporary blindness one eye, no slurred speech, no peripheral neuropathy, no chronic pain, no instability of gait, no memory/cognitive dysfunction Musculoskeletal:mild arthritis - primarily involving the hands, no joint swelling, no myalgias, no difficulty walking, normal mobility  Skin:no rash, no itching, no skin infections, + pressure sores or ulcerations, + chronic skin changes from  warfarin Psych:no anxiety, no depression, no nervousness, no unusual recent stress Eyes:+ blurry vision, no floaters, + recent vision changes, + wears glasses or contacts ENT:no hearing loss, edentulous w/ full set dentures, last saw dentist several years ago Hematologic:+ easy bruising, + abnormal bleeding, no clotting disorder, no frequent epistaxis, no complications w/ long term warfarin Endocrine:no diabetes, does not check CBG's at home     Physical Exam:  BP 133/80 mmHg  Pulse 72  Temp(Src) 98.6 F (37 C) (Oral)  Resp 18  Ht 5\' 8"  (1.727 m)  Wt 169 lb 4.8 oz (76.794 kg)  BMI 25.75 kg/m2  SpO2 98% General:chronically ill-appearing gentleman in no distress HEENT:Unremarkable , NCAT, PERLA, EOMI, oropharynx clear Neck:no JVD, no bruits, no adenopathy or thyromegaly Chest:clear to auscultation, symmetrical breath sounds, no wheezes, no rhonchi  CV:RRR, grade III/VI harsh crescendo/decrescendo murmur heard throughout the precordium,  no diastolic murmur Abdomen:soft, non-tender, no masses or organomegaly Extremities:warm, well-perfused, pulses diminished in feet, mild bilateral LE edema, fistula in left upper arm with extensive ecchymosis in right groin and thigh, left arm. Rectal/GUDeferred Neuro:Grossly non-focal and symmetrical throughout Skin:Clean and dry, no rashes, no breakdown   Diagnostic  Tests:     [facility]            [address]  [place], [place] [address]              [phone]  ------------------------------------------------------------------- Echocardiography  Patient:  [name], [name] MR #:    [id number] Study Date: 12/03/2014 Gender:   M Age:    6 Height:   172.7 cm Weight:   78.5 kg BSA:    1.95 m^2 Pt. Status: Room:  ATTENDING  Fransico Him, MD ORDERING   Fransico Him, MD REFERRING  Fransico Him, MD SONOGRAPHER Charlann Noss, RDCS PERFORMING  [facility], Outpatient  cc:  ------------------------------------------------------------------- LV EF: 55%  ------------------------------------------------------------------- Indications:   Cardiomyopathy.  ------------------------------------------------------------------- History:  PMH: Acquired from the patient and from the patient&'s chart. Atrial fibrillation. Cardiomyopathy. Aortic valve disease. Chronic kidney disease. Risk factors: Hypertension.  ------------------------------------------------------------------- Study Conclusions  - Left ventricle: There was moderate concentric hypertrophy. The estimated ejection fraction was 55%. - Aortic valve: There was severe stenosis. There was mild regurgitation. - Mitral valve: Severely calcified annulus. There was mild to moderate regurgitation. Valve area by continuity equation (using LVOT flow): 1.25 cm^2. - Left atrium: The atrium was severely dilated. - Right atrium: The atrium was mildly dilated. - Atrial septum: No defect or patent foramen ovale was identified.  ------------------------------------------------------------------- Labs, prior tests, procedures, and surgery: Echocardiography (February 2016).  Echocardiography. M-mode, complete 2D, spectral Doppler, and color Doppler. Birthdate: Patient birthdate: 05/06/1940.  Age: Patient is 74 yr old. Sex: Gender: male.  BMI: 26.3 kg/m^2. Blood pressure:   145/86 Patient status: Outpatient. Study date: Study date: 12/03/2014. Study time: 09:54 AM. Location: [facility]  -------------------------------------------------------------------  ------------------------------------------------------------------- Left ventricle: There was moderate concentric hypertrophy. The estimated ejection fraction was 55%.  ------------------------------------------------------------------- Aortic valve:  Moderately calcified leaflets. Doppler:  There was severe stenosis.  There was mild regurgitation.  VTI ratio of LVOT to aortic valve: 0.19. Valve area (VTI): 0.47 cm^2. Indexed valve area (VTI): 0.24 cm^2/m^2. Mean velocity ratio of LVOT to aortic valve: 0.18. Valve area (Vmean): 0.45 cm^2. Indexed valve area (Vmean): 0.23 cm^2/m^2.  Mean gradient (S): 44 mm Hg. Peak gradient (S): 87 mm Hg.  ------------------------------------------------------------------- Aorta: Ascending aorta: The ascending aorta was mildly dilated.  ------------------------------------------------------------------- Mitral valve:  Severely calcified annulus. Doppler: There was mild to moderate regurgitation.  Valve area by continuity equation (using LVOT flow): 1.25 cm^2. Indexed valve area by continuity equation (using LVOT flow): 0.64 cm^2/m^2.  Mean gradient (D): 4 mm Hg.  ------------------------------------------------------------------- Left atrium: The atrium was severely dilated.  ------------------------------------------------------------------- Atrial septum: No defect or patent foramen ovale was identified.  ------------------------------------------------------------------- Right ventricle: The cavity size was normal. Wall thickness was normal. Systolic function was  normal.  ------------------------------------------------------------------- Pulmonic valve:  Doppler: There was mild regurgitation.  ------------------------------------------------------------------- Tricuspid valve:  Doppler: There was mild regurgitation.  ------------------------------------------------------------------- Right atrium: The atrium was mildly dilated.  ------------------------------------------------------------------- Pericardium: The pericardium was normal in appearance.  ------------------------------------------------------------------- Measurements  Left ventricle             Value       Reference LV ID, ED, PLAX chordal        51  mm     43 - 52 LV ID, ES, PLAX chordal        32  mm     23 - 38 LV fx shortening, PLAX chordal     37  %      >=29 LV PW thickness, ED          14  mm     --------- IVS/LV PW ratio,  ED          0.93       <=1.3 Stroke volume, 2D           51  ml     --------- Stroke volume/bsa, 2D         26  ml/m^2   ---------  Ventricular septum           Value       Reference IVS thickness, ED           13  mm     ---------  LVOT                  Value       Reference LVOT ID, S               18  mm     --------- LVOT area               2.54 cm^2    --------- LVOT ID                18  mm     --------- LVOT mean velocity, S         52.4 cm/s    --------- LVOT VTI, S              20.2 cm     --------- Stroke volume (SV), LVOT DP      51.4 ml     --------- Cardiac output (Qs), LVOT DP      3.9  L/min    --------- Cardiac index (Qs/bsa), LVOT      2   L/(min-m^2) --------- DP Stroke index (SV/bsa), LVOT DP      26.3 ml/m^2   ---------  Aortic valve              Value       Reference Aortic valve peak velocity, S     466  cm/s    --------- Aortic valve mean velocity, S     298  cm/s    --------- Aortic valve VTI, S          109  cm     --------- Aortic mean gradient, S        44  mm Hg    --------- Aortic peak gradient, S        87  mm Hg    --------- VTI ratio, LVOT/AV           0.19       --------- Aortic valve area, VTI         0.47 cm^2    --------- Aortic valve area/bsa, VTI       0.24 cm^2/m^2  --------- Velocity ratio, mean, LVOT/AV     0.18       --------- Aortic valve area, mean        0.45 cm^2    --------- velocity Aortic valve area/bsa, mean      0.23 cm^2/m^2  --------- velocity Aortic regurg pressure         489  ms     --------- half-time  Aorta                 Value       Reference Aortic root ID, ED           39  mm     ---------  Left atrium  Value       Reference LA ID, A-P, ES             56  mm     --------- LA ID/bsa, A-P         (H)   2.87 cm/m^2   <=2.2 LA volume, S              238  ml     --------- LA volume/bsa, S            121.8 ml/m^2   --------- LA volume, ES, 1-p A4C         189  ml     --------- LA volume/bsa, ES, 1-p A4C       96.7 ml/m^2   --------- LA volume, ES, 1-p A2C         282  ml     --------- LA volume/bsa, ES, 1-p A2C       144.3 ml/m^2   ---------  Mitral valve              Value       Reference Mitral mean velocity, D        91.2 cm/s    --------- Mitral mean gradient, D        4   mm Hg    --------- Mitral  valve area, LVOT        1.25 cm^2    --------- continuity Mitral valve area/bsa, LVOT      0.64 cm^2/m^2  --------- continuity Mitral annulus VTI, D         41.1 cm     ---------  Systemic veins             Value       Reference Estimated CVP             3   mm Hg    ---------  Right ventricle            Value       Reference RV s&', lateral, S           8.38 cm/s    ---------  Legend: (L) and (H) mark values outside specified reference range.  ------------------------------------------------------------------- Prepared and Electronically Authenticated by  Jenkins Rouge, M.D. 2016-08-04T11:00:22   Cardiac Cath:  Conclusion     Prox RCA lesion, 30% stenosed.  Dist RCA lesion, 25% stenosed.  Prox Cx lesion, 40% stenosed.  Assessment: 1. Calcified but nonobstructive coronary artery disease 2. 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 Dense calcification of the aortic valve annulus extending into the mitral valve annulus, and at least moderate calcification of the ascending aorta.  Plan: Continued evaluation by the Multidisciplinary Valve Team for TAVR versus conventional AVR     Coronary Findings    Dominance: Right   Left Main  The vessel is angiographically normal.     Left Anterior Descending  The vessel exhibits minimal luminal irregularities.     Left Circumflex   . Prox Cx lesion, 40% stenosed. calcified .     Right Coronary Artery  There is mild the vessel.   . Prox RCA lesion, 30% stenosed. diffuse .   [name] Dist RCA lesion, 25% stenosed.      Left Heart    Mitral Valve The annulus is calcified. There is severe mitral annular calcification with a dense bar of calcium visualized from the aortic valve annulus to the mitral valve annulus   Aortic Valve The  aortic valve is calcified. There is restricted  aortic valve motion.   Aorta There is heavy calcification of the ascending aorta    Coronary Diagrams    Diagnostic Diagram            Implants    Name ID Temporary Type Supply   No information to display    Hemo Data       Most Recent Value   Fick Cardiac Output  6.78 L/min   Fick Cardiac Output Index  3.51 (L/min)/BSA   RA A Wave  -99 mmHg   RA V Wave  7 mmHg   RA Mean  5 mmHg   RV Systolic Pressure  60 mmHg   RV Diastolic Pressure  1 mmHg   RV EDP  9 mmHg   PA Systolic Pressure  50 mmHg   PA Diastolic Pressure  22 mmHg   PA Mean  34 mmHg   PW A Wave  -99 mmHg   PW V Wave  21 mmHg   PW Mean  19 mmHg   AO Systolic Pressure  329 mmHg   AO Diastolic Pressure  74 mmHg   AO Mean  100 mmHg   QP/QS  1   TPVR Index  9.68 HRUI   TSVR Index  29.04 HRUI   PVR SVR Ratio  0.15   TPVR/TSVR Ratio  0.33    Order-Level Documents:    There are no order-level documents.    Encounter-Level Documents - 12/10/14:      Scan on 12/16/2014 3:15 PM by Provider Default, MDScan on 12/16/2014 3:15 PM by Provider Default, MD     Scan on 12/22/2014 9:07 AM by Provider Default, MDScan on 12/22/2014 9:07 AM by Provider Default, MD     Electronic signature on 12/10/2014 7:37 PM     Electronic signature on 12/10/2014 7:37 PM    Signed    Electronically signed by [name], MD on 12/16/14 at 1526 EDT   ADDENDUM REPORT: 12/18/2014 18:37  CLINICAL DATA: Aortic stenosis  EXAM: Cardiac TAVR CT  TECHNIQUE: The patient was scanned on a Philips 256 scanner. A 120 kV retrospective scan was triggered in the descending thoracic aorta at 111 HU's. Gantry rotation speed was 270 msecs and collimation was .9 mm. 5 mg of iv Metoprolol and no nitro were given. The 3D data set was reconstructed in 5% intervals of the R-R cycle. Systolic and diastolic phases were analyzed on a dedicated work station using MPR, MIP and VRT modes. The patient received 80  cc of contrast.  FINDINGS: Aortic Valve: Severe leaflet calcification with severely restricted leaflet opening. There is significant asymmetric calcification under the non-coronary leaflet extending into the LVOT that continues into mitral annulus.  Aorta: Normal caliber. Moderate diffuse calcifications in the entire thoracic aorta. No dissection.  Sinotubular Junction: 31 x 29 mm  Ascending Thoracic Aorta: 36 x 35 mm  Aortic Arch: 27 x 27 mm  Descending Thoracic Aorta: 30 x 30 mm  Sinus of Valsalva Measurements:  Non-coronary: 32 mm  Right -coronary: 33 mm  Left -coronary: 36 mm  Coronary Artery Height above Annulus:  Left Main: 11 mm  Right Coronary: 12 mm  Virtual Basal Annulus Measurements:  Maximum/Minimum Diameter: 30 x 23 mm  Perimeter: 97 mm  Area: 560 mm2  Optimum Fluoroscopic Angle for Delivery: LAO 11 CAU 7  Other findings: Dilated pulmonary artery (41 x 37 mm) suggestive of pulmonary hypertension.  Normal pulmonary vein drainage.  Moderate concentric LVH.  Severely dilated left atrium.  There is a filling defect in the left atrial appendage that might represent incomplete contrast penetration vs a small thrombus.  IMPRESSION: 1. Severe leaflet calcification with severely restricted leaflet opening and annular measurement suitable for delivery of 29 mm Edward SAPIEN 3 valve.  There is significant asymmetric calcification located under the non-coronary leaflet extending into the LVOT.  2. Sufficient coronary to annulus distance.  3. Optimum Fluoroscopic Angle for Delivery: LAO 11 CAU 7  4. There is a filling defect in the left atrial appendage that might represent incomplete contrast penetration vs a small thrombus.  Ena Dawley   Electronically Signed  By: Ena Dawley  On: 12/18/2014 18:37      Study Result     EXAM: OVER-READ INTERPRETATION CT CHEST  The  following report is an over-read performed by radiologist Dr. Rebekah Chesterfield [facility] on 12/18/2014. This over-read does not include interpretation of cardiac or coronary anatomy or pathology. The coronary calcium score/coronary CTA interpretation by the cardiologist is attached.  COMPARISON: Chest CT 06/11/2014.  FINDINGS: Extracardiac findings better demonstrated on contemporaneously obtained CTA of the chest, abdomen and pelvis 12/18/2014.  IMPRESSION: Please see separate dictation for full description of extracardiac findings on contemporaneously obtained CTA of the chest, abdomen and pelvis 12/18/2014.  Electronically Signed: By: Vinnie Langton M.D. On: 12/18/2014 13:08    CLINICAL DATA: 74 year old male with history of severe aortic stenosis. Preprocedural study prior to potential transcatheter aortic valve replacement (TAVR) procedure.  EXAM: CT ANGIOGRAPHY CHEST WITH CONTRAST  TECHNIQUE: Multidetector CT imaging of the chest was performed using the standard protocol during bolus administration of intravenous contrast. Multiplanar CT image reconstructions and MIPs were obtained to evaluate the vascular anatomy.  CONTRAST: 41mL OMNIPAQUE IOHEXOL 350 MG/ML SOLN  COMPARISON: Chest CT 06/11/2014. CT the abdomen and pelvis 05/17/2012.  FINDINGS: CTA CHEST FINDINGS  Mediastinum/Lymph Nodes: Heart size is enlarged with left atrial dilatation. Filling defect in the tip of the left atrial appendage, concerning for left atrial appendage thrombus. There is no significant pericardial fluid, thickening or pericardial calcification. There is atherosclerosis of the thoracic aorta, the great vessels of the mediastinum and the coronary arteries, including calcified atherosclerotic plaque in the left main, left anterior descending, left circumflex and right coronary arteries. Severe thickening calcifications of the aortic valve.  Profound calcification of the mitral-aortic intervalvular fibrosa. Severe thickening of the mitral valve and mitral annulus. Dilatation of the pulmonic trunk up to 4.2 cm in diameter, suggesting pulmonary arterial hypertension. Multiple prominent borderline enlarged and minimally enlarged mediastinal and bilateral hilar lymph nodes measuring up to 1 cm in short axis in the right hilum and 1.2 cm in short axis in the left hilum, similar to the prior examination from 06/11/2014. Esophagus is unremarkable in appearance. No axillary lymphadenopathy. Right subclavian artery is moderately tortuous and mildly calcified with minimum dimension of 9.6 x 9.1 mm proximally and 8.8 x 8.9 mm distally. Left subclavian artery is moderately tortuous and mildly calcified with a minimum dimension of 10.0 x 7.9 mm proximally, and 8.8 x 6.9 mm distally. The apex of the left ventricle is immediately deep to the interspace between the left sixth and seventh ribs anteriorly.  Lungs/Pleura: Diffuse bronchial wall thickening with moderate centrilobular and paraseptal emphysema. No acute consolidative airspace disease. 7 mm nodule in the left upper lobe with dense internal calcifications, compatible with a calcified granuloma, unchanged. 4 mm pulmonary nodules in the left lower lobe on 46 and 47 of series  507, both unchanged compared to prior CT the abdomen and pelvis 05/17/2012, considered benign. Small left and small to moderate right pleural effusions layering dependently, similar to the prior examination.  Musculoskeletal/Soft Tissues: There are no aggressive appearing lytic or blastic lesions noted in the visualized portions of the skeleton.  CTA ABDOMEN AND PELVIS FINDINGS  Hepatobiliary: Status post cholecystectomy. Liver has a slightly nodular contour, which could be indicative of underlying cirrhosis. No discrete cystic or solid hepatic lesions are identified on today's examination. No intra or  extrahepatic biliary ductal dilatation.  Pancreas: 1.0 x 1.4 cm poorly defined low-attenuation (11 HU) lesion in the proximal body of the pancreas (image 167 of series 501) is indeterminate. This appears to be associated with some mild focal ductal ectasia of the main pancreatic duct in the proximal body of the pancreas. No pancreatic or peripancreatic inflammatory changes.  Spleen: Unremarkable.  Adrenals/Urinary Tract: Numerous sub cm low-attenuation lesions in the kidneys bilaterally are too small to definitively characterize, but are favored to represent small cysts. Mild bilateral renal atrophy. Bilateral perinephric stranding. Mild bilateral hydroureter, without hydronephrosis. Status post radical cystectomy and neobladder, with right lower quadrant ostomy. Chronic adreniform thickening of the left adrenal gland is similar to prior examinations, presumably indicative of adrenal hyperplasia. Right adrenal gland is normal in appearance.  Stomach/Bowel: The appearance of the stomach is normal. No pathologic dilatation of small bowel or colon. Status post partial colectomy in the region of the ascending colon, presumably for neobladder.  Vascular/Lymphatic: Vascular findings and measurements pertinent to potential TAVR procedure, as detailed below. No lymphadenopathy noted in the abdomen or pelvis. There are numerous borderline enlarged retroperitoneal lymph nodes measuring up to 9 mm in the left para-aortic nodal station.  Reproductive: Postoperative changes of radical cystoprostatectomy are noted, with extensive dystrophic calcifications throughout the pelvic floor, particularly anteriorly.  Other: No significant volume of ascites. No pneumoperitoneum. Trace amount of edema in the presacral space.  Musculoskeletal: 7 mm of anterolisthesis of L4 upon L5. There are no aggressive appearing lytic or blastic lesions noted in the visualized portions of the  skeleton.  VASCULAR MEASUREMENTS PERTINENT TO TAVR:  AORTA:  Minimal Aortic Diameter - 14 x 16 mm  Severity of Aortic Calcification - severe  RIGHT PELVIS:  Right Common Iliac Artery -  Minimal Diameter - 8.4 x 6.5 mm  Tortuosity - mild  Calcification - severe  Right External Iliac Artery -  Minimal Diameter - 6.9 x 4.8 mm  Tortuosity - moderate  Calcification - severe  Right Common Femoral Artery -  Minimal Diameter - 4.4 x 4.6 mm  Tortuosity - mild  Calcification - severe  LEFT PELVIS:  Left Common Iliac Artery -  Minimal Diameter - 6.3 x 5.7 mm  Tortuosity - mild  Calcification - severe  Left External Iliac Artery -  Minimal Diameter - 4.3 x 7.4 mm  Tortuosity - moderate  Calcification - severe  Left Common Femoral Artery -  Minimal Diameter - 4.8 x 2.9 mm  Tortuosity - mild  Calcification - severe  Review of the MIP images confirms the above findings.  IMPRESSION: 1. Vascular findings and measurements pertinent to potential TAVR procedure, as detailed above. This patient does not appear to have suitable pelvic arterial access. However, this patient may have suitable subclavian arterial access if clinically appropriate. 2. Cardiomegaly with left atrial dilatation. Notably, there is a filling defect in the tip of the left atrial appendage, concerning for potential left atrial appendage thrombus. Correlation with  transesophageal echocardiography is suggested if not recently performed. 3. Severe thickening calcification of the aortic valve. In addition, there is very extensive calcification of the mitral aortic intervalvular fibrosa, which appears to encroach upon the left ventricular outflow tract in close proximity to the aortic annulus. This may have implications for planned TAVR procedure, and correlation with contemporaneously obtained cardiac CT results is recommended. 4. Dilatation of the  pulmonic trunk (4.2 cm in diameter), suggesting pulmonary arterial hypertension. 5. Small left and small to moderate right pleural effusions are chronic and similar to prior examination. 6. Mild diffuse bronchial wall thickening with moderate centrilobular and mild paraseptal emphysema; imaging findings suggestive of underlying COPD. 7. 1.4 x 0.9 cm low-attenuation lesion in the proximal body of the pancreas with some associated ductal ectasia of the main pancreatic duct. This is nonspecific, but the possibility of intraductal papillary mucinous neoplasm (IPMN) is not excluded, and further evaluation with nonemergent MRI of the abdomen with and without IV gadolinium is suggested in the 1 year to better evaluate this finding. This recommendation follows ACR consensus guidelines: Managing Incidental Findings on Abdominal CT: White Paper of the ACR Incidental Findings Committee. J Am Coll Radiol 2010;7:754-773. 8. Postoperative changes of radical cystoprostatectomy and neobladder formation for history of bladder cancer, as above. 9. Additional incidental findings, as above.   Electronically Signed  By: Vinnie Langton M.D.  On: 12/18/2014 14:09   STS Risk Calculator  ProcedureAVR  Risk of Mortality15.3% Morbidity or Mortality64.4% Prolonged LOS31.3% Short LOS9.2% Permanent Stroke5.6% Prolonged Vent Support43.2% DSW Infection0.3% Renal Failure42.5% Reoperation22.6%   Impression:  Patient has stage D severe symptomatic aortic stenosis. I have personally reviewed his most recent transthoracic echocardiogram, cardiac cath, and CTA studies.  There is severe calcification with thickening and restricted leaflet mobility involving all 3 leaflets of the aortic valve. Peak velocity across the aortic valve measured greater than 4 m/s corresponding to mean transvalvular gradient estimated 44 mmHg. There is at least mild to moderate aortic insufficiency. There is bulky calcification involving the mitral valve including both the anterior and posterior annulus extending into the left ventricular outflow tract and to the aortic valve annulus through the intervalvular fibrosa that may increase the risk of perivalvular leak. There is at least moderate mitral regurgitation. Left ventricular systolic function remains reasonably well preserved with ejection fraction estimated 55%. I think he would be a very high risk candidate for open surgical AVR given his risk factors as noted in the STS PROM score. His CTA studies show that he would be a candidate for a 29 mm Sapien 3 or XT valve but he does not have adequate pelvic arterial access due to severe concentric calcification of his iliac and femoral vessels. He also has extensive calcification of the arch vessels and some calcification of the ascending aorta. I think a trans-apical approach may be safest for him.   I discussed the operative procedure with him including what types of management strategies would be attempted intraoperatively in the event of life-threatening complications, including whether or not the patient would be considered a candidate for the use of cardiopulmonary bypass and/or conversion to open sternotomy for attempted surgical intervention.  The patient has been advised of a variety of complications that might develop including but not limited to risks of death, stroke, paravalvular leak, aortic dissection or other major vascular complications, aortic annulus rupture, device embolization, cardiac rupture or perforation, mitral regurgitation, acute myocardial infarction, arrhythmia, heart  block or bradycardia requiring permanent pacemaker placement, congestive heart failure,  respiratory failure, renal failure, pneumonia, infection, other late complications related to structural valve deterioration or migration, or other complications that might ultimately cause a temporary or permanent loss of functional independence or other long term morbidity.  The patient provides full informed consent for the procedure as described and all questions were answered.    Plan:  He will be scheduled for trans-apical TAVR with a 29 mm Sapien XT valve on 01/05/2015. He will stop his coumadin 7 days prior to surgery.     Gaye Pollack, MD 12/22/2014 4:21 PM

## 2014-12-22 NOTE — Progress Notes (Signed)
   Patient Name: [name] Tine Date of Encounter: 12/22/2014   SUBJECTIVE  No CP or SOB at rest. Had HD yesterday. Dyspneic with activity.   CURRENT MEDS . calcitRIOL  0.25 mcg Oral Daily  . diltiazem  180 mg Oral QHS  . lipase/protease/amylase  24,000 Units Oral TID WC  . loperamide  2 mg Oral BID  . metoprolol succinate  75 mg Oral Daily  . sevelamer carbonate  800 mg Oral TID WC  . sodium chloride  3 mL Intravenous Q12H    OBJECTIVE  Filed Vitals:   12/21/14 2030 12/21/14 2122 12/22/14 0023 12/22/14 0455  BP: 131/79 150/74 126/90 133/80  Pulse: 75 53  72  Temp:  98 F (36.7 C)  98.6 F (37 C)  TempSrc:  Oral  Oral  Resp: 16 18 16 18   Height:      Weight:    169 lb 4.8 oz (76.794 kg)  SpO2:  100% 99% 98%    Intake/Output Summary (Last 24 hours) at 12/22/14 0908 Last data filed at 12/22/14 0815  Gross per 24 hour  Intake    600 ml  Output   2750 ml  Net  -2150 ml   Filed Weights   12/21/14 0400 12/21/14 1613 12/22/14 0455  Weight: 171 lb 4.8 oz (77.701 kg) 170 lb 13.7 oz (77.5 kg) 169 lb 4.8 oz (76.794 kg)    PHYSICAL EXAM  General: Pleasant, NAD. Neuro: Alert and oriented X 3. Moves all extremities spontaneously. Psych: Normal affect. HEENT:  Normal  Neck: Supple without bruits or JVD. Lungs:  Resp regular and unlabored, CTA. Heart: RRR no s3, s4. systolic murmur. Abdomen: Soft, non-tender, non-distended, BS + x 4.  Extremities: No clubbing, cyanosis. 1+ BL LE edema. DP 1+ and equal bilaterally. AVF LUE with surrounding ecchymosis.   Accessory Clinical Findings  CBC  Recent Labs  12/21/14 0805 12/22/14 0448  WBC 9.5 7.2  HGB 12.7* 11.8*  HCT 37.2* 36.1*  MCV 105.1* 107.4*  PLT 148* 546*   Basic Metabolic Panel  Recent Labs  12/20/14 1123  NA 138  K 3.9  CL 100*  CO2 26  GLUCOSE 104*  BUN 41*  CREATININE 3.79*  CALCIUM 9.0  PHOS 4.5   Liver Function Tests  Recent Labs  12/20/14 1123  ALBUMIN 3.4*     TELE  afib  Radiology/Studies  ASSESSMENT AND PLAN   1 Severe AS-ongoing evaluation for TAVR. Await follow-up plans per Dr. Roxy Manns and Dr. Burt Knack. 2 Permanent atrial fibrillation-continue cardizem CD 180mg  and toprol XL 75mg  for rate control; possible LAA thrombus on CT; continue IV heparin; resume coumadin once all procedures complete. 3 ESRD-dialysis initiated. HD today. 4 hypertension-blood pressure controlled. 5 acute on chronic diastolic congestive heart failure-diuresed 475ml/13.5L. Appears 1+ LE edema. Not on any diuretics.   Plan: Had HD yesterday, still has LE swelling. Cr of 3.79. TAVR tentatively scheduled September 6th. Per yesterday's note, he is not ready for discharge from a renal standpoint, require outpatient HD arrangements. TEE at time of TAVR for possible LA appendage thrombus. He will f/u in clinic in 1 week post discharger.    Signed, Bhagat,Bhavinkumar PA-C  Pager [phone]  I have seen and examined the patient along with Bhagat,Bhavinkumar PA-C .  I have reviewed the chart, notes and new data.  I agree with PA's note.  PLAN: Note plans for TAVR, DC today.  Sanda Klein, MD, [place] [phone] 12/22/2014, 11:43 AM

## 2014-12-22 NOTE — Care Management Note (Signed)
Case Management Note Marvetta Gibbons RN, BSN Unit 2W-Case Manager [phone]  Patient Details  Name: [name] MRN: [id number] Date of Birth: [birth year]  Subjective/Objective:     Pt admitted with acute on chronic HF needs TAVR which is planned for Sept. 6- pt also had RF and needed HD on this admission now ESRD and had been set up for out pt HD at [facility] for T/T/S               Action/Plan: PTA pt lived at home with wife- plan is to return home once clipping process has been completed  Expected Discharge Date:       12/22/14           Expected Discharge Plan:  Home/Self Care  In-House Referral:     Discharge planning Services  CM Consult  Post Acute Care Choice:    Choice offered to:     DME Arranged:    DME Agency:     HH Arranged:    [place] Agency:     Status of Service:  Completed, signed off  Medicare Important Message Given:  Yes-fourth notification given Date Medicare IM Given:   12/21/14 Medicare IM give by:   Marvetta Gibbons RN Date Additional Medicare IM Given:    Additional Medicare Important Message give by:     If discussed at [place] of Stay Meetings, dates discussed:  12/22/14  Additional Comments:  Dawayne Patricia, RN 12/22/2014, 4:13 PM

## 2014-12-22 NOTE — Discharge Instructions (Signed)
PLEASE TAKE 5mg  of COUMADIN TONIGHT AND THEN RESUME YOUR NORMAL HOME DOSE SCHEDULE. PLEASE STOP COUMADIN 5 DAYS PRIOR TO YOUR SCHEDULED TAVR PROCEDURE. THEREFORE, STOP THE MEDICATION ON 12/31/2014 IN PREPARATION FOR YOUR SURGERY ON 01/05/2015.  RECHECK INR IN COUMADIN CLINIC EARLY NEXT WEEK (BEFORE 01/01/2015)  YOUR METOPROLOL DOSE IS NOW 75MG  DAILY. PLEASE TAKE ONE 50mg  TABLET AND ONE 25mg  TABLET DAILY UNTIL INSTRUCTED OTHERWISE.

## 2014-12-22 NOTE — Progress Notes (Signed)
Subjective: Ready to go home.  Objective: Vital signs in last 24 hours: Temp:  [98 F (36.7 C)-98.6 F (37 C)] 98.6 F (37 C) (08/23 0455) Pulse Rate:  [50-82] 72 (08/23 0455) Resp:  [16-24] 18 (08/23 0455) BP: (126-161)/(74-101) 133/80 mmHg (08/23 0455) SpO2:  [98 %-100 %] 98 % (08/23 0455) Weight:  [169 lb 4.8 oz (76.794 kg)-170 lb 13.7 oz (77.5 kg)] 169 lb 4.8 oz (76.794 kg) (08/23 0455) Weight change: -7.1 oz (-0.201 kg)  Intake/Output from previous day: 08/22 0701 - 08/23 0700 In: 720 [P.O.:720] Out: 3350 [Urine:1050] Intake/Output this shift: Total I/O In: 240 [P.O.:240] Out: -   General appearance: alert and cooperative, no acute distress Resp: Clear breath sounds in the posterior lung fields Cardio: systolic murmur: systolic ejection, harsh, 2/6 Extremities: 1+ edema bilateral lower extremities, AVF LUE with surrounding ecchymosis  Lab Results:  Recent Labs  12/21/14 0805 12/22/14 0448  WBC 9.5 7.2  HGB 12.7* 11.8*  HCT 37.2* 36.1*  PLT 148* 132*   BMET:   Recent Labs  12/20/14 1123  NA 138  K 3.9  CL 100*  CO2 26  GLUCOSE 104*  BUN 41*  CREATININE 3.79*  CALCIUM 9.0   Studies/Results: No results found.  Scheduled: . calcitRIOL  0.25 mcg Oral Daily  . diltiazem  180 mg Oral QHS  . lipase/protease/amylase  24,000 Units Oral TID WC  . loperamide  2 mg Oral BID  . metoprolol succinate  75 mg Oral Daily  . sevelamer carbonate  800 mg Oral TID WC  . sodium chloride  3 mL Intravenous Q12H   Assessment/Plan: #1 ESRD on HD: Going through CLIP process.  #2 severe aortic stenosis: Plan for TAVR on 9/6 per Cardiology.   LOS: 12 days   Posey Pronto, [name] 12/22/2014,9:18 AM   I have seen and examined this patient and agree with plan as outlined by Dr. Posey Pronto.  New EDW will be 77kg and will continue with MWF schedule for now.  Still awaiting outpt HD spot but hopefully will be [facility] as this is most convenient to his home.  Continue with inpatient HD until  he has outpt unit established and schedule. Noelly Lasseigne A,MD 12/22/2014 1:16 PM

## 2014-12-22 NOTE — Discharge Summary (Signed)
CARDIOLOGY DISCHARGE SUMMARY   Patient ID: [name] MRN: [id number] DOB/AGE: June 12, 1940 74 y.o.  Admit date: 12/10/2014 Discharge date: 12/22/2014  PCP: [name], MD Primary Cardiologist: Dr. Radford Pax  Primary Discharge Diagnosis: Acute on chronic diastolic heart failure Secondary Discharge Diagnosis: Essential hypertension, Aortic stenosis, severe, Chronic diastolic CHF class 3, Chronic kidney disease stage V, Chronic atrial fibrillation, Chronic anticoagulation, Bilateral edema of lower extremity   Consults:Nephrology, Cardiothoracic Surgery  Procedures: Right Heart Catheterization, Left Heart Catheterization, Coronary Angiography, Cardiac CT, CT Chest, Abdomen, and Pelvis  Hospital Course: Babak Lucus is a 74 y.o. 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 Patient had been seen by Dr. Radford Pax in the office on 12/09/14 with volume overload and she recommended he come to the hospital for IV diuresis, renal consult, and CVTS consult for TAVR.   Upon arriving to the ED, he initially received 69m IV Lasix in the ED and an additional dose overnight. Creatinine was also noted to be elevated to 5.10, with it previously being 3.85 six months prior. For his Atrial Fibrillation, rate control with Cardizem 1829mand Toprol XL 5088mas continued with Coumadin dosing per pharmacy.  On 12/11/14, his Lasix dosage was switched from IV to 5m2mD PO. This was further increased to 120mg42m on 12/14/14.  The patient wished to further discuss the necessary workup for TAVR procedure. He was informed the intervention may likely lead to progressive renal dysfunction and dialysis, which he was willing to do considering his declinecd functional status with his  current Aortic Stenosis.  On 12/14/2014, Dr. CoopeBurt Knackwith the patient and recommended right and left heart catheterization as the next step in his workup for possible TAVR procedure vs. Conventional surgical AVR. The risks and benefits of the possible procedures were explained in detail to the patient. Dr. Owen Roxy Manns Cardiothoracic Surgery also met with the patient on 12/14/14 and covered the possible treatment plans for the patient. It was decided to hold his Coumadin at that time in preparation for cardiac catheterization.  He underwent RHC and LHC on 12/16/14 which showed calcified but nonobstructive coronary artery disease, pulmonary HTN, and dense calcification of the aortic valve annulus extending into the mitral valve annulus, and at least moderate calcification of the ascending aorta. It was decided to continue with the evaluation for TAVR vs. Conventional AVR.  He tolerated the cardiac catheterization well and creatinine was stable at 4.76 the following day. The next step was to proceed with a Cardiac CT and CTA of the chest, abdomen, and pelvis and these were performed on 12/18/2014. The images showed the conventional approach for TAVR was not a good option and transapical TAVR should be considered. He received hemodialysis following the imaging procedures.  Of note, a filling defect in the tip of the left atrial appendage, concerning for left atrial appendage thrombus was found with CT. A TEE at the time of TAVR will be performed. He had been on subcutaneous heparin following the catheterization, and this was changed to IV Heparin until Coumadin can be resumed following procedures.  The patient continued to undergo dialysis the following days and had a net output of -15.6L this admission. [facility]  Tuesday, Thursday and Saturday 2nd shift schedule. His first session will be Thursday August 25th at 11:00.   Patient was last  examined by Dr. Sallyanne Kuster and deemed stable for discharge. Per Dr. Sallyanne Kuster, the patient should resume his home dose of Coumadin and stop the medication 5 days before his TAVR Procedure.  He is scheduled for TAVR on 01/05/2015. A follow-up/pre-TAVR appointment has been scheduled on 01/01/2015 with Ignacia Bayley, NP. According to office staff, Dr. Burt Knack will be in the office that day and can address patient's concerns or questions if they arise.  Also, Per Dr. Marval Regal, Sodium Bicarbonate will not need to be restarted upon discharge. Patient was discharged on 50m Toprol XL (574mtablet and 2534mablet).   Labs:   Lab Results  Component Value Date   WBC 7.2 12/22/2014   HGB 11.8* 12/22/2014   HCT 36.1* 12/22/2014   MCV 107.4* 12/22/2014   PLT 132* 12/22/2014     Recent Labs Lab 12/22/14 0946  NA 134*  K 3.7  CL 95*  CO2 29  BUN 25*  CREATININE 3.10*  CALCIUM 8.7*  GLUCOSE 95    B NATRIURETIC PEPTIDE  Date/Time Value Ref Range Status  12/10/2014 11:41 PM 3973.1* 0.0 - 100.0 pg/mL Final  06/09/2014 04:41 PM 2524.7* 0.0 - 100.0 pg/mL Final   PRO B NATRIURETIC PEPTIDE (BNP)  Date/Time Value Ref Range Status  12/09/2014 03:22 PM 3135.0* 0.0 - 100.0 pg/mL Final  07/22/2013 02:40 PM 580.0* 0.0 - 100.0 pg/mL Final    Recent Labs  12/22/14 0448  INR 1.11      Radiology:  Dg Chest 2 View: 12/10/2014   CLINICAL DATA:  Cough x3 months. SOB x8 months. No chest pain.Hx of HTN, CKD, PAF, Cardiomyopathy, aortic valve stenosis.  EXAM: CHEST  2 VIEW  COMPARISON:  10/13/2014  FINDINGS: Cardiopericardial silhouette is mildly enlarged. Aorta is mildly uncoiled. No mediastinal or hilar masses or evidence of adenopathy.  Mild interstitial prominence in the lower lungs. Minimal pleural effusions. No lung consolidation to suggest pneumonia. No pneumothorax.  Bony thorax is demineralized but grossly intact.  IMPRESSION: 1. Since prior study, mild interstitial thickening has developed in the lower  lungs along with minimal pleural effusions. There is stable mild cardiomegaly. Findings suggest mild congestive heart failure. No convincing pneumonia.   Electronically Signed   By: DavLajean ManesD.   On: 12/10/2014 19:14   Ct Coronary Morp W/cta Cor W/score W/ca W/cm &/or Wo/cm: 12/18/2014   ADDENDUM REPORT: 12/18/2014 18:37  CLINICAL DATA:  Aortic stenosis  EXAM: Cardiac TAVR CT  TECHNIQUE: The patient was scanned on a Philips 256 scanner. A 120 kV retrospective scan was triggered in the descending thoracic aorta at 111 HU's. Gantry rotation speed was 270 msecs and collimation was .9 mm. 5 mg of iv Metoprolol and no nitro were given. The 3D data set was reconstructed in 5% intervals of the R-R cycle. Systolic and diastolic phases were analyzed on a dedicated work station using MPR, MIP and VRT modes. The patient received 80 cc of contrast.  FINDINGS: Aortic Valve: Severe leaflet calcification with severely restricted leaflet opening. There is significant asymmetric calcification under the non-coronary leaflet extending into the LVOT that continues into mitral annulus.  Aorta: Normal caliber. Moderate diffuse calcifications in the entire thoracic aorta. No dissection.  Sinotubular Junction:  31 x 29 mm  Ascending Thoracic Aorta:  36 x 35 mm  Aortic Arch:  27 x 27 mm  Descending Thoracic Aorta:  30  x 30 mm  Sinus of Valsalva Measurements:  Non-coronary:  32 mm  Right -coronary:  33 mm  Left -coronary:  36 mm  Coronary Artery Height above Annulus:  Left Main:  11 mm  Right Coronary:  12 mm  Virtual Basal Annulus Measurements:  Maximum/Minimum Diameter:  30 x 23 mm  Perimeter:  97 mm  Area:  560 mm2  Optimum Fluoroscopic Angle for Delivery:  LAO 11 CAU 7  Other findings: Dilated pulmonary artery (41 x 37 mm) suggestive of pulmonary hypertension.  Normal pulmonary vein drainage.  Moderate concentric LVH.  Severely dilated left atrium.  There is a filling defect in the left atrial appendage that might represent  incomplete contrast penetration vs a small thrombus.  IMPRESSION: 1. Severe leaflet calcification with severely restricted leaflet opening and annular measurement suitable for delivery of 29 mm Edward SAPIEN 3 valve.  There is significant asymmetric calcification located under the non-coronary leaflet extending into the LVOT.  2.  Sufficient coronary to annulus distance.  3. Optimum Fluoroscopic Angle for Delivery: LAO 11 CAU 7  4. There is a filling defect in the left atrial appendage that might represent incomplete contrast penetration vs a small thrombus.  Ena Dawley   Electronically Signed   By: Ena Dawley   On: 12/18/2014 18:37   12/18/2014   EXAM: OVER-READ INTERPRETATION  CT CHEST  The following report is an over-read performed by radiologist Dr. Rebekah [facility] on 12/18/2014. This over-read does not include interpretation of cardiac or coronary anatomy or pathology. The coronary calcium score/coronary CTA interpretation by the cardiologist is attached.  COMPARISON:  Chest CT 06/11/2014.  FINDINGS: Extracardiac findings better demonstrated on contemporaneously obtained CTA of the chest, abdomen and pelvis 12/18/2014.  IMPRESSION: Please see separate dictation for full description of extracardiac findings on contemporaneously obtained CTA of the chest, abdomen and pelvis 12/18/2014.  Electronically Signed: By: Vinnie Langton M.D. On: 12/18/2014 13:08   Ct Angio Chest Aorta W/cm &/or Wo/cm: 12/18/2014   CLINICAL DATA:  74 year old male with history of severe aortic stenosis. Preprocedural study prior to potential transcatheter aortic valve replacement (TAVR) procedure.  EXAM: CT ANGIOGRAPHY CHEST WITH CONTRAST  TECHNIQUE: Multidetector CT imaging of the chest was performed using the standard protocol during bolus administration of intravenous contrast. Multiplanar CT image reconstructions and MIPs were obtained to evaluate the vascular anatomy.  CONTRAST:  43m OMNIPAQUE  IOHEXOL 350 MG/ML SOLN  COMPARISON:  Chest CT 06/11/2014. CT the abdomen and pelvis 05/17/2012.  FINDINGS: CTA CHEST FINDINGS  Mediastinum/Lymph Nodes: Heart size is enlarged with left atrial dilatation. Filling defect in the tip of the left atrial appendage, concerning for left atrial appendage thrombus. There is no significant pericardial fluid, thickening or pericardial calcification. There is atherosclerosis of the thoracic aorta, the great vessels of the mediastinum and the coronary arteries, including calcified atherosclerotic plaque in the left main, left anterior descending, left circumflex and right coronary arteries. Severe thickening calcifications of the aortic valve. Profound calcification of the mitral-aortic intervalvular fibrosa. Severe thickening of the mitral valve and mitral annulus. Dilatation of the pulmonic trunk up to 4.2 cm in diameter, suggesting pulmonary arterial hypertension. Multiple prominent borderline enlarged and minimally enlarged mediastinal and bilateral hilar lymph nodes measuring up to 1 cm in short axis in the right hilum and 1.2 cm in short axis in the left hilum, similar to the prior examination from 06/11/2014. Esophagus is unremarkable in appearance. No axillary lymphadenopathy. Right subclavian artery is moderately  tortuous and mildly calcified with minimum dimension of 9.6 x 9.1 mm proximally and 8.8 x 8.9 mm distally. Left subclavian artery is moderately tortuous and mildly calcified with a minimum dimension of 10.0 x 7.9 mm proximally, and 8.8 x 6.9 mm distally. The apex of the left ventricle is immediately deep to the interspace between the left sixth and seventh ribs anteriorly.  Lungs/Pleura: Diffuse bronchial wall thickening with moderate centrilobular and paraseptal emphysema. No acute consolidative airspace disease. 7 mm nodule in the left upper lobe with dense internal calcifications, compatible with a calcified granuloma, unchanged. 4 mm pulmonary nodules in the  left lower lobe on 46 and 47 of series 507, both unchanged compared to prior CT the abdomen and pelvis 05/17/2012, considered benign. Small left and small to moderate right pleural effusions layering dependently, similar to the prior examination.  Musculoskeletal/Soft Tissues: There are no aggressive appearing lytic or blastic lesions noted in the visualized portions of the skeleton.  CTA ABDOMEN AND PELVIS FINDINGS  Hepatobiliary: Status post cholecystectomy. Liver has a slightly nodular contour, which could be indicative of underlying cirrhosis. No discrete cystic or solid hepatic lesions are identified on today's examination. No intra or extrahepatic biliary ductal dilatation.  Pancreas: 1.0 x 1.4 cm poorly defined low-attenuation (11 HU) lesion in the proximal body of the pancreas (image 167 of series 501) is indeterminate. This appears to be associated with some mild focal ductal ectasia of the main pancreatic duct in the proximal body of the pancreas. No pancreatic or peripancreatic inflammatory changes.  Spleen: Unremarkable.  Adrenals/Urinary Tract: Numerous sub cm low-attenuation lesions in the kidneys bilaterally are too small to definitively characterize, but are favored to represent small cysts. Mild bilateral renal atrophy. Bilateral perinephric stranding. Mild bilateral hydroureter, without hydronephrosis. Status post radical cystectomy and neobladder, with right lower quadrant ostomy. Chronic adreniform thickening of the left adrenal gland is similar to prior examinations, presumably indicative of adrenal hyperplasia. Right adrenal gland is normal in appearance.  Stomach/Bowel: The appearance of the stomach is normal. No pathologic dilatation of small bowel or colon. Status post partial colectomy in the region of the ascending colon, presumably for neobladder.  Vascular/Lymphatic: Vascular findings and measurements pertinent to potential TAVR procedure, as detailed below. No lymphadenopathy noted in  the abdomen or pelvis. There are numerous borderline enlarged retroperitoneal lymph nodes measuring up to 9 mm in the left para-aortic nodal station.  Reproductive: Postoperative changes of radical cystoprostatectomy are noted, with extensive dystrophic calcifications throughout the pelvic floor, particularly anteriorly.  Other: No significant volume of ascites. No pneumoperitoneum. Trace amount of edema in the presacral space.  Musculoskeletal: 7 mm of anterolisthesis of L4 upon L5. There are no aggressive appearing lytic or blastic lesions noted in the visualized portions of the skeleton.  VASCULAR MEASUREMENTS PERTINENT TO TAVR:  AORTA:  Minimal Aortic Diameter -  14 x 16 mm  Severity of Aortic Calcification -  severe  RIGHT PELVIS:  Right Common Iliac Artery -  Minimal Diameter - 8.4 x 6.5 mm  Tortuosity - mild  Calcification - severe  Right External Iliac Artery -  Minimal Diameter - 6.9 x 4.8 mm  Tortuosity - moderate  Calcification - severe  Right Common Femoral Artery -  Minimal Diameter - 4.4 x 4.6 mm  Tortuosity - mild  Calcification - severe  LEFT PELVIS:  Left Common Iliac Artery -  Minimal Diameter - 6.3 x 5.7 mm  Tortuosity - mild  Calcification - severe  Left External Iliac Artery -  Minimal Diameter - 4.3 x 7.4 mm  Tortuosity - moderate  Calcification - severe  Left Common Femoral Artery -  Minimal Diameter - 4.8 x 2.9 mm  Tortuosity - mild  Calcification - severe  Review of the MIP images confirms the above findings.  IMPRESSION: 1. Vascular findings and measurements pertinent to potential TAVR procedure, as detailed above. This patient does not appear to have suitable pelvic arterial access. However, this patient may have suitable subclavian arterial access if clinically appropriate. 2. Cardiomegaly with left atrial dilatation. Notably, there is a filling defect in the tip of the left atrial appendage, concerning for potential left atrial appendage thrombus. Correlation with transesophageal  echocardiography is suggested if not recently performed. 3. Severe thickening calcification of the aortic valve. In addition, there is very extensive calcification of the mitral aortic intervalvular fibrosa, which appears to encroach upon the left ventricular outflow tract in close proximity to the aortic annulus. This may have implications for planned TAVR procedure, and correlation with contemporaneously obtained cardiac CT results is recommended. 4. Dilatation of the pulmonic trunk (4.2 cm in diameter), suggesting pulmonary arterial hypertension. 5. Small left and small to moderate right pleural effusions are chronic and similar to prior examination. 6. Mild diffuse bronchial wall thickening with moderate centrilobular and mild paraseptal emphysema; imaging findings suggestive of underlying COPD. 7. 1.4 x 0.9 cm low-attenuation lesion in the proximal body of the pancreas with some associated ductal ectasia of the main pancreatic duct. This is nonspecific, but the possibility of intraductal papillary mucinous neoplasm (IPMN) is not excluded, and further evaluation with nonemergent MRI of the abdomen with and without IV gadolinium is suggested in the 1 year to better evaluate this finding. This recommendation follows ACR consensus guidelines: Managing Incidental Findings on Abdominal CT: White Paper of the ACR Incidental Findings Committee. J Am Coll Radiol 2010;7:754-773. 8. Postoperative changes of radical cystoprostatectomy and neobladder formation for history of bladder cancer, as above. 9. Additional incidental findings, as above.   Electronically Signed   By: Vinnie Langton M.D.   On: 12/18/2014 14:09     Cardiac Cath: 12/16/14  Prox RCA lesion, 30% stenosed.  Dist RCA lesion, 25% stenosed.  Prox Cx lesion, 40% stenosed.  Assessment: 1. Calcified but nonobstructive coronary artery disease 2. 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 Dense calcification of the aortic valve  annulus extending into the mitral valve annulus, and at least moderate calcification of the ascending aorta.  Plan: Continued evaluation by the Multidisciplinary Valve Team for TAVR versus conventional AVR    FOLLOW UP PLANS AND APPOINTMENTS Allergies  Allergen Reactions  . Amiodarone Other (See Comments)    Severely reduced DLCO  . Oxycodone Hcl Rash and Other (See Comments)    Rash and itching     Medication List    STOP taking these medications        furosemide 40 MG tablet  Commonly known as:  LASIX     furosemide 80 MG tablet  Commonly known as:  LASIX     sodium bicarbonate 650 MG tablet      TAKE these medications        calcitRIOL 0.25 MCG capsule  Commonly known as:  ROCALTROL  Take 0.25 mcg by mouth daily.     CENTRUM SILVER ULTRA MENS PO  Take 1 tablet by mouth daily. Take daily     CREON 24000 UNITS Cpep  Generic drug:  Pancrelipase (Lip-Prot-Amyl)  Take one (1) capsule by mouth  with each meal     diltiazem 180 MG 24 hr capsule  Commonly known as:  CARDIZEM CD  Take 180 mg by mouth at bedtime.     ferrous sulfate 325 (65 FE) MG tablet  Take 325 mg by mouth daily.     IMODIUM PO  Take 1 tablet by mouth 2 (two) times daily.     metoprolol succinate 50 MG 24 hr tablet  Commonly known as:  TOPROL-XL  Take 1 tablet (50 mg total) by mouth daily.     metoprolol succinate 25 MG 24 hr tablet  Commonly known as:  TOPROL-XL  Take 1 tablet (25 mg total) by mouth daily. Take with or immediately following a meal.     PRESCRIPTION MEDICATION  Take 1 tablet by mouth daily. (4-STRAIN PROBIOTIC)     sevelamer carbonate 800 MG tablet  Commonly known as:  RENVELA  Take one (1) tablet by mouth with each meal     warfarin 5 MG tablet  Commonly known as:  COUMADIN  Take 0.5 tablets (2.5 mg total) by mouth daily. Take as directed by coumadin clinic         Follow-up Information    Follow up with Murray Hodgkins, NP On 01/01/2015.   Specialties:  Nurse  Practitioner, Cardiology, Radiology   Why:  Hospital Follow-up; Pre-TAVR Appt on 01/01/15 at 11:30AM.    Contact information:   [address] [address] [phone]       BRING ALL MEDICATIONS WITH YOU TO FOLLOW UP APPOINTMENTS  Time spent with patient to include physician time: > 30  Minutes  Signed: Dineen Kid, [place] 12/22/2014, 3:32 PM Co-Sign MD

## 2014-12-22 NOTE — Progress Notes (Signed)
ANTICOAGULATION CONSULT NOTE - Follow Up Consult  Pharmacy Consult for Heparin  Indication: atrial fibrillation  Allergies  Allergen Reactions  . Amiodarone Other (See Comments)    Severely reduced DLCO  . Oxycodone Hcl Rash and Other (See Comments)    Rash and itching    Patient Measurements: Height: 5\' 8"  (172.7 cm) Weight: 169 lb 4.8 oz (76.794 kg) IBW/kg (Calculated) : 68.4  Vital Signs: Temp: 98.6 F (37 C) (08/23 0455) Temp Source: Oral (08/23 0455) BP: 133/80 mmHg (08/23 0455) Pulse Rate: 72 (08/23 0455)  Labs:  Recent Labs  12/19/14 0825  12/20/14 0433 12/20/14 1123  12/20/14 2303 12/21/14 0744 12/21/14 0805 12/22/14 0448  HGB  --   --  11.8*  --   --   --   --  12.7*  --   HCT  --   --  36.2*  --   --   --   --  37.2*  --   PLT  --   --  147*  --   --   --   --  148*  --   LABPROT  --   --  16.6*  --   --   --  15.3*  --  14.5  INR  --   --  1.33  --   --   --  1.19  --  1.11  HEPARINUNFRC  --   < > 0.91*  --   < > 0.57 0.32  --  0.14*  CREATININE 4.23*  --   --  3.79*  --   --   --   --   --   < > = values in this interval not displayed.  Estimated Creatinine Clearance: 16.5 mL/min (by C-G formula based on Cr of 3.79).   Assessment: Heparin level is sub-therapeutic, no issues per RN.   Goal of Therapy:  Heparin level 0.3-0.7 units/ml Monitor platelets by anticoagulation protocol: Yes   Plan:  -Increase heparin to 1100 units/hr -1400 HL -Daily CBC/HL -Monitor for bleeding  [name] 12/22/2014,6:21 AM

## 2014-12-22 NOTE — Progress Notes (Signed)
12/22/2014 2:55 PM Hemodialysis Outpatient Note; this patient has been accepted at the [facility], on a Tuesday, Thursday and Saturday 2nd shift schedule. The patient can begin treatment on Thursday August 25th at 11:00.  Thank you. Gordy Savers

## 2014-12-23 ENCOUNTER — Telehealth: Payer: Self-pay | Admitting: Cardiovascular Disease

## 2014-12-23 NOTE — Telephone Encounter (Signed)
Pt was made aware that there are no documentation of phone called to him today. Pt remember that a nurse name  Thurmond Butts had called him. pt is aware that a nurse name Electronics engineer works at Dr. Jenna Luo office ph # [phone] give for him to call . Pt is scheduled for surgery on 9/6 with Dr. Ricard Dillon. Pt said the he will call that phone number.

## 2014-12-23 NOTE — Telephone Encounter (Signed)
New Message  Pt stated that someone called him from this office today concerning surgery coming up in 2 weeks. Pt will contact Dr Roxy Manns as well. Please call back and discuss.

## 2014-12-25 ENCOUNTER — Other Ambulatory Visit: Payer: Self-pay | Admitting: *Deleted

## 2014-12-30 ENCOUNTER — Encounter (HOSPITAL_COMMUNITY): Payer: Self-pay

## 2014-12-30 ENCOUNTER — Ambulatory Visit (INDEPENDENT_AMBULATORY_CARE_PROVIDER_SITE_OTHER): Payer: MEDICARE | Admitting: Thoracic Surgery (Cardiothoracic Vascular Surgery)

## 2014-12-30 ENCOUNTER — Encounter (HOSPITAL_COMMUNITY): Payer: Self-pay | Admitting: Certified Registered"

## 2014-12-30 ENCOUNTER — Ambulatory Visit (HOSPITAL_COMMUNITY)
Admission: RE | Admit: 2014-12-30 | Discharge: 2014-12-30 | Disposition: A | Discharge status: Home / Self Care | Payer: MEDICARE | Source: Ambulatory Visit | Attending: Thoracic Surgery (Cardiothoracic Vascular Surgery) | Admitting: Thoracic Surgery (Cardiothoracic Vascular Surgery)

## 2014-12-30 ENCOUNTER — Encounter (HOSPITAL_COMMUNITY)
Admit: 2014-12-30 | Discharge: 2014-12-30 | Disposition: A | Discharge status: Home / Self Care | Payer: MEDICARE | Attending: Thoracic Surgery (Cardiothoracic Vascular Surgery) | Admitting: Thoracic Surgery (Cardiothoracic Vascular Surgery)

## 2014-12-30 ENCOUNTER — Encounter: Payer: Self-pay | Admitting: Thoracic Surgery (Cardiothoracic Vascular Surgery)

## 2014-12-30 VITALS — BP 124/78 | HR 52 | Resp 20 | Ht 68.0 in | Wt 164.0 lb

## 2014-12-30 VITALS — BP 138/82 | HR 85 | Temp 98.2°F | Resp 20 | Ht 68.5 in | Wt 170.6 lb

## 2014-12-30 DIAGNOSIS — I5032 Chronic diastolic (congestive) heart failure: Secondary | ICD-10-CM | POA: Diagnosis not present

## 2014-12-30 DIAGNOSIS — J449 Chronic obstructive pulmonary disease, unspecified: Secondary | ICD-10-CM | POA: Insufficient documentation

## 2014-12-30 DIAGNOSIS — I517 Cardiomegaly: Secondary | ICD-10-CM | POA: Diagnosis not present

## 2014-12-30 DIAGNOSIS — I350 Nonrheumatic aortic (valve) stenosis: Secondary | ICD-10-CM | POA: Insufficient documentation

## 2014-12-30 DIAGNOSIS — J8410 Pulmonary fibrosis, unspecified: Secondary | ICD-10-CM | POA: Insufficient documentation

## 2014-12-30 DIAGNOSIS — R0602 Shortness of breath: Secondary | ICD-10-CM | POA: Diagnosis not present

## 2014-12-30 DIAGNOSIS — R0600 Dyspnea, unspecified: Secondary | ICD-10-CM | POA: Diagnosis not present

## 2014-12-30 HISTORY — DX: Pneumonia, unspecified organism: J18.9

## 2014-12-30 HISTORY — DX: Noninfective gastroenteritis and colitis, unspecified: K52.9

## 2014-12-30 HISTORY — DX: Complete loss of teeth, unspecified cause, unspecified class: K08.109

## 2014-12-30 HISTORY — DX: Cardiac arrhythmia, unspecified: I49.9

## 2014-12-30 HISTORY — DX: Presence of external hearing-aid: Z97.4

## 2014-12-30 HISTORY — DX: Heart failure, unspecified: I50.9

## 2014-12-30 HISTORY — DX: Presence of dental prosthetic device (complete) (partial): Z97.2

## 2014-12-30 LAB — CBC
HCT: 36.2 % — ABNORMAL LOW (ref 39.0–52.0)
HEMOGLOBIN: 12.2 g/dL — AB (ref 13.0–17.0)
MCH: 36 pg — AB (ref 26.0–34.0)
MCHC: 33.7 g/dL (ref 30.0–36.0)
MCV: 106.8 fL — AB (ref 78.0–100.0)
PLATELETS: 187 10*3/uL (ref 150–400)
RBC: 3.39 MIL/uL — AB (ref 4.22–5.81)
RDW: 17 % — ABNORMAL HIGH (ref 11.5–15.5)
WBC: 6.5 10*3/uL (ref 4.0–10.5)

## 2014-12-30 LAB — COMPREHENSIVE METABOLIC PANEL
ALK PHOS: 49 U/L (ref 38–126)
ALT: 25 U/L (ref 17–63)
AST: 31 U/L (ref 15–41)
Albumin: 3.5 g/dL (ref 3.5–5.0)
Anion gap: 10 (ref 5–15)
BUN: 36 mg/dL — AB (ref 6–20)
CALCIUM: 8.9 mg/dL (ref 8.9–10.3)
CHLORIDE: 101 mmol/L (ref 101–111)
CO2: 27 mmol/L (ref 22–32)
CREATININE: 3.99 mg/dL — AB (ref 0.61–1.24)
GFR calc non Af Amer: 14 mL/min — ABNORMAL LOW (ref >60)
GFR, EST AFRICAN AMERICAN: 16 mL/min — AB (ref >60)
Glucose, Bld: 93 mg/dL (ref 65–99)
Potassium: 3.9 mmol/L (ref 3.5–5.1)
SODIUM: 138 mmol/L (ref 135–145)
Total Bilirubin: 0.8 mg/dL (ref 0.3–1.2)
Total Protein: 6.8 g/dL (ref 6.5–8.1)

## 2014-12-30 LAB — PROTIME-INR
INR: 1.28 (ref 0.00–1.49)
Prothrombin Time: 16.2 seconds — ABNORMAL HIGH (ref 11.6–15.2)

## 2014-12-30 LAB — URINE MICROSCOPIC-ADD ON

## 2014-12-30 LAB — TYPE AND SCREEN
ABO/RH(D): AB POS
ANTIBODY SCREEN: NEGATIVE

## 2014-12-30 LAB — BLOOD GAS, ARTERIAL
ACID-BASE DEFICIT: 0 mmol/L (ref 0.0–2.0)
Bicarbonate: 24 mEq/L (ref 20.0–24.0)
DRAWN BY: 12971
FIO2: 0.21
O2 Saturation: 93.1 %
PCO2 ART: 37.9 mmHg (ref 35.0–45.0)
PH ART: 7.418 (ref 7.350–7.450)
Patient temperature: 98.6
TCO2: 25.1 mmol/L (ref 0–100)
pO2, Arterial: 70.8 mmHg — ABNORMAL LOW (ref 80.0–100.0)

## 2014-12-30 LAB — URINALYSIS, ROUTINE W REFLEX MICROSCOPIC
Bilirubin Urine: NEGATIVE
GLUCOSE, UA: NEGATIVE mg/dL
KETONES UR: NEGATIVE mg/dL
NITRITE: NEGATIVE
PH: 8 (ref 5.0–8.0)
Protein, ur: >300 mg/dL — AB
SPECIFIC GRAVITY, URINE: 1.017 (ref 1.005–1.030)
Urobilinogen, UA: 0.2 mg/dL (ref 0.0–1.0)

## 2014-12-30 LAB — SURGICAL PCR SCREEN
MRSA, PCR: NEGATIVE
STAPHYLOCOCCUS AUREUS: NEGATIVE

## 2014-12-30 LAB — APTT: aPTT: 29 seconds (ref 24–37)

## 2014-12-30 NOTE — Pre-Procedure Instructions (Addendum)
    [name] Tine  12/30/2014      [facility] [address] - [place], [place] - [address] AT [place] [address] Phone: [phone] Fax: [phone]  [facility] [address] [place] - [address] [place] [place] [address] Phone: [phone] Fax: [phone]    Your procedure is scheduled on Tuesday January 05 2015.  Report to [facility] Admitting at 530 A.M.  Call this number if you have problems the morning of surgery:  [phone]   Call this number if you have problems in the days leading up to your surgery:  [phone]    Remember:  Do not eat food or drink liquids after midnight Monday September 5th 2016.  Take these medicines the morning of surgery with A SIP OF WATER if needed: tylenol, Renvela    STOP: ALL Vitamins, Supplements, Effient and Herbal Medications, Fish Oils, Aspirins, NSAIDs (Nonsteroidal Anti-inflammatories such as Ibuprofen, Aleve, or Advil), and Goody's/BC Powders 7 days prior to surgery, until after surgery as directed by your physician.   STOP warfarin/coumadin tablet as directed by the surgeon's office (patient has already stopped it yesterday August 30th 2016)   Do not wear jewelry.  Do not wear lotions, powders, or perfumes.  You may wear deodorant.  Do not shave 48 hours prior to surgery.  Men may shave face and neck.  Do not bring valuables to the hospital.  [facility] is not responsible for any belongings or valuables.  Contacts, dentures or bridgework may not be worn into surgery.  Leave your suitcase in the car.  After surgery it may be brought to your room.  For patients admitted to the hospital, discharge time will be determined by your treatment team.  Patients discharged the day of surgery will not be allowed to drive home.   Special instructions:  Please follow these instructions carefully:  1. Shower  with CHG Soap the night before surgery and the morning of Surgery. 2. If you choose to wash your hair, wash your hair first as usual with your normal shampoo. 3. After you shampoo, rinse your hair and body thoroughly to remove the Shampoo. 4. Use CHG as you would any other liquid soap. You can apply chg directly to the skin and wash gently with scrungie or a clean washcloth. 5. Apply the CHG Soap to your body ONLY FROM THE NECK DOWN. Do not use on open wounds or open sores. Avoid contact with your eyes, ears, mouth and genitals (private parts). Wash genitals (private parts) with your normal soap. 6. Wash thoroughly, paying special attention to the area where your surgery will be performed. 7. Thoroughly rinse your body with warm water from the neck down. 8. DO NOT shower/wash with your normal soap after using and rinsing off the CHG Soap. 9. Pat yourself dry with a clean towel.  10. Wear clean pajamas.  11. Place clean sheets on your bed the night of your first shower and do not sleep with pets.  Day of Surgery  Do not apply any lotions/deodorants the morning of surgery. Please wear clean clothes to the hospital/surgery center.    Please read over the following fact sheets that you were given. Pain Booklet, Coughing and Deep Breathing, Blood Transfusion Information, MRSA Information and Surgical Site Infection Prevention

## 2014-12-30 NOTE — Progress Notes (Addendum)
[facility]       CARDIOTHORACIC SURGERY NOTE  Referring Provider is [name], MD Primary Cardiologist is TURNER, Eber Hong, MD PCP is [name], MD   HPI:  Patient is a 74 year old male with history of aortic stenosis,hypertension, stage V chronic kidney disease, chronic persistent atrial fibrillation, chronic diastolic congestive heart failure, and orthostatic hypotension who returns to the office today for follow up of severe symptomatic aortic stenosis.  He was just recently discharged from the hospital after having presented with an acute exacerbation of chronic diastolic congestive heart failure in the setting of severe aortic stenosis, long standing hypertension, chronic persistent atrial fibrillation and dialysis-dependent renal failure. During his hospitalization he was evaluated by a multidisciplinary team of specialists and a plan for transcatheter aortic valve replacement via transapical approach on Tuesday, 01/05/2015 was established.  He returns to the office today for follow-up and reports no new problems or complaints over the last few days. He still gets dyspneic with low level physical activity, but he denies any resting shortness of breath. He has not been having any chest pain or chest tightness. Dialysis treatment was performed yesterday without any reported issues.  The remainder of his review of systems is unchanged from previously.    Current Outpatient Prescriptions  Medication Sig Dispense Refill  . calcitRIOL (ROCALTROL) 0.25 MCG capsule Take 0.25 mcg by mouth daily.    [name] diltiazem (CARDIZEM CD) 180 MG 24 hr capsule Take 180 mg by mouth at bedtime.    . ferrous sulfate 325 (65 FE) MG tablet Take 325 mg by mouth daily.    . Loperamide HCl (IMODIUM PO) Take 1 tablet by mouth 2 (two) times daily.    . metoprolol tartrate (LOPRESSOR) 25 MG tablet Take 75 mg by mouth 1 day or 1 dose.    . Multiple  Vitamins-Minerals (CENTRUM SILVER ULTRA MENS PO) Take 1 tablet by mouth daily. Take daily    . Pancrelipase, Lip-Prot-Amyl, (CREON) 24000 UNITS CPEP Take one (1) capsule by mouth with each meal    . PRESCRIPTION MEDICATION Take 1 tablet by mouth daily. (4-STRAIN PROBIOTIC)    . sevelamer carbonate (RENVELA) 800 MG tablet 1,600 mg 3 (three) times daily with meals. Take one (1) tablet by mouth with each meal    . warfarin (COUMADIN) 5 MG tablet Take 0.5 tablets (2.5 mg total) by mouth daily. Take as directed by coumadin clinic (Patient taking differently: Take 2.5-5 mg by mouth daily. 2.5 mg daily except 5 mg on Thursday) 90 tablet 1   No current facility-administered medications for this visit.      Physical Exam:   BP 124/78 mmHg  Pulse 52  Resp 20  Ht 5\' 8"  (1.727 m)  Wt 164 lb (74.39 kg)  BMI 24.94 kg/m2  SpO2 90%  General:  Elderly but well-appearing  Chest:   Scattered inspiratory crackles  CV:   Irregular rate and rhythm with prominent systolic murmur  Incisions:  n/a  Abdomen:  Soft and nontender  Extremities:  Warm and well-perfused  Diagnostic Tests:  n/a   Impression:  Patient has stage D severe symptomatic aortic stenosis. I have personally reviewed the patient's most recent transthoracic echocardiogram. There is severe calcification with thickening and restricted leaflet mobility involving all 3 leaflets of the aortic valve. Peak velocity across the aortic valve measured greater than 4 m/s corresponding to mean transvalvular gradient estimated 44 mmHg. There is at least mild to moderate  aortic insufficiency. There also appears to be bulky calcification involving the mitral valve including both the anterior and posterior annulus. This calcification appears to extend into the left ventricular outflow tract and to the aortic valve annulus through the intervalvular fibrosa. There is moderate mitral regurgitation. Left ventricular systolic function remains reasonably well  preserved with ejection fraction estimated 55%.  Diagnostic cardiac catheterization is notable for the absence of significant coronary artery disease but also notable for the presence of extensive calcification throughout the aortic root, mitral annulus, the left ventricular output tract, and ascending thoracic aorta.  Risks associated with conventional surgical aortic valve replacement would unquestionably be extremely high, and I do not feel that the patient should be considered a candidate for open surgical aortic valve replacement.  Cardiac gated CT angiogram of the heart reveals anatomical features acceptable for transcatheter aortic valve replacement.  However, the presence of significant calcification in the aortic root and left ventricular outflow tract might increase the risk of paravalvular leak.  Unfortunately, CT angiogram of the chest, abdomen, and pelvis demonstrates that the patient does not have adequate pelvic vascular access for a transfemoral approach. He has significant disease in the transverse aortic arch and with left upper arm AV fistula of the left subclavian approach probably should be avoided.  Finally, cardiac-gated CTA of heart raised a question of possible small thrombus in LA appendage.  The patient was not discharged from the hospital on anticoagulation.       Plan:  The patient and his wife were again counseled at length regarding treatment alternatives for management of severe symptomatic aortic stenosis. Alternative approaches such as conventional aortic valve replacement, transcatheter aortic valve replacement, and palliative medical therapy were compared and contrasted at length.  The risks associated with conventional surgical aortic valve replacement were been discussed in detail, as were expectations for post-operative convalescence. Long-term prognosis with medical therapy was discussed. This discussion was placed in the context of the patient's own specific clinical  presentation and past medical history.  All of their questions been addressed.  We tentatively plan to proceed with transcatheter aortic valve replacement via transapical approach on Tuesday, 01/05/2015.  I favor obtaining TEE prior to surgery to r/o LA thrombus.  Following the decision to proceed with transcatheter aortic valve replacement, a discussion has been held regarding what types of management strategies would be attempted intraoperatively in the event of life-threatening complications, including whether or not the patient would be considered a candidate for the use of cardiopulmonary bypass and/or conversion to open sternotomy for attempted surgical intervention.  The patient has been advised of a variety of complications that might develop including but not limited to risks of death, stroke, paravalvular leak, aortic dissection or other major vascular complications, aortic annulus rupture, device embolization, cardiac rupture or perforation, mitral regurgitation, acute myocardial infarction, arrhythmia, heart block or bradycardia requiring permanent pacemaker placement, congestive heart failure, respiratory failure, renal failure, pneumonia, infection, other late complications related to structural valve deterioration or migration, or other complications that might ultimately cause a temporary or permanent loss of functional independence or other long term morbidity.  The patient provides full informed consent for the procedure as described and all questions were answered.   I spent in excess of 30 minutes during the conduct of this office consultation and >50% of this time involved direct face-to-face encounter with the patient for counseling and/or coordination of their care.   Valentina Gu. Roxy Manns, MD 12/30/2014 9:56 AM

## 2014-12-30 NOTE — Patient Instructions (Signed)
Patient has been instructed not to take Coumadin (warfarin)  Patient should continue taking all other medications without change through the day before surgery.  Patient should have nothing to eat or drink after midnight the night before surgery.  On the morning of surgery patient should take only metoprolol and Cardizem CD with a sip of water.

## 2014-12-30 NOTE — Progress Notes (Signed)
Patient states he has stopped coumadin Aug 30th per MD instructions.

## 2014-12-30 NOTE — Progress Notes (Signed)
   12/30/14 1414  OBSTRUCTIVE SLEEP APNEA  Have you ever been diagnosed with sleep apnea through a sleep study? No  Do you snore loudly (loud enough to be heard through closed doors)?  0  Do you often feel tired, fatigued, or sleepy during the daytime? 0  Has anyone observed you stop breathing during your sleep? 1  Do you have, or are you being treated for high blood pressure? 1  BMI more than 35 kg/m2? 0  Age over 75 years old? 1  Neck circumference greater than 40 cm/16 inches? 0 (15)  Gender: 1

## 2014-12-30 NOTE — Progress Notes (Signed)
Patient sees Fransico Him at [facility].    Patient has HD at [facility] Tues, Th, Sat.   ECHO: 12/03/14 EPIC  CATH: 12/16/14 EPIC  STRESS: Denies  EKG:12/18/14 EPIC (abn)

## 2014-12-31 ENCOUNTER — Other Ambulatory Visit: Payer: Self-pay | Admitting: Cardiovascular Disease

## 2014-12-31 ENCOUNTER — Other Ambulatory Visit (HOSPITAL_COMMUNITY): Payer: Self-pay

## 2014-12-31 ENCOUNTER — Encounter: Payer: Self-pay | Admitting: Cardiovascular Disease

## 2014-12-31 DIAGNOSIS — I482 Chronic atrial fibrillation, unspecified: Secondary | ICD-10-CM

## 2014-12-31 LAB — HEMOGLOBIN A1C
Hgb A1c MFr Bld: 5.5 % (ref 4.8–5.6)
Mean Plasma Glucose: 111 mg/dL

## 2014-12-31 NOTE — Progress Notes (Signed)
Discussed patient's case with Dr. Roxy Manns. He is scheduled for TAVR next week. His gated cardiac CT scan raised the question of left atrial appendage thrombus. We agree it is important to clarify whether this patient with known atrial fibrillation who has been on long-term warfarin now interrupted for surgery has left atrial appendage thrombus. If this is truly present, we would start him back on anticoagulation and reschedule his surgery after at least one month of therapeutic anticoagulation. Have recommended transesophageal echocardiogram to evaluate.  I have reviewed the risks, indications, and alternatives to this approach with the patient. He understands the risks of TEE and agrees to proceed. Scheduled with Dr. Aundra Dubin tomorrow at 1 PM. The patient will be nothing by mouth after midnight tonight.  [name] 12/31/2014 9:43 AM

## 2015-01-01 ENCOUNTER — Encounter: Payer: Self-pay | Admitting: Nurse Practitioner

## 2015-01-01 ENCOUNTER — Encounter (HOSPITAL_COMMUNITY)
Admission: RE | Disposition: A | Discharge status: Home / Self Care | Payer: Self-pay | Source: Ambulatory Visit | Attending: Cardiology

## 2015-01-01 ENCOUNTER — Encounter (HOSPITAL_COMMUNITY): Payer: Self-pay | Admitting: *Deleted

## 2015-01-01 ENCOUNTER — Ambulatory Visit (HOSPITAL_COMMUNITY)
Admission: RE | Admit: 2015-01-01 | Discharge: 2015-01-01 | Disposition: A | Discharge status: Home / Self Care | Payer: MEDICARE | Source: Ambulatory Visit | Attending: Cardiology | Admitting: Cardiology

## 2015-01-01 ENCOUNTER — Ambulatory Visit (HOSPITAL_COMMUNITY)
Admission: RE | Admit: 2015-01-01 | Discharge: 2015-01-01 | Disposition: A | Discharge status: Home / Self Care | Payer: MEDICARE | Source: Ambulatory Visit | Attending: Cardiovascular Disease | Admitting: Cardiovascular Disease

## 2015-01-01 ENCOUNTER — Telehealth: Payer: Self-pay | Admitting: Pharmacist

## 2015-01-01 DIAGNOSIS — I350 Nonrheumatic aortic (valve) stenosis: Secondary | ICD-10-CM | POA: Diagnosis not present

## 2015-01-01 DIAGNOSIS — I240 Acute coronary thrombosis not resulting in myocardial infarction: Secondary | ICD-10-CM | POA: Diagnosis present

## 2015-01-01 DIAGNOSIS — I482 Chronic atrial fibrillation, unspecified: Secondary | ICD-10-CM

## 2015-01-01 HISTORY — PX: TEE WITHOUT CARDIOVERSION: SHX5443

## 2015-01-01 SURGERY — ECHOCARDIOGRAM, TRANSESOPHAGEAL
Anesthesia: Moderate Sedation

## 2015-01-01 MED ORDER — BUTAMBEN-TETRACAINE-BENZOCAINE 2-2-14 % EX AERO
INHALATION_SPRAY | CUTANEOUS | Status: DC | PRN
  Administered 2015-01-01: 2 via TOPICAL

## 2015-01-01 MED ORDER — MIDAZOLAM HCL 10 MG/2ML IJ SOLN
INTRAMUSCULAR | Status: DC | PRN
  Administered 2015-01-01 (×2): 2 mg via INTRAVENOUS

## 2015-01-01 MED ORDER — FENTANYL CITRATE (PF) 100 MCG/2ML IJ SOLN
INTRAMUSCULAR | Status: AC
  Filled 2015-01-01: qty 2

## 2015-01-01 MED ORDER — SODIUM CHLORIDE 0.9 % IV SOLN
INTRAVENOUS | Status: DC
  Administered 2015-01-01: 12:00:00 via INTRAVENOUS

## 2015-01-01 MED ORDER — FENTANYL CITRATE (PF) 100 MCG/2ML IJ SOLN
INTRAMUSCULAR | Status: DC | PRN
  Administered 2015-01-01 (×2): 25 ug via INTRAVENOUS

## 2015-01-01 MED ORDER — MIDAZOLAM HCL 5 MG/ML IJ SOLN
INTRAMUSCULAR | Status: AC
  Filled 2015-01-01: qty 1

## 2015-01-01 MED ORDER — DIPHENHYDRAMINE HCL 50 MG/ML IJ SOLN
INTRAMUSCULAR | Status: AC
  Filled 2015-01-01: qty 1

## 2015-01-01 NOTE — H&P (View-Only) (Signed)
Discussed patient's case with Dr. Roxy Manns. He is scheduled for TAVR next week. His gated cardiac CT scan raised the question of left atrial appendage thrombus. We agree it is important to clarify whether this patient with known atrial fibrillation who has been on long-term warfarin now interrupted for surgery has left atrial appendage thrombus. If this is truly present, we would start him back on anticoagulation and reschedule his surgery after at least one month of therapeutic anticoagulation. Have recommended transesophageal echocardiogram to evaluate.  I have reviewed the risks, indications, and alternatives to this approach with the patient. He understands the risks of TEE and agrees to proceed. Scheduled with Dr. Aundra Dubin tomorrow at 1 PM. The patient will be nothing by mouth after midnight tonight.  [name] 12/31/2014 9:43 AM

## 2015-01-01 NOTE — Progress Notes (Signed)
  Echocardiogram 2D Echocardiogram has been performed.  Jennette Dubin 01/01/2015, 2:09 PM

## 2015-01-01 NOTE — Interval H&P Note (Signed)
History and Physical Interval Note:  01/01/2015 1:14 PM  [name]  has presented today for surgery, with the diagnosis of [name]  The various methods of treatment have been discussed with the patient and family. After consideration of risks, benefits and other options for treatment, the patient has consented to  Procedure(s): TRANSESOPHAGEAL ECHOCARDIOGRAM (TEE) (N/A) as a surgical intervention .  The patient's history has been reviewed, patient examined, no change in status, stable for surgery.  I have reviewed the patient's chart and labs.  Questions were answered to the patient's satisfaction.     Aamira Bischoff [employer]

## 2015-01-01 NOTE — CV Procedure (Signed)
Procedure: TEE  Indication: ?LAA thrombus  Sedation: Versed 4 mg IV, Fentanyl 50 mcg IV  Findings: Please see echo section for full report.  Normal LV size with EF 55%.  Mild concentric LV hypertrophy.  Normal wall motion.  Mildly dilated RV with normal systolic function.  Moderately calcified mitral valve and annulus with mild to moderate MR.  Mean gradient across the valve was 5 mmHg but MVA by pressure half time was within normal range, so doubt there is significant mitral stenosis.  Severe calcified and trileaflet aortic valve with severe stenosis, mean gradient 42 mmHg.  There was mild aortic insufficiency.  The left atrium was moderately dilated with a smoke + a small thrombus in the LA appendage.  The right atrium was mildly dilated. Normal caliber aorta with grade IV plaque in the descending thoracic aorta.   There is a LA appendage thrombus.  Will postpone TAVR.  Spoke with Dr Burt Knack, will have patient restart coumadin today and followup in coumadin clinic.   Loralie Champagne 01/01/2015 1:45 PM

## 2015-01-01 NOTE — Discharge Instructions (Signed)
Transesophageal Echocardiogram °Transesophageal echocardiography (TEE) is a picture test of your heart using sound waves. The pictures taken can give very detailed pictures of your heart. This can help your doctor see if there are problems with your heart. TEE can check: °· If your heart has blood clots in it. °· How well your heart valves are working. °· If you have an infection on the inside of your heart. °· Some of the major arteries of your heart. °· If your heart valve is working after a repair. °· Your heart before a procedure that uses a shock to your heart to get the rhythm back to normal. °BEFORE THE PROCEDURE °· Do not eat or drink for 6 hours before the procedure or as told by your doctor. °· Make plans to have someone drive you home after the procedure. Do not drive yourself home. °· An IV tube will be put in your arm. °PROCEDURE °· You will be given a medicine to help you relax (sedative). It will be given through the IV tube. °· A numbing medicine will be sprayed or gargled in the back of your throat to help numb it. °· The tip of the probe is placed into the back of your mouth. You will be asked to swallow. This helps to pass the probe into your esophagus. °· Once the tip of the probe is in the right place, your doctor can take pictures of your heart. °· You may feel pressure at the back of your throat. °AFTER THE PROCEDURE °· You will be taken to a recovery area so the sedative can wear off. °· Your throat may be sore and scratchy. This will go away slowly over time. °· You will go home when you are fully awake and able to swallow liquids. °· You should have someone stay with you for the next 24 hours. °· Do not drive or operate machinery for the next 24 hours. °Document Released: 02/12/2009 Document Revised: 04/22/2013 Document Reviewed: 10/17/2012 °ExitCare® Patient Information ©2015 [employer]. This information is not intended to replace advice given to you by your health care provider. Make  sure you discuss any questions you have with your health care provider. ° ° °Conscious Sedation, Adult, Care After °Refer to this sheet in the next few weeks. These instructions provide you with information on caring for yourself after your procedure. Your health care provider may also give you more specific instructions. Your treatment has been planned according to current medical practices, but problems sometimes occur. Call your health care provider if you have any problems or questions after your procedure. °WHAT TO EXPECT AFTER THE PROCEDURE  °After your procedure: °· You may feel sleepy, clumsy, and have poor balance for several hours. °· Vomiting may occur if you eat too soon after the procedure. °HOME CARE INSTRUCTIONS °· Do not participate in any activities where you could become injured for at least 24 hours. Do not: °¨ Drive. °¨ Swim. °¨ Ride a bicycle. °¨ Operate heavy machinery. °¨ Cook. °¨ Use power tools. °¨ Climb ladders. °¨ Work from a high place. °· Do not make important decisions or sign legal documents until you are improved. °· If you vomit, drink water, juice, or soup when you can drink without vomiting. Make sure you have little or no nausea before eating solid foods. °· Only take over-the-counter or prescription medicines for pain, discomfort, or fever as directed by your health care provider. °· Make sure you and your family fully understand everything about   the medicines given to you, including what side effects may occur. °· You should not drink alcohol, take sleeping pills, or take medicines that cause drowsiness for at least 24 hours. °· If you smoke, do not smoke without supervision. °· If you are feeling better, you may resume normal activities 24 hours after you were sedated. °· Keep all appointments with your health care provider. °SEEK MEDICAL CARE IF: °· Your skin is pale or bluish in color. °· You continue to feel nauseous or vomit. °· Your pain is getting worse and is not helped  by medicine. °· You have bleeding or swelling. °· You are still sleepy or feeling clumsy after 24 hours. °SEEK IMMEDIATE MEDICAL CARE IF: °· You develop a rash. °· You have difficulty breathing. °· You develop any type of allergic problem. °· You have a fever. °MAKE SURE YOU: °· Understand these instructions. °· Will watch your condition. °· Will get help right away if you are not doing well or get worse. °Document Released: 02/05/2013 Document Reviewed: 02/05/2013 °ExitCare® Patient Information ©2015 [employer]. This information is not intended to replace advice given to you by your health care provider. Make sure you discuss any questions you have with your health care provider. ° °

## 2015-01-01 NOTE — Telephone Encounter (Signed)
Received called from Short Stay at [facility].  Pt had a TEE today in preparation for TAVR.  Thrombus was found.  Pt's INR has been subtherapeutic since hospital discharge a week ago.  Plan to cancel TAVR and reschedule once INR has been therapeutic x 4 weeks.  Spoke with Judson Roch, RN in the recovery area.  Instructed patient to take 5mg  x 3 days then resume 2.5mg  daily.  Appt made to recheck INR in 1 week on 9/8 at 9am.  Pt's wife informed by Judson Roch, RN

## 2015-01-01 NOTE — Progress Notes (Signed)
Spoke with coumadin clinic regarding pts coumadin regimen. Patient will start on 5mg  Coumadin daily for 3 days then resume his normal 2.5 mg Coumadin dosage and repeat INR on Thursday 01/07/15 at [facility]. Pt and wife verbalized understanding. SmondayRn

## 2015-01-03 ENCOUNTER — Encounter (HOSPITAL_COMMUNITY): Payer: Self-pay | Admitting: Cardiology

## 2015-01-05 ENCOUNTER — Inpatient Hospital Stay (HOSPITAL_COMMUNITY)
Admission: RE | Admit: 2015-01-05 | Payer: MEDICARE | Source: Ambulatory Visit | Admitting: Thoracic Surgery (Cardiothoracic Vascular Surgery)

## 2015-01-05 ENCOUNTER — Other Ambulatory Visit: Payer: Self-pay | Admitting: *Deleted

## 2015-01-05 ENCOUNTER — Encounter (HOSPITAL_COMMUNITY): Admission: RE | Payer: Self-pay | Source: Ambulatory Visit

## 2015-01-05 SURGERY — REPLACEMENT, AORTIC VALVE, TRANSAPICAL APPROACH
Anesthesia: General | Site: Chest

## 2015-01-07 ENCOUNTER — Ambulatory Visit (INDEPENDENT_AMBULATORY_CARE_PROVIDER_SITE_OTHER): Payer: MEDICARE

## 2015-01-07 DIAGNOSIS — I4891 Unspecified atrial fibrillation: Secondary | ICD-10-CM

## 2015-01-07 LAB — POCT INR: INR: 1.5

## 2015-01-14 ENCOUNTER — Ambulatory Visit (INDEPENDENT_AMBULATORY_CARE_PROVIDER_SITE_OTHER): Payer: MEDICARE | Admitting: *Deleted

## 2015-01-14 DIAGNOSIS — I4891 Unspecified atrial fibrillation: Secondary | ICD-10-CM

## 2015-01-14 LAB — POCT INR: INR: 1.6

## 2015-01-21 ENCOUNTER — Encounter: Payer: Self-pay | Admitting: Cardiovascular Disease

## 2015-01-21 ENCOUNTER — Ambulatory Visit (INDEPENDENT_AMBULATORY_CARE_PROVIDER_SITE_OTHER): Payer: MEDICARE | Admitting: *Deleted

## 2015-01-21 ENCOUNTER — Ambulatory Visit (INDEPENDENT_AMBULATORY_CARE_PROVIDER_SITE_OTHER): Payer: MEDICARE | Admitting: Cardiovascular Disease

## 2015-01-21 VITALS — BP 142/94 | HR 57 | Ht 68.0 in | Wt 170.0 lb

## 2015-01-21 DIAGNOSIS — I350 Nonrheumatic aortic (valve) stenosis: Secondary | ICD-10-CM

## 2015-01-21 DIAGNOSIS — I4891 Unspecified atrial fibrillation: Secondary | ICD-10-CM

## 2015-01-21 DIAGNOSIS — I5032 Chronic diastolic (congestive) heart failure: Secondary | ICD-10-CM

## 2015-01-21 LAB — POCT INR: INR: 2.2

## 2015-01-21 NOTE — Patient Instructions (Signed)
TAVR PATIENT

## 2015-01-22 NOTE — Progress Notes (Signed)
Cardiology Office Note   Date:  01/22/2015   ID:  [name], [name] 1941-02-06, MRN [id number]  PCP:  [name] Pel, MD  Cardiologist:  [name] Mocha, MD    No chief complaint on file.    History of Present Illness: [name] is a 74 y.o. male who presents for further discussion regarding severe, Stage D, symptomatic aortic stenosis. The patient has been followed closely by the multidisciplinary heart valve team during and after recent admission for decompensated CHF. His multiple comorbid conditions include PAD, ESRD, chronic atrial fibrillation, and chronic diastolic CHF. 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 He is now anticoagulated again with warfarin and returns to discuss timing of TAVR.  He reports no change in symptoms. Still dyspneic with low-level activity. He had to rest 3 times while making his bed this morning. He denies orthopnea or PND. No chest pain. Edema is improved. He is tolerating hemodialysis. No bleeding problems. No stroke or TIA symptoms.      Past Medical History  Diagnosis Date  . Atrial fibrillation, persistent     a. cardioversion 11/21/10 . b. 05/2013 - experiencing more SOB. 24-hr holter showed avg HR 49, max 82, min 38bpm. ETT showed baseline junctional bradycardia, HR incr only to 90bpm with drop in BP. Saw EP - amiodarone reduced and eventually d/c'd with improvement in dyspnea.  . Hypertension   . Warfarin anticoagulation   . CKD (chronic kidney disease), stage V     a. Followed by Dr. Jimmy Footman. b. IV fistula placed 04/17/14.  [name] Aortic valve stenosis   . Orthostatic hypotension   . Diastolic dysfunction   . Arthritis   . Self-catheterizes urinary bladder   . Junctional bradycardia   . NSVT (nonsustained ventricular tachycardia)     a. By holter 01/2014.  [name] Shortness of  breath dyspnea   . PVD (peripheral vascular disease)   . Cardiomyopathy   . RTA (renal tubular acidosis)   . Hypomagnesemia   . Bladder cancer     a. 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  [name] A-V fistula   . CAD (coronary artery disease) 12/16/14    a. cath - nonobstrucive CAD 30% pro RCA; 25% dis RCA; 40T pro Lcx  . Heart murmur   . Dysrhythmia   . CHF (congestive heart failure)   . Pneumonia   . Full dentures   . Uses hearing aid   . Chronic diarrhea     takes imodium BID    Past Surgical History  Procedure Laterality Date  . Cardioversion  11/21/2010  . Cystectomy    . Cholecystectomy    . Cataract extraction w/ intraocular lens  implant, bilateral    . Colonoscopy    . Av fistula placement Left 04/17/2014    Procedure: ARTERIOVENOUS (AV) FISTULA CREATION- LEFT UPPER ARM ;  Surgeon: [name] [name], MD;  Location: [facility];  Service: Vascular;  Laterality: Left;  . Bladder surgery    . Vascular surgery    . Multiple tooth extractions    . Cardiac catheterization N/A 12/16/2014    Procedure: Right/Left Heart Cath and Coronary Angiography;  Surgeon: [name] Mocha, MD;  Location: [facility];  Service: Cardiovascular;  Laterality: N/A;  . Eye surgery    . Colon surgery  Nov 2002  reconstructed bladder from colon. per pt. at [facility]  . Tee without cardioversion N/A 01/01/2015    Procedure: TRANSESOPHAGEAL ECHOCARDIOGRAM (TEE);  Surgeon: [name] Dresser, MD;  Location: [facility] ENDOSCOPY;  Service: Cardiovascular;  Laterality: N/A;    Current Outpatient Prescriptions  Medication Sig Dispense Refill  . acetaminophen (TYLENOL) 500 MG tablet Take 1,000 mg by mouth every 6 (six) hours as needed for mild pain or moderate pain.    . calcitRIOL (ROCALTROL) 0.25 MCG capsule Take 0.25 mcg by mouth as needed.     . diltiazem (CARDIZEM CD) 180 MG 24 hr capsule Take 180 mg by mouth at bedtime.    . Hypromellose (ARTIFICIAL TEARS OP) Place 1 drop into both eyes daily as  needed (rinse eyes).     . Loperamide HCl (IMODIUM PO) Take 1 tablet by mouth 2 (two) times daily.    . metoprolol tartrate (LOPRESSOR) 25 MG tablet Take 75 mg by mouth every evening.     . Multiple Vitamins-Minerals (CENTRUM SILVER ULTRA MENS PO) Take 1 tablet by mouth daily. Take daily    . Pancrelipase, Lip-Prot-Amyl, (CREON) 24000 UNITS CPEP Take 48,000 Units by mouth 3 (three) times daily with meals.     . sevelamer carbonate (RENVELA) 800 MG tablet 1,600 mg 3 (three) times daily with meals.     . warfarin (COUMADIN) 5 MG tablet Take 0.5 tablets (2.5 mg total) by mouth daily. Take as directed by coumadin clinic (Patient taking differently: Take 2.5 mg by mouth daily. [name]) 90 tablet 1   No current facility-administered medications for this visit.    Allergies:   Amiodarone and Oxycodone hcl   Social History:  The patient  reports that he quit smoking about 31 years ago. His smoking use included Cigarettes. He has a 66 pack-year smoking history. He has never used smokeless tobacco. He reports that he drinks alcohol. He reports that he does not use illicit drugs.   Family History:  The patient's  family history includes Alcohol abuse in his father; Alzheimer's disease in his sister; Arrhythmia in his father; CVA in his mother; Heart attack in his father; Heart disease in his father; Multiple sclerosis in his brother.    ROS:  Please see the history of present illness.  Otherwise, review of systems is positive for hearing loss, cough, back pain, easy bruising, irregular heart beats.  All other systems are reviewed and negative.    PHYSICAL EXAM: VS:  BP 142/94 mmHg  Pulse 57  Ht 5\' 8"  (1.727 m)  Wt 170 lb (77.111 kg)  BMI 25.85 kg/m2 , BMI Body mass index is 25.85 kg/(m^2). GEN: Well nourished, well developed, pleasant elderly male in no acute distress HEENT: normal Neck: no JVD, no masses.  Cardiac: irregular with grade 4/6 harsh systolic murmur at the RUSB               Respiratory:   clear to auscultation bilaterally, normal work of breathing GI: soft, nontender, nondistended, + BS MS: no deformity or atrophy Ext: trace pretibial edema Skin: warm and dry, no rash Neuro:  Strength and sensation are intact Psych: euthymic mood, full affect  EKG:  EKG is not ordered today.  Recent Labs: 04/28/2014: TSH 2.46 12/09/2014: Pro B Natriuretic peptide (BNP) 3135.0* 12/10/2014: B Natriuretic Peptide 3973.1* 12/11/2014: Magnesium 2.5* 12/30/2014: ALT 25; BUN 36*; Creatinine, Ser 3.99*; Hemoglobin 12.2*; Platelets 187; Potassium 3.9; Sodium 138   Lipid Panel  No results found for: CHOL, TRIG, HDL, CHOLHDL, VLDL, LDLCALC, LDLDIRECT  Wt Readings from Last 3 Encounters:  01/21/15 170 lb (77.111 kg)  12/30/14 170 lb 9.6 oz (77.384 kg)  12/30/14 164 lb (74.39 kg)     Cardiac Studies Reviewed: 2D Echo: Study Conclusions  - Left ventricle: There was moderate concentric hypertrophy. The estimated ejection fraction was 55%. - Aortic valve: There was severe stenosis. There was mild regurgitation. - Mitral valve: Severely calcified annulus. There was mild to moderate regurgitation. Valve area by continuity equation (using LVOT flow): 1.25 cm^2. - Left atrium: The atrium was severely dilated. - Right atrium: The atrium was mildly dilated. - Atrial septum: No defect or patent foramen ovale was identified.  TEE: Findings: Please see echo section for full report. Normal LV size with EF 55%. Mild concentric LV hypertrophy. Normal wall motion. Mildly dilated RV with normal systolic function. Moderately calcified mitral valve and annulus with mild to moderate MR. Mean gradient across the valve was 5 mmHg but MVA by pressure half time was within normal range, so doubt there is significant mitral stenosis. Severe calcified and trileaflet aortic valve with severe stenosis, mean gradient 42 mmHg. There was mild aortic insufficiency. The left atrium was moderately dilated  with a smoke + a small thrombus in the LA appendage. The right atrium was mildly dilated. Normal caliber aorta with grade IV plaque in the descending thoracic aorta.   There is a LA appendage thrombus. Will postpone TAVR. Spoke with Dr Burt Knack, will have patient restart coumadin today and followup in coumadin clinic.   Cardiac CTA: FINDINGS: Aortic Valve: Severe leaflet calcification with severely restricted leaflet opening. There is significant asymmetric calcification under the non-coronary leaflet extending into the LVOT that continues into mitral annulus.  Aorta: Normal caliber. Moderate diffuse calcifications in the entire thoracic aorta. No dissection.  Sinotubular Junction: 31 x 29 mm  Ascending Thoracic Aorta: 36 x 35 mm  Aortic Arch: 27 x 27 mm  Descending Thoracic Aorta: 30 x 30 mm  Sinus of Valsalva Measurements:  Non-coronary: 32 mm  Right -coronary: 33 mm  Left -coronary: 36 mm  Coronary Artery Height above Annulus:  Left Main: 11 mm  Right Coronary: 12 mm  Virtual Basal Annulus Measurements:  Maximum/Minimum Diameter: 30 x 23 mm  Perimeter: 97 mm  Area: 560 mm2  Optimum Fluoroscopic Angle for Delivery: LAO 11 CAU 7  Other findings: Dilated pulmonary artery (41 x 37 mm) suggestive of pulmonary hypertension.  Normal pulmonary vein drainage.  Moderate concentric LVH.  Severely dilated left atrium.  There is a filling defect in the left atrial appendage that might represent incomplete contrast penetration vs a small thrombus.  IMPRESSION: 1. Severe leaflet calcification with severely restricted leaflet opening and annular measurement suitable for delivery of 29 mm Edward SAPIEN 3 valve.  There is significant asymmetric calcification located under the non-coronary leaflet extending into the LVOT.  2. Sufficient coronary to annulus distance.  3. Optimum Fluoroscopic Angle for Delivery: LAO 11 CAU  7  4. There is a filling defect in the left atrial appendage that might represent incomplete contrast penetration vs a small thrombus.  ASSESSMENT AND PLAN: 74 yo male with ESRD, chronic diastolic CHF with NYHA III symptoms, and Stage D severe symptomatic aortic stenosis. We have discussed all issues around timing of TAVR as it relates to his recently diagnosed LAA thrombus. Today is his first therapeutic INR (2.2). He is very eager to proceed. As long as his INR remains in the therapeutic range, we will tentatively plan to proceed with  TAVR on October 18th which would give him 4 weeks of therapeutic anticoagulation. He will undergo TEE during the TAVR procedure and his LAA will be carefully evaluated before proceeding with aortic valve replacement. Will need to discuss bridging with Dr Roxy Manns since his case will be done transapically. Options would be to hold warfarin 5 days prior to surgery and proceed without bridging versus use of reduced dose Lovenox (once daily) in the setting of ESRD. The patient will be seen by Dr Roxy Manns in the next few weeks to discuss complete details of transapical TAVR. Overall he appears clinically stable and ready to proceed with TAVR after a period of therapeutic anticoagulation.  All of his questions were answered today.  Current medicines are reviewed with the patient today.  The patient does not have concerns regarding medicines.  Labs/ tests ordered today include:  No orders of the defined types were placed in this encounter.    Disposition:   FU as outlined  Signed, [name] Mocha, MD  01/22/2015 2:33 PM    [address], [place], [place]  [address] Phone: [phone]; Fax: [phone]

## 2015-01-25 ENCOUNTER — Other Ambulatory Visit: Payer: Self-pay | Admitting: *Deleted

## 2015-01-25 DIAGNOSIS — I350 Nonrheumatic aortic (valve) stenosis: Secondary | ICD-10-CM

## 2015-01-28 ENCOUNTER — Ambulatory Visit (INDEPENDENT_AMBULATORY_CARE_PROVIDER_SITE_OTHER): Payer: MEDICARE | Admitting: *Deleted

## 2015-01-28 DIAGNOSIS — I4891 Unspecified atrial fibrillation: Secondary | ICD-10-CM

## 2015-01-28 LAB — POCT INR: INR: 1.8

## 2015-02-04 ENCOUNTER — Ambulatory Visit (INDEPENDENT_AMBULATORY_CARE_PROVIDER_SITE_OTHER): Payer: MEDICARE | Admitting: Pharmacist

## 2015-02-04 DIAGNOSIS — I4891 Unspecified atrial fibrillation: Secondary | ICD-10-CM | POA: Diagnosis not present

## 2015-02-04 LAB — POCT INR: INR: 2.5

## 2015-02-08 ENCOUNTER — Encounter: Payer: Self-pay | Admitting: Thoracic Surgery (Cardiothoracic Vascular Surgery)

## 2015-02-08 ENCOUNTER — Ambulatory Visit (INDEPENDENT_AMBULATORY_CARE_PROVIDER_SITE_OTHER): Payer: MEDICARE | Admitting: Thoracic Surgery (Cardiothoracic Vascular Surgery)

## 2015-02-08 VITALS — BP 129/84 | HR 53 | Resp 16 | Ht 68.0 in | Wt 163.0 lb

## 2015-02-08 DIAGNOSIS — I5032 Chronic diastolic (congestive) heart failure: Secondary | ICD-10-CM

## 2015-02-08 DIAGNOSIS — I350 Nonrheumatic aortic (valve) stenosis: Secondary | ICD-10-CM | POA: Diagnosis not present

## 2015-02-08 NOTE — Progress Notes (Signed)
[facility]  MULTIDISCIPLINARY HEART VALVE CLINIC    CARDIOTHORACIC SURGERY HISTORY AND PHYSICAL EXAMINATION  Referring Provider is [name], MD  Primary Cardiologist is TURNER, Eber Hong, MD PCP is [name], MD  Chief Complaint  Patient presents with  . 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  The patient was originally seen in consultation on 12/15/2014 at which time the patient was hospitalized for an acute exacerbation of chronic diastolic congestive heart failure.  He was evaluated by a multidisciplinary team of specialists during that hospitalization and diagnosed with stage D severe symptomatic aortic stenosis. He is felt to be a poor candidate for conventional surgical aortic valve replacement because of his advanced age and numerous comorbid medical problems including dialysis-dependent renal failure. In addition, the patient has moderate to severe calcification of the entire ascending thoracic aorta. Plans were made for transcatheter aortic valve replacement as a far less invasive and potentially less risky alternative to extreme risk conventional surgery.  During his workup he was found to have thrombus in the left atrial appendage. The patient was subsequently anticoagulated using warfarin and plans for TAVR were postponed. The patient has been maintained on warfarin ever since and was seen recently in follow-up by Dr. Burt Knack. The patient returns to the  office today with hopes to proceed with TAVR next week. He reports no new problems or complaints over the last few weeks. He continues to experience exertional shortness of breath and fatigue with very low level physical activity, consistent with chronic diastolic congestive heart failure New York Heart Association functional class III. He denies any symptoms of resting shortness of breath, PND, orthopnea, dizzy spells, or syncope. His breathing has been somewhat improved and chest tightness has resolved since he began dialysis therapy. He has not been having any particular problems with dialysis treatments lately. The remainder of his review of systems is unchanged from previously.   Past Medical History  Diagnosis Date  . Atrial fibrillation, persistent ([place])     a. cardioversion 11/21/10 . b. 05/2013 - experiencing more SOB. 24-hr holter showed avg HR 49, max 82, min 38bpm. ETT showed baseline junctional bradycardia, HR incr only to 90bpm with drop in BP. Saw EP - amiodarone reduced and eventually d/c'd with improvement in dyspnea.  . Hypertension   . Warfarin anticoagulation   . CKD (chronic kidney disease), stage V ([place])     a. Followed by Dr. Jimmy Footman. b. IV fistula placed 04/17/14.  [name] Aortic valve stenosis   . Orthostatic hypotension   . Diastolic dysfunction   . Arthritis   . Self-catheterizes urinary bladder   . Junctional bradycardia   . NSVT (nonsustained ventricular tachycardia) ([place])     a. By holter 01/2014.  [name] Shortness of breath dyspnea   . PVD (peripheral vascular disease) ([place])   . Cardiomyopathy ([place])   . RTA (renal tubular acidosis)   . Hypomagnesemia   . Bladder cancer ([place])     a. 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  [name] A-V fistula ([place])   . CAD (  coronary artery disease) 12/16/14    a. cath - nonobstrucive CAD 30% pro RCA; 25% dis RCA; 40T pro Lcx  . Heart murmur   . Dysrhythmia   . CHF (congestive heart failure) ([place])   . Pneumonia   .  Full dentures   . Uses hearing aid   . Chronic diarrhea     takes imodium BID    Past Surgical History  Procedure Laterality Date  . Cardioversion  11/21/2010  . Cystectomy    . Cholecystectomy    . Cataract extraction w/ intraocular lens  implant, bilateral    . Colonoscopy    . Av fistula placement Left 04/17/2014    Procedure: ARTERIOVENOUS (AV) FISTULA CREATION- LEFT UPPER ARM ;  Surgeon: Mal [name], MD;  Location: [facility];  Service: Vascular;  Laterality: Left;  . Bladder surgery    . Vascular surgery    . Multiple tooth extractions    . Cardiac catheterization N/A 12/16/2014    Procedure: Right/Left Heart Cath and Coronary Angiography;  Surgeon: [name], MD;  Location: [facility];  Service: Cardiovascular;  Laterality: N/A;  . Eye surgery    . Colon surgery  Nov 2002    reconstructed bladder from colon. per pt. at [facility]  . Tee without cardioversion N/A 01/01/2015    Procedure: TRANSESOPHAGEAL ECHOCARDIOGRAM (TEE);  Surgeon: [name], MD;  Location: [facility] ENDOSCOPY;  Service: Cardiovascular;  Laterality: N/A;    Family History  Problem Relation Age of Onset  . CVA Mother   . Heart attack Father   . Heart disease Father   . Arrhythmia Father   . Alzheimer's disease Sister   . Multiple sclerosis Brother   . Alcohol abuse Father     Social History   Social History  . Marital Status: Married    Spouse Name: N/A  . Number of Children: N/A  . Years of Education: N/A   Occupational History  . Not on file.   Social History Main Topics  . Smoking status: Former Smoker -- 2.00 packs/day for 33 years    Types: Cigarettes    Quit date: 05/02/1983  . Smokeless tobacco: Never Used  . Alcohol Use: Yes     Comment: rare  . Drug Use: No  . Sexual Activity: Not on file   Other Topics Concern  . Not on file   Social History Narrative    Current Outpatient Prescriptions  Medication Sig Dispense Refill  . acetaminophen (TYLENOL) 500 MG tablet Take  1,000 mg by mouth every 6 (six) hours as needed for mild pain or moderate pain.    . calcitRIOL (ROCALTROL) 0.25 MCG capsule Take 0.25 mcg by mouth as needed.     . diltiazem (CARDIZEM CD) 180 MG 24 hr capsule Take 180 mg by mouth at bedtime.    . Hypromellose (ARTIFICIAL TEARS OP) Place 1 drop into both eyes daily as needed (rinse eyes).     . Loperamide HCl (IMODIUM PO) Take 1 tablet by mouth 2 (two) times daily.    . metoprolol tartrate (LOPRESSOR) 25 MG tablet Take 75 mg by mouth every evening.     . Multiple Vitamins-Minerals (CENTRUM SILVER ULTRA MENS PO) Take 1 tablet by mouth daily. Take daily    . Pancrelipase, Lip-Prot-Amyl, (CREON) 24000 UNITS CPEP Take 48,000 Units by mouth 2 (two) times daily with a meal.     . sevelamer carbonate (RENVELA) 800 MG tablet 1,600 mg 3 (three) times daily with meals.     [name]  warfarin (COUMADIN) 5 MG tablet Take 0.5 tablets (2.5 mg total) by mouth daily. Take as directed by coumadin clinic (Patient taking differently: Take 2.5 mg by mouth daily. [name]) 90 tablet 1   No current facility-administered medications for this visit.    Allergies  Allergen Reactions  . Amiodarone Other (See Comments)    Severely reduced DLCO  . Oxycodone Hcl Rash and Other (See Comments)    Rash and itching    Review of Systems:  General:decreased appetite, decreased energy, + weight gain, no weight loss, no fever Cardiac:no chest pain with exertion, no chest pain at rest, + SOB with exertion, + occasional resting SOB, no PND, + orthopnea, + palpitations, + arrhythmia, + atrial fibrillation, + LE edema, + dizzy spells, NO syncope Respiratory:+ shortness of breath, no home oxygen, no productive cough, + chronic dry cough, no bronchitis, no wheezing, no hemoptysis, no asthma, no pain with inspiration or cough, no sleep apnea, no CPAP at  night MG:QQPY difficulty swallowing, no reflux, no frequent heartburn, no hiatal hernia, no abdominal pain, no constipation, + chronic diarrhea, no hematochezia, no hematemesis, no melena PP:[id number] for urine s/p radical cystectomy, no dysuria, + frequency, no urinary tract infection, no hematuria, *no enlarged prostate, no kidney stones, + kidney disease Vascular:no pain suggestive of claudication, no pain in feet, no leg cramps, no varicose veins, no DVT, + non-healing foot ulcer, right lower leg Neuro:no stroke, no TIA's, no seizures, no headaches, no temporary blindness one eye, no slurred speech, no peripheral neuropathy, no chronic pain, no instability of gait, no memory/cognitive dysfunction Musculoskeletal:mild arthritis - primarily involving the hands, no joint swelling, no myalgias, no difficulty walking, normal mobility  Skin:no rash, no itching, no skin infections, + pressure sores or ulcerations, + chronic skin changes from warfarin Psych:no anxiety, no depression, no nervousness, no unusual recent stress Eyes:+ blurry vision, no floaters, + recent vision changes, + wears glasses or contacts ENT:no hearing loss, edentulous w/ full set dentures, last saw dentist several years ago Hematologic:+ easy bruising, + abnormal bleeding, no clotting disorder, no frequent epistaxis, no complications w/ long term warfarin Endocrine:no diabetes, does not check CBG's at home   Physical Exam:   BP 129/84 mmHg  Pulse 53  Resp 16  Ht 5\' 8"  (1.727 m)  Wt 163 lb (73.936 kg)  BMI  24.79 kg/m2  SpO2 96% General:elderly-appearing HEENT:Unremarkable  Neck:no JVD, no bruits, no adenopathy  Chest:clear to auscultation, symmetrical breath sounds, no wheezes, no rhonchi  CV:RRR, grade III/VI harsh systolic murmur heard all across precordium Abdomen:soft, non-tender, no masses  Extremities:warm, well-perfused, pulses diminished, + lower extremity edema, + ulceration right lower leg Rectal/GUDeferred Neuro:Grossly non-focal and symmetrical throughout Skin:Clean and dry, no rashes, no breakdown    Diagnostic Tests:  Echocardiography  Patient:  [name], [name] MR #:    [id number] Study Date: 12/03/2014 Gender:   M Age:    14 Height:   172.7 cm Weight:   78.5 kg BSA:    1.95 m^2 Pt. Status: Room:  ATTENDING  Fransico Him, MD ORDERING   Fransico Him, MD REFERRING  Fransico Him, MD SONOGRAPHER Charlann Noss, RDCS PERFORMING  [facility], Outpatient  cc:  ------------------------------------------------------------------- LV EF: 55%  ------------------------------------------------------------------- Indications:   Cardiomyopathy.  ------------------------------------------------------------------- History:  PMH: Acquired from the patient and from the patient&'s chart. Atrial fibrillation. Cardiomyopathy. Aortic valve disease. Chronic kidney disease. Risk factors: Hypertension.  ------------------------------------------------------------------- Study Conclusions  - Left ventricle: There was moderate concentric hypertrophy. The estimated ejection fraction was  55%. -  Aortic valve: There was severe stenosis. There was mild regurgitation. - Mitral valve: Severely calcified annulus. There was mild to moderate regurgitation. Valve area by continuity equation (using LVOT flow): 1.25 cm^2. - Left atrium: The atrium was severely dilated. - Right atrium: The atrium was mildly dilated. - Atrial septum: No defect or patent foramen ovale was identified.  ------------------------------------------------------------------- Labs, prior tests, procedures, and surgery: Echocardiography (February 2016).  Echocardiography. M-mode, complete 2D, spectral Doppler, and color Doppler. Birthdate: Patient birthdate: [birth year]. Age: Patient is 74 yr old. Sex: Gender: male.  BMI: 26.3 kg/m^2. Blood pressure:   145/86 Patient status: Outpatient. Study date: Study date: 12/03/2014. Study time: 09:54 AM. Location: [facility]  -------------------------------------------------------------------  ------------------------------------------------------------------- Left ventricle: There was moderate concentric hypertrophy. The estimated ejection fraction was 55%.  ------------------------------------------------------------------- Aortic valve:  Moderately calcified leaflets. Doppler:  There was severe stenosis.  There was mild regurgitation.  VTI ratio of LVOT to aortic valve: 0.19. Valve area (VTI): 0.47 cm^2. Indexed valve area (VTI): 0.24 cm^2/m^2. Mean velocity ratio of LVOT to aortic valve: 0.18. Valve area (Vmean): 0.45 cm^2. Indexed valve area (Vmean): 0.23 cm^2/m^2.  Mean gradient (S): 44 mm Hg. Peak gradient (S): 87 mm Hg.  ------------------------------------------------------------------- Aorta: Ascending aorta: The ascending aorta was mildly dilated.  ------------------------------------------------------------------- Mitral valve:  Severely calcified annulus. Doppler: There was mild to moderate regurgitation.   Valve area by continuity equation (using LVOT flow): 1.25 cm^2. Indexed valve area by continuity equation (using LVOT flow): 0.64 cm^2/m^2.  Mean gradient (D): 4 mm Hg.  ------------------------------------------------------------------- Left atrium: The atrium was severely dilated.  ------------------------------------------------------------------- Atrial septum: No defect or patent foramen ovale was identified.  ------------------------------------------------------------------- Right ventricle: The cavity size was normal. Wall thickness was normal. Systolic function was normal.  ------------------------------------------------------------------- Pulmonic valve:  Doppler: There was mild regurgitation.  ------------------------------------------------------------------- Tricuspid valve:  Doppler: There was mild regurgitation.  ------------------------------------------------------------------- Right atrium: The atrium was mildly dilated.  ------------------------------------------------------------------- Pericardium: The pericardium was normal in appearance.  ------------------------------------------------------------------- Measurements  Left ventricle             Value       Reference LV ID, ED, PLAX chordal        51  mm     43 - 52 LV ID, ES, PLAX chordal        32  mm     23 - 38 LV fx shortening, PLAX chordal     37  %      >=29 LV PW thickness, ED          14  mm     --------- IVS/LV PW ratio, ED          0.93       <=1.3 Stroke volume, 2D           51  ml     --------- Stroke volume/bsa, 2D         26  ml/m^2   ---------  Ventricular septum           Value       Reference IVS thickness, ED           13  mm     ---------  LVOT                  Value        Reference LVOT ID, S               18  mm     --------- LVOT area               2.54 cm^2    --------- LVOT ID                18  mm     --------- LVOT mean velocity, S         52.4 cm/s    --------- LVOT VTI, S              20.2 cm     --------- Stroke volume (SV), LVOT DP      51.4 ml     --------- Cardiac output (Qs), LVOT DP      3.9  L/min    --------- Cardiac index (Qs/bsa), LVOT      2   L/(min-m^2) --------- DP Stroke index (SV/bsa), LVOT DP     26.3 ml/m^2   ---------  Aortic valve              Value       Reference Aortic valve peak velocity, S     466  cm/s    --------- Aortic valve mean velocity, S     298  cm/s    --------- Aortic valve VTI, S          109  cm     --------- Aortic mean gradient, S        44  mm Hg    --------- Aortic peak gradient, S        87  mm Hg    --------- VTI ratio, LVOT/AV           0.19       --------- Aortic valve area, VTI         0.47 cm^2    --------- Aortic valve area/bsa, VTI       0.24 cm^2/m^2  --------- Velocity ratio, mean, LVOT/AV     0.18       --------- Aortic valve area, mean        0.45 cm^2    --------- velocity Aortic valve area/bsa, mean      0.23 cm^2/m^2  --------- velocity Aortic regurg pressure         489  ms     --------- half-time  Aorta                 Value       Reference Aortic root ID, ED           39  mm     ---------  Left atrium              Value       Reference LA ID, A-P, ES             56  mm     --------- LA ID/bsa, A-P         (H)   2.87 cm/m^2   <=2.2 LA volume, S               238  ml     --------- LA volume/bsa, S            121.8 ml/m^2   --------- LA volume, ES, 1-p A4C         189  ml     --------- LA volume/bsa, ES, 1-p A4C       96.7 ml/m^2   --------- LA volume, ES, 1-p A2C  282  ml     --------- LA volume/bsa, ES, 1-p A2C       144.3 ml/m^2   ---------  Mitral valve              Value       Reference Mitral mean velocity, D        91.2 cm/s    --------- Mitral mean gradient, D        4   mm Hg    --------- Mitral valve area, LVOT        1.25 cm^2    --------- continuity Mitral valve area/bsa, LVOT      0.64 cm^2/m^2  --------- continuity Mitral annulus VTI, D         41.1 cm     ---------  Systemic veins             Value       Reference Estimated CVP             3   mm Hg    ---------  Right ventricle            Value       Reference RV s&', lateral, S           8.38 cm/s    ---------  Legend: (L) and (H) mark values outside specified reference range.  ------------------------------------------------------------------- Prepared and Electronically Authenticated by  Jenkins Rouge, M.D. 2016-08-04T11:00:22             CARDIAC CATHETERIZATION  Procedures    Right/Left Heart Cath and Coronary Angiography    Conclusion     Prox RCA lesion, 30% stenosed.  Dist RCA lesion, 25% stenosed.  Prox Cx lesion, 40% stenosed.  Assessment: 1. Calcified but nonobstructive coronary artery disease 2. 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 Dense calcification of the aortic valve annulus extending into the mitral valve annulus, and at least moderate calcification of the ascending aorta.  Plan: Continued evaluation by the Multidisciplinary Valve Team for TAVR versus  conventional AVR     Indications    Aortic stenosis [I35.0 (ICD-10-CM)]    Technique and Indications    INDICATION: Severe symptomatic aortic stenosis  PROCEDURAL DETAILS: The right groin was prepped, draped, and anesthetized with 1% lidocaine. Using the modified Seldinger technique a 5 French sheath was placed in the right femoral artery and a 7 French sheath was placed in the right femoral vein. A Swan-Ganz catheter was used for the right heart catheterization. Standard protocol was followed for recording of right heart pressures and sampling of oxygen saturations. Fick cardiac output was calculated. Standard Judkins catheters were used for selective coronary angiography. The aortic valve was not crossed. There was a significant amount of bleeding around the arterial sheath throughout the procedure. Manual pressure was used for hemostasis even during the procedure. Immediately at the completion of the procedure, the arterial sheath was pulled on the table and manual pressure was used to achieve hemostasis. There was no evidence of a large or firm hematoma once the procedure was completed. There were no immediate procedural complications. The patient was transferred to the post catheterization recovery area for further monitoring.  ESTIMATED BLOOD LOSS: approximately 100 mlThere were no immediate complications during the procedure.    Coronary Findings    Dominance: Right   Left Main  The vessel is angiographically normal.     Left Anterior Descending  The vessel exhibits minimal luminal irregularities.     Left  Circumflex   . Prox Cx lesion, 40% stenosed. calcified .     Right Coronary Artery  There is mild the vessel.   . Prox RCA lesion, 30% stenosed. diffuse .   [name] Dist RCA lesion, 25% stenosed.      Left Heart    Mitral Valve The annulus is calcified. There is severe mitral annular calcification with a dense bar of calcium visualized from the aortic valve annulus  to the mitral valve annulus   Aortic Valve The aortic valve is calcified. There is restricted aortic valve motion.   Aorta There is heavy calcification of the ascending aorta    Coronary Diagrams    Diagnostic Diagram            Implants    Name ID Temporary Type Supply   No information to display    PACS Images    Show images for Cardiac catheterization     Link to Procedure Log    Procedure Log      Hemo Data       Most Recent Value   Fick Cardiac Output  6.78 L/min   Fick Cardiac Output Index  3.51 (L/min)/BSA   RA A Wave  -99 mmHg   RA V Wave  7 mmHg   RA Mean  5 mmHg   RV Systolic Pressure  60 mmHg   RV Diastolic Pressure  1 mmHg   RV EDP  9 mmHg   PA Systolic Pressure  50 mmHg   PA Diastolic Pressure  22 mmHg   PA Mean  34 mmHg   PW A Wave  -99 mmHg   PW V Wave  21 mmHg   PW Mean  19 mmHg   AO Systolic Pressure  371 mmHg   AO Diastolic Pressure  74 mmHg   AO Mean  100 mmHg   QP/QS  1   TPVR Index  9.68 HRUI   TSVR Index  29.04 HRUI   PVR SVR Ratio  0.15   TPVR/TSVR Ratio  0.33    Cardiac TAVR CT  TECHNIQUE: The patient was scanned on a Philips 256 scanner. A 120 kV retrospective scan was triggered in the descending thoracic aorta at 111 HU's. Gantry rotation speed was 270 msecs and collimation was .9 mm. 5 mg of iv Metoprolol and no nitro were given. The 3D data set was reconstructed in 5% intervals of the R-R cycle. Systolic and diastolic phases were analyzed on a dedicated work station using MPR, MIP and VRT modes. The patient received 80 cc of contrast.  FINDINGS: Aortic Valve: Severe leaflet calcification with severely restricted leaflet opening. There is significant asymmetric calcification under the non-coronary leaflet extending into the LVOT that continues into mitral annulus.  Aorta: Normal caliber. Moderate diffuse calcifications in the entire thoracic aorta. No dissection.  Sinotubular  Junction: 31 x 29 mm  Ascending Thoracic Aorta: 36 x 35 mm  Aortic Arch: 27 x 27 mm  Descending Thoracic Aorta: 30 x 30 mm  Sinus of Valsalva Measurements:  Non-coronary: 32 mm  Right -coronary: 33 mm  Left -coronary: 36 mm  Coronary Artery Height above Annulus:  Left Main: 11 mm  Right Coronary: 12 mm  Virtual Basal Annulus Measurements:  Maximum/Minimum Diameter: 30 x 23 mm  Perimeter: 97 mm  Area: 560 mm2  Optimum Fluoroscopic Angle for Delivery: LAO 11 CAU 7  Other findings: Dilated pulmonary artery (41 x 37 mm) suggestive of pulmonary hypertension.  Normal pulmonary vein drainage.  Moderate concentric LVH.  Severely dilated left atrium.  There is a filling defect in the left atrial appendage that might represent incomplete contrast penetration vs a small thrombus.  IMPRESSION: 1. Severe leaflet calcification with severely restricted leaflet opening and annular measurement suitable for delivery of 29 mm Edward SAPIEN 3 valve.  There is significant asymmetric calcification located under the non-coronary leaflet extending into the LVOT.  2. Sufficient coronary to annulus distance.  3. Optimum Fluoroscopic Angle for Delivery: LAO 11 CAU 7  4. There is a filling defect in the left atrial appendage that might represent incomplete contrast penetration vs a small thrombus.  Ena Dawley   Electronically Signed  By: Ena Dawley  On: 12/18/2014 18:37   CT ANGIOGRAPHY CHEST WITH CONTRAST  TECHNIQUE: Multidetector CT imaging of the chest was performed using the standard protocol during bolus administration of intravenous contrast. Multiplanar CT image reconstructions and MIPs were obtained to evaluate the vascular anatomy.  CONTRAST: 54mL OMNIPAQUE IOHEXOL 350 MG/ML SOLN  COMPARISON: Chest CT 06/11/2014. CT the abdomen and pelvis 05/17/2012.  FINDINGS: CTA CHEST  FINDINGS  Mediastinum/Lymph Nodes: Heart size is enlarged with left atrial dilatation. Filling defect in the tip of the left atrial appendage, concerning for left atrial appendage thrombus. There is no significant pericardial fluid, thickening or pericardial calcification. There is atherosclerosis of the thoracic aorta, the great vessels of the mediastinum and the coronary arteries, including calcified atherosclerotic plaque in the left main, left anterior descending, left circumflex and right coronary arteries. Severe thickening calcifications of the aortic valve. Profound calcification of the mitral-aortic intervalvular fibrosa. Severe thickening of the mitral valve and mitral annulus. Dilatation of the pulmonic trunk up to 4.2 cm in diameter, suggesting pulmonary arterial hypertension. Multiple prominent borderline enlarged and minimally enlarged mediastinal and bilateral hilar lymph nodes measuring up to 1 cm in short axis in the right hilum and 1.2 cm in short axis in the left hilum, similar to the prior examination from 06/11/2014. Esophagus is unremarkable in appearance. No axillary lymphadenopathy. Right subclavian artery is moderately tortuous and mildly calcified with minimum dimension of 9.6 x 9.1 mm proximally and 8.8 x 8.9 mm distally. Left subclavian artery is moderately tortuous and mildly calcified with a minimum dimension of 10.0 x 7.9 mm proximally, and 8.8 x 6.9 mm distally. The apex of the left ventricle is immediately deep to the interspace between the left sixth and seventh ribs anteriorly.  Lungs/Pleura: Diffuse bronchial wall thickening with moderate centrilobular and paraseptal emphysema. No acute consolidative airspace disease. 7 mm nodule in the left upper lobe with dense internal calcifications, compatible with a calcified granuloma, unchanged. 4 mm pulmonary nodules in the left lower lobe on 46 and 47 of series 507, both unchanged compared to prior CT  the abdomen and pelvis 05/17/2012, considered benign. Small left and small to moderate right pleural effusions layering dependently, similar to the prior examination.  Musculoskeletal/Soft Tissues: There are no aggressive appearing lytic or blastic lesions noted in the visualized portions of the skeleton.  CTA ABDOMEN AND PELVIS FINDINGS  Hepatobiliary: Status post cholecystectomy. Liver has a slightly nodular contour, which could be indicative of underlying cirrhosis. No discrete cystic or solid hepatic lesions are identified on today's examination. No intra or extrahepatic biliary ductal dilatation.  Pancreas: 1.0 x 1.4 cm poorly defined low-attenuation (11 HU) lesion in the proximal body of the pancreas (image 167 of series 501) is indeterminate. This appears to be associated with some mild focal ductal ectasia of the main  pancreatic duct in the proximal body of the pancreas. No pancreatic or peripancreatic inflammatory changes.  Spleen: Unremarkable.  Adrenals/Urinary Tract: Numerous sub cm low-attenuation lesions in the kidneys bilaterally are too small to definitively characterize, but are favored to represent small cysts. Mild bilateral renal atrophy. Bilateral perinephric stranding. Mild bilateral hydroureter, without hydronephrosis. Status post radical cystectomy and neobladder, with right lower quadrant ostomy. Chronic adreniform thickening of the left adrenal gland is similar to prior examinations, presumably indicative of adrenal hyperplasia. Right adrenal gland is normal in appearance.  Stomach/Bowel: The appearance of the stomach is normal. No pathologic dilatation of small bowel or colon. Status post partial colectomy in the region of the ascending colon, presumably for neobladder.  Vascular/Lymphatic: Vascular findings and measurements pertinent to potential TAVR procedure, as detailed below. No lymphadenopathy noted in the abdomen or pelvis. There  are numerous borderline enlarged retroperitoneal lymph nodes measuring up to 9 mm in the left para-aortic nodal station.  Reproductive: Postoperative changes of radical cystoprostatectomy are noted, with extensive dystrophic calcifications throughout the pelvic floor, particularly anteriorly.  Other: No significant volume of ascites. No pneumoperitoneum. Trace amount of edema in the presacral space.  Musculoskeletal: 7 mm of anterolisthesis of L4 upon L5. There are no aggressive appearing lytic or blastic lesions noted in the visualized portions of the skeleton.  VASCULAR MEASUREMENTS PERTINENT TO TAVR:  AORTA:  Minimal Aortic Diameter - 14 x 16 mm  Severity of Aortic Calcification - severe  RIGHT PELVIS:  Right Common Iliac Artery -  Minimal Diameter - 8.4 x 6.5 mm  Tortuosity - mild  Calcification - severe  Right External Iliac Artery -  Minimal Diameter - 6.9 x 4.8 mm  Tortuosity - moderate  Calcification - severe  Right Common Femoral Artery -  Minimal Diameter - 4.4 x 4.6 mm  Tortuosity - mild  Calcification - severe  LEFT PELVIS:  Left Common Iliac Artery -  Minimal Diameter - 6.3 x 5.7 mm  Tortuosity - mild  Calcification - severe  Left External Iliac Artery -  Minimal Diameter - 4.3 x 7.4 mm  Tortuosity - moderate  Calcification - severe  Left Common Femoral Artery -  Minimal Diameter - 4.8 x 2.9 mm  Tortuosity - mild  Calcification - severe  Review of the MIP images confirms the above findings.  IMPRESSION: 1. Vascular findings and measurements pertinent to potential TAVR procedure, as detailed above. This patient does not appear to have suitable pelvic arterial access. However, this patient may have suitable subclavian arterial access if clinically appropriate. 2. Cardiomegaly with left atrial dilatation. Notably, there is a filling defect in the tip of the left atrial appendage,  concerning for potential left atrial appendage thrombus. Correlation with transesophageal echocardiography is suggested if not recently performed. 3. Severe thickening calcification of the aortic valve. In addition, there is very extensive calcification of the mitral aortic intervalvular fibrosa, which appears to encroach upon the left ventricular outflow tract in close proximity to the aortic annulus. This may have implications for planned TAVR procedure, and correlation with contemporaneously obtained cardiac CT results is recommended. 4. Dilatation of the pulmonic trunk (4.2 cm in diameter), suggesting pulmonary arterial hypertension. 5. Small left and small to moderate right pleural effusions are chronic and similar to prior examination. 6. Mild diffuse bronchial wall thickening with moderate centrilobular and mild paraseptal emphysema; imaging findings suggestive of underlying COPD. 7. 1.4 x 0.9 cm low-attenuation lesion in the proximal body of the pancreas with some associated ductal ectasia  of the main pancreatic duct. This is nonspecific, but the possibility of intraductal papillary mucinous neoplasm (IPMN) is not excluded, and further evaluation with nonemergent MRI of the abdomen with and without IV gadolinium is suggested in the 1 year to better evaluate this finding. This recommendation follows ACR consensus guidelines: Managing Incidental Findings on Abdominal CT: White Paper of the ACR Incidental Findings Committee. J Am Coll Radiol 2010;7:754-773. 8. Postoperative changes of radical cystoprostatectomy and neobladder formation for history of bladder cancer, as above. 9. Additional incidental findings, as above.   Electronically Signed  By: Vinnie Langton M.D.  On: 12/18/2014 14:09    STS Risk Calculator  ProcedureAVR  Risk of Mortality15.3% Morbidity or  Mortality64.4% Prolonged LOS31.3% Short LOS9.2% Permanent Stroke5.6% Prolonged Vent Support43.2% DSW Infection0.3% Renal Failure42.5% Reoperation22.6%   Impression:  Patient has stage D severe symptomatic aortic stenosis. I have personally reviewed the patient's most recent transthoracic echocardiogram. There is severe calcification with thickening and restricted leaflet mobility involving all 3 leaflets of the aortic valve. Peak velocity across the aortic valve measured greater than 4 m/s corresponding to mean transvalvular gradient estimated 44 mmHg. There is at least mild to moderate aortic insufficiency. There also appears to be bulky calcification involving the mitral valve including both the anterior and posterior annulus. This calcification appears to extend into the left ventricular outflow tract and to the aortic valve annulus through the intervalvular fibrosa. There is moderate mitral regurgitation. Left ventricular systolic function remains reasonably well preserved with ejection fraction estimated 55%. Diagnostic cardiac catheterization is notable for the absence of significant coronary artery disease but also notable for the presence of extensive calcification throughout the aortic root, mitral annulus, left ventricular outflow tract, and ascending thoracic aorta. Risks associated with conventional surgical aortic valve replacement would unquestionably be extremely high, and I do not feel that the patient should be considered a candidate for open surgical aortic valve replacement. Cardiac gated CT angiogram of the heart reveals anatomical features acceptable for transcatheter aortic valve replacement. However, the  presence of significant calcification in the aortic root and left ventricular outflow tract might increase the risk of paravalvular leak. Unfortunately, CT angiogram of the chest, abdomen, and pelvis demonstrates that the patient does not have adequate pelvic vascular access for a transfemoral approach. He has significant disease in the transverse aortic arch.  Because of this and the presence of left upper arm AV fistula for dialysis access, use of the left subclavian approach probably should be avoided.   Plan:  The patient and his wife were again counseled at length regarding treatment alternatives for management of severe symptomatic aortic stenosis. Alternative approaches such as conventional aortic valve replacement, transcatheter aortic valve replacement, and palliative medical therapy were compared and contrasted at length. The risks associated with conventional surgical aortic valve replacement were been discussed in detail, as were expectations for post-operative convalescence. Long-term prognosis with medical therapy was discussed. This discussion was placed in the context of the patient's own specific clinical presentation and past medical history. All of their questions been addressed.  We tentatively plan to proceed with transcatheter aortic valve replacement via transapical approach on Tuesday, 02/16/2015.  Following the decision to proceed with transcatheter aortic valve replacement, a discussion has been held regarding what types of management strategies would be attempted intraoperatively in the event of life-threatening complications, including whether or not the patient would be considered a candidate for the use of cardiopulmonary bypass and/or conversion to open sternotomy for attempted surgical intervention. The patient  has been advised of a variety of complications that might develop including but not limited to risks of death, stroke, paravalvular leak, aortic dissection or  other major vascular complications, aortic annulus rupture, device embolization, cardiac rupture or perforation, mitral regurgitation, acute myocardial infarction, arrhythmia, heart block or bradycardia requiring permanent pacemaker placement, congestive heart failure, respiratory failure, renal failure, pneumonia, infection, other late complications related to structural valve deterioration or migration, or other complications that might ultimately cause a temporary or permanent loss of functional independence or other long term morbidity. The patient provides full informed consent for the procedure as described and all questions were answered.  The patient has been instructed to stop taking warfarin after he takes his dose on Tuesday, 02/09/2015. He has been given a prescription for Lovenox injections to begin on Thursday, 02/11/2015 for bridging therapy between now and the time of surgery.    I spent in excess of 30 minutes during the conduct of this office consultation and >50% of this time involved direct face-to-face encounter with the patient for counseling and/or coordination of their care.   Valentina Gu. Roxy Manns, MD 02/08/2015 5:24 PM

## 2015-02-08 NOTE — Patient Instructions (Signed)
Patient has been instructed to stop taking Coumadin after you take your dose on Tuesday 10/11.  Begin once daily Lovenox injections on Thursday 10/13 and continue through Sunday 10/16.  Do not take Lovenox on Monday 10/17  Patient should continue taking all other medications without change through the day before surgery.  Patient should have nothing to eat or drink after midnight the night before surgery.

## 2015-02-09 ENCOUNTER — Encounter: Payer: Self-pay | Admitting: *Deleted

## 2015-02-09 ENCOUNTER — Other Ambulatory Visit: Payer: Self-pay | Admitting: *Deleted

## 2015-02-09 DIAGNOSIS — Z7901 Long term (current) use of anticoagulants: Secondary | ICD-10-CM

## 2015-02-09 MED ORDER — ENOXAPARIN SODIUM 60 MG/0.6ML ~~LOC~~ SOLN: 60.0000 mg | SUBCUTANEOUS | Status: DC

## 2015-02-10 NOTE — H&P (Signed)
[address]       [place] [address]             [phone]          CARDIOTHORACIC SURGERY HISTORY AND PHYSICAL EXAM  Referring Provider is [name], MD  Primary Cardiologist is TURNER, Eber Hong, MD PCP is [name], MD  Chief Complaint  Patient presents with  . 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 The patient was originally seen in consultation on 12/15/2014 at which time the patient was hospitalized for an acute exacerbation of chronic diastolic congestive heart failure. He was evaluated by a multidisciplinary team of specialists during that hospitalization and diagnosed with stage D severe symptomatic aortic stenosis. He is felt to be a poor candidate for conventional surgical aortic valve replacement because of his advanced age and numerous comorbid medical problems including dialysis-dependent renal failure. In addition, the patient has moderate to severe calcification of the entire ascending thoracic aorta. Plans were made for transcatheter aortic valve replacement as a far less invasive and potentially less risky alternative to extreme risk conventional surgery. During his workup he was found to have thrombus in the left atrial appendage. The patient was subsequently anticoagulated using warfarin and plans for TAVR were postponed. The patient has been maintained on warfarin ever since and was seen recently in follow-up by Dr. Burt Knack.  The patient returns to the office today with hopes to proceed with TAVR next week. He reports no new problems or complaints over the last few weeks. He continues to experience exertional shortness of breath and fatigue with very low level physical activity, consistent with chronic diastolic congestive heart failure New York Heart Association functional class III. He denies any symptoms of resting shortness of breath, PND, orthopnea, dizzy spells, or syncope. His breathing has been somewhat improved and chest tightness has resolved since he began dialysis therapy. He has not been having any particular problems with dialysis treatments lately. The remainder of his review of systems is unchanged from previously.   Past Medical History  Diagnosis Date  . Atrial fibrillation, persistent ([place])     a. cardioversion 11/21/10 . b. 05/2013 - experiencing more SOB. 24-hr holter showed avg HR 49, max 82, min 38bpm. ETT showed baseline junctional bradycardia, HR incr only to 90bpm with drop in BP. Saw EP - amiodarone reduced and eventually d/c'd with improvement in dyspnea.  . Hypertension   . Warfarin anticoagulation   . CKD (chronic kidney disease), stage V ([place])     a. Followed by Dr. Jimmy Footman. b. IV fistula placed 04/17/14.  [name] Aortic valve stenosis   . Orthostatic hypotension   . Diastolic dysfunction   . Arthritis   . Self-catheterizes urinary bladder   . Junctional bradycardia   . NSVT (nonsustained ventricular tachycardia) ([place])     a. By holter 01/2014.  [name] Shortness of breath dyspnea   . PVD (peripheral vascular disease) ([place])   . Cardiomyopathy ([place])   . RTA (renal tubular acidosis)   . Hypomagnesemia   . Bladder  cancer [facility])     a. 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  [name] A-V fistula ([place])   . CAD (coronary artery disease) 12/16/14    a. cath - nonobstrucive CAD 30% pro RCA; 25% dis RCA; 40T pro  Lcx  . Heart murmur   . Dysrhythmia   . CHF (congestive heart failure) ([place])   . Pneumonia   . Full dentures   . Uses hearing aid   . Chronic diarrhea     takes imodium BID    Past Surgical History  Procedure Laterality Date  . Cardioversion  11/21/2010  . Cystectomy    . Cholecystectomy    . Cataract extraction w/ intraocular lens implant, bilateral    . Colonoscopy    . Av fistula placement Left 04/17/2014    Procedure: ARTERIOVENOUS (AV) FISTULA CREATION- LEFT UPPER ARM ; Surgeon: Mal [name], MD; Location: [place]; Service: Vascular; Laterality: Left;  . Bladder surgery    . Vascular surgery    . Multiple tooth extractions    . Cardiac catheterization N/A 12/16/2014    Procedure: Right/Left Heart Cath and Coronary Angiography; Surgeon: [name], MD; Location: [facility] CV LAB; Service: Cardiovascular; Laterality: N/A;  . Eye surgery    . Colon surgery  Nov 2002    reconstructed bladder from colon. per pt. at [facility]  . Tee without cardioversion N/A 01/01/2015    Procedure: TRANSESOPHAGEAL ECHOCARDIOGRAM (TEE); Surgeon: [name], MD; Location: [facility] ENDOSCOPY; Service: Cardiovascular; Laterality: N/A;    Family History  Problem Relation Age of Onset  . CVA Mother   . Heart attack Father   . Heart disease Father   . Arrhythmia Father   . Alzheimer's disease Sister   . Multiple sclerosis Brother   . Alcohol abuse Father     Social History   Social History  . Marital Status: Married    Spouse Name: N/A  . Number of Children: N/A  . Years of Education: N/A   Occupational History  . Not on file.   Social History Main Topics  . Smoking status: Former Smoker -- 2.00 packs/day for 33 years    Types: Cigarettes    Quit date: 05/02/1983  . Smokeless tobacco: Never Used  .  Alcohol Use: Yes     Comment: rare  . Drug Use: No  . Sexual Activity: Not on file   Other Topics Concern  . Not on file   Social History Narrative    Current Outpatient Prescriptions  Medication Sig Dispense Refill  . acetaminophen (TYLENOL) 500 MG tablet Take 1,000 mg by mouth every 6 (six) hours as needed for mild pain or moderate pain.    . calcitRIOL (ROCALTROL) 0.25 MCG capsule Take 0.25 mcg by mouth as needed.     . diltiazem (CARDIZEM CD) 180 MG 24 hr capsule Take 180 mg by mouth at bedtime.    . Hypromellose (ARTIFICIAL TEARS OP) Place 1 drop into both eyes daily as needed (rinse eyes).     . Loperamide HCl (IMODIUM PO) Take 1 tablet by mouth 2 (two) times daily.    . metoprolol tartrate (LOPRESSOR) 25 MG tablet Take 75 mg by mouth every evening.     . Multiple Vitamins-Minerals (CENTRUM SILVER ULTRA MENS PO) Take 1 tablet by mouth daily. Take daily    . Pancrelipase, Lip-Prot-Amyl, (CREON) 24000 UNITS CPEP Take 48,000 Units by mouth 2 (two) times daily with a meal.     . sevelamer carbonate (  RENVELA) 800 MG tablet 1,600 mg 3 (three) times daily with meals.     . warfarin (COUMADIN) 5 MG tablet Take 0.5 tablets (2.5 mg total) by mouth daily. Take as directed by coumadin clinic (Patient taking differently: Take 2.5 mg by mouth daily. [name]) 90 tablet 1   No current facility-administered medications for this visit.    Allergies  Allergen Reactions  . Amiodarone Other (See Comments)    Severely reduced DLCO  . Oxycodone Hcl Rash and Other (See Comments)    Rash and itching    Review of Systems:  General:decreased appetite, decreased energy, + weight gain, no weight loss, no fever Cardiac:no chest pain with exertion, no chest pain at rest, + SOB with exertion, + occasional resting SOB, no PND, + orthopnea, +  palpitations, + arrhythmia, + atrial fibrillation, + LE edema, + dizzy spells, NO syncope Respiratory:+ shortness of breath, no home oxygen, no productive cough, + chronic dry cough, no bronchitis, no wheezing, no hemoptysis, no asthma, no pain with inspiration or cough, no sleep apnea, no CPAP at night ML:YYTK difficulty swallowing, no reflux, no frequent heartburn, no hiatal hernia, no abdominal pain, no constipation, + chronic diarrhea, no hematochezia, no hematemesis, no melena PT:[id number] for urine s/p radical cystectomy, no dysuria, + frequency, no urinary tract infection, no hematuria, *no enlarged prostate, no kidney stones, + kidney disease Vascular:no pain suggestive of claudication, no pain in feet, no leg cramps, no varicose veins, no DVT, + non-healing foot ulcer, right lower leg Neuro:no stroke, no TIA's, no seizures, no headaches, no temporary blindness one eye, no slurred speech, no peripheral neuropathy, no chronic pain, no instability of gait, no memory/cognitive dysfunction Musculoskeletal:mild arthritis - primarily involving the hands, no joint swelling, no myalgias, no difficulty walking, normal mobility  Skin:no rash, no itching, no skin infections, + pressure sores or ulcerations, + chronic skin changes from warfarin Psych:no anxiety, no depression, no nervousness, no unusual recent stress Eyes:+ blurry vision, no floaters, + recent vision changes, + wears glasses or contacts ENT:no hearing loss, edentulous w/ full set dentures, last saw dentist several years ago Hematologic:+ easy  bruising, + abnormal bleeding, no clotting disorder, no frequent epistaxis, no complications w/ long term warfarin Endocrine:no diabetes, does not check CBG's at home   Physical Exam:  BP 129/84 mmHg  Pulse 53  Resp 16  Ht 5\' 8"  (1.727 m)  Wt 163 lb (73.936 kg)  BMI 24.79 kg/m2  SpO2 96% General:elderly-appearing HEENT:Unremarkable  Neck:no JVD, no bruits, no adenopathy  Chest:clear to auscultation, symmetrical breath sounds, no wheezes, no rhonchi  CV:RRR, grade III/VI harsh systolic murmur heard all across precordium Abdomen:soft, non-tender, no masses  Extremities:warm, well-perfused, pulses diminished, + lower extremity edema, + ulceration right lower leg Rectal/GUDeferred Neuro:Grossly non-focal and symmetrical throughout Skin:Clean and dry, no rashes, no breakdown   Diagnostic Tests:  Echocardiography  Patient:  Jeffrey, Graefe MR #:    [id number] Study Date: 12/03/2014 Gender:   M Age:    34 Height:   172.7 cm Weight:   78.5 kg BSA:    1.95 m^2 Pt. Status: Room:  ATTENDING  Fransico Him, MD ORDERING   Fransico Him, MD REFERRING  Fransico Him, MD SONOGRAPHER Charlann Noss, RDCS PERFORMING  [facility], Outpatient  cc:  ------------------------------------------------------------------- LV EF: 55%  ------------------------------------------------------------------- Indications:   Cardiomyopathy.  ------------------------------------------------------------------- History:  PMH: Acquired from the patient  and from the patient&'s chart. Atrial fibrillation. Cardiomyopathy. Aortic valve disease. Chronic kidney disease. Risk factors: Hypertension.  -------------------------------------------------------------------  Study Conclusions  - Left ventricle: There was moderate concentric hypertrophy. The estimated ejection fraction was 55%. - Aortic valve: There was severe stenosis. There was mild regurgitation. - Mitral valve: Severely calcified annulus. There was mild to moderate regurgitation. Valve area by continuity equation (using LVOT flow): 1.25 cm^2. - Left atrium: The atrium was severely dilated. - Right atrium: The atrium was mildly dilated. - Atrial septum: No defect or patent foramen ovale was identified.  ------------------------------------------------------------------- Labs, prior tests, procedures, and surgery: Echocardiography (February 2016).  Echocardiography. M-mode, complete 2D, spectral Doppler, and color Doppler. Birthdate: Patient birthdate: [birth year]. Age: Patient is 74 yr old. Sex: Gender: male.  BMI: 26.3 kg/m^2. Blood pressure:   145/86 Patient status: Outpatient. Study date: Study date: 12/03/2014. Study time: 09:54 AM. Location: [facility]  -------------------------------------------------------------------  ------------------------------------------------------------------- Left ventricle: There was moderate concentric hypertrophy. The estimated ejection fraction was 55%.  ------------------------------------------------------------------- Aortic valve:  Moderately calcified leaflets. Doppler:  There was severe stenosis.  There was mild regurgitation.  VTI ratio of LVOT to aortic valve: 0.19. Valve area (VTI): 0.47 cm^2. Indexed valve area (VTI): 0.24 cm^2/m^2. Mean velocity ratio of LVOT to aortic valve: 0.18. Valve area (Vmean): 0.45 cm^2. Indexed valve area (Vmean): 0.23 cm^2/m^2.  Mean gradient (S): 44  mm Hg. Peak gradient (S): 87 mm Hg.  ------------------------------------------------------------------- Aorta: Ascending aorta: The ascending aorta was mildly dilated.  ------------------------------------------------------------------- Mitral valve:  Severely calcified annulus. Doppler: There was mild to moderate regurgitation.  Valve area by continuity equation (using LVOT flow): 1.25 cm^2. Indexed valve area by continuity equation (using LVOT flow): 0.64 cm^2/m^2.  Mean gradient (D): 4 mm Hg.  ------------------------------------------------------------------- Left atrium: The atrium was severely dilated.  ------------------------------------------------------------------- Atrial septum: No defect or patent foramen ovale was identified.  ------------------------------------------------------------------- Right ventricle: The cavity size was normal. Wall thickness was normal. Systolic function was normal.  ------------------------------------------------------------------- Pulmonic valve:  Doppler: There was mild regurgitation.  ------------------------------------------------------------------- Tricuspid valve:  Doppler: There was mild regurgitation.  ------------------------------------------------------------------- Right atrium: The atrium was mildly dilated.  ------------------------------------------------------------------- Pericardium: The pericardium was normal in appearance.  ------------------------------------------------------------------- Measurements  Left ventricle             Value       Reference LV ID, ED, PLAX chordal        51  mm     43 - 52 LV ID, ES, PLAX chordal        32  mm     23 - 38 LV fx shortening, PLAX chordal     37  %      >=29 LV PW thickness, ED          14  mm     --------- IVS/LV PW ratio, ED          0.93        <=1.3 Stroke volume, 2D           51  ml     --------- Stroke volume/bsa, 2D         26  ml/m^2   ---------  Ventricular septum           Value       Reference IVS thickness, ED           13  mm     ---------  LVOT                  Value       Reference LVOT ID, S  18  mm     --------- LVOT area               2.54 cm^2    --------- LVOT ID                18  mm     --------- LVOT mean velocity, S         52.4 cm/s    --------- LVOT VTI, S              20.2 cm     --------- Stroke volume (SV), LVOT DP      51.4 ml     --------- Cardiac output (Qs), LVOT DP      3.9  L/min    --------- Cardiac index (Qs/bsa), LVOT      2   L/(min-m^2) --------- DP Stroke index (SV/bsa), LVOT DP     26.3 ml/m^2   ---------  Aortic valve              Value       Reference Aortic valve peak velocity, S     466  cm/s    --------- Aortic valve mean velocity, S     298  cm/s    --------- Aortic valve VTI, S          109  cm     --------- Aortic mean gradient, S        44  mm Hg    --------- Aortic peak gradient, S        87  mm Hg    --------- VTI ratio, LVOT/AV           0.19       --------- Aortic valve area, VTI         0.47 cm^2    --------- Aortic valve area/bsa, VTI       0.24 cm^2/m^2  --------- Velocity ratio, mean, LVOT/AV     0.18       --------- Aortic valve area, mean        0.45 cm^2    --------- velocity Aortic valve area/bsa, mean      0.23 cm^2/m^2  --------- velocity Aortic regurg pressure         489  ms     --------- half-time  Aorta                  Value       Reference Aortic root ID, ED           39  mm     ---------  Left atrium              Value       Reference LA ID, A-P, ES             56  mm     --------- LA ID/bsa, A-P         (H)   2.87 cm/m^2   <=2.2 LA volume, S              238  ml     --------- LA volume/bsa, S            121.8 ml/m^2   --------- LA volume, ES, 1-p A4C         189  ml     --------- LA volume/bsa, ES, 1-p A4C       96.7 ml/m^2   --------- LA volume, ES, 1-p A2C  282  ml     --------- LA volume/bsa, ES, 1-p A2C       144.3 ml/m^2   ---------  Mitral valve              Value       Reference Mitral mean velocity, D        91.2 cm/s    --------- Mitral mean gradient, D        4   mm Hg    --------- Mitral valve area, LVOT        1.25 cm^2    --------- continuity Mitral valve area/bsa, LVOT      0.64 cm^2/m^2  --------- continuity Mitral annulus VTI, D         41.1 cm     ---------  Systemic veins             Value       Reference Estimated CVP             3   mm Hg    ---------  Right ventricle            Value       Reference RV s&', lateral, S           8.38 cm/s    ---------  Legend: (L) and (H) mark values outside specified reference range.  ------------------------------------------------------------------- Prepared and Electronically Authenticated by  Jenkins Rouge, M.D. 2016-08-04T11:00:22             CARDIAC CATHETERIZATION  Procedures    Right/Left Heart Cath and Coronary Angiography    Conclusion     Prox RCA lesion, 30% stenosed.  Dist RCA lesion, 25% stenosed.  Prox Cx lesion, 40%  stenosed.  Assessment: 1. Calcified but nonobstructive coronary artery disease 2. 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 Dense calcification of the aortic valve annulus extending into the mitral valve annulus, and at least moderate calcification of the ascending aorta.  Plan: Continued evaluation by the Multidisciplinary Valve Team for TAVR versus conventional AVR     Indications    Aortic stenosis [I35.0 (ICD-10-CM)]    Technique and Indications    INDICATION: Severe symptomatic aortic stenosis  PROCEDURAL DETAILS: The right groin was prepped, draped, and anesthetized with 1% lidocaine. Using the modified Seldinger technique a 5 French sheath was placed in the right femoral artery and a 7 French sheath was placed in the right femoral vein. A Swan-Ganz catheter was used for the right heart catheterization. Standard protocol was followed for recording of right heart pressures and sampling of oxygen saturations. Fick cardiac output was calculated. Standard Judkins catheters were used for selective coronary angiography. The aortic valve was not crossed. There was a significant amount of bleeding around the arterial sheath throughout the procedure. Manual pressure was used for hemostasis even during the procedure. Immediately at the completion of the procedure, the arterial sheath was pulled on the table and manual pressure was used to achieve hemostasis. There was no evidence of a large or firm hematoma once the procedure was completed. There were no immediate procedural complications. The patient was transferred to the post catheterization recovery area for further monitoring.  ESTIMATED BLOOD LOSS: approximately 100 mlThere were no immediate complications during the procedure.    Coronary Findings    Dominance: Right   Left Main  The vessel is angiographically normal.     Left Anterior Descending  The vessel exhibits minimal luminal  irregularities.  Left Circumflex   . Prox Cx lesion, 40% stenosed. calcified .     Right Coronary Artery  There is mild the vessel.   . Prox RCA lesion, 30% stenosed. diffuse .   [name] Dist RCA lesion, 25% stenosed.      Left Heart    Mitral Valve The annulus is calcified. There is severe mitral annular calcification with a dense bar of calcium visualized from the aortic valve annulus to the mitral valve annulus   Aortic Valve The aortic valve is calcified. There is restricted aortic valve motion.   Aorta There is heavy calcification of the ascending aorta    Coronary Diagrams    Diagnostic Diagram            Implants    Name ID Temporary Type Supply   No information to display    PACS Images    Show images for Cardiac catheterization     Link to Procedure Log    Procedure Log      Hemo Data       Most Recent Value   Fick Cardiac Output  6.78 L/min   Fick Cardiac Output Index  3.51 (L/min)/BSA   RA A Wave  -99 mmHg   RA V Wave  7 mmHg   RA Mean  5 mmHg   RV Systolic Pressure  60 mmHg   RV Diastolic Pressure  1 mmHg   RV EDP  9 mmHg   PA Systolic Pressure  50 mmHg   PA Diastolic Pressure  22 mmHg   PA Mean  34 mmHg   PW A Wave  -99 mmHg   PW V Wave  21 mmHg   PW Mean  19 mmHg   AO Systolic Pressure  756 mmHg   AO Diastolic Pressure  74 mmHg   AO Mean  100 mmHg   QP/QS  1   TPVR Index  9.68 HRUI   TSVR Index  29.04 HRUI   PVR SVR Ratio  0.15   TPVR/TSVR Ratio  0.33    Cardiac TAVR CT  TECHNIQUE: The patient was scanned on a Philips 256 scanner. A 120 kV retrospective scan was triggered in the descending thoracic aorta at 111 HU's. Gantry rotation speed was 270 msecs and collimation was .9 mm. 5 mg of iv Metoprolol and no nitro were given. The 3D data set was  reconstructed in 5% intervals of the R-R cycle. Systolic and diastolic phases were analyzed on a dedicated work station using MPR, MIP and VRT modes. The patient received 80 cc of contrast.  FINDINGS: Aortic Valve: Severe leaflet calcification with severely restricted leaflet opening. There is significant asymmetric calcification under the non-coronary leaflet extending into the LVOT that continues into mitral annulus.  Aorta: Normal caliber. Moderate diffuse calcifications in the entire thoracic aorta. No dissection.  Sinotubular Junction: 31 x 29 mm  Ascending Thoracic Aorta: 36 x 35 mm  Aortic Arch: 27 x 27 mm  Descending Thoracic Aorta: 30 x 30 mm  Sinus of Valsalva Measurements:  Non-coronary: 32 mm  Right -coronary: 33 mm  Left -coronary: 36 mm  Coronary Artery Height above Annulus:  Left Main: 11 mm  Right Coronary: 12 mm  Virtual Basal Annulus Measurements:  Maximum/Minimum Diameter: 30 x 23 mm  Perimeter: 97 mm  Area: 560 mm2  Optimum Fluoroscopic Angle for Delivery: LAO 11 CAU 7  Other findings: Dilated pulmonary artery (41 x 37 mm) suggestive of pulmonary hypertension.  Normal pulmonary vein drainage.  Moderate concentric LVH.  Severely dilated left atrium.  There is a filling defect in the left atrial appendage that might represent incomplete contrast penetration vs a small thrombus.  IMPRESSION: 1. Severe leaflet calcification with severely restricted leaflet opening and annular measurement suitable for delivery of 29 mm Edward SAPIEN 3 valve.  There is significant asymmetric calcification located under the non-coronary leaflet extending into the LVOT.  2. Sufficient coronary to annulus distance.  3. Optimum Fluoroscopic Angle for Delivery: LAO 11 CAU 7  4. There is a filling defect in the left atrial appendage that might represent incomplete contrast penetration vs a small  thrombus.  Ena Dawley   Electronically Signed  By: Ena Dawley  On: 12/18/2014 18:37   CT ANGIOGRAPHY CHEST WITH CONTRAST  TECHNIQUE: Multidetector CT imaging of the chest was performed using the standard protocol during bolus administration of intravenous contrast. Multiplanar CT image reconstructions and MIPs were obtained to evaluate the vascular anatomy.  CONTRAST: 74mL OMNIPAQUE IOHEXOL 350 MG/ML SOLN  COMPARISON: Chest CT 06/11/2014. CT the abdomen and pelvis 05/17/2012.  FINDINGS: CTA CHEST FINDINGS  Mediastinum/Lymph Nodes: Heart size is enlarged with left atrial dilatation. Filling defect in the tip of the left atrial appendage, concerning for left atrial appendage thrombus. There is no significant pericardial fluid, thickening or pericardial calcification. There is atherosclerosis of the thoracic aorta, the great vessels of the mediastinum and the coronary arteries, including calcified atherosclerotic plaque in the left main, left anterior descending, left circumflex and right coronary arteries. Severe thickening calcifications of the aortic valve. Profound calcification of the mitral-aortic intervalvular fibrosa. Severe thickening of the mitral valve and mitral annulus. Dilatation of the pulmonic trunk up to 4.2 cm in diameter, suggesting pulmonary arterial hypertension. Multiple prominent borderline enlarged and minimally enlarged mediastinal and bilateral hilar lymph nodes measuring up to 1 cm in short axis in the right hilum and 1.2 cm in short axis in the left hilum, similar to the prior examination from 06/11/2014. Esophagus is unremarkable in appearance. No axillary lymphadenopathy. Right subclavian artery is moderately tortuous and mildly calcified with minimum dimension of 9.6 x 9.1 mm proximally and 8.8 x 8.9 mm distally. Left subclavian artery is moderately tortuous and mildly calcified with a minimum dimension of 10.0 x  7.9 mm proximally, and 8.8 x 6.9 mm distally. The apex of the left ventricle is immediately deep to the interspace between the left sixth and seventh ribs anteriorly.  Lungs/Pleura: Diffuse bronchial wall thickening with moderate centrilobular and paraseptal emphysema. No acute consolidative airspace disease. 7 mm nodule in the left upper lobe with dense internal calcifications, compatible with a calcified granuloma, unchanged. 4 mm pulmonary nodules in the left lower lobe on 46 and 47 of series 507, both unchanged compared to prior CT the abdomen and pelvis 05/17/2012, considered benign. Small left and small to moderate right pleural effusions layering dependently, similar to the prior examination.  Musculoskeletal/Soft Tissues: There are no aggressive appearing lytic or blastic lesions noted in the visualized portions of the skeleton.  CTA ABDOMEN AND PELVIS FINDINGS  Hepatobiliary: Status post cholecystectomy. Liver has a slightly nodular contour, which could be indicative of underlying cirrhosis. No discrete cystic or solid hepatic lesions are identified on today's examination. No intra or extrahepatic biliary ductal dilatation.  Pancreas: 1.0 x 1.4 cm poorly defined low-attenuation (11 HU) lesion in the proximal body of the pancreas (image 167 of series 501) is indeterminate. This appears to be associated with some mild focal ductal ectasia of the main  pancreatic duct in the proximal body of the pancreas. No pancreatic or peripancreatic inflammatory changes.  Spleen: Unremarkable.  Adrenals/Urinary Tract: Numerous sub cm low-attenuation lesions in the kidneys bilaterally are too small to definitively characterize, but are favored to represent small cysts. Mild bilateral renal atrophy. Bilateral perinephric stranding. Mild bilateral hydroureter, without hydronephrosis. Status post radical cystectomy and neobladder, with right lower quadrant ostomy. Chronic  adreniform thickening of the left adrenal gland is similar to prior examinations, presumably indicative of adrenal hyperplasia. Right adrenal gland is normal in appearance.  Stomach/Bowel: The appearance of the stomach is normal. No pathologic dilatation of small bowel or colon. Status post partial colectomy in the region of the ascending colon, presumably for neobladder.  Vascular/Lymphatic: Vascular findings and measurements pertinent to potential TAVR procedure, as detailed below. No lymphadenopathy noted in the abdomen or pelvis. There are numerous borderline enlarged retroperitoneal lymph nodes measuring up to 9 mm in the left para-aortic nodal station.  Reproductive: Postoperative changes of radical cystoprostatectomy are noted, with extensive dystrophic calcifications throughout the pelvic floor, particularly anteriorly.  Other: No significant volume of ascites. No pneumoperitoneum. Trace amount of edema in the presacral space.  Musculoskeletal: 7 mm of anterolisthesis of L4 upon L5. There are no aggressive appearing lytic or blastic lesions noted in the visualized portions of the skeleton.  VASCULAR MEASUREMENTS PERTINENT TO TAVR:  AORTA:  Minimal Aortic Diameter - 14 x 16 mm  Severity of Aortic Calcification - severe  RIGHT PELVIS:  Right Common Iliac Artery -  Minimal Diameter - 8.4 x 6.5 mm  Tortuosity - mild  Calcification - severe  Right External Iliac Artery -  Minimal Diameter - 6.9 x 4.8 mm  Tortuosity - moderate  Calcification - severe  Right Common Femoral Artery -  Minimal Diameter - 4.4 x 4.6 mm  Tortuosity - mild  Calcification - severe  LEFT PELVIS:  Left Common Iliac Artery -  Minimal Diameter - 6.3 x 5.7 mm  Tortuosity - mild  Calcification - severe  Left External Iliac Artery -  Minimal Diameter - 4.3 x 7.4 mm  Tortuosity - moderate  Calcification - severe  Left Common Femoral  Artery -  Minimal Diameter - 4.8 x 2.9 mm  Tortuosity - mild  Calcification - severe  Review of the MIP images confirms the above findings.  IMPRESSION: 1. Vascular findings and measurements pertinent to potential TAVR procedure, as detailed above. This patient does not appear to have suitable pelvic arterial access. However, this patient may have suitable subclavian arterial access if clinically appropriate. 2. Cardiomegaly with left atrial dilatation. Notably, there is a filling defect in the tip of the left atrial appendage, concerning for potential left atrial appendage thrombus. Correlation with transesophageal echocardiography is suggested if not recently performed. 3. Severe thickening calcification of the aortic valve. In addition, there is very extensive calcification of the mitral aortic intervalvular fibrosa, which appears to encroach upon the left ventricular outflow tract in close proximity to the aortic annulus. This may have implications for planned TAVR procedure, and correlation with contemporaneously obtained cardiac CT results is recommended. 4. Dilatation of the pulmonic trunk (4.2 cm in diameter), suggesting pulmonary arterial hypertension. 5. Small left and small to moderate right pleural effusions are chronic and similar to prior examination. 6. Mild diffuse bronchial wall thickening with moderate centrilobular and mild paraseptal emphysema; imaging findings suggestive of underlying COPD. 7. 1.4 x 0.9 cm low-attenuation lesion in the proximal body of the pancreas with some associated ductal ectasia  of the main pancreatic duct. This is nonspecific, but the possibility of intraductal papillary mucinous neoplasm (IPMN) is not excluded, and further evaluation with nonemergent MRI of the abdomen with and without IV gadolinium is suggested in the 1 year to better evaluate this finding. This recommendation follows ACR consensus guidelines: Managing  Incidental Findings on Abdominal CT: White Paper of the ACR Incidental Findings Committee. J Am Coll Radiol 2010;7:754-773. 8. Postoperative changes of radical cystoprostatectomy and neobladder formation for history of bladder cancer, as above. 9. Additional incidental findings, as above.   Electronically Signed  By: Vinnie Langton M.D.  On: 12/18/2014 14:09    STS Risk Calculator  ProcedureAVR  Risk of Mortality15.3% Morbidity or Mortality64.4% Prolonged LOS31.3% Short LOS9.2% Permanent Stroke5.6% Prolonged Vent Support43.2% DSW Infection0.3% Renal Failure42.5% Reoperation22.6%   Impression:  Patient has stage D severe symptomatic aortic stenosis. I have personally reviewed the patient's most recent transthoracic echocardiogram. There is severe calcification with thickening and restricted leaflet mobility involving all 3 leaflets of the aortic valve. Peak velocity across the aortic valve measured greater than 4 m/s corresponding to mean transvalvular gradient estimated 44 mmHg. There is at least mild to moderate aortic insufficiency. There also appears to be bulky calcification involving the mitral valve including both the anterior and posterior annulus. This calcification appears to extend into the left ventricular outflow tract and to the aortic valve annulus through the intervalvular fibrosa. There is moderate mitral regurgitation. Left ventricular systolic function remains reasonably well preserved with ejection fraction estimated 55%. Diagnostic cardiac catheterization is notable for the absence of significant coronary artery disease  but also notable for the presence of extensive calcification throughout the aortic root, mitral annulus, left ventricular outflow tract, and ascending thoracic aorta. Risks associated with conventional surgical aortic valve replacement would unquestionably be extremely high, and I do not feel that the patient should be considered a candidate for open surgical aortic valve replacement. Cardiac gated CT angiogram of the heart reveals anatomical features acceptable for transcatheter aortic valve replacement. However, the presence of significant calcification in the aortic root and left ventricular outflow tract might increase the risk of paravalvular leak. Unfortunately, CT angiogram of the chest, abdomen, and pelvis demonstrates that the patient does not have adequate pelvic vascular access for a transfemoral approach. He has significant disease in the transverse aortic arch. Because of this and the presence of left upper arm AV fistula for dialysis access, use of the left subclavian approach probably should be avoided.   Plan:  The patient and his wife were again counseled at length regarding treatment alternatives for management of severe symptomatic aortic stenosis. Alternative approaches such as conventional aortic valve replacement, transcatheter aortic valve replacement, and palliative medical therapy were compared and contrasted at length. The risks associated with conventional surgical aortic valve replacement were been discussed in detail, as were expectations for post-operative convalescence. Long-term prognosis with medical therapy was discussed. This discussion was placed in the context of the patient's own specific clinical presentation and past medical history. All of their questions been addressed.  We tentatively plan to proceed with transcatheter aortic valve replacement via transapical approach on Tuesday, 02/16/2015.  Following the decision to proceed with transcatheter aortic valve  replacement, a discussion has been held regarding what types of management strategies would be attempted intraoperatively in the event of life-threatening complications, including whether or not the patient would be considered a candidate for the use of cardiopulmonary bypass and/or conversion to open sternotomy for attempted surgical intervention. The patient  has been advised of a variety of complications that might develop including but not limited to risks of death, stroke, paravalvular leak, aortic dissection or other major vascular complications, aortic annulus rupture, device embolization, cardiac rupture or perforation, mitral regurgitation, acute myocardial infarction, arrhythmia, heart block or bradycardia requiring permanent pacemaker placement, congestive heart failure, respiratory failure, renal failure, pneumonia, infection, other late complications related to structural valve deterioration or migration, or other complications that might ultimately cause a temporary or permanent loss of functional independence or other long term morbidity. The patient provides full informed consent for the procedure as described and all questions were answered.  The patient has been instructed to stop taking warfarin after he takes his dose on Tuesday, 02/09/2015. He has been given a prescription for Lovenox injections to begin on Thursday, 02/11/2015 for bridging therapy between now and the time of surgery.    I spent in excess of 30 minutes during the conduct of this office consultation and >50% of this time involved direct face-to-face encounter with the patient for counseling and/or coordination of their care.   Valentina Gu. Roxy Manns, MD 02/08/2015 5:24 PM

## 2015-02-11 ENCOUNTER — Encounter (HOSPITAL_COMMUNITY)
Admission: RE | Admit: 2015-02-11 | Discharge: 2015-02-11 | Disposition: A | Discharge status: Home / Self Care | Payer: MEDICARE | Source: Ambulatory Visit | Attending: Thoracic Surgery (Cardiothoracic Vascular Surgery) | Admitting: Thoracic Surgery (Cardiothoracic Vascular Surgery)

## 2015-02-11 ENCOUNTER — Encounter (INDEPENDENT_AMBULATORY_CARE_PROVIDER_SITE_OTHER): Payer: MEDICARE

## 2015-02-11 ENCOUNTER — Encounter (HOSPITAL_COMMUNITY): Payer: Self-pay

## 2015-02-11 VITALS — BP 115/73 | HR 74 | Temp 98.2°F | Resp 18 | Ht 68.0 in | Wt 164.1 lb

## 2015-02-11 DIAGNOSIS — Z01818 Encounter for other preprocedural examination: Secondary | ICD-10-CM | POA: Diagnosis not present

## 2015-02-11 DIAGNOSIS — Z0183 Encounter for blood typing: Secondary | ICD-10-CM | POA: Diagnosis not present

## 2015-02-11 DIAGNOSIS — Z87891 Personal history of nicotine dependence: Secondary | ICD-10-CM | POA: Insufficient documentation

## 2015-02-11 DIAGNOSIS — Z79899 Other long term (current) drug therapy: Secondary | ICD-10-CM | POA: Diagnosis not present

## 2015-02-11 DIAGNOSIS — I4891 Unspecified atrial fibrillation: Secondary | ICD-10-CM

## 2015-02-11 DIAGNOSIS — Z8551 Personal history of malignant neoplasm of bladder: Secondary | ICD-10-CM | POA: Insufficient documentation

## 2015-02-11 DIAGNOSIS — N186 End stage renal disease: Secondary | ICD-10-CM | POA: Insufficient documentation

## 2015-02-11 DIAGNOSIS — I509 Heart failure, unspecified: Secondary | ICD-10-CM | POA: Diagnosis not present

## 2015-02-11 DIAGNOSIS — I350 Nonrheumatic aortic (valve) stenosis: Secondary | ICD-10-CM | POA: Insufficient documentation

## 2015-02-11 DIAGNOSIS — Z992 Dependence on renal dialysis: Secondary | ICD-10-CM | POA: Diagnosis not present

## 2015-02-11 DIAGNOSIS — Z7901 Long term (current) use of anticoagulants: Secondary | ICD-10-CM | POA: Insufficient documentation

## 2015-02-11 DIAGNOSIS — R9431 Abnormal electrocardiogram [ECG] [EKG]: Secondary | ICD-10-CM | POA: Diagnosis not present

## 2015-02-11 DIAGNOSIS — I2510 Atherosclerotic heart disease of native coronary artery without angina pectoris: Secondary | ICD-10-CM | POA: Insufficient documentation

## 2015-02-11 DIAGNOSIS — J449 Chronic obstructive pulmonary disease, unspecified: Secondary | ICD-10-CM | POA: Diagnosis not present

## 2015-02-11 DIAGNOSIS — Z01812 Encounter for preprocedural laboratory examination: Secondary | ICD-10-CM | POA: Diagnosis not present

## 2015-02-11 HISTORY — DX: Dependence on renal dialysis: Z99.2

## 2015-02-11 HISTORY — DX: Spontaneous ecchymoses: R23.3

## 2015-02-11 HISTORY — DX: Other skin changes: R23.8

## 2015-02-11 LAB — CBC
HCT: 35.9 % — ABNORMAL LOW (ref 39.0–52.0)
HEMOGLOBIN: 11.8 g/dL — AB (ref 13.0–17.0)
MCH: 34.9 pg — AB (ref 26.0–34.0)
MCHC: 32.9 g/dL (ref 30.0–36.0)
MCV: 106.2 fL — ABNORMAL HIGH (ref 78.0–100.0)
Platelets: 172 10*3/uL (ref 150–400)
RBC: 3.38 MIL/uL — ABNORMAL LOW (ref 4.22–5.81)
RDW: 15.1 % (ref 11.5–15.5)
WBC: 7.3 10*3/uL (ref 4.0–10.5)

## 2015-02-11 LAB — URINALYSIS, ROUTINE W REFLEX MICROSCOPIC
Bilirubin Urine: NEGATIVE
GLUCOSE, UA: NEGATIVE mg/dL
KETONES UR: 15 mg/dL — AB
Nitrite: NEGATIVE
Specific Gravity, Urine: 1.015 (ref 1.005–1.030)
UROBILINOGEN UA: 0.2 mg/dL (ref 0.0–1.0)
pH: 8.5 — ABNORMAL HIGH (ref 5.0–8.0)

## 2015-02-11 LAB — URINE MICROSCOPIC-ADD ON

## 2015-02-11 LAB — COMPREHENSIVE METABOLIC PANEL
ALBUMIN: 3.5 g/dL (ref 3.5–5.0)
ALK PHOS: 62 U/L (ref 38–126)
ALT: 25 U/L (ref 17–63)
ANION GAP: 14 (ref 5–15)
AST: 33 U/L (ref 15–41)
BILIRUBIN TOTAL: 0.9 mg/dL (ref 0.3–1.2)
BUN: 36 mg/dL — ABNORMAL HIGH (ref 6–20)
CALCIUM: 9.4 mg/dL (ref 8.9–10.3)
CO2: 25 mmol/L (ref 22–32)
Chloride: 99 mmol/L — ABNORMAL LOW (ref 101–111)
Creatinine, Ser: 4.05 mg/dL — ABNORMAL HIGH (ref 0.61–1.24)
GFR calc non Af Amer: 13 mL/min — ABNORMAL LOW (ref >60)
GFR, EST AFRICAN AMERICAN: 15 mL/min — AB (ref >60)
Glucose, Bld: 99 mg/dL (ref 65–99)
POTASSIUM: 4.2 mmol/L (ref 3.5–5.1)
SODIUM: 138 mmol/L (ref 135–145)
TOTAL PROTEIN: 6.5 g/dL (ref 6.5–8.1)

## 2015-02-11 LAB — APTT: APTT: 43 s — AB (ref 24–37)

## 2015-02-11 LAB — SURGICAL PCR SCREEN
MRSA, PCR: NEGATIVE
Staphylococcus aureus: NEGATIVE

## 2015-02-11 LAB — PROTIME-INR
INR: 2.36 — AB (ref 0.00–1.49)
Prothrombin Time: 25.6 seconds — ABNORMAL HIGH (ref 11.6–15.2)

## 2015-02-11 NOTE — Pre-Procedure Instructions (Signed)
[name] Tine  02/11/2015     Your procedure is scheduled on : Tuesday February 16, 2015 at 7:30 AM.  Report to [facility] Admitting at 5:30 A.M.  Call this number if you have problems the morning of surgery: [phone]    Remember:  Do not eat food or drink liquids after midnight.  Take these medicines the morning of surgery with A SIP OF WATER : Acetaminophen (Tylenol) if needed   Please follow your physicians orders regarding Coumadin   Stop taking any vitamins, herbal medications, Ibuprofen, Advil, Motrin, Aleve, etc   Do not wear jewelry.  Do not wear lotions, powders, or cologne.   Men may shave face and neck.  Do not bring valuables to the hospital.  [facility] is not responsible for any belongings or valuables.  Contacts, dentures or bridgework may not be worn into surgery.  Leave your suitcase in the car.  After surgery it may be brought to your room.  For patients admitted to the hospital, discharge time will be determined by your treatment team.  Patients discharged the day of surgery will not be allowed to drive home.   Name and phone number of your driver:    Special instructions:  Shower using CHG soap the night before and the morning of your surgery Please read over the following fact sheets that you were given. Pain Booklet, Coughing and Deep Breathing, Blood Transfusion Information, MRSA Information and Surgical Site Infection Prevention

## 2015-02-12 ENCOUNTER — Other Ambulatory Visit (HOSPITAL_COMMUNITY): Payer: Self-pay

## 2015-02-12 LAB — BLOOD GAS, ARTERIAL
ACID-BASE EXCESS: 2.7 mmol/L — AB (ref 0.0–2.0)
Bicarbonate: 26.2 mEq/L — ABNORMAL HIGH (ref 20.0–24.0)
Drawn by: 42180
FIO2: 0.21
O2 SAT: 95.2 %
PATIENT TEMPERATURE: 98.6
PCO2 ART: 37.4 mmHg (ref 35.0–45.0)
PH ART: 7.461 — AB (ref 7.350–7.450)
PO2 ART: 78 mmHg — AB (ref 80.0–100.0)
TCO2: 27.4 mmol/L (ref 0–100)

## 2015-02-12 LAB — HEMOGLOBIN A1C
Hgb A1c MFr Bld: 5.9 % — ABNORMAL HIGH (ref 4.8–5.6)
MEAN PLASMA GLUCOSE: 123 mg/dL

## 2015-02-12 NOTE — Progress Notes (Signed)
Anesthesia Chart Review: Patient is a 74 year old male scheduled for TAVR, transapical approach on 02/16/15 by Dr. Roxy Manns. According to TCTS notes, "Unfortunately, CT angiogram of the chest, abdomen, and pelvis demonstrates that the patient does not have adequate pelvic vascular access for a transfemoral approach. He has significant disease in the transverse aortic arch. Because of this and the presence of left upper arm AV fistula for dialysis access, use of the left subclavian approach probably should be avoided." Procedure initially scheduled for 01/05/15, but was postponed a month due to finding of LAA thrombus and need for anticoagulation.  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   Meds include diltiazem, Lopressor, Creon, Renvela, warfarin. Dr. Roxy Manns instructed him to hold warfarin after his 02/09/15 dose and start Lovenox 02/11/15.  PCP is Dr. Deland Pretty. Nephrologist is Dr. Jimmy Footman.  Pulmonologist is Dr. Melvyn Novas (PRN f/u 09/12/13 unless symptoms worsen off amiodarone).  Cardiologist is Dr Fransico Him. Saw EP cardiologist Dr. Lovena Le in 06/2014 for afib and nonsustained VT on cardiac monitor who felt there was on limited medical therapy for arrhythmia treatment since he has near ESRD and is intolerant to amiodarone. No plans for implants for PPM or ICD at that time.    01/01/15 TEE (Dr. Aundra Dubin): Normal LV size with EF 55%. Mild concentric LV hypertrophy. Normal wall motion. Mildly dilated RV with normal systolic function. Moderately calcified mitral valve and annulus with mild to moderate MR. Mean gradient across the valve was 5 mmHg but MVA by pressure half  time was within normal range, so doubt there is significant mitral stenosis. Severe calcified and trileaflet aortic valve with severe stenosis, mean gradient 42 mmHg. There was mild aortic insufficiency. The left atrium was moderately dilated with a smoke + a small thrombus in the LA appendage. The right atrium was mildly dilated. Normal caliber aorta with grade IV plaque in the descending thoracic aorta.  Plan: There is a LA appendage thrombus. Will postpone TAVR. Spoke with Dr Burt Knack, will have patient restart coumadin today and followup in coumadin clinic.   12/03/14 TTE: Study Conclusions - Left ventricle: There was moderate concentric hypertrophy. The estimated ejection fraction was 55%. - Aortic valve: There was severe stenosis. There was mild regurgitation. - Mitral valve: Severely calcified annulus. There was mild to moderate regurgitation. Valve area by continuity equation (using LVOT flow): 1.25 cm^2. - Left atrium: The atrium was severely dilated. - Right atrium: The atrium was mildly dilated. - Atrial septum: No defect or patent foramen ovale was identified.  12/16/14 RHC/LHC:  Prox RCA lesion, 30% stenosed.  Dist RCA lesion, 25% stenosed.  Prox Cx lesion, 40% stenosed. Assessment: 1. Calcified but nonobstructive coronary artery disease 2. 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 Dense calcification of the aortic valve annulus extending into the mitral valve annulus, and at least moderate calcification of the ascending aorta. Plan: Continued evaluation by the Multidisciplinary Valve Team for TAVR versus conventional AVR  02/11/15 EKG: afib, rightward axis, incomplete right BBB, non-specific ST/T wave abnormality, prolonged QT.  02/11/15 CXR: COPD with chronic pulmonary fibrotic changes and previous granulomatous infection. Top-normal cardiac size without evidence of pulmonary edema. No significant pleural effusion.  06/11/14 PFT: FVC 3.69 (97%), FEV1  2.47 (90%),  DLCOunc 14.09 (49%).   Preoperative labs noted. Cr 4.05. H/H 11.8/35.9. PT/INR 25.6/2.36, PTT 43. A1C 5.9. Glucose 99.   Repeat PT/PTT and get an ISTAT on the day of surgery.  Myra Gianotti, PA-C [facility]/Anesthesiology Phone [phone] 02/12/2015 10:00 AM

## 2015-02-15 MED ORDER — POTASSIUM CHLORIDE 2 MEQ/ML IV SOLN
80.0000 meq | INTRAVENOUS | Status: DC
  Filled 2015-02-15: qty 40

## 2015-02-15 MED ORDER — SODIUM CHLORIDE 0.9 % IV SOLN
INTRAVENOUS | Status: AC
  Administered 2015-02-16: 2 [IU]/h via INTRAVENOUS
  Filled 2015-02-15: qty 2.5

## 2015-02-15 MED ORDER — DEXTROSE 5 % IV SOLN
30.0000 ug/min | INTRAVENOUS | Status: AC
  Administered 2015-02-16: 50 ug/min via INTRAVENOUS
  Filled 2015-02-15: qty 2

## 2015-02-15 MED ORDER — DEXTROSE 5 % IV SOLN
0.0000 ug/min | INTRAVENOUS | Status: AC
  Administered 2015-02-16: 2 ug/min via INTRAVENOUS
  Filled 2015-02-15: qty 4

## 2015-02-15 MED ORDER — DEXTROSE 5 % IV SOLN
1.5000 g | INTRAVENOUS | Status: AC
  Administered 2015-02-16: 1.5 g via INTRAVENOUS
  Filled 2015-02-15: qty 1.5

## 2015-02-15 MED ORDER — MAGNESIUM SULFATE 50 % IJ SOLN
40.0000 meq | INTRAMUSCULAR | Status: DC
  Filled 2015-02-15: qty 10

## 2015-02-15 MED ORDER — NITROGLYCERIN IN D5W 200-5 MCG/ML-% IV SOLN
2.0000 ug/min | INTRAVENOUS | Status: DC
  Filled 2015-02-15: qty 250

## 2015-02-15 MED ORDER — SODIUM CHLORIDE 0.9 % IV SOLN
INTRAVENOUS | Status: DC
  Filled 2015-02-15: qty 30

## 2015-02-15 MED ORDER — VANCOMYCIN HCL 10 G IV SOLR
1250.0000 mg | INTRAVENOUS | Status: AC
  Administered 2015-02-16: 1250 mg via INTRAVENOUS
  Filled 2015-02-15: qty 1250

## 2015-02-15 MED ORDER — DEXMEDETOMIDINE HCL IN NACL 400 MCG/100ML IV SOLN
0.1000 ug/kg/h | INTRAVENOUS | Status: AC
  Administered 2015-02-16: .5 ug/kg/h via INTRAVENOUS
  Filled 2015-02-15: qty 100

## 2015-02-15 MED ORDER — DOPAMINE-DEXTROSE 3.2-5 MG/ML-% IV SOLN
0.0000 ug/kg/min | INTRAVENOUS | Status: DC
  Filled 2015-02-15: qty 250

## 2015-02-15 MED ORDER — DEXTROSE 5 % IV SOLN
0.0000 ug/min | INTRAVENOUS | Status: DC
  Filled 2015-02-15: qty 4

## 2015-02-16 ENCOUNTER — Inpatient Hospital Stay (HOSPITAL_COMMUNITY): Payer: MEDICARE | Admitting: Certified Registered Nurse Anesthetist

## 2015-02-16 ENCOUNTER — Encounter (HOSPITAL_COMMUNITY): Payer: Self-pay | Admitting: Thoracic Surgery (Cardiothoracic Vascular Surgery)

## 2015-02-16 ENCOUNTER — Inpatient Hospital Stay (HOSPITAL_COMMUNITY): Payer: MEDICARE

## 2015-02-16 ENCOUNTER — Inpatient Hospital Stay (HOSPITAL_COMMUNITY)
Admission: RE | Admit: 2015-02-16 | Discharge: 2015-03-01 | DRG: 266 | Disposition: A | Discharge status: Skilled Nursing Facility | Payer: MEDICARE | Source: Ambulatory Visit | Attending: Thoracic Surgery (Cardiothoracic Vascular Surgery) | Admitting: Thoracic Surgery (Cardiothoracic Vascular Surgery)

## 2015-02-16 ENCOUNTER — Inpatient Hospital Stay (HOSPITAL_COMMUNITY): Payer: MEDICARE | Admitting: Vascular Surgery

## 2015-02-16 ENCOUNTER — Encounter (HOSPITAL_COMMUNITY)
Admission: RE | Disposition: A | Discharge status: Skilled Nursing Facility | Payer: MEDICARE | Source: Ambulatory Visit | Attending: Thoracic Surgery (Cardiothoracic Vascular Surgery)

## 2015-02-16 DIAGNOSIS — I4891 Unspecified atrial fibrillation: Secondary | ICD-10-CM | POA: Diagnosis present

## 2015-02-16 DIAGNOSIS — Z7901 Long term (current) use of anticoagulants: Secondary | ICD-10-CM | POA: Diagnosis not present

## 2015-02-16 DIAGNOSIS — I350 Nonrheumatic aortic (valve) stenosis: Secondary | ICD-10-CM

## 2015-02-16 DIAGNOSIS — Z8249 Family history of ischemic heart disease and other diseases of the circulatory system: Secondary | ICD-10-CM

## 2015-02-16 DIAGNOSIS — D72829 Elevated white blood cell count, unspecified: Secondary | ICD-10-CM | POA: Diagnosis not present

## 2015-02-16 DIAGNOSIS — I481 Persistent atrial fibrillation: Secondary | ICD-10-CM | POA: Diagnosis present

## 2015-02-16 DIAGNOSIS — I132 Hypertensive heart and chronic kidney disease with heart failure and with stage 5 chronic kidney disease, or end stage renal disease: Secondary | ICD-10-CM | POA: Diagnosis present

## 2015-02-16 DIAGNOSIS — Z952 Presence of prosthetic heart valve: Secondary | ICD-10-CM

## 2015-02-16 DIAGNOSIS — I700 Atherosclerosis of aorta: Secondary | ICD-10-CM | POA: Diagnosis present

## 2015-02-16 DIAGNOSIS — R0600 Dyspnea, unspecified: Secondary | ICD-10-CM | POA: Diagnosis present

## 2015-02-16 DIAGNOSIS — Z888 Allergy status to other drugs, medicaments and biological substances status: Secondary | ICD-10-CM

## 2015-02-16 DIAGNOSIS — D6959 Other secondary thrombocytopenia: Secondary | ICD-10-CM | POA: Diagnosis not present

## 2015-02-16 DIAGNOSIS — D62 Acute posthemorrhagic anemia: Secondary | ICD-10-CM | POA: Diagnosis not present

## 2015-02-16 DIAGNOSIS — I2510 Atherosclerotic heart disease of native coronary artery without angina pectoris: Secondary | ICD-10-CM | POA: Diagnosis present

## 2015-02-16 DIAGNOSIS — Z885 Allergy status to narcotic agent status: Secondary | ICD-10-CM | POA: Diagnosis not present

## 2015-02-16 DIAGNOSIS — I472 Ventricular tachycardia: Secondary | ICD-10-CM | POA: Diagnosis not present

## 2015-02-16 DIAGNOSIS — N186 End stage renal disease: Secondary | ICD-10-CM | POA: Diagnosis present

## 2015-02-16 DIAGNOSIS — Z8679 Personal history of other diseases of the circulatory system: Secondary | ICD-10-CM

## 2015-02-16 DIAGNOSIS — Z006 Encounter for examination for normal comparison and control in clinical research program: Secondary | ICD-10-CM

## 2015-02-16 DIAGNOSIS — I080 Rheumatic disorders of both mitral and aortic valves: Secondary | ICD-10-CM | POA: Diagnosis present

## 2015-02-16 DIAGNOSIS — I10 Essential (primary) hypertension: Secondary | ICD-10-CM | POA: Diagnosis present

## 2015-02-16 DIAGNOSIS — N2581 Secondary hyperparathyroidism of renal origin: Secondary | ICD-10-CM | POA: Diagnosis present

## 2015-02-16 DIAGNOSIS — I482 Chronic atrial fibrillation: Secondary | ICD-10-CM | POA: Diagnosis present

## 2015-02-16 DIAGNOSIS — K529 Noninfective gastroenteritis and colitis, unspecified: Secondary | ICD-10-CM | POA: Diagnosis present

## 2015-02-16 DIAGNOSIS — I959 Hypotension, unspecified: Secondary | ICD-10-CM | POA: Diagnosis not present

## 2015-02-16 DIAGNOSIS — Z954 Presence of other heart-valve replacement: Secondary | ICD-10-CM | POA: Diagnosis not present

## 2015-02-16 DIAGNOSIS — Z992 Dependence on renal dialysis: Secondary | ICD-10-CM | POA: Diagnosis not present

## 2015-02-16 DIAGNOSIS — I509 Heart failure, unspecified: Secondary | ICD-10-CM

## 2015-02-16 DIAGNOSIS — J90 Pleural effusion, not elsewhere classified: Secondary | ICD-10-CM | POA: Diagnosis not present

## 2015-02-16 DIAGNOSIS — N185 Chronic kidney disease, stage 5: Secondary | ICD-10-CM | POA: Diagnosis present

## 2015-02-16 DIAGNOSIS — H9190 Unspecified hearing loss, unspecified ear: Secondary | ICD-10-CM | POA: Diagnosis present

## 2015-02-16 DIAGNOSIS — I4519 Other right bundle-branch block: Secondary | ICD-10-CM | POA: Diagnosis present

## 2015-02-16 DIAGNOSIS — I5043 Acute on chronic combined systolic (congestive) and diastolic (congestive) heart failure: Secondary | ICD-10-CM | POA: Diagnosis not present

## 2015-02-16 DIAGNOSIS — Z9689 Presence of other specified functional implants: Secondary | ICD-10-CM

## 2015-02-16 DIAGNOSIS — I421 Obstructive hypertrophic cardiomyopathy: Secondary | ICD-10-CM | POA: Diagnosis present

## 2015-02-16 DIAGNOSIS — J9601 Acute respiratory failure with hypoxia: Secondary | ICD-10-CM | POA: Diagnosis not present

## 2015-02-16 DIAGNOSIS — D6489 Other specified anemias: Secondary | ICD-10-CM | POA: Diagnosis present

## 2015-02-16 DIAGNOSIS — J9811 Atelectasis: Secondary | ICD-10-CM

## 2015-02-16 DIAGNOSIS — Z906 Acquired absence of other parts of urinary tract: Secondary | ICD-10-CM | POA: Diagnosis not present

## 2015-02-16 DIAGNOSIS — I5032 Chronic diastolic (congestive) heart failure: Secondary | ICD-10-CM | POA: Diagnosis present

## 2015-02-16 DIAGNOSIS — Z8551 Personal history of malignant neoplasm of bladder: Secondary | ICD-10-CM

## 2015-02-16 DIAGNOSIS — Z87891 Personal history of nicotine dependence: Secondary | ICD-10-CM

## 2015-02-16 DIAGNOSIS — I951 Orthostatic hypotension: Secondary | ICD-10-CM | POA: Diagnosis present

## 2015-02-16 DIAGNOSIS — I739 Peripheral vascular disease, unspecified: Secondary | ICD-10-CM | POA: Diagnosis present

## 2015-02-16 DIAGNOSIS — F05 Delirium due to known physiological condition: Secondary | ICD-10-CM | POA: Diagnosis not present

## 2015-02-16 DIAGNOSIS — I272 Other secondary pulmonary hypertension: Secondary | ICD-10-CM | POA: Diagnosis present

## 2015-02-16 DIAGNOSIS — I953 Hypotension of hemodialysis: Secondary | ICD-10-CM | POA: Diagnosis not present

## 2015-02-16 DIAGNOSIS — Z974 Presence of external hearing-aid: Secondary | ICD-10-CM | POA: Diagnosis not present

## 2015-02-16 DIAGNOSIS — R0989 Other specified symptoms and signs involving the circulatory and respiratory systems: Secondary | ICD-10-CM

## 2015-02-16 HISTORY — DX: Presence of prosthetic heart valve: Z95.2

## 2015-02-16 HISTORY — PX: TRANSCATHETER AORTIC VALVE REPLACEMENT, TRANSAPICAL: SHX6401

## 2015-02-16 HISTORY — PX: TEE WITHOUT CARDIOVERSION: SHX5443

## 2015-02-16 LAB — APTT
APTT: 27 s (ref 24–37)
aPTT: 27 seconds (ref 24–37)

## 2015-02-16 LAB — POCT I-STAT 3, ART BLOOD GAS (G3+)
ACID-BASE DEFICIT: 3 mmol/L — AB (ref 0.0–2.0)
ACID-BASE DEFICIT: 2 mmol/L (ref 0.0–2.0)
ACID-BASE DEFICIT: 6 mmol/L — AB (ref 0.0–2.0)
Acid-base deficit: 6 mmol/L — ABNORMAL HIGH (ref 0.0–2.0)
BICARBONATE: 19.7 meq/L — AB (ref 20.0–24.0)
BICARBONATE: 18.6 meq/L — AB (ref 20.0–24.0)
Bicarbonate: 21.8 mEq/L (ref 20.0–24.0)
Bicarbonate: 23.8 mEq/L (ref 20.0–24.0)
O2 SAT: 97 %
O2 SAT: 97 %
O2 SAT: 99 %
O2 SAT: 96 %
PH ART: 7.357 (ref 7.350–7.450)
PH ART: 7.326 — AB (ref 7.350–7.450)
PO2 ART: 96 mmHg (ref 80.0–100.0)
PO2 ART: 92 mmHg (ref 80.0–100.0)
PO2 ART: 85 mmHg (ref 80.0–100.0)
Patient temperature: 36.6
TCO2: 23 mmol/L (ref 0–100)
TCO2: 25 mmol/L (ref 0–100)
TCO2: 21 mmol/L (ref 0–100)
TCO2: 20 mmol/L (ref 0–100)
pCO2 arterial: 38.5 mmHg (ref 35.0–45.0)
pCO2 arterial: 42.4 mmHg (ref 35.0–45.0)
pCO2 arterial: 37.8 mmHg (ref 35.0–45.0)
pCO2 arterial: 35.2 mmHg (ref 35.0–45.0)
pH, Arterial: 7.361 (ref 7.350–7.450)
pH, Arterial: 7.333 — ABNORMAL LOW (ref 7.350–7.450)
pO2, Arterial: 129 mmHg — ABNORMAL HIGH (ref 80.0–100.0)

## 2015-02-16 LAB — POCT I-STAT, CHEM 8
BUN: 33 mg/dL — ABNORMAL HIGH (ref 6–20)
BUN: 34 mg/dL — AB (ref 6–20)
BUN: 33 mg/dL — AB (ref 6–20)
BUN: 35 mg/dL — AB (ref 6–20)
CALCIUM ION: 1.11 mmol/L — AB (ref 1.13–1.30)
CALCIUM ION: 1.13 mmol/L (ref 1.13–1.30)
CALCIUM ION: 1.12 mmol/L — AB (ref 1.13–1.30)
CALCIUM ION: 1.14 mmol/L (ref 1.13–1.30)
CHLORIDE: 101 mmol/L (ref 101–111)
CHLORIDE: 102 mmol/L (ref 101–111)
CREATININE: 3.9 mg/dL — AB (ref 0.61–1.24)
CREATININE: 3.8 mg/dL — AB (ref 0.61–1.24)
CREATININE: 4 mg/dL — AB (ref 0.61–1.24)
CREATININE: 4.2 mg/dL — AB (ref 0.61–1.24)
Chloride: 102 mmol/L (ref 101–111)
Chloride: 105 mmol/L (ref 101–111)
GLUCOSE: 161 mg/dL — AB (ref 65–99)
GLUCOSE: 108 mg/dL — AB (ref 65–99)
GLUCOSE: 124 mg/dL — AB (ref 65–99)
Glucose, Bld: 107 mg/dL — ABNORMAL HIGH (ref 65–99)
HCT: 33 % — ABNORMAL LOW (ref 39.0–52.0)
HCT: 32 % — ABNORMAL LOW (ref 39.0–52.0)
HCT: 35 % — ABNORMAL LOW (ref 39.0–52.0)
HEMATOCRIT: 31 % — AB (ref 39.0–52.0)
HEMOGLOBIN: 10.5 g/dL — AB (ref 13.0–17.0)
Hemoglobin: 11.2 g/dL — ABNORMAL LOW (ref 13.0–17.0)
Hemoglobin: 10.9 g/dL — ABNORMAL LOW (ref 13.0–17.0)
Hemoglobin: 11.9 g/dL — ABNORMAL LOW (ref 13.0–17.0)
POTASSIUM: 3.4 mmol/L — AB (ref 3.5–5.1)
Potassium: 3.7 mmol/L (ref 3.5–5.1)
Potassium: 4 mmol/L (ref 3.5–5.1)
Potassium: 4.5 mmol/L (ref 3.5–5.1)
SODIUM: 138 mmol/L (ref 135–145)
SODIUM: 137 mmol/L (ref 135–145)
Sodium: 137 mmol/L (ref 135–145)
Sodium: 137 mmol/L (ref 135–145)
TCO2: 25 mmol/L (ref 0–100)
TCO2: 21 mmol/L (ref 0–100)
TCO2: 22 mmol/L (ref 0–100)
TCO2: 17 mmol/L (ref 0–100)

## 2015-02-16 LAB — CBC
HEMATOCRIT: 33.4 % — AB (ref 39.0–52.0)
HEMATOCRIT: 34.5 % — AB (ref 39.0–52.0)
HEMOGLOBIN: 11.1 g/dL — AB (ref 13.0–17.0)
Hemoglobin: 11 g/dL — ABNORMAL LOW (ref 13.0–17.0)
MCH: 35.9 pg — AB (ref 26.0–34.0)
MCH: 34.3 pg — ABNORMAL HIGH (ref 26.0–34.0)
MCHC: 33.2 g/dL (ref 30.0–36.0)
MCHC: 31.9 g/dL (ref 30.0–36.0)
MCV: 108.1 fL — AB (ref 78.0–100.0)
MCV: 107.5 fL — AB (ref 78.0–100.0)
Platelets: 165 10*3/uL (ref 150–400)
Platelets: 160 10*3/uL (ref 150–400)
RBC: 3.09 MIL/uL — ABNORMAL LOW (ref 4.22–5.81)
RBC: 3.21 MIL/uL — ABNORMAL LOW (ref 4.22–5.81)
RDW: 15.4 % (ref 11.5–15.5)
RDW: 15.2 % (ref 11.5–15.5)
WBC: 11.2 10*3/uL — ABNORMAL HIGH (ref 4.0–10.5)
WBC: 10.3 10*3/uL (ref 4.0–10.5)

## 2015-02-16 LAB — POCT I-STAT 4, (NA,K, GLUC, HGB,HCT)
GLUCOSE: 104 mg/dL — AB (ref 65–99)
GLUCOSE: 118 mg/dL — AB (ref 65–99)
HCT: 33 % — ABNORMAL LOW (ref 39.0–52.0)
HEMATOCRIT: 39 % (ref 39.0–52.0)
HEMOGLOBIN: 13.3 g/dL (ref 13.0–17.0)
HEMOGLOBIN: 11.2 g/dL — AB (ref 13.0–17.0)
POTASSIUM: 3.8 mmol/L (ref 3.5–5.1)
Potassium: 4 mmol/L (ref 3.5–5.1)
SODIUM: 137 mmol/L (ref 135–145)
Sodium: 138 mmol/L (ref 135–145)

## 2015-02-16 LAB — PREPARE RBC (CROSSMATCH)

## 2015-02-16 LAB — GLUCOSE, CAPILLARY
GLUCOSE-CAPILLARY: 100 mg/dL — AB (ref 65–99)
GLUCOSE-CAPILLARY: 105 mg/dL — AB (ref 65–99)
GLUCOSE-CAPILLARY: 84 mg/dL (ref 65–99)
Glucose-Capillary: 115 mg/dL — ABNORMAL HIGH (ref 65–99)
Glucose-Capillary: 102 mg/dL — ABNORMAL HIGH (ref 65–99)
Glucose-Capillary: 98 mg/dL (ref 65–99)
Glucose-Capillary: 106 mg/dL — ABNORMAL HIGH (ref 65–99)
Glucose-Capillary: 93 mg/dL (ref 65–99)

## 2015-02-16 LAB — PROTIME-INR
INR: 1.16 (ref 0.00–1.49)
INR: 1.45 (ref 0.00–1.49)
PROTHROMBIN TIME: 15 s (ref 11.6–15.2)
Prothrombin Time: 17.7 seconds — ABNORMAL HIGH (ref 11.6–15.2)

## 2015-02-16 LAB — CREATININE, SERUM
Creatinine, Ser: 2.05 mg/dL — ABNORMAL HIGH (ref 0.61–1.24)
GFR calc Af Amer: 35 mL/min — ABNORMAL LOW (ref >60)
GFR calc non Af Amer: 30 mL/min — ABNORMAL LOW (ref >60)

## 2015-02-16 LAB — MAGNESIUM: MAGNESIUM: 0.9 mg/dL — AB (ref 1.7–2.4)

## 2015-02-16 SURGERY — REPLACEMENT, AORTIC VALVE, TRANSAPICAL APPROACH
Anesthesia: General | Site: Chest

## 2015-02-16 MED ORDER — METOPROLOL TARTRATE 1 MG/ML IV SOLN
2.5000 mg | INTRAVENOUS | Status: DC | PRN
  Administered 2015-02-16: 2.5 mg via INTRAVENOUS
  Filled 2015-02-16: qty 5

## 2015-02-16 MED ORDER — EPINEPHRINE HCL 1 MG/ML IJ SOLN
INTRAMUSCULAR | Status: AC
  Filled 2015-02-16: qty 2

## 2015-02-16 MED ORDER — PROPOFOL 10 MG/ML IV BOLUS
INTRAVENOUS | Status: AC
  Filled 2015-02-16: qty 20

## 2015-02-16 MED ORDER — ACETAMINOPHEN 160 MG/5ML PO SOLN: 650.0000 mg | Freq: Once | ORAL | Status: AC

## 2015-02-16 MED ORDER — DEXMEDETOMIDINE HCL IN NACL 200 MCG/50ML IV SOLN
0.1000 ug/kg/h | INTRAVENOUS | Status: DC
Start: 2015-02-16 — End: 2015-02-16

## 2015-02-16 MED ORDER — SODIUM CHLORIDE 0.9 % IJ SOLN
3.0000 mL | Freq: Two times a day (BID) | INTRAMUSCULAR | Status: DC
  Administered 2015-02-16 – 2015-02-19 (×3): 3 mL via INTRAVENOUS

## 2015-02-16 MED ORDER — INSULIN REGULAR BOLUS VIA INFUSION
0.0000 [IU] | Freq: Three times a day (TID) | INTRAVENOUS | Status: DC
  Filled 2015-02-16: qty 10

## 2015-02-16 MED ORDER — HEPARIN SODIUM (PORCINE) 1000 UNIT/ML IJ SOLN
INTRAMUSCULAR | Status: AC
  Filled 2015-02-16: qty 2

## 2015-02-16 MED ORDER — EPINEPHRINE HCL 1 MG/ML IJ SOLN
INTRAMUSCULAR | Status: DC | PRN
  Administered 2015-02-16: 1 mg

## 2015-02-16 MED ORDER — CALCIUM CHLORIDE 10 % IV SOLN
INTRAVENOUS | Status: DC | PRN
  Administered 2015-02-16: 200 mg via INTRAVENOUS

## 2015-02-16 MED ORDER — CETYLPYRIDINIUM CHLORIDE 0.05 % MT LIQD
7.0000 mL | Freq: Two times a day (BID) | OROMUCOSAL | Status: DC
  Administered 2015-02-16 – 2015-03-01 (×18): 7 mL via OROMUCOSAL

## 2015-02-16 MED ORDER — CHLORHEXIDINE GLUCONATE 0.12% ORAL RINSE (MEDLINE KIT): 15.0000 mL | Freq: Two times a day (BID) | OROMUCOSAL | Status: DC

## 2015-02-16 MED ORDER — SODIUM CHLORIDE 0.9 % IV SOLN
INTRAVENOUS | Status: DC | PRN
  Administered 2015-02-16 (×3): via INTRAVENOUS

## 2015-02-16 MED ORDER — INSULIN REGULAR HUMAN 100 UNIT/ML IJ SOLN
INTRAMUSCULAR | Status: DC
  Filled 2015-02-16: qty 2.5

## 2015-02-16 MED ORDER — FENTANYL CITRATE (PF) 100 MCG/2ML IJ SOLN
INTRAMUSCULAR | Status: DC | PRN
  Administered 2015-02-16 (×2): 50 ug via INTRAVENOUS

## 2015-02-16 MED ORDER — SODIUM CHLORIDE 0.9 % IJ SOLN
3.0000 mL | INTRAMUSCULAR | Status: DC | PRN
  Administered 2015-02-16: 3 mL via INTRAVENOUS
  Filled 2015-02-16: qty 3

## 2015-02-16 MED ORDER — MIDAZOLAM HCL 2 MG/2ML IJ SOLN: 2.0000 mg | INTRAMUSCULAR | Status: DC | PRN

## 2015-02-16 MED ORDER — HEPARIN SODIUM (PORCINE) 1000 UNIT/ML IJ SOLN
INTRAMUSCULAR | Status: DC | PRN
  Administered 2015-02-16: 11000 [IU] via INTRAVENOUS

## 2015-02-16 MED ORDER — SODIUM CHLORIDE 0.9 % IV SOLN: Freq: Once | INTRAVENOUS | Status: DC

## 2015-02-16 MED ORDER — MORPHINE SULFATE (PF) 2 MG/ML IV SOLN
1.0000 mg | INTRAVENOUS | Status: AC | PRN
  Administered 2015-02-16 (×2): 2 mg via INTRAVENOUS
  Administered 2015-02-16: 4 mg via INTRAVENOUS
  Administered 2015-02-16 (×2): 2 mg via INTRAVENOUS
  Filled 2015-02-16: qty 1

## 2015-02-16 MED ORDER — CISATRACURIUM BESYLATE (PF) 10 MG/5ML IV SOLN
INTRAVENOUS | Status: DC | PRN
  Administered 2015-02-16: 4 mg via INTRAVENOUS
  Administered 2015-02-16: 16 mg via INTRAVENOUS

## 2015-02-16 MED ORDER — ACETAMINOPHEN 160 MG/5ML PO SOLN: 1000.0000 mg | Freq: Four times a day (QID) | ORAL | Status: DC

## 2015-02-16 MED ORDER — NOREPINEPHRINE BITARTRATE 1 MG/ML IV SOLN: 3.0000 ug/min | INTRAVENOUS | Status: DC

## 2015-02-16 MED ORDER — PROTAMINE SULFATE 10 MG/ML IV SOLN
INTRAVENOUS | Status: DC | PRN
  Administered 2015-02-16: 50 mg via INTRAVENOUS
  Administered 2015-02-16: 60 mg via INTRAVENOUS

## 2015-02-16 MED ORDER — DEXTROSE 5 % IV SOLN
0.0000 ug/min | INTRAVENOUS | Status: DC
  Administered 2015-02-17: 6 ug/min via INTRAVENOUS
  Administered 2015-02-17: 14 ug/min via INTRAVENOUS
  Filled 2015-02-16 (×4): qty 4

## 2015-02-16 MED ORDER — VANCOMYCIN HCL IN DEXTROSE 1-5 GM/200ML-% IV SOLN: 1000.0000 mg | Freq: Once | INTRAVENOUS | Status: DC

## 2015-02-16 MED ORDER — ASPIRIN 81 MG PO CHEW: 324.0000 mg | CHEWABLE_TABLET | Freq: Every day | ORAL | Status: DC

## 2015-02-16 MED ORDER — DILTIAZEM HCL 100 MG IV SOLR
5.0000 mg/h | INTRAVENOUS | Status: AC
  Administered 2015-02-16: 5 mg/h via INTRAVENOUS
  Filled 2015-02-16: qty 100

## 2015-02-16 MED ORDER — MORPHINE SULFATE (PF) 2 MG/ML IV SOLN
2.0000 mg | INTRAVENOUS | Status: DC | PRN
  Administered 2015-02-16: 4 mg via INTRAVENOUS
  Administered 2015-02-17 (×2): 2 mg via INTRAVENOUS
  Filled 2015-02-16: qty 1
  Filled 2015-02-16 (×2): qty 2
  Filled 2015-02-16 (×4): qty 1

## 2015-02-16 MED ORDER — MAGNESIUM SULFATE 4 GM/100ML IV SOLN: 4.0000 g | Freq: Once | INTRAVENOUS | Status: AC

## 2015-02-16 MED ORDER — DEXTROSE 5 % IV SOLN
1.5000 g | INTRAVENOUS | Status: AC
  Administered 2015-02-16 – 2015-02-17 (×2): 1.5 g via INTRAVENOUS
  Filled 2015-02-16 (×2): qty 1.5

## 2015-02-16 MED ORDER — CHLORHEXIDINE GLUCONATE 0.12 % MT SOLN
15.0000 mL | Freq: Once | OROMUCOSAL | Status: DC
  Filled 2015-02-16: qty 15

## 2015-02-16 MED ORDER — FAMOTIDINE IN NACL 20-0.9 MG/50ML-% IV SOLN
20.0000 mg | Freq: Two times a day (BID) | INTRAVENOUS | Status: AC
  Administered 2015-02-16: 20 mg via INTRAVENOUS

## 2015-02-16 MED ORDER — EPHEDRINE SULFATE 50 MG/ML IJ SOLN
INTRAMUSCULAR | Status: AC
  Filled 2015-02-16: qty 1

## 2015-02-16 MED ORDER — ALBUMIN HUMAN 5 % IV SOLN
INTRAVENOUS | Status: DC | PRN
  Administered 2015-02-16: 10:00:00 via INTRAVENOUS

## 2015-02-16 MED ORDER — CISATRACURIUM BESYLATE 20 MG/10ML IV SOLN
INTRAVENOUS | Status: AC
  Filled 2015-02-16: qty 10

## 2015-02-16 MED ORDER — SUCCINYLCHOLINE CHLORIDE 20 MG/ML IJ SOLN
INTRAMUSCULAR | Status: AC
  Filled 2015-02-16: qty 1

## 2015-02-16 MED ORDER — ONDANSETRON HCL 4 MG/2ML IJ SOLN
INTRAMUSCULAR | Status: AC
  Filled 2015-02-16: qty 2

## 2015-02-16 MED ORDER — MIDAZOLAM HCL 5 MG/5ML IJ SOLN
INTRAMUSCULAR | Status: DC | PRN
  Administered 2015-02-16: 1 mg via INTRAVENOUS

## 2015-02-16 MED ORDER — ASPIRIN EC 325 MG PO TBEC
325.0000 mg | DELAYED_RELEASE_TABLET | Freq: Every day | ORAL | Status: DC
  Administered 2015-02-17 – 2015-02-18 (×2): 325 mg via ORAL
  Filled 2015-02-16 (×3): qty 1

## 2015-02-16 MED ORDER — ACETAMINOPHEN 500 MG PO TABS
1000.0000 mg | ORAL_TABLET | Freq: Four times a day (QID) | ORAL | Status: AC
  Administered 2015-02-16 – 2015-02-21 (×17): 1000 mg via ORAL
  Filled 2015-02-16 (×20): qty 2

## 2015-02-16 MED ORDER — OXYCODONE HCL 5 MG PO TABS: 5.0000 mg | ORAL_TABLET | ORAL | Status: DC | PRN

## 2015-02-16 MED ORDER — DILTIAZEM HCL 100 MG IV SOLR: 5.0000 mg/h | INTRAVENOUS | Status: DC

## 2015-02-16 MED ORDER — HEPARIN SODIUM (PORCINE) 5000 UNIT/ML IJ SOLN
INTRAMUSCULAR | Status: DC | PRN
  Administered 2015-02-16: 1500 mL

## 2015-02-16 MED ORDER — SODIUM CHLORIDE 0.9 % IV SOLN: 250.0000 mL | INTRAVENOUS | Status: DC | PRN

## 2015-02-16 MED ORDER — PANTOPRAZOLE SODIUM 40 MG PO TBEC
40.0000 mg | DELAYED_RELEASE_TABLET | Freq: Every day | ORAL | Status: DC
  Administered 2015-02-18 – 2015-03-01 (×12): 40 mg via ORAL
  Filled 2015-02-16 (×13): qty 1

## 2015-02-16 MED ORDER — IODIXANOL 320 MG/ML IV SOLN
INTRAVENOUS | Status: DC | PRN
Start: 2015-02-16 — End: 2015-02-16
  Administered 2015-02-16: 60.5 mL via INTRAVENOUS

## 2015-02-16 MED ORDER — DEXTROSE 5 % IV SOLN
0.0000 ug/min | INTRAVENOUS | Status: DC
  Administered 2015-02-16: 70 ug/min via INTRAVENOUS
  Administered 2015-02-17: 30 ug/min via INTRAVENOUS
  Filled 2015-02-16 (×3): qty 2

## 2015-02-16 MED ORDER — CEFUROXIME SODIUM 1.5 G IJ SOLR
1.5000 g | Freq: Two times a day (BID) | INTRAMUSCULAR | Status: DC
  Filled 2015-02-16: qty 1.5

## 2015-02-16 MED ORDER — PROPOFOL 10 MG/ML IV BOLUS
INTRAVENOUS | Status: DC | PRN
  Administered 2015-02-16: 70 mg via INTRAVENOUS

## 2015-02-16 MED ORDER — ALBUMIN HUMAN 5 % IV SOLN
250.0000 mL | INTRAVENOUS | Status: AC | PRN
  Administered 2015-02-16 – 2015-02-17 (×3): 250 mL via INTRAVENOUS
  Filled 2015-02-16: qty 250

## 2015-02-16 MED ORDER — SODIUM CHLORIDE 0.9 % IJ SOLN
INTRAMUSCULAR | Status: DC | PRN
  Administered 2015-02-16: 10 mL via INTRAVENOUS

## 2015-02-16 MED ORDER — NITROGLYCERIN IN D5W 200-5 MCG/ML-% IV SOLN: 0.0000 ug/min | INTRAVENOUS | Status: DC

## 2015-02-16 MED ORDER — FENTANYL CITRATE (PF) 250 MCG/5ML IJ SOLN
INTRAMUSCULAR | Status: AC
  Filled 2015-02-16: qty 5

## 2015-02-16 MED ORDER — SODIUM CHLORIDE 0.9 % IV SOLN: INTRAVENOUS | Status: DC

## 2015-02-16 MED ORDER — CHLORHEXIDINE GLUCONATE 4 % EX LIQD: 30.0000 mL | CUTANEOUS | Status: DC

## 2015-02-16 MED ORDER — INSULIN ASPART 100 UNIT/ML ~~LOC~~ SOLN
0.0000 [IU] | SUBCUTANEOUS | Status: DC
  Administered 2015-02-17 – 2015-02-18 (×2): 2 [IU] via SUBCUTANEOUS

## 2015-02-16 MED ORDER — ROCURONIUM BROMIDE 50 MG/5ML IV SOLN
INTRAVENOUS | Status: AC
  Filled 2015-02-16: qty 1

## 2015-02-16 MED ORDER — LIDOCAINE HCL (CARDIAC) 20 MG/ML IV SOLN
INTRAVENOUS | Status: DC | PRN
  Administered 2015-02-16: 50 mg via INTRAVENOUS

## 2015-02-16 MED ORDER — STERILE WATER FOR INJECTION IJ SOLN
INTRAMUSCULAR | Status: AC
  Filled 2015-02-16: qty 10

## 2015-02-16 MED ORDER — CHLORHEXIDINE GLUCONATE 4 % EX LIQD: 60.0000 mL | Freq: Once | CUTANEOUS | Status: DC

## 2015-02-16 MED ORDER — SODIUM CHLORIDE 0.9 % IV SOLN
1.0000 mL/kg/h | INTRAVENOUS | Status: AC
  Administered 2015-02-16: 1 mL/kg/h via INTRAVENOUS

## 2015-02-16 MED ORDER — ANTISEPTIC ORAL RINSE SOLUTION (CORINZ)
7.0000 mL | Freq: Four times a day (QID) | OROMUCOSAL | Status: DC
  Administered 2015-02-16: 7 mL via OROMUCOSAL

## 2015-02-16 MED ORDER — MAGNESIUM SULFATE 4 GM/100ML IV SOLN
INTRAVENOUS | Status: AC
  Administered 2015-02-16: 4 g
  Filled 2015-02-16: qty 100

## 2015-02-16 MED ORDER — LACTATED RINGERS IV SOLN: 500.0000 mL | Freq: Once | INTRAVENOUS | Status: DC | PRN

## 2015-02-16 MED ORDER — ARTIFICIAL TEARS OP OINT
TOPICAL_OINTMENT | OPHTHALMIC | Status: AC
  Filled 2015-02-16: qty 3.5

## 2015-02-16 MED ORDER — 0.9 % SODIUM CHLORIDE (POUR BTL) OPTIME
TOPICAL | Status: DC | PRN
  Administered 2015-02-16: 6000 mL

## 2015-02-16 MED ORDER — ACETAMINOPHEN 650 MG RE SUPP
650.0000 mg | Freq: Once | RECTAL | Status: AC
  Administered 2015-02-16: 650 mg via RECTAL

## 2015-02-16 MED ORDER — MILRINONE IN DEXTROSE 20 MG/100ML IV SOLN
0.2000 ug/kg/min | INTRAVENOUS | Status: DC
  Administered 2015-02-16 – 2015-02-17 (×2): 0.2 ug/kg/min via INTRAVENOUS
  Filled 2015-02-16 (×2): qty 100

## 2015-02-16 MED ORDER — MIDAZOLAM HCL 2 MG/2ML IJ SOLN
INTRAMUSCULAR | Status: AC
  Filled 2015-02-16: qty 4

## 2015-02-16 MED ORDER — ONDANSETRON HCL 4 MG/2ML IJ SOLN
4.0000 mg | Freq: Four times a day (QID) | INTRAMUSCULAR | Status: DC | PRN
  Administered 2015-02-16: 4 mg via INTRAVENOUS
  Filled 2015-02-16 (×2): qty 2

## 2015-02-16 MED FILL — Sodium Chloride IV Soln 0.9%: INTRAVENOUS | Qty: 1000 | Status: AC

## 2015-02-16 SURGICAL SUPPLY — 112 items
ADAPTER UNIV SWAN GANZ BIP (ADAPTER) ×2 IMPLANT
ADAPTER UNV SWAN GANZ BIP (ADAPTER) ×1
ADH SKN CLS APL DERMABOND .7 (GAUZE/BANDAGES/DRESSINGS) ×2
ANTEGRADE CPLG (MISCELLANEOUS) IMPLANT
BAG BANDED W/RUBBER/TAPE 36X54 (MISCELLANEOUS) ×3 IMPLANT
BAG DECANTER FOR FLEXI CONT (MISCELLANEOUS) ×3 IMPLANT
BAG EQP BAND 135X91 W/RBR TAPE (MISCELLANEOUS) ×1
BAG SNAP BAND KOVER 36X36 (MISCELLANEOUS) ×6 IMPLANT
BLADE OSCILLATING /SAGITTAL (BLADE) IMPLANT
BLADE STERNUM SYSTEM 6 (BLADE) ×3 IMPLANT
BLADE SURG ROTATE 9660 (MISCELLANEOUS) ×3 IMPLANT
CABLE PACING FASLOC BIEGE (MISCELLANEOUS) IMPLANT
CABLE PACING FASLOC BLUE (MISCELLANEOUS) ×3 IMPLANT
CANNULA FEM VENOUS REMOTE 22FR (CANNULA) IMPLANT
CANNULA OPTISITE PERFUSION 16F (CANNULA) IMPLANT
CANNULA OPTISITE PERFUSION 18F (CANNULA) IMPLANT
CANNULA SUMP PERICARDIAL (CANNULA) ×3 IMPLANT
CATH DIAG EXPO 6F VENT PIG 145 (CATHETERS) ×3 IMPLANT
CATH EXPO 5FR FR4 (CATHETERS) ×3 IMPLANT
CATH S G BIP PACING (SET/KITS/TRAYS/PACK) ×6 IMPLANT
CATH STRAIGHT 5FR 65CM (CATHETERS) ×3 IMPLANT
CLIP TI MEDIUM 6 (CLIP) ×3 IMPLANT
CLIP TI WIDE RED SMALL 6 (CLIP) ×3 IMPLANT
CONN ST 1/4X3/8  BEN (MISCELLANEOUS) ×1
CONN ST 1/4X3/8 BEN (MISCELLANEOUS) ×2 IMPLANT
CONT SPECI 4OZ STER CLIK (MISCELLANEOUS) ×3 IMPLANT
COVER BACK TABLE 24X17X13 BIG (DRAPES) ×3 IMPLANT
COVER DOME SNAP 22 D (MISCELLANEOUS) ×3 IMPLANT
COVER TABLE BACK 60X90 (DRAPES) ×3 IMPLANT
CRADLE DONUT ADULT HEAD (MISCELLANEOUS) ×3 IMPLANT
DERMABOND ADVANCED (GAUZE/BANDAGES/DRESSINGS) ×1
DERMABOND ADVANCED .7 DNX12 (GAUZE/BANDAGES/DRESSINGS) ×2 IMPLANT
DRAIN CHANNEL 28F RND 3/8 FF (WOUND CARE) ×3 IMPLANT
DRAPE INCISE IOBAN 66X45 STRL (DRAPES) ×3 IMPLANT
DRAPE SLUSH MACHINE 52X66 (DRAPES) ×3 IMPLANT
DRAPE SLUSH/WARMER DISC (DRAPES) IMPLANT
DRAPE SURG IRRIG POUCH 19X23 (DRAPES) ×3 IMPLANT
DRSG TEGADERM 4X4.75 (GAUZE/BANDAGES/DRESSINGS) ×6 IMPLANT
ELECT BLADE 6.5 EXT (BLADE) ×3 IMPLANT
ELECT REM PT RETURN 9FT ADLT (ELECTROSURGICAL) ×6
ELECTRODE REM PT RTRN 9FT ADLT (ELECTROSURGICAL) ×4 IMPLANT
FELT TEFLON 6X6 (MISCELLANEOUS) ×3 IMPLANT
FILTER STRAW FLUID ASPIR (MISCELLANEOUS) ×3 IMPLANT
GAUZE SPONGE 4X4 12PLY STRL (GAUZE/BANDAGES/DRESSINGS) ×3 IMPLANT
GLOVE BIO SURGEON STRL SZ 6.5 (GLOVE) ×9 IMPLANT
GLOVE BIO SURGEON STRL SZ7.5 (GLOVE) ×3 IMPLANT
GLOVE BIO SURGEON STRL SZ8 (GLOVE) ×3 IMPLANT
GLOVE BIOGEL PI IND STRL 6.5 (GLOVE) ×10 IMPLANT
GLOVE BIOGEL PI INDICATOR 6.5 (GLOVE) ×5
GLOVE ECLIPSE 8.0 STRL XLNG CF (GLOVE) ×6 IMPLANT
GLOVE EUDERMIC 7 POWDERFREE (GLOVE) ×3 IMPLANT
GLOVE ORTHO TXT STRL SZ7.5 (GLOVE) ×6 IMPLANT
GLOVE PROGUARD SZ 7 1/2 (GLOVE) ×3 IMPLANT
GOWN STRL REUS W/ TWL LRG LVL3 (GOWN DISPOSABLE) ×12 IMPLANT
GOWN STRL REUS W/ TWL XL LVL3 (GOWN DISPOSABLE) ×12 IMPLANT
GOWN STRL REUS W/TWL LRG LVL3 (GOWN DISPOSABLE) ×6
GOWN STRL REUS W/TWL XL LVL3 (GOWN DISPOSABLE) ×6
GUIDEWIRE SAF TJ AMPL .035X180 (WIRE) ×6 IMPLANT
GUIDEWIRE SAFE TJ AMPLATZ EXST (WIRE) ×3 IMPLANT
HEMOSTAT POWDER SURGIFOAM 1G (HEMOSTASIS) IMPLANT
INSERT FOGARTY SM (MISCELLANEOUS) ×3 IMPLANT
INSERT FOGARTY XLG (MISCELLANEOUS) ×3 IMPLANT
KIT DILATOR VASC 18G NDL (KITS) IMPLANT
KIT HEART LEFT (KITS) ×3 IMPLANT
KIT ROOM TURNOVER OR (KITS) ×3 IMPLANT
KIT SUCTION CATH 14FR (SUCTIONS) ×9 IMPLANT
LEAD PACING MYOCARDI (MISCELLANEOUS) ×3 IMPLANT
NEEDLE 22X1 1/2 (OR ONLY) (NEEDLE) ×6 IMPLANT
NEEDLE PERC 18GX7CM (NEEDLE) ×3 IMPLANT
NS IRRIG 1000ML POUR BTL (IV SOLUTION) ×18 IMPLANT
PACK AORTA (CUSTOM PROCEDURE TRAY) ×3 IMPLANT
PAD ARMBOARD 7.5X6 YLW CONV (MISCELLANEOUS) ×6 IMPLANT
PAD ELECT DEFIB RADIOL ZOLL (MISCELLANEOUS) ×3 IMPLANT
RETRACTOR TRL SOFT TISSUE LG (INSTRUMENTS) IMPLANT
RETRACTOR TRM SOFT TISSUE 7.5 (INSTRUMENTS) IMPLANT
SHEATH PINNACLE 6F 10CM (SHEATH) ×9 IMPLANT
SLEEVE REPOSITIONING LENGTH 30 (MISCELLANEOUS) ×3 IMPLANT
SPONGE GAUZE 4X4 12PLY STER LF (GAUZE/BANDAGES/DRESSINGS) ×3 IMPLANT
SPONGE LAP 4X18 X RAY DECT (DISPOSABLE) ×3 IMPLANT
STOPCOCK 4 WAY LG BORE MALE ST (IV SETS) ×3 IMPLANT
STOPCOCK MORSE 400PSI 3WAY (MISCELLANEOUS) ×3 IMPLANT
SUT BONE WAX W31G (SUTURE) ×3 IMPLANT
SUT ETHIBOND X763 2 0 SH 1 (SUTURE) ×3 IMPLANT
SUT MNCRL AB 3-0 PS2 18 (SUTURE) ×3 IMPLANT
SUT PROLENE 2 0 MH 48 (SUTURE) ×6 IMPLANT
SUT PROLENE 4 0 RB 1 (SUTURE) ×1
SUT PROLENE 4-0 RB1 .5 CRCL 36 (SUTURE) ×2 IMPLANT
SUT SILK  1 MH (SUTURE) ×2
SUT SILK 1 MH (SUTURE) ×4 IMPLANT
SUT SILK 2 0 SH CR/8 (SUTURE) ×3 IMPLANT
SUT VIC AB 1 CTX 36 (SUTURE) ×1
SUT VIC AB 1 CTX36XBRD ANBCTR (SUTURE) ×2 IMPLANT
SUT VIC AB 2-0 CTX 36 (SUTURE) ×6 IMPLANT
SUT VIC AB 3-0 SH 8-18 (SUTURE) ×9 IMPLANT
SUT VICRYL 2 TP 1 (SUTURE) IMPLANT
SYR 30ML LL (SYRINGE) ×6 IMPLANT
SYR 3ML LL SCALE MARK (SYRINGE) ×3 IMPLANT
SYR 50ML LL SCALE MARK (SYRINGE) ×3 IMPLANT
SYR 5ML LL (SYRINGE) ×3 IMPLANT
SYR CONTROL 10ML LL (SYRINGE) ×3 IMPLANT
SYR MEDRAD MARK V 150ML (SYRINGE) ×6 IMPLANT
SYRINGE 10CC LL (SYRINGE) ×3 IMPLANT
SYSTEM SAHARA CHEST DRAIN ATS (WOUND CARE) ×3 IMPLANT
TAPE CLOTH SURG 4X10 WHT LF (GAUZE/BANDAGES/DRESSINGS) ×3 IMPLANT
TOWEL OR 17X26 10 PK STRL BLUE (TOWEL DISPOSABLE) ×6 IMPLANT
TRANSDUCER W/STOPCOCK (MISCELLANEOUS) ×6 IMPLANT
TRAY FOLEY IC TEMP SENS 16FR (CATHETERS) ×3 IMPLANT
TUBING ART PRESS 72  MALE/FEM (TUBING) ×1
TUBING ART PRESS 72 MALE/FEM (TUBING) ×2 IMPLANT
TUBING HIGH PRESSURE 120CM (CONNECTOR) ×3 IMPLANT
VALVE HRT TRANSCATH NOVAFL 29M (Valve) ×3 IMPLANT
WIRE J 3MM .035X145CM (WIRE) ×6 IMPLANT

## 2015-02-16 NOTE — Op Note (Signed)
CARDIOTHORACIC SURGERY OPERATIVE NOTE  Date of Procedure:  02/16/2015  Preoperative Diagnosis: Severe Aortic Stenosis   Postoperative Diagnosis: Same   Procedure:    Transcatheter Aortic Valve Replacement - Transapical Approach  Edwards Sapien XT THV (size 29 mm, model # 9300TFX, serial # [id number])   Co-Surgeons:  Valentina Gu. 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 The patient was originally seen in consultation on 12/15/2014 at which time the patient was hospitalized for an acute exacerbation of chronic diastolic congestive heart failure. He was evaluated by a multidisciplinary team of specialists during that hospitalization and diagnosed with stage D severe symptomatic aortic stenosis. He is felt to be a poor candidate for conventional surgical aortic valve replacement because of his advanced age and numerous comorbid medical problems including dialysis-dependent renal failure. In addition, the patient has moderate to severe calcification of the entire ascending thoracic aorta. Plans were made  for transcatheter aortic valve replacement as a far less invasive and potentially less risky alternative to extreme risk conventional surgery. The patient has been seen in consultation and counseled at length regarding the indications, risks and potential benefits of surgery.  All questions have been answered, and the patient provides full informed consent for the operation as described.    DETAILS OF THE OPERATIVE PROCEDURE  PREPARATION:    The patient is brought to the operating room on the above mentioned date and central monitoring was established by the anesthesia team including placement of Swan-Ganz catheter and radial arterial line. The patient is placed in the supine position on the operating table.  Intravenous antibiotics are administered. General endotracheal anesthesia is induced uneventfully. A Foley catheter is placed.  Baseline transesophageal echocardiogram was performed.  Findings were notable for severe aortic stenosis, moderate mitral regurgitation, mild mitral stenosis and low normal LV systolic function.  The patient's chest, abdomen, both groins, and both lower extremities are prepared and draped in a sterile manner. A time out procedure is performed.   PERIPHERAL ACCESS:    Femoral arterial and venous access is obtained with placement of 6 Fr arterial and venous sheaths on the right side.  A pigtail diagnostic catheter is passed through the right femoral arterial sheath under fluoroscopic guidance into the aortic root.  A temporary transvenous pacemaker catheter is passed through the right femoral venous sheath under fluoroscopic guidance into the right ventricle.  The pacemaker is tested to ensure stable lead placement and pacemaker capture.   TRANSAPICAL ACCESS:   The location of the left ventricular apex is confirmed using flouroscopy, and a miniature left thoracotomy incision is made directly over the left ventricular apex.  The left pleural space is entered.  No  adhesions are encountered.  A soft tissue retractor is placed and  the ribs gently spread to expose the pericardial surface.  A longitudinal incision is made in the pericardium and the left ventricular apex inspected.  There are no intrapericardial adhesions.  An appropriate site for left ventricular sheath placement is chosen well lateral to the left anterior descending coronary artery and verified using TEE.  Two pursestring sutures are placed using pledgeted 2-0 Prolene suture in a double diamond orientation.   The patient is heparinized systemically and ACT verified > 250 seconds.  The left ventricular apex is punctured using an 18 gauge needle and a soft J-tipped guidewire is passed into the left ventricle and through the aortic valve under fluoroscopic guidance while also continuously monitoring TEE for signs of entrapment in the mitral apparatus.  A 6 Fr sheath is placed over the guidewire and across the aortic valve.  A JR-4 catheter is passed through the sheath and maneuvered around the aortic arch into the descending thoracic aorta under fluoroscopic guidance.  An Amplatz extra stiff guidewire is passed through the JR-4 catheter into the descending thoracic aorta and both the JR-4 catheter and the introducing sheath are removed.  A 24 French Edwards Ascendra 3 introducer sheath is passed over the guidewire into the left ventricle.  The sheath position is continuously monitored and secured by the surgical assistant.  Baseline simultaneous left ventricular and aortic pressures are recorded.   TRANSCATHETER HEART VALVE DEPLOYMENT:  An [name] XT transcatheter heart valve (size 29 mm, model # 9300TFX, serial #[id number]) is prepared and crimped per manufacturer's guidelines, and the proper orientation of the valve is confirmed on the [employer] 3 delivery system.  The delivery system loader is advanced into the introducing sheath.  The valve and delivery system are advanced through the loader  into the sheath and all air is evacuated.  The valve and balloon are advanced into the left ventricle and part way through the aortic valve.  The valve pusher is retracted.  The valve is then finely positioned in the aortic valve. Preliminary valve position is confirmed using TEE, and care is taken to make certain there is no sign of entanglement in the mitral apparatus.  Final valve position is verified using aortic root angiography during rapid pacing.  Once final position of the valve has been confirmed, the valve is deployed while temporarily holding ventilation and during rapid ventricular pacing to maintain systolic blood pressure < 50 mmHg and pulse pressure < 10 mmHg.   The balloon inflation is held for >3 seconds after reaching full deployment volume.  Once the balloon has fully deflated the balloon is retracted into the left ventricle and valve function is assessed using TEE.  There is felt to be moderate paravalvular leak and no central aortic insufficiency.  Left ventricular function is  unchanged from preoperatively.  There is moderate mitral regurgitation.  The patient's hemodynamic recovery following valve deployment is uneventful   Post-deployment dilatation is performed using the deployment balloon with 1.0 mL extra volume added to the balloon during rapid pacing.  Following post-deployment dilatation the degree of paravalvular leak noted by TEE was somewhat improved but still moderate (2+) in severity.  The deployment balloon and guidewire are both removed and simultaneous left ventricular and aortic pressures are recorded.   PROCEDURE COMPLETION:  The left ventricular sheath is removed during rapid ventricular pacing to maintain systolic blood pressure < 70 mmHg while the pursestring sutures are tied.  The apical closure is inspected and notably intact.  Protamine is administered.  Once hemostasis has been ascertained, the left pleural space is drained using a single 28 Fr Bard chest  tube and the mini thoracotomy incision is closed in layers and the skin incision closed using a subcuticular skin closure.   The pigtail catheter and arterial femoral sheaths is removed.  The temporary pacemaker is left in place.  The patient tolerated the procedure well and is transported to the surgical intensive care in stable condition. There are no intraoperative complications. All sponge instrument and needle counts are verified correct at completion of the operation.  No blood products were administered during the operation.  The patient received a total of 60.5 mL of intravenous contrast during the procedure.    Valentina Gu. Roxy Manns MD 02/16/2015 10:10 AM

## 2015-02-16 NOTE — Anesthesia Preprocedure Evaluation (Addendum)
Anesthesia Evaluation  Patient identified by MRN, date of birth, ID band Patient awake    Reviewed: Allergy & Precautions, NPO status , Patient's Chart, lab work & pertinent test results  Airway Mallampati: II  TM Distance: >3 FB Neck ROM: Full    Dental  (+) Teeth Intact, Dental Advisory Given   Pulmonary former smoker,    breath sounds clear to auscultation       Cardiovascular hypertension,  Rhythm:Irregular Rate:Tachycardia     Neuro/Psych    GI/Hepatic   Endo/Other    Renal/GU      Musculoskeletal   Abdominal   Peds  Hematology   Anesthesia Other Findings   Reproductive/Obstetrics                            Anesthesia Physical Anesthesia Plan  ASA: III  Anesthesia Plan: General   Post-op Pain Management:    Induction: Intravenous  Airway Management Planned: Oral ETT  Additional Equipment: PA Cath, CVP and 3D TEE  Intra-op Plan:   Post-operative Plan:   Informed Consent: I have reviewed the patients History and Physical, chart, labs and discussed the procedure including the risks, benefits and alternatives for the proposed anesthesia with the patient or authorized representative who has indicated his/her understanding and acceptance.     Plan Discussed with: CRNA and Anesthesiologist  Anesthesia Plan Comments:         Anesthesia Quick Evaluation

## 2015-02-16 NOTE — Transfer of Care (Signed)
Immediate Anesthesia Transfer of Care Note  Patient: [name]  Procedure(s) Performed: Procedure(s): TRANSCATHETER AORTIC VALVE REPLACEMENT, TRANSAPICAL (N/A) TRANSESOPHAGEAL ECHOCARDIOGRAM (TEE) (N/A)  Patient Location: ICU  Anesthesia Type:General  Level of Consciousness: sedated  Airway & Oxygen Therapy: Patient remains intubated per anesthesia plan and Patient placed on Ventilator (see vital sign flow sheet for setting)  Post-op Assessment: Report given to RN and Post -op Vital signs reviewed and stable  Post vital signs: Reviewed and stable  Last Vitals:  Filed Vitals:   02/16/15 0721  BP:   Pulse:   Temp:   Resp: 31    Complications: No apparent anesthesia complications

## 2015-02-16 NOTE — Op Note (Addendum)
  [facility]  [facility] OPERATIVE NOTE Date of Procedure:  02/16/2015  Preoperative Diagnosis: Severe Aortic Stenosis   Postoperative Diagnosis: Same   Procedure:   Transcatheter Aortic Valve Replacement - Transapical Approach  Edwards Sapien XT THV (size 29 mm, serial # [id number])   Co-Surgeons:  Valentina Gu. Roxy Manns, MD and [name], MD  Assistants:   Gaye Pollack, MD   Anesthesiologist:  Roberts Gaudy, MD  Echocardiographer:  Jenkins Rouge, MD  Pre-operative Echo Findings:  Severe aortic stenosis  Normal left ventricular systolic function  Mild MR  Post-operative Echo Findings:  2+ paravalvular leak  Normal left ventricular systolic function  DETAILS OF THE OPERATIVE PROCEDURE  PERIPHERAL ACCESS:   Using the modified Seldinger technique, femoral arterial and venous access was obtained with placement of 6 Fr sheaths on the right side.  A pigtail diagnostic catheter was passed through the right femoral arterial sheath under fluoroscopic guidance into the aortic root.  A temporary transvenous pacemaker catheter was passed through the right femoral venous sheath under fluoroscopic guidance into the right ventricle.  The pacemaker was tested to ensure stable lead placement and pacemaker capture. Aortic root angiography was performed in order to determine the optimal angiographic angle for valve deployment.  PROCEDURE COMPLETION:  Femoral arterial sheath is removed with manual pressure for hemostasis. The right femoral venous sheath and temporary pacing wire are left in place at the completion of the procedure.  For detailed operative report, please see full note of Dr Roxy Manns.  [name] MD 02/16/2015 10:57 AM

## 2015-02-16 NOTE — Procedures (Signed)
Extubation Procedure Note  Patient Details:   Name: [name] DOB: November 23, 1940 MRN: [id number]   Airway Documentation:  Airway (Active)     Airway 8 mm (Active)  Secured at (cm) 21 cm 02/16/2015 10:45 AM  Measured From Lips 02/16/2015 10:45 AM  Secured Location Right 02/16/2015 10:45 AM  Secured By Rana Snare Tape 02/16/2015 10:45 AM    Evaluation  O2 sats: stable throughout Complications: No apparent complications Patient did tolerate procedure well. Bilateral Breath Sounds: Rhonchi Suctioning: Airway Yes  Pt extubated to [place] @ 4L.  No stridor noted.  RN @ bedside.  Donnetta Hail 02/16/2015, 3:18 PM

## 2015-02-16 NOTE — Progress Notes (Signed)
  Echocardiogram Echocardiogram Transesophageal has been performed.  Jennette Dubin 02/16/2015, 10:08 AM

## 2015-02-16 NOTE — Interval H&P Note (Signed)
History and Physical Interval Note:  02/16/2015 6:12 AM  [name]  has presented today for surgery, with the diagnosis of SEVERE AS  The various methods of treatment have been discussed with the patient and family. After consideration of risks, benefits and other options for treatment, the patient has consented to  Procedure(s): TRANSCATHETER AORTIC VALVE REPLACEMENT, TRANSAPICAL (N/A) TRANSESOPHAGEAL ECHOCARDIOGRAM (TEE) (N/A) as a surgical intervention .  The patient's history has been reviewed, patient examined, no change in status, stable for surgery.  I have reviewed the patient's chart and labs.  Questions were answered to the patient's satisfaction.     [name]

## 2015-02-16 NOTE — Progress Notes (Signed)
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  Patient s/p TAVR this am, he received 1250mg  of vancomycin prior to surgery. Given that he is CKD stage V with scr of 4, pre-op dose will last ~48 hours, no redose needed at this time. Will also change zinacef to q24 hours given dose tonight and tomorrow night.  No further dose adjustments expected. Will follow along peripherally.  Erin Hearing PharmD., BCPS Clinical Pharmacist Pager [phone] 02/16/2015 11:24 AM

## 2015-02-16 NOTE — Anesthesia Postprocedure Evaluation (Signed)
  Anesthesia Post-op Note  Patient: JAKAIDEN FILL Tine  Procedure(s) Performed: Procedure(s): TRANSCATHETER AORTIC VALVE REPLACEMENT, TRANSAPICAL (N/A) TRANSESOPHAGEAL ECHOCARDIOGRAM (TEE) (N/A)  Patient Location: SICU  Anesthesia Type:General  Level of Consciousness: Patient remains intubated per anesthesia plan  Airway and Oxygen Therapy: Patient remains intubated per anesthesia plan and Patient placed on Ventilator (see vital sign flow sheet for setting)  Post-op Pain: none  Post-op Assessment: Post-op Vital signs reviewed, Patient's Cardiovascular Status Stable, Respiratory Function Stable and Patent Airway              Post-op Vital Signs: stable  Last Vitals:  Filed Vitals:   02/16/15 1440  BP: 145/73  Pulse: 82  Temp:   Resp: 12    Complications: No apparent anesthesia complications

## 2015-02-16 NOTE — Progress Notes (Signed)
[facility] BRIEF SICU PROGRESS NOTE  Day of Surgery  S/P Procedure(s) (LRB): TRANSCATHETER AORTIC VALVE REPLACEMENT, TRANSAPICAL (N/A) TRANSESOPHAGEAL ECHOCARDIOGRAM (TEE) (N/A)   Extubated uneventfully.  Feels well except sore in chest Afib w/ controlled rate, no bradycardia MAP 60-70 on low dose levophed PA pressures stable 62/34 Cardiac index 1.6-1.8 O2 sats 99% on 4 L/min Extremities warm, adequately perfused  Plan: Will start low dose milrinone  [name], MD 02/16/2015 5:55 PM

## 2015-02-16 NOTE — Anesthesia Procedure Notes (Addendum)
Procedure Name: Intubation Date/Time: 02/16/2015 8:11 AM Performed by: [place] Pink Pre-anesthesia Checklist: Patient identified, Emergency Drugs available, Suction available, Patient being monitored and Timeout performed Patient Re-evaluated:Patient Re-evaluated prior to inductionOxygen Delivery Method: Circle system utilized Preoxygenation: Pre-oxygenation with 100% oxygen Intubation Type: IV induction Ventilation: Mask ventilation without difficulty and Oral airway inserted - appropriate to patient size Laryngoscope Size: Mac and 4 Grade View: Grade I Tube type: Oral Tube size: 8.0 mm Number of attempts: 1 Airway Equipment and Method: Stylet Placement Confirmation: ETT inserted through vocal cords under direct vision,  positive ETCO2 and breath sounds checked- equal and bilateral Secured at: 21 cm Tube secured with: Tape Dental Injury: Teeth and Oropharynx as per pre-operative assessment       The patient was identified and consent obtained.  TO was performed, and full barrier precautions were used.  The skin was anesthetized with lidocaine.  Once the vein was located with the 22 ga. needle using ultrasound guidance , the wire was inserted into the vein.  The wire location was confirmed with ultrasound.  The insertion site was dilated and the introducer was carefully inserted and sutured in place. The PAC was checked, and floated into the PA.  Once in the PA, the catheter was secured. The patient tolerated the procedure well.  CXR was ordered for PACU. Start: 2257 End: 5051 J. Tedra [place], MD

## 2015-02-16 NOTE — Consult Note (Signed)
[facility] Renal Consultation Note  Indication for Consultation:  Management of ESRD/hemodialysis; anemia, hypertension/volume and secondary hyperparathyroidism   HPI: [name] is a 74 y.o. male with Ho ESRD (HD MWF at [facility]) last as yesterday ,Severe AS ,  Mild MR, with nl LV function, Atrial Fibrillation  On warfarin , admitted for Transcatheter Aortic Valve Replacement - Transapical Approach by Dr.Owen and Dr. Burt Knack . Now postop in room, stable intubated. No reported problems at OP hemodialysis.     Past Medical History  Diagnosis Date  . Atrial fibrillation, persistent ([place])     a. cardioversion 11/21/10 . b. 05/2013 - experiencing more SOB. 24-hr holter showed avg HR 49, max 82, min 38bpm. ETT showed baseline junctional bradycardia, HR incr only to 90bpm with drop in BP. Saw EP - amiodarone reduced and eventually d/c'd with improvement in dyspnea.  . Hypertension   . Warfarin anticoagulation   . CKD (chronic kidney disease), stage V ([place])     a. Followed by Dr. Jimmy Footman. b. IV fistula placed 04/17/14. c. initiation of HD August 2016  . Aortic valve stenosis   . Orthostatic hypotension   . Diastolic dysfunction   . Arthritis   . Self-catheterizes urinary bladder   . Junctional bradycardia   . NSVT (nonsustained ventricular tachycardia) ([place])     a. By holter 01/2014.  [name] Shortness of breath dyspnea   . PVD (peripheral vascular disease) ([place])   . Cardiomyopathy ([place])     non-ischemic  . RTA (renal tubular acidosis)   . Hypomagnesemia   . A-V fistula ([place])   . CAD (coronary artery disease) 12/16/14    a. cath - nonobstrucive CAD 30% pro RCA; 25% dis RCA; 40T pro Lcx  . Heart murmur   . Dysrhythmia   . CHF (congestive heart failure) ([place])   . Full dentures   . Uses hearing aid   . Chronic diarrhea     takes imodium BID  . Pneumonia     hx of  . Hemodialysis patient [facility])     M,W,F - dialysis initiated August 2016  . Bruises easily   . Bladder cancer  ([place])     a. 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  . 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 Cardioversion  11/21/2010  . Cystectomy    . Cholecystectomy    . Cataract extraction w/ intraocular lens  implant, bilateral    . Colonoscopy    . Av fistula placement Left 04/17/2014    Procedure: ARTERIOVENOUS (AV) FISTULA CREATION- LEFT UPPER ARM ;  Surgeon: Mal [name], MD;  Location: [facility];  Service: Vascular;  Laterality: Left;  . Bladder surgery      radical cystectomy  . Vascular surgery    . Multiple tooth extractions    . Cardiac catheterization N/A 12/16/2014    Procedure: Right/Left Heart Cath and Coronary Angiography;  Surgeon: [name], MD;  Location: [facility];  Service: Cardiovascular;  Laterality: N/A;  . Eye surgery    . Colon surgery  Nov 2002    reconstructed bladder from colon. per pt. at [facility]  . Tee without cardioversion N/A 01/01/2015    Procedure: TRANSESOPHAGEAL ECHOCARDIOGRAM (TEE);  Surgeon: [name], MD;  Location: [facility];  Service: Cardiovascular;  Laterality: N/A;  Family History  Problem Relation Age of Onset  . CVA Mother   . Heart attack Father   . Heart disease Father   . Arrhythmia Father   . Alzheimer's disease Sister   . Multiple sclerosis Brother   . Alcohol abuse Father       reports that he quit smoking about 31 years ago. His smoking use included Cigarettes. He has a 66 pack-year smoking history. He has never used smokeless tobacco. He reports that he drinks alcohol. He reports that he does not use illicit drugs.   Allergies  Allergen Reactions  . Amiodarone Other (See Comments)    Severely reduced DLCO  . Oxycodone Hcl Rash and Other (See Comments)    Rash and itching    Prior to Admission medications   Medication Sig  Start Date End Date Taking? Authorizing Provider  acetaminophen (TYLENOL) 500 MG tablet Take 1,000 mg by mouth every 6 (six) hours as needed for mild pain or moderate pain.   Yes Historical Provider, MD  calcitRIOL (ROCALTROL) 0.25 MCG capsule Take 0.25 mcg by mouth as needed.    Yes Historical Provider, MD  diltiazem (CARDIZEM CD) 180 MG 24 hr capsule Take 180 mg by mouth at bedtime.   Yes Historical Provider, MD  enoxaparin (LOVENOX) 60 MG/0.6ML injection Inject 0.6 mLs (60 mg total) into the skin daily. Patient taking differently: Inject 60 mg into the skin daily. Inject on 10/13, 14, 15 and 16 only. 02/09/15  Yes [name], MD  Hypromellose (ARTIFICIAL TEARS OP) Place 1 drop into both eyes daily as needed (rinse eyes).    Yes Historical Provider, MD  Loperamide HCl (IMODIUM PO) Take 1 tablet by mouth 2 (two) times daily.   Yes Historical Provider, MD  metoprolol tartrate (LOPRESSOR) 25 MG tablet Take 75 mg by mouth every evening.    Yes Historical Provider, MD  Multiple Vitamins-Minerals (CENTRUM SILVER ULTRA MENS PO) Take 1 tablet by mouth daily. Take daily   Yes Historical Provider, MD  Pancrelipase, Lip-Prot-Amyl, (CREON) 24000 UNITS CPEP Take 48,000 Units by mouth 2 (two) times daily with a meal.    Yes Historical Provider, MD  sevelamer carbonate (RENVELA) 800 MG tablet 1,600 mg 3 (three) times daily with meals.    Yes Historical Provider, MD  warfarin (COUMADIN) 5 MG tablet Take 0.5 tablets (2.5 mg total) by mouth daily. Take as directed by coumadin clinic Patient taking differently: Take 2.5 mg by mouth daily. . 05/22/14  Yes Kinnie Feil, MD    IRW:ERXVQM chloride, albumin human, lactated ringers, metoprolol, midazolam, morphine injection, morphine injection, ondansetron (ZOFRAN) IV, oxyCODONE, sodium chloride  Results for orders placed or performed during the hospital encounter of 02/16/15 (from the past 48 hour(s))  I-STAT 4, (NA,K, GLUC, HGB,HCT)     Status: Abnormal    Collection Time: 02/16/15  6:38 AM  Result Value Ref Range   Sodium 137 135 - 145 mmol/L   Potassium 4.0 3.5 - 5.1 mmol/L   Glucose, Bld 104 (H) 65 - 99 mg/dL   HCT 39.0 39.0 - 52.0 %   Hemoglobin 13.3 13.0 - 17.0 g/dL  APTT     Status: None   Collection Time: 02/16/15  6:40 AM  Result Value Ref Range   aPTT 27 24 - 37 seconds  Protime-INR     Status: None   Collection Time: 02/16/15  6:40 AM  Result Value Ref Range   Prothrombin Time 15.0 11.6 - 15.2 seconds   INR  1.16 0.00 - 1.49  Prepare RBC     Status: None   Collection Time: 02/16/15  7:08 AM  Result Value Ref Range   Order Confirmation ORDER PROCESSED BY BLOOD BANK   I-STAT, chem 8     Status: Abnormal   Collection Time: 02/16/15  8:24 AM  Result Value Ref Range   Sodium 137 135 - 145 mmol/L   Potassium 3.4 (L) 3.5 - 5.1 mmol/L   Chloride 101 101 - 111 mmol/L   BUN 33 (H) 6 - 20 mg/dL   Creatinine, Ser 3.90 (H) 0.61 - 1.24 mg/dL   Glucose, Bld 161 (H) 65 - 99 mg/dL   Calcium, Ion 1.11 (L) 1.13 - 1.30 mmol/L   TCO2 25 0 - 100 mmol/L   Hemoglobin 11.2 (L) 13.0 - 17.0 g/dL   HCT 33.0 (L) 39.0 - 52.0 %  I-STAT, chem 8     Status: Abnormal   Collection Time: 02/16/15  9:24 AM  Result Value Ref Range   Sodium 138 135 - 145 mmol/L   Potassium 3.7 3.5 - 5.1 mmol/L   Chloride 102 101 - 111 mmol/L   BUN 34 (H) 6 - 20 mg/dL   Creatinine, Ser 3.80 (H) 0.61 - 1.24 mg/dL   Glucose, Bld 108 (H) 65 - 99 mg/dL   Calcium, Ion 1.13 1.13 - 1.30 mmol/L   TCO2 21 0 - 100 mmol/L   Hemoglobin 10.9 (L) 13.0 - 17.0 g/dL   HCT 32.0 (L) 39.0 - 52.0 %  I-STAT, chem 8     Status: Abnormal   Collection Time: 02/16/15  9:57 AM  Result Value Ref Range   Sodium 137 135 - 145 mmol/L   Potassium 4.0 3.5 - 5.1 mmol/L   Chloride 102 101 - 111 mmol/L   BUN 33 (H) 6 - 20 mg/dL   Creatinine, Ser 4.00 (H) 0.61 - 1.24 mg/dL   Glucose, Bld 124 (H) 65 - 99 mg/dL   Calcium, Ion 1.12 (L) 1.13 - 1.30 mmol/L   TCO2 22 0 - 100 mmol/L   Hemoglobin 10.5  (L) 13.0 - 17.0 g/dL   HCT 31.0 (L) 39.0 - 52.0 %  I-STAT 3, arterial blood gas (G3+)     Status: Abnormal   Collection Time: 02/16/15 10:02 AM  Result Value Ref Range   pH, Arterial 7.361 7.350 - 7.450   pCO2 arterial 38.5 35.0 - 45.0 mmHg   pO2, Arterial 96.0 80.0 - 100.0 mmHg   Bicarbonate 21.8 20.0 - 24.0 mEq/L   TCO2 23 0 - 100 mmol/L   O2 Saturation 97.0 %   Acid-base deficit 3.0 (H) 0.0 - 2.0 mmol/L   Patient temperature HIDE    Sample type ARTERIAL   I-STAT 4, (NA,K, GLUC, HGB,HCT)     Status: Abnormal   Collection Time: 02/16/15 10:57 AM  Result Value Ref Range   Sodium 138 135 - 145 mmol/L   Potassium 3.8 3.5 - 5.1 mmol/L   Glucose, Bld 118 (H) 65 - 99 mg/dL   HCT 33.0 (L) 39.0 - 52.0 %   Hemoglobin 11.2 (L) 13.0 - 17.0 g/dL  CBC     Status: Abnormal   Collection Time: 02/16/15 11:00 AM  Result Value Ref Range   WBC 11.2 (H) 4.0 - 10.5 K/uL   RBC 3.09 (L) 4.22 - 5.81 MIL/uL   Hemoglobin 11.1 (L) 13.0 - 17.0 g/dL   HCT 33.4 (L) 39.0 - 52.0 %   MCV 108.1 (H) 78.0 - 100.0  fL   MCH 35.9 (H) 26.0 - 34.0 pg   MCHC 33.2 30.0 - 36.0 g/dL   RDW 15.4 11.5 - 15.5 %   Platelets 165 150 - 400 K/uL  I-STAT 3, arterial blood gas (G3+)     Status: None   Collection Time: 02/16/15 11:01 AM  Result Value Ref Range   pH, Arterial 7.357 7.350 - 7.450   pCO2 arterial 42.4 35.0 - 45.0 mmHg   pO2, Arterial 92.0 80.0 - 100.0 mmHg   Bicarbonate 23.8 20.0 - 24.0 mEq/L   TCO2 25 0 - 100 mmol/L   O2 Saturation 97.0 %   Acid-base deficit 2.0 0.0 - 2.0 mmol/L   Patient temperature 36.6 C    Collection site ARTERIAL LINE    Drawn by Operator    Sample type ARTERIAL     ROS: Intubated, no communication by pt / but per ho exertional SOB and fatigue with minimal exertion, No CP , fevers,cough, or other positives reported on ROS.  Physical Exam: Filed Vitals:   02/16/15 1115  BP:   Pulse: 64  Temp: 98.1 F (36.7 C)  Resp: 18     General: Intubated , in room, elderly WM , WD , WN.   HEENT: [place], intubated  Neck: no jvd Heart: RRR, 3/6 hsm ,.no rub or gallop Lungs: Intubated lung sounds  Abdomen: bs + , soft , NT, ND Extremities: no pedal edema Skin: rare excoriation lower extrem. , no overt rash, warm dry Neuro: intubated post op , does not open eyes or respond to voice  Dialysis Access: LUA AVF  Dialysis Orders: Center: [place]  on MWF . EDW 77kg (leaving below 76.7 last 2 txs) HD Bath 3k/2.0ca  Time 4hr Heparin 4000. Access LUA AVF         Venofer  66mb q wed hd  Other op lab hgb 12.5 oct 14 , ca 9.2 phos 6.1  pth 213  Assessment/Plan 1.  Severe AS = SP TAVR 2. ESRD -  HD MWF op schedule k 3.8 (usually on 3 .0 k bath ) 3. Hypertension/volume  - stable , bp post op / wt 3 kgs below edw was leaving below edw as op  4. Anemia  - HGB 11.1 on weekly iron, no current esa  Fu hgb  5. Metabolic bone disease -  On Renvela binder , no vit d on hd  6. HO A. Fib - per card ( on coumadin as OP) 7. HO CAD / CM - card RX    Ernest Haber, PA-C [place] [phone] 02/16/2015, 11:28 AM    Pt seen, examined and agree w A/P as above. ESRD patient with severe AS underwent TAVRS procedure today with apical approach.  No complications.  Minimal fluid gain w procedure. At dry wt by weights.  No vol excess on exam.  Plan gentle HD tomorrow with minimal UF.   Kelly Splinter MD [employer] pager [phone]    cell [phone] 02/16/2015, 4:42 PM

## 2015-02-17 ENCOUNTER — Inpatient Hospital Stay (HOSPITAL_COMMUNITY): Payer: MEDICARE

## 2015-02-17 ENCOUNTER — Other Ambulatory Visit: Payer: Self-pay | Admitting: *Deleted

## 2015-02-17 ENCOUNTER — Encounter (HOSPITAL_COMMUNITY): Payer: Self-pay | Admitting: Thoracic Surgery (Cardiothoracic Vascular Surgery)

## 2015-02-17 DIAGNOSIS — Z952 Presence of prosthetic heart valve: Secondary | ICD-10-CM

## 2015-02-17 DIAGNOSIS — I350 Nonrheumatic aortic (valve) stenosis: Secondary | ICD-10-CM

## 2015-02-17 LAB — CBC
HCT: 33.4 % — ABNORMAL LOW (ref 39.0–52.0)
HEMATOCRIT: 31.7 % — AB (ref 39.0–52.0)
HEMOGLOBIN: 10.8 g/dL — AB (ref 13.0–17.0)
HEMOGLOBIN: 10.2 g/dL — AB (ref 13.0–17.0)
MCH: 35.1 pg — ABNORMAL HIGH (ref 26.0–34.0)
MCH: 34.9 pg — AB (ref 26.0–34.0)
MCHC: 32.3 g/dL (ref 30.0–36.0)
MCHC: 32.2 g/dL (ref 30.0–36.0)
MCV: 108.4 fL — AB (ref 78.0–100.0)
MCV: 108.6 fL — AB (ref 78.0–100.0)
PLATELETS: 138 10*3/uL — AB (ref 150–400)
Platelets: 127 10*3/uL — ABNORMAL LOW (ref 150–400)
RBC: 3.08 MIL/uL — AB (ref 4.22–5.81)
RBC: 2.92 MIL/uL — AB (ref 4.22–5.81)
RDW: 15.5 % (ref 11.5–15.5)
RDW: 15.4 % (ref 11.5–15.5)
WBC: 18.3 10*3/uL — AB (ref 4.0–10.5)
WBC: 15.2 10*3/uL — ABNORMAL HIGH (ref 4.0–10.5)

## 2015-02-17 LAB — BASIC METABOLIC PANEL
ANION GAP: 17 — AB (ref 5–15)
BUN: 39 mg/dL — ABNORMAL HIGH (ref 6–20)
CHLORIDE: 102 mmol/L (ref 101–111)
CO2: 17 mmol/L — ABNORMAL LOW (ref 22–32)
Calcium: 8.9 mg/dL (ref 8.9–10.3)
Creatinine, Ser: 4.67 mg/dL — ABNORMAL HIGH (ref 0.61–1.24)
GFR calc Af Amer: 13 mL/min — ABNORMAL LOW (ref >60)
GFR, EST NON AFRICAN AMERICAN: 11 mL/min — AB (ref >60)
Glucose, Bld: 139 mg/dL — ABNORMAL HIGH (ref 65–99)
POTASSIUM: 4.8 mmol/L (ref 3.5–5.1)
SODIUM: 136 mmol/L (ref 135–145)

## 2015-02-17 LAB — POCT I-STAT 3, ART BLOOD GAS (G3+)
ACID-BASE DEFICIT: 7 mmol/L — AB (ref 0.0–2.0)
Acid-base deficit: 2 mmol/L (ref 0.0–2.0)
BICARBONATE: 19.1 meq/L — AB (ref 20.0–24.0)
Bicarbonate: 22.5 mEq/L (ref 20.0–24.0)
O2 SAT: 84 %
O2 Saturation: 99 %
PO2 ART: 49 mmHg — AB (ref 80.0–100.0)
TCO2: 20 mmol/L (ref 0–100)
TCO2: 24 mmol/L (ref 0–100)
pCO2 arterial: 38.7 mmHg (ref 35.0–45.0)
pCO2 arterial: 36.6 mmHg (ref 35.0–45.0)
pH, Arterial: 7.301 — ABNORMAL LOW (ref 7.350–7.450)
pH, Arterial: 7.397 (ref 7.350–7.450)
pO2, Arterial: 165 mmHg — ABNORMAL HIGH (ref 80.0–100.0)

## 2015-02-17 LAB — POCT I-STAT, CHEM 8
BUN: 19 mg/dL (ref 6–20)
CALCIUM ION: 1.12 mmol/L — AB (ref 1.13–1.30)
CREATININE: 3.4 mg/dL — AB (ref 0.61–1.24)
Chloride: 98 mmol/L — ABNORMAL LOW (ref 101–111)
GLUCOSE: 135 mg/dL — AB (ref 65–99)
HCT: 32 % — ABNORMAL LOW (ref 39.0–52.0)
Hemoglobin: 10.9 g/dL — ABNORMAL LOW (ref 13.0–17.0)
Potassium: 4.3 mmol/L (ref 3.5–5.1)
Sodium: 134 mmol/L — ABNORMAL LOW (ref 135–145)
TCO2: 21 mmol/L (ref 0–100)

## 2015-02-17 LAB — GLUCOSE, CAPILLARY
GLUCOSE-CAPILLARY: 116 mg/dL — AB (ref 65–99)
GLUCOSE-CAPILLARY: 114 mg/dL — AB (ref 65–99)
GLUCOSE-CAPILLARY: 135 mg/dL — AB (ref 65–99)
GLUCOSE-CAPILLARY: 123 mg/dL — AB (ref 65–99)
Glucose-Capillary: 104 mg/dL — ABNORMAL HIGH (ref 65–99)
Glucose-Capillary: 107 mg/dL — ABNORMAL HIGH (ref 65–99)
Glucose-Capillary: 97 mg/dL (ref 65–99)

## 2015-02-17 LAB — MAGNESIUM
MAGNESIUM: 2.6 mg/dL — AB (ref 1.7–2.4)
Magnesium: 1.8 mg/dL (ref 1.7–2.4)

## 2015-02-17 MED ORDER — WARFARIN - PHYSICIAN DOSING INPATIENT
Freq: Every day | Status: DC
  Administered 2015-02-19: 1
  Administered 2015-02-23 – 2015-02-28 (×4)

## 2015-02-17 MED ORDER — PANCRELIPASE (LIP-PROT-AMYL) 12000-38000 UNITS PO CPEP
48000.0000 [IU] | ORAL_CAPSULE | Freq: Two times a day (BID) | ORAL | Status: DC
  Administered 2015-02-17 – 2015-03-01 (×23): 48000 [IU] via ORAL
  Filled 2015-02-17 (×26): qty 4

## 2015-02-17 MED ORDER — RENA-VITE PO TABS
1.0000 | ORAL_TABLET | Freq: Every day | ORAL | Status: DC
  Administered 2015-02-17 – 2015-02-28 (×12): 1 via ORAL
  Filled 2015-02-17 (×13): qty 1

## 2015-02-17 MED ORDER — LIDOCAINE-PRILOCAINE 2.5-2.5 % EX CREA: 1.0000 "application " | TOPICAL_CREAM | CUTANEOUS | Status: DC | PRN

## 2015-02-17 MED ORDER — SODIUM CHLORIDE 0.9 % IV SOLN: 100.0000 mL | INTRAVENOUS | Status: DC | PRN

## 2015-02-17 MED ORDER — METOPROLOL TARTRATE 12.5 MG HALF TABLET
12.5000 mg | ORAL_TABLET | Freq: Two times a day (BID) | ORAL | Status: DC
  Administered 2015-02-17 – 2015-03-01 (×19): 12.5 mg via ORAL
  Filled 2015-02-17 (×25): qty 1

## 2015-02-17 MED ORDER — SEVELAMER CARBONATE 800 MG PO TABS
1600.0000 mg | ORAL_TABLET | Freq: Three times a day (TID) | ORAL | Status: DC
  Administered 2015-02-17 – 2015-02-23 (×17): 1600 mg via ORAL
  Filled 2015-02-17 (×22): qty 2

## 2015-02-17 MED ORDER — LIDOCAINE HCL (PF) 1 % IJ SOLN
5.0000 mL | INTRAMUSCULAR | Status: DC | PRN
  Filled 2015-02-17: qty 5

## 2015-02-17 MED ORDER — DILTIAZEM HCL ER COATED BEADS 180 MG PO CP24
180.0000 mg | ORAL_CAPSULE | Freq: Every day | ORAL | Status: DC
  Administered 2015-02-17 – 2015-02-28 (×11): 180 mg via ORAL
  Filled 2015-02-17 (×13): qty 1

## 2015-02-17 MED ORDER — DILTIAZEM HCL 100 MG IV SOLR
5.0000 mg/h | INTRAVENOUS | Status: DC
  Administered 2015-02-17: 5 mg/h via INTRAVENOUS
  Filled 2015-02-17 (×3): qty 100

## 2015-02-17 MED ORDER — MORPHINE SULFATE (PF) 2 MG/ML IV SOLN
1.0000 mg | INTRAVENOUS | Status: DC | PRN
  Administered 2015-02-17 (×2): 2 mg via INTRAVENOUS
  Filled 2015-02-17 (×3): qty 1

## 2015-02-17 MED ORDER — PENTAFLUOROPROP-TETRAFLUOROETH EX AERO: 1.0000 "application " | INHALATION_SPRAY | CUTANEOUS | Status: DC | PRN

## 2015-02-17 MED ORDER — CALCITRIOL 0.25 MCG PO CAPS
0.2500 ug | ORAL_CAPSULE | Freq: Every day | ORAL | Status: DC
  Administered 2015-02-17 – 2015-02-24 (×8): 0.25 ug via ORAL
  Filled 2015-02-17 (×8): qty 1

## 2015-02-17 MED ORDER — ALTEPLASE 2 MG IJ SOLR: 2.0000 mg | Freq: Once | INTRAMUSCULAR | Status: DC | PRN

## 2015-02-17 MED ORDER — HYDROXYZINE HCL 10 MG PO TABS
10.0000 mg | ORAL_TABLET | Freq: Three times a day (TID) | ORAL | Status: DC | PRN
  Administered 2015-02-17 – 2015-02-19 (×6): 10 mg via ORAL
  Filled 2015-02-17 (×8): qty 1

## 2015-02-17 MED ORDER — HEPARIN SODIUM (PORCINE) 1000 UNIT/ML DIALYSIS
1000.0000 [IU] | INTRAMUSCULAR | Status: DC | PRN
  Filled 2015-02-17 (×2): qty 1

## 2015-02-17 MED ORDER — FENTANYL CITRATE (PF) 100 MCG/2ML IJ SOLN
25.0000 ug | INTRAMUSCULAR | Status: DC | PRN
Start: 2015-02-17 — End: 2015-02-24
  Administered 2015-02-19: 25 ug via INTRAVENOUS
  Filled 2015-02-17 (×2): qty 2

## 2015-02-17 MED ORDER — WARFARIN SODIUM 2.5 MG PO TABS
2.5000 mg | ORAL_TABLET | Freq: Every day | ORAL | Status: DC
  Administered 2015-02-17 – 2015-02-21 (×5): 2.5 mg via ORAL
  Filled 2015-02-17 (×7): qty 1

## 2015-02-17 MED FILL — Potassium Chloride Inj 2 mEq/ML: INTRAVENOUS | Qty: 40 | Status: AC

## 2015-02-17 MED FILL — Magnesium Sulfate Inj 50%: INTRAMUSCULAR | Qty: 10 | Status: AC

## 2015-02-17 MED FILL — Heparin Sodium (Porcine) Inj 1000 Unit/ML: INTRAMUSCULAR | Qty: 30 | Status: AC

## 2015-02-17 NOTE — Progress Notes (Signed)
      [address]       [place] [address]             [phone]       C/o itching. Has allergy to oxycodone and has been receiving morphine for pain\  BP 101/53 mmHg  Pulse 86  Temp(Src) 98.6 F (37 C) (Oral)  Resp 17  Wt 179 lb 14.3 oz (81.6 kg)  SpO2 84%   Intake/Output Summary (Last 24 hours) at 02/17/15 1835 Last data filed at 02/17/15 1600  Gross per 24 hour  Intake 2020.74 ml  Output   2490 ml  Net -469.26 ml    Still on levophed to support BP, on milrinone adn diltiazem which are contributing to hypotension  Will change pain meds Try vistaril for itching  Remo Lipps C. Roxan Hockey, MD [facility] [phone]

## 2015-02-17 NOTE — Progress Notes (Signed)
[address]       ,[place] [address]             [phone]        CARDIOTHORACIC SURGERY PROGRESS NOTE   R1 Day Post-Op Procedure(s) (LRB): TRANSCATHETER AORTIC VALVE REPLACEMENT, TRANSAPICAL (N/A) TRANSESOPHAGEAL ECHOCARDIOGRAM (TEE) (N/A)  Subjective: Looks good.  Sore in chest but o/w no complaints  Objective: Vital signs: BP Readings from Last 1 Encounters:  02/17/15 95/48   Pulse Readings from Last 1 Encounters:  02/17/15 126   Resp Readings from Last 1 Encounters:  02/17/15 12   Temp Readings from Last 1 Encounters:  02/17/15 97.9 F (36.6 C)     Hemodynamics: PAP: (54-74)/(26-54) 58/38 mmHg CO:  [2.6 L/min-4.7 L/min] 3.8 L/min CI:  [1.4 L/min/m2-2.5 L/min/m2] 2 L/min/m2  Physical Exam:  Rhythm:   Afib w/ HR 140-150  Breath sounds: clear  Heart sounds:  irregular  Incisions:  Dressing dry, intact  Abdomen:  Soft, non-distended, non-tender  Extremities:  Warm, well-perfused  Chest tubes:  Low volume thin serosanguinous output, no air leak    Intake/Output from previous day: 10/18 0701 - 10/19 0700 In: 4547.3 [I.V.:3697.3; IV Piggyback:850] Out: 920 [Urine:300; Blood:50; Chest Tube:570] Intake/Output this shift:    Lab Results:  CBC: Recent Labs  02/16/15 1645 02/17/15 0353  WBC 10.3 18.3*  HGB 11.0* 10.8*  HCT 34.5* 33.4*  PLT 160 138*    BMET:  Recent Labs  02/16/15 1640 02/16/15 1645 02/17/15 0353  NA 137  --  136  K 4.5  --  4.8  CL 105  --  102  CO2  --   --  17*  GLUCOSE 107*  --  139*  BUN 35*  --  39*  CREATININE 4.20* 2.05* 4.67*  CALCIUM  --   --  8.9     PT/INR:   Recent Labs  02/16/15 1100  LABPROT 17.7*  INR 1.45    CBG (last 3)   Recent Labs  02/16/15 1923 02/16/15 2359 02/17/15 0339  GLUCAP 84 104* 116*    ABG    Component Value Date/Time   PHART 7.301* 02/17/2015 0554   PCO2ART 38.7 02/17/2015 0554   PO2ART 165.0* 02/17/2015 0554   HCO3 19.1* 02/17/2015 0554   TCO2 20 02/17/2015 0554   ACIDBASEDEF 7.0* 02/17/2015 0554   O2SAT 99.0 02/17/2015 0554    EKG: Atrial fibrillation w/ HR 128 incomplete RBBB w/ QRS duration unchanged from pre-op   CXR: PORTABLE CHEST 1 VIEW  COMPARISON: 02/16/2015  FINDINGS: Interval extubation.  Cardiomegaly with pulmonary vascular congestion and suspected mild interstitial edema. Layering right pleural effusion. Left lung base is obscured.  Left chest tube. No pneumothorax is seen.  Prior IJ Swan-Ganz catheter terminates in the main pulmonary artery.  Prosthetic aortic valve.  IMPRESSION: Interval extubation.  Cardiomegaly with mild interstitial edema and layering right pleural effusion.  Left chest tube. No pneumothorax is seen.   Electronically Signed  By: Julian Hy M.D.  On: 02/17/2015 08:12   Assessment/Plan: S/P Procedure(s) (LRB): TRANSCATHETER AORTIC VALVE REPLACEMENT, TRANSAPICAL (N/A) TRANSESOPHAGEAL ECHOCARDIOGRAM (TEE) (N/A)  Overall stable POD1 Afib w/ elevated HR, incomplete RBBB unchanged from preop Hemodynamics stable on low dose milrinone but still on low dose Levophed and Neo for BP support Acute exacerbation of chronic combined systolic and diastolic CHF, mild pulm edema, small right pleural effusion Pulmonary hypertension, moderate Expected post op volume excess, weight up 7 kg 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  Chronic anemia w/ minimal post op acute blood loss anemia Chronic persistent atrial fibrillation w/ history of LA thrombus   Will restart IV diltiazem for HR control in ICU  HD per Nephrology team - may require frequent gentle HD or CVVHD for volume removal due to labile hemodynamics  Restart oral diltiazem tonight and wean drip as tolerated  Restart metoprolol at reduced dose  Wean Neo and Levophed as tolerated  Continue low dose milrinone today  D/C swan ganz and mobilize patient  D/C chest tube once drainage decreases  Restart  coumadin  I spent in excess of 30 minutes during the conduct of this hospital encounter and >50% of this time involved direct face-to-face encounter with the patient for counseling and/or coordination of their care.   [name], MD 02/17/2015 8:45 AM

## 2015-02-17 NOTE — Progress Notes (Signed)
The patients pulse-ox had previously been inaccurate compared to ABG results.  Pt is now acutely confused, oximetry site changed and continues to show low spo2.  ABG obtained showing po2-49, pt placed on non-rebreather, spo2 and confusion improved, will continue to monitor.

## 2015-02-17 NOTE — Progress Notes (Signed)
[facility] Progress Note   Subjective: alert, post op pain, no SOB  Filed Vitals:   02/17/15 0826 02/17/15 0845 02/17/15 0900 02/17/15 0915  BP: 95/48 127/69 116/61 103/63  Pulse: 126 142 138 136  Temp:      TempSrc:      Resp:      Weight:      SpO2:       Exam: Alert, no distress no jvd RRR, 3/6 hsm ,.no rub or gallop Chest clear bilat bs + , soft , NT, ND Ext no pedal edema rare excoriation lower extrem. , no overt rash, warm dry Neuro alert, Ox 3 LUA AVF +bruit  Dialysis Orders: Center: [place] on MWF . EDW 77kg (leaving below 76.7 last 2 txs) HD Bath 3k/2.0ca Time 4hr Heparin 4000. Access LUA AVF   Venofer 42mb q wed hd  Other op lab hgb 12.5 oct 14 , ca 9.2 phos 6.1 pth 213  Assessment/Plan 1. Severe AS = SP TAVR 2. ESRD - HD MWF op schedule k 3.8 (usually on 3 .0 k bath ) 3. Vol up 3-4 kg by wts, asymptomatic 4. Anemia - HGB 11.1 on weekly iron, no current esa Fu hgb  5. MBD cont Renvela 6. HO A. Fib - per card ( on coumadin as OP) 7. HO CAD / CM - card RX  Plan - HD today , minimal UF if tolerated   Kelly Splinter MD [place] pager [phone]    cell [phone] 02/17/2015, 9:18 AM    Recent Labs Lab 02/11/15 0956  02/16/15 0957 02/16/15 1057 02/16/15 1640 02/16/15 1645 02/17/15 0353  NA 138  < > 137 138 137  --  136  K 4.2  < > 4.0 3.8 4.5  --  4.8  CL 99*  < > 102  --  105  --  102  CO2 25  --   --   --   --   --  17*  GLUCOSE 99  < > 124* 118* 107*  --  139*  BUN 36*  < > 33*  --  35*  --  39*  CREATININE 4.05*  < > 4.00*  --  4.20* 2.05* 4.67*  CALCIUM 9.4  --   --   --   --   --  8.9  < > = values in this interval not displayed.  Recent Labs Lab 02/11/15 0956  AST 33  ALT 25  ALKPHOS 62  BILITOT 0.9  PROT 6.5  ALBUMIN 3.5    Recent Labs Lab 02/16/15 1100 02/16/15 1640 02/16/15 1645 02/17/15 0353  WBC 11.2*  --  10.3 18.3*  HGB 11.1* 11.9* 11.0* 10.8*  HCT 33.4* 35.0* 34.5*  33.4*  MCV 108.1*  --  107.5* 108.4*  PLT 165  --  160 138*   . acetaminophen  1,000 mg Oral 4 times per day  . antiseptic oral rinse  7 mL Mouth Rinse BID  . aspirin EC  325 mg Oral Daily  . calcitRIOL  0.25 mcg Oral Daily  . cefUROXime (ZINACEF)  IV  1.5 g Intravenous Q24 Hr x 2  . diltiazem  180 mg Oral QHS  . famotidine (PEPCID) IV  20 mg Intravenous Q12H  . insulin aspart  0-24 Units Subcutaneous 6 times per day  . lipase/protease/amylase  48,000 Units Oral BID WC  . metoprolol tartrate  12.5 mg Oral BID  . multivitamin  1 tablet Oral QHS  . [START ON 02/18/2015]  pantoprazole  40 mg Oral Daily  . sevelamer carbonate  1,600 mg Oral TID WC  . sodium chloride  3 mL Intravenous Q12H  . warfarin  2.5 mg Oral q1800  . Warfarin - Physician Dosing Inpatient   Does not apply q1800   . diltiazem (CARDIZEM) infusion 5 mg/hr (02/17/15 0852)  . milrinone 0.2 mcg/kg/min (02/16/15 1818)  . norepinephrine (LEVOPHED) Adult infusion 3 mcg/min (02/17/15 0400)  . phenylephrine (NEO-SYNEPHRINE) Adult infusion 30 mcg/min (02/17/15 0810)   sodium chloride, sodium chloride, sodium chloride, albumin human, alteplase, heparin, lidocaine (PF), lidocaine-prilocaine, metoprolol, morphine injection, ondansetron (ZOFRAN) IV, oxyCODONE, pentafluoroprop-tetrafluoroeth, sodium chloride

## 2015-02-18 ENCOUNTER — Inpatient Hospital Stay (HOSPITAL_COMMUNITY): Payer: MEDICARE

## 2015-02-18 ENCOUNTER — Ambulatory Visit (HOSPITAL_COMMUNITY): Payer: MEDICARE

## 2015-02-18 DIAGNOSIS — I350 Nonrheumatic aortic (valve) stenosis: Secondary | ICD-10-CM

## 2015-02-18 DIAGNOSIS — N185 Chronic kidney disease, stage 5: Secondary | ICD-10-CM

## 2015-02-18 DIAGNOSIS — Z954 Presence of other heart-valve replacement: Secondary | ICD-10-CM

## 2015-02-18 LAB — POCT I-STAT 3, ART BLOOD GAS (G3+)
Acid-Base Excess: 3 mmol/L — ABNORMAL HIGH (ref 0.0–2.0)
BICARBONATE: 28 meq/L — AB (ref 20.0–24.0)
O2 Saturation: 94 %
PCO2 ART: 41.3 mmHg (ref 35.0–45.0)
PH ART: 7.439 (ref 7.350–7.450)
PO2 ART: 69 mmHg — AB (ref 80.0–100.0)
TCO2: 29 mmol/L (ref 0–100)

## 2015-02-18 LAB — GLUCOSE, CAPILLARY
GLUCOSE-CAPILLARY: 88 mg/dL (ref 65–99)
Glucose-Capillary: 108 mg/dL — ABNORMAL HIGH (ref 65–99)

## 2015-02-18 LAB — BASIC METABOLIC PANEL
ANION GAP: 10 (ref 5–15)
BUN: 24 mg/dL — ABNORMAL HIGH (ref 6–20)
CO2: 27 mmol/L (ref 22–32)
Calcium: 8.8 mg/dL — ABNORMAL LOW (ref 8.9–10.3)
Chloride: 97 mmol/L — ABNORMAL LOW (ref 101–111)
Creatinine, Ser: 3.79 mg/dL — ABNORMAL HIGH (ref 0.61–1.24)
GFR, EST AFRICAN AMERICAN: 17 mL/min — AB (ref >60)
GFR, EST NON AFRICAN AMERICAN: 14 mL/min — AB (ref >60)
GLUCOSE: 102 mg/dL — AB (ref 65–99)
POTASSIUM: 3.8 mmol/L (ref 3.5–5.1)
Sodium: 134 mmol/L — ABNORMAL LOW (ref 135–145)

## 2015-02-18 LAB — RENAL FUNCTION PANEL
ALBUMIN: 2.7 g/dL — AB (ref 3.5–5.0)
ANION GAP: 10 (ref 5–15)
BUN: 31 mg/dL — ABNORMAL HIGH (ref 6–20)
CHLORIDE: 97 mmol/L — AB (ref 101–111)
CO2: 26 mmol/L (ref 22–32)
Calcium: 9 mg/dL (ref 8.9–10.3)
Creatinine, Ser: 4.23 mg/dL — ABNORMAL HIGH (ref 0.61–1.24)
GFR calc Af Amer: 15 mL/min — ABNORMAL LOW (ref >60)
GFR, EST NON AFRICAN AMERICAN: 13 mL/min — AB (ref >60)
Glucose, Bld: 120 mg/dL — ABNORMAL HIGH (ref 65–99)
POTASSIUM: 4.3 mmol/L (ref 3.5–5.1)
Phosphorus: 4.8 mg/dL — ABNORMAL HIGH (ref 2.5–4.6)
Sodium: 133 mmol/L — ABNORMAL LOW (ref 135–145)

## 2015-02-18 LAB — POCT I-STAT, CHEM 8
BUN: 10 mg/dL (ref 6–20)
CALCIUM ION: 0.86 mmol/L — AB (ref 1.13–1.30)
CHLORIDE: 113 mmol/L — AB (ref 101–111)
CREATININE: 1.8 mg/dL — AB (ref 0.61–1.24)
GLUCOSE: 88 mg/dL (ref 65–99)
HCT: 17 % — ABNORMAL LOW (ref 39.0–52.0)
Hemoglobin: 5.8 g/dL — CL (ref 13.0–17.0)
Potassium: 2.4 mmol/L — CL (ref 3.5–5.1)
Sodium: 143 mmol/L (ref 135–145)
TCO2: 12 mmol/L (ref 0–100)

## 2015-02-18 LAB — PROTIME-INR
INR: 1.78 — AB (ref 0.00–1.49)
PROTHROMBIN TIME: 20.7 s — AB (ref 11.6–15.2)

## 2015-02-18 LAB — CBC
HEMATOCRIT: 28.7 % — AB (ref 39.0–52.0)
HEMOGLOBIN: 9.4 g/dL — AB (ref 13.0–17.0)
MCH: 34.7 pg — ABNORMAL HIGH (ref 26.0–34.0)
MCHC: 32.8 g/dL (ref 30.0–36.0)
MCV: 105.9 fL — ABNORMAL HIGH (ref 78.0–100.0)
Platelets: 101 10*3/uL — ABNORMAL LOW (ref 150–400)
RBC: 2.71 MIL/uL — AB (ref 4.22–5.81)
RDW: 15.2 % (ref 11.5–15.5)
WBC: 10.2 10*3/uL (ref 4.0–10.5)

## 2015-02-18 LAB — HEPATITIS B SURFACE ANTIGEN: Hepatitis B Surface Ag: NEGATIVE

## 2015-02-18 MED ORDER — SODIUM CHLORIDE 0.9 % FOR CRRT
INTRAVENOUS_CENTRAL | Status: DC | PRN
  Filled 2015-02-18: qty 1000

## 2015-02-18 MED ORDER — PRISMASOL BGK 4/2.5 32-4-2.5 MEQ/L IV SOLN
INTRAVENOUS | Status: DC
Start: 2015-02-18 — End: 2015-02-21
  Administered 2015-02-18 – 2015-02-21 (×6): via INTRAVENOUS_CENTRAL
  Filled 2015-02-18 (×7): qty 5000

## 2015-02-18 MED ORDER — NOREPINEPHRINE BITARTRATE 1 MG/ML IV SOLN
0.0000 ug/min | INTRAVENOUS | Status: DC
  Administered 2015-02-18: 12 ug/min via INTRAVENOUS
  Filled 2015-02-18 (×2): qty 16

## 2015-02-18 MED ORDER — HEPARIN SODIUM (PORCINE) 1000 UNIT/ML DIALYSIS: 1000.0000 [IU] | INTRAMUSCULAR | Status: DC | PRN

## 2015-02-18 MED ORDER — PRISMASOL BGK 4/2.5 32-4-2.5 MEQ/L IV SOLN
INTRAVENOUS | Status: DC
Start: 2015-02-18 — End: 2015-02-21
  Administered 2015-02-18 – 2015-02-21 (×19): via INTRAVENOUS_CENTRAL
  Filled 2015-02-18 (×31): qty 5000

## 2015-02-18 MED ORDER — MILRINONE IN DEXTROSE 20 MG/100ML IV SOLN
0.0000 ug/kg/min | INTRAVENOUS | Status: DC
  Administered 2015-02-18: 0.1 ug/kg/min via INTRAVENOUS
  Filled 2015-02-18: qty 100

## 2015-02-18 MED ORDER — ALTEPLASE 2 MG IJ SOLR: 2.0000 mg | Freq: Once | INTRAMUSCULAR | Status: DC | PRN

## 2015-02-18 MED ORDER — PRISMASOL BGK 4/2.5 32-4-2.5 MEQ/L IV SOLN
INTRAVENOUS | Status: DC
  Administered 2015-02-18 – 2015-02-20 (×4): via INTRAVENOUS_CENTRAL
  Filled 2015-02-18 (×4): qty 5000

## 2015-02-18 NOTE — Progress Notes (Addendum)
  [facility] Progress Note   Subjective: hypoxic overnight and now is on FM O2, CXR continues to look worse w edema today  Filed Vitals:   02/18/15 1015 02/18/15 1030 02/18/15 1045 02/18/15 1100  BP:    75/39  Pulse: 78 63 59 65  Temp:      TempSrc:      Resp: 23 18 16 15   Weight:      SpO2: 91% 94% 89% 85%   Exam: Alert, on FM O2 no jvd RRR, 3/6 hsm ,.no rub or gallop Chest bibasilar rales bs + , soft , NT, ND Ext 1+ edema bilat Neuro alert, Ox 3 LUA AVF +bruit  [facility] MWF   4h  77kg  3/2.0 bath  Hep 4000  LUA AVF Venofer 15mb q wed hd  Other op lab hgb 12.5 oct 14 , ca 9.2 phos 6.1 pth 213  Assessment/Plan 1. Severe AS =   SP TAVR 2. Hypoxemia/ pulm edema/ vol excess- getting worse  3. Hypotension on pressors (levo) + milrinone 4. Afib - IV dilt off now, HR 60's , on daily MTP and po dilt also. Coumadin started yesterday, may want to consider holding off on this. INR 1.78 today and rising 5. ESRD HD mwf 6. Anemia - Hb 9's now, not on esa yet 7. MBD on Renvela 8. Hx CAD/ CM  Plan - switch to CRRT , get vol down slowly   Kelly Splinter MD [place] pager [phone]    cell [phone] 02/18/2015, 11:32 AM    Recent Labs Lab 02/17/15 0353 02/17/15 1745 02/17/15 1802 02/18/15 0404  NA 136 143 134* 134*  K 4.8 2.4* 4.3 3.8  CL 102 113* 98* 97*  CO2 17*  --   --  27  GLUCOSE 139* 88 135* 102*  BUN 39* 10 19 24*  CREATININE 4.67* 1.80* 3.40* 3.79*  CALCIUM 8.9  --   --  8.8*   No results for input(s): AST, ALT, ALKPHOS, BILITOT, PROT, ALBUMIN in the last 168 hours.  Recent Labs Lab 02/17/15 0353  02/17/15 1749 02/17/15 1802 02/18/15 0404  WBC 18.3*  --  15.2*  --  10.2  HGB 10.8*  < > 10.2* 10.9* 9.4*  HCT 33.4*  < > 31.7* 32.0* 28.7*  MCV 108.4*  --  108.6*  --  105.9*  PLT 138*  --  127*  --  101*  < > = values in this interval not displayed. [name] acetaminophen  1,000 mg Oral 4 times per day  . antiseptic oral  rinse  7 mL Mouth Rinse BID  . aspirin EC  325 mg Oral Daily  . calcitRIOL  0.25 mcg Oral Daily  . diltiazem  180 mg Oral QHS  . lipase/protease/amylase  48,000 Units Oral BID WC  . metoprolol tartrate  12.5 mg Oral BID  . multivitamin  1 tablet Oral QHS  . pantoprazole  40 mg Oral Daily  . sevelamer carbonate  1,600 mg Oral TID WC  . sodium chloride  3 mL Intravenous Q12H  . warfarin  2.5 mg Oral q1800  . Warfarin - Physician Dosing Inpatient   Does not apply q1800   . diltiazem (CARDIZEM) infusion Stopped (02/18/15 1005)  . milrinone 0.1 mcg/kg/min (02/18/15 1000)  . norepinephrine (LEVOPHED) Adult infusion 12 mcg/min (02/18/15 1123)   sodium chloride, sodium chloride, sodium chloride, alteplase, fentaNYL (SUBLIMAZE) injection, heparin, hydrOXYzine, lidocaine (PF), lidocaine-prilocaine, metoprolol, ondansetron (ZOFRAN) IV, pentafluoroprop-tetrafluoroeth, sodium chloride

## 2015-02-18 NOTE — Significant Event (Addendum)
Placed patient on NRB for desaturations to mid 85% on venti mask @ 55%. Patient remained neuro intact at this time. Having to increase pressor from 11mcg.

## 2015-02-18 NOTE — Progress Notes (Addendum)
[address]       [place] [address]             [phone]        CARDIOTHORACIC SURGERY PROGRESS NOTE   R2 Days Post-Op Procedure(s) (LRB): TRANSCATHETER AORTIC VALVE REPLACEMENT, TRANSAPICAL (N/A) TRANSESOPHAGEAL ECHOCARDIOGRAM (TEE) (N/A)  Subjective: Some mild confusion and hypoxemia overnight.  Currently looks good and denies SOB.  Mild soreness in chest.  Looking for breakfast  Objective: Vital signs: BP Readings from Last 1 Encounters:  02/18/15 90/57   Pulse Readings from Last 1 Encounters:  02/18/15 106   Resp Readings from Last 1 Encounters:  02/18/15 23   Temp Readings from Last 1 Encounters:  02/18/15 99.2 F (37.3 C) Oral    Hemodynamics: PAP: (57-83)/(31-47) 62/31 mmHg CO:  [4.3 L/min] 4.3 L/min CI:  [2.3 L/min/m2] 2.3 L/min/m2  Physical Exam:  Rhythm:   Afib w/ controlled rate  Breath sounds: Few crackles but overall fairly clear  Heart sounds:  irregular  Incisions:  Clean and dry  Abdomen:  Soft, non-distended, non-tender  Extremities:  Warm, well-perfused  Chest tubes:  Low volume thin serosanguinous output, no air leak    Intake/Output from previous day: 10/19 0701 - 10/20 0700 In: 1614.9 [P.O.:480; I.V.:1134.9] Out: 2240 [Chest Tube:540] Intake/Output this shift: Total I/O In: 25.7 [I.V.:25.7] Out: -   Lab Results:  CBC: Recent Labs  02/17/15 1749 02/17/15 1802 02/18/15 0404  WBC 15.2*  --  10.2  HGB 10.2* 10.9* 9.4*  HCT 31.7* 32.0* 28.7*  PLT 127*  --  101*    BMET:  Recent Labs  02/17/15 0353  02/17/15 1802 02/18/15 0404  NA 136  < > 134* 134*  K 4.8  < > 4.3 3.8  CL 102  < > 98* 97*  CO2 17*  --   --  27  GLUCOSE 139*  < > 135* 102*  BUN 39*  < > 19 24*  CREATININE 4.67*  < > 3.40* 3.79*  CALCIUM 8.9  --   --  8.8*  < > = values in this interval not displayed.   PT/INR:   Recent Labs  02/18/15 0404  LABPROT 20.7*  INR 1.78*    CBG (last 3)   Recent Labs  02/17/15 2327  02/18/15 0319 02/18/15 0805  GLUCAP 123* 88 108*    ABG    Component Value Date/Time   PHART 7.397 02/17/2015 2204   PCO2ART 36.6 02/17/2015 2204   PO2ART 49.0* 02/17/2015 2204   HCO3 22.5 02/17/2015 2204   TCO2 24 02/17/2015 2204   ACIDBASEDEF 2.0 02/17/2015 2204   O2SAT 84.0 02/17/2015 2204    CXR: Stable mild-moderate CHF and bibasilar atelectasis  Assessment/Plan: S/P Procedure(s) (LRB): TRANSCATHETER AORTIC VALVE REPLACEMENT, TRANSAPICAL (N/A) TRANSESOPHAGEAL ECHOCARDIOGRAM (TEE) (N/A)  Overall stable POD2 Chronic persistent Afib - currently w/ good rate control on diltiazem drip Stable hemodynamics off Neo drip but still on low dose levophed and milrinone Acute on chronic combined systolic and diastolic CHF - stable although some hypoxemia overnight Expected post op volume excess, weight still 7-8 kg above baseline 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 Chronic anemia - stable Post op thrombocytopenia, expected, mild Right pleural effusion, small, stable   Continue low dose lopressor and diltiazem for rate control - wean drip as tolerated for HR  Wean levophed as tolerated   Wean milrinone slowly  HD per Nephrology - probably still need to go very slow due to BP -  consider CVVHD  Continue coumadin  D/C chest tube  Mobilize  PT consult  Keep in SICU until BP stable off drips   I spent in excess of 15 minutes during the conduct of this hospital encounter and >50% of this time involved direct face-to-face encounter with the patient for counseling and/or coordination of their care.    [name], MD 02/18/2015 8:20 AM

## 2015-02-18 NOTE — Progress Notes (Signed)
Echocardiogram 2D Echocardiogram has been performed.  [name] 02/18/2015, 11:26 AM

## 2015-02-18 NOTE — Significant Event (Addendum)
HD catheter placed successfully. Patient to be started on CRRT once all supplies are at bedside. Report given to receiving RN Elmyra Ricks who will take over care of patient. Patient's has order to remove chest tube, however, chest tube output is 400cc so far in less than 6 hours-will follow-up with Dr. Roxy Manns regarding this after his OR case-Nicole made aware of this.

## 2015-02-18 NOTE — Addendum Note (Signed)
Addendum  created 02/18/15 1338 by Roberts Gaudy, MD   Modules edited: Notes Section   Notes Section:  File: [id number]; Pend: [id number]; Raelyn Number: [id number]; Raelyn Number: [id number]; Pend: [id number]

## 2015-02-18 NOTE — Progress Notes (Signed)
Patient ID: [name], male   DOB: [birth year], 74 y.o.   MRN: [id number]  SICU Evening Rounds:  Hemodynamically stable on Milrinone 0.2. Levophed is off  Rate controlled atrial fib. Cardizem is off.   HD catheter inserted

## 2015-02-18 NOTE — Progress Notes (Signed)
Anesthesiology Follow-up:  He appears mildly confused, moving all extremities without difficulty, persistent atrial fib with good rate control diltiazem now off. Requiring levophed 14 mcg/min and milrinone 0.1 mcg/kg/min for hemodynamic support, he appears fluid overloaded on CXR, hemodialysis catheter inserted today, for CVVH.  VS; T- 36.6 BP-99/37 HR-65 RR 24 O2 Sat 88% on RA  K-3.8 Na-134 glucose 123 BUN/Cr 24/3.9 H/H 9.4/28.7 platelets 101,000   Extubated 4 hours following TAVR. Stable at present, he should benefit from CVVH.  Roberts Gaudy

## 2015-02-18 NOTE — Procedures (Signed)
Central Venous Catheter Insertion Procedure Note Kourtney Montesinos [id number] 1940/09/26  Procedure: Insertion of Hemodialysis Catheter Indications: Acute renal failure  Procedure Details Consent: Risks of procedure as well as the alternatives and risks of each were explained to the (patient/caregiver).  Consent for procedure obtained. Time Out: Verified patient identification, verified procedure, site/side was marked, verified correct patient position, special equipment/implants available, medications/allergies/relevent history reviewed, required imaging and test results available.  Performed  Maximum sterile technique was used including antiseptics, cap, gloves, gown, hand hygiene, mask and sheet. Skin prep: Chlorhexidine; local anesthetic administered A hemodialysis catheter was placed in the left internal jugular vein using the Seldinger technique.  Evaluation Blood flow good Complications: No apparent complications Patient did tolerate procedure well. Chest X-ray ordered to verify placement.  CXR: pending.  Performed by Georgann Housekeeper, ACNP with u/s guidance.  Chesley Mires, MD [facility] Pulmonary/Critical Care 02/18/2015, 12:55 PM Pager:  [phone] After 3pm call: [phone]

## 2015-02-19 ENCOUNTER — Inpatient Hospital Stay (HOSPITAL_COMMUNITY): Payer: MEDICARE

## 2015-02-19 ENCOUNTER — Encounter (HOSPITAL_COMMUNITY): Payer: Self-pay | Admitting: *Deleted

## 2015-02-19 DIAGNOSIS — J90 Pleural effusion, not elsewhere classified: Secondary | ICD-10-CM

## 2015-02-19 DIAGNOSIS — I350 Nonrheumatic aortic (valve) stenosis: Secondary | ICD-10-CM

## 2015-02-19 LAB — CBC
HEMATOCRIT: 29.3 % — AB (ref 39.0–52.0)
HEMOGLOBIN: 9.4 g/dL — AB (ref 13.0–17.0)
MCH: 34.2 pg — AB (ref 26.0–34.0)
MCHC: 32.1 g/dL (ref 30.0–36.0)
MCV: 106.5 fL — AB (ref 78.0–100.0)
Platelets: 69 10*3/uL — ABNORMAL LOW (ref 150–400)
RBC: 2.75 MIL/uL — AB (ref 4.22–5.81)
RDW: 15.3 % (ref 11.5–15.5)
WBC: 8.5 10*3/uL (ref 4.0–10.5)

## 2015-02-19 LAB — RENAL FUNCTION PANEL
ALBUMIN: 2.5 g/dL — AB (ref 3.5–5.0)
ANION GAP: 8 (ref 5–15)
Albumin: 2.8 g/dL — ABNORMAL LOW (ref 3.5–5.0)
Anion gap: 7 (ref 5–15)
BUN: 23 mg/dL — ABNORMAL HIGH (ref 6–20)
BUN: 21 mg/dL — AB (ref 6–20)
CHLORIDE: 95 mmol/L — AB (ref 101–111)
CHLORIDE: 101 mmol/L (ref 101–111)
CO2: 27 mmol/L (ref 22–32)
CO2: 27 mmol/L (ref 22–32)
Calcium: 8.6 mg/dL — ABNORMAL LOW (ref 8.9–10.3)
Calcium: 8.5 mg/dL — ABNORMAL LOW (ref 8.9–10.3)
Creatinine, Ser: 3.22 mg/dL — ABNORMAL HIGH (ref 0.61–1.24)
Creatinine, Ser: 2.77 mg/dL — ABNORMAL HIGH (ref 0.61–1.24)
GFR calc non Af Amer: 18 mL/min — ABNORMAL LOW (ref >60)
GFR, EST AFRICAN AMERICAN: 20 mL/min — AB (ref >60)
GFR, EST AFRICAN AMERICAN: 24 mL/min — AB (ref >60)
GFR, EST NON AFRICAN AMERICAN: 21 mL/min — AB (ref >60)
GLUCOSE: 116 mg/dL — AB (ref 65–99)
GLUCOSE: 137 mg/dL — AB (ref 65–99)
Phosphorus: 3.3 mg/dL (ref 2.5–4.6)
Phosphorus: 2.5 mg/dL (ref 2.5–4.6)
Potassium: 4.3 mmol/L (ref 3.5–5.1)
Potassium: 4.6 mmol/L (ref 3.5–5.1)
SODIUM: 135 mmol/L (ref 135–145)
Sodium: 130 mmol/L — ABNORMAL LOW (ref 135–145)

## 2015-02-19 LAB — MAGNESIUM: Magnesium: 2.1 mg/dL (ref 1.7–2.4)

## 2015-02-19 LAB — APTT: APTT: 33 s (ref 24–37)

## 2015-02-19 LAB — PROTIME-INR
INR: 1.47 (ref 0.00–1.49)
Prothrombin Time: 17.9 seconds — ABNORMAL HIGH (ref 11.6–15.2)

## 2015-02-19 MED ORDER — LIDOCAINE HCL (PF) 1 % IJ SOLN
30.0000 mL | Freq: Once | INTRAMUSCULAR | Status: AC
  Administered 2015-02-19: 30 mL

## 2015-02-19 MED ORDER — INFLUENZA VAC SPLIT QUAD 0.5 ML IM SUSY
0.5000 mL | PREFILLED_SYRINGE | INTRAMUSCULAR | Status: DC
  Filled 2015-02-19: qty 0.5

## 2015-02-19 MED ORDER — SODIUM CHLORIDE 0.9 % IJ SOLN
10.0000 mL | Freq: Two times a day (BID) | INTRAMUSCULAR | Status: DC
  Administered 2015-02-19 – 2015-02-22 (×6): 10 mL

## 2015-02-19 MED ORDER — FENTANYL CITRATE (PF) 100 MCG/2ML IJ SOLN
50.0000 ug | Freq: Once | INTRAMUSCULAR | Status: AC
  Administered 2015-02-19: 50 ug via INTRAVENOUS

## 2015-02-19 MED ORDER — OXYCODONE HCL 5 MG PO TABS
10.0000 mg | ORAL_TABLET | ORAL | Status: DC | PRN
  Administered 2015-02-19 (×2): 10 mg via ORAL
  Administered 2015-02-20 (×3): 5 mg via ORAL
  Administered 2015-02-20 – 2015-02-21 (×3): 10 mg via ORAL
  Administered 2015-02-22: 5 mg via ORAL
  Administered 2015-02-22 – 2015-02-23 (×3): 10 mg via ORAL
  Filled 2015-02-19 (×11): qty 2

## 2015-02-19 MED ORDER — SODIUM CHLORIDE 0.9 % IJ SOLN: 10.0000 mL | INTRAMUSCULAR | Status: DC | PRN

## 2015-02-19 NOTE — Progress Notes (Signed)
Pt unwilling to keep Lealman >30 and  keeps removing oxygen; risks and benefits discussed with patient and patients daughter at bedside; pt verbalizes understanding.

## 2015-02-19 NOTE — Progress Notes (Addendum)
[address]       [place] [address]             [phone]        CARDIOTHORACIC SURGERY PROGRESS NOTE   R3 Days Post-Op Procedure(s) (LRB): TRANSCATHETER AORTIC VALVE REPLACEMENT, TRANSAPICAL (N/A) TRANSESOPHAGEAL ECHOCARDIOGRAM (TEE) (N/A)  Subjective: Feels better.  Mild soreness left chest w/ coughing.  Some mild intermittent confusion reported but seems fine presently.  O2 sats drop quickly when facemask off.  Objective: Vital signs: BP Readings from Last 1 Encounters:  02/19/15 127/81   Pulse Readings from Last 1 Encounters:  02/19/15 60   Resp Readings from Last 1 Encounters:  02/19/15 16   Temp Readings from Last 1 Encounters:  02/19/15 97.8 F (36.6 C) Oral    Hemodynamics:    Physical Exam:  Rhythm:   Afib w/ controlled rate  Breath sounds: Diminished at bases  Heart sounds:  irregular  Incisions:  Clean and dry  Abdomen:  Soft, non-distended, non-tender  Extremities:  Warm, well-perfused  Chest tube:  Low volume thin serous output but still draining, no air leak    Intake/Output from previous day: 10/20 0701 - 10/21 0700 In: 820.5 [P.O.:360; I.V.:460.5] Out: 2707 [Urine:225; Chest Tube:500] Intake/Output this shift: Total I/O In: 30 [I.V.:30] Out: 402 [Other:272; Chest Tube:130]  Lab Results:  CBC: Recent Labs  02/18/15 0404 02/19/15 0345  WBC 10.2 8.5  HGB 9.4* 9.4*  HCT 28.7* 29.3*  PLT 101* 69*    BMET:  Recent Labs  02/18/15 1700 02/19/15 0345  NA 133* 130*  K 4.3 4.3  CL 97* 95*  CO2 26 27  GLUCOSE 120* 116*  BUN 31* 23*  CREATININE 4.23* 3.22*  CALCIUM 9.0 8.6*     PT/INR:   Recent Labs  02/19/15 0345  LABPROT 17.9*  INR 1.47    CBG (last 3)   Recent Labs  02/17/15 2327 02/18/15 0319 02/18/15 0805  GLUCAP 123* 88 108*    ABG    Component Value Date/Time   PHART 7.439 02/18/2015 1948   PCO2ART 41.3 02/18/2015 1948   PO2ART 69.0* 02/18/2015 1948   HCO3 28.0* 02/18/2015  1948   TCO2 29 02/18/2015 1948   ACIDBASEDEF 2.0 02/17/2015 2204   O2SAT 94.0 02/18/2015 1948    CXR: PORTABLE CHEST 1 VIEW  COMPARISON: 02/18/2015.  FINDINGS: Right IJ line, left IJ line in stable position. Prior cardiac valve replacement. Cardiomegaly with diffuse bilateral pulmonary interstitial infiltrates and bilateral pleural effusions. No interim change. Findings consistent with congestive heart failure .  IMPRESSION: 1. Bilateral IJ lines in stable position. 2. Prior cardiac valve replacement. Persistent cardiomegaly pulmonary venous congestion with persistent bilateral prominent pulmonary interstitium and bilateral pleural effusions consistent with persistent congestive heart failure. No interim change from prior exam .   Electronically Signed  By: Marcello Moores Register  On: 02/19/2015 08:01   Assessment/Plan: S/P Procedure(s) (LRB): TRANSCATHETER AORTIC VALVE REPLACEMENT, TRANSAPICAL (N/A) TRANSESOPHAGEAL ECHOCARDIOGRAM (TEE) (N/A)  Overall stable POD3 Chronic persistent Afib - currently w/ good rate control on oral diltiazem and low dose metoprolol Stable BP off Neo and levophed, still on low dose milrinone Acute on chronic combined systolic and diastolic CHF - stable although still hypoxic Expected post op volume excess, tolerating CVVHD pulling 100 mL/hr and weight down slightly but still 7 kg above baseline CKD stage V on CVVHD - BUN/creatinine falling nicely Chronic anemia - stable Post op thrombocytopenia, expected, platelet count down to 69k today  Right pleural effusion, moderate, stable - might benefit from drainage   Continue low dose lopressor and diltiazem for rate control, can titrate metoprolol up if necessary  Wean milrinone slowly  CVVHD per Nephrology   Continue coumadin  Watch platelet count and hold ASA for now  Leave left chest tube in place as long as it keeps draining  Place right chest tube for pleural effusion associated  w/ hypoxemia  D/C Aline  Mobilize as much as possible  Keep in SICU   I spent in excess of 15 minutes during the conduct of this hospital encounter and >50% of this time involved direct face-to-face encounter with the patient for counseling and/or coordination of their care.   [name], MD 02/19/2015 9:25 AM

## 2015-02-19 NOTE — Op Note (Signed)
CARDIOTHORACIC SURGERY OPERATIVE NOTE  Date of Procedure:  02/19/2015  Preoperative Diagnosis: Right Pleural Effusion  Postoperative Diagnosis: Same  Procedure:   Right chest tube placement  Surgeon:   Valentina Gu. Roxy Manns, MD  Anesthesia: 1% lidocaine local with intravenous sedation    DETAILS OF THE OPERATIVE PROCEDURE  Following full informed consent the patient was given fentanyl 50 mg intravenously and continuously monitored for rhythm, BP and oxygen saturation. The right chest was prepared and draped in a sterile manner. 1% lidocaine was utilized to anesthetize the skin and subcutaneous tissues. A small incision was made and a 28 French straight chest tube was placed through the incision into the pleural space. The tube was secured to the skin and connected to a closed suction collection device. The patient tolerated the procedure well. A portable CXR was ordered. There were no complications.    Valentina Gu. Roxy Manns, MD 02/19/2015 10:31 AM

## 2015-02-19 NOTE — Progress Notes (Signed)
PT Cancellation Note  Patient Details Name: [name] MRN: [id number] DOB: 05/17/1940   Cancelled Treatment:    Reason Eval/Treat Not Completed: Patient not medically ready .  Pt still on CRRT and no mobility orders yet. 02/19/2015  Donnella Sham, [place] [phone]  (pager)  [name], Tessie Fass 02/19/2015, 10:05 AM

## 2015-02-19 NOTE — Progress Notes (Signed)
Patient has been extremely impulsive throughout the night, patient has been trying to pull out his lines (chest tube, CRRT catheter, and NRB mask). When providing the education/reasons/information about these lines/devices, patient states he understands why it is important to not pull at lines, but continues to do so.  Armboard placed around Rt. Brachial.  Patient was instructed that safety is a priority and that all interventions are done to keep him safe.   CRRT lines are secured to various parts of the bed to help keep them anchored incase patient tries to pull the lines again.

## 2015-02-19 NOTE — Progress Notes (Addendum)
    Subjective:  No new complaints this morning. Expected left chest incisional discomfort. Continued confusion noted overnight.   Objective:  Vital Signs in the last 24 hours: Temp:  [97.7 F (36.5 C)-98.7 F (37.1 C)] 98.4 F (36.9 C) (10/21 0345) Pulse Rate:  [56-122] 91 (10/21 0700) Resp:  [12-30] 15 (10/21 0700) BP: (75-152)/(39-107) 127/63 mmHg (10/21 0700) SpO2:  [71 %-100 %] 100 % (10/21 0700) Arterial Line BP: (73-149)/(33-125) 111/41 mmHg (10/21 0700) FiO2 (%):  [55 %-100 %] 100 % (10/21 0300) Weight:  [179 lb 0.2 oz (81.2 kg)] 179 lb 0.2 oz (81.2 kg) (10/21 0745)  Intake/Output from previous day: 10/20 0701 - 10/21 0700 In: 785.5 [P.O.:360; I.V.:425.5] Out: 2707 [Urine:225; Chest Tube:500]  Physical Exam: Pt is awake and alert, NAD on O2 facemask, answers simple questions appropriately HEENT: normal Neck: JVP - normal Lungs: diminished in the bases bilaterally CV: irregularly irregular with 2/6 SEM at the RUSB, no diastolic murmur Abd: soft, NT, Positive BS Ext: no C/C/E Skin: warm/dry no rash   Lab Results:  Recent Labs  02/18/15 0404 02/19/15 0345  WBC 10.2 8.5  HGB 9.4* 9.4*  PLT 101* 69*    Recent Labs  02/18/15 1700 02/19/15 0345  NA 133* 130*  K 4.3 4.3  CL 97* 95*  CO2 26 27  GLUCOSE 120* 116*  BUN 31* 23*  CREATININE 4.23* 3.22*   No results for input(s): TROPONINI in the last 72 hours.  Invalid input(s): CK, MB  Cardiac Studies: Post-op Echo: Study Conclusions  - Left ventricle: The cavity size was normal. Wall thickness was increased in a pattern of moderate LVH. Systolic function was normal. The estimated ejection fraction was in the range of 55% to 60%. - Aortic valve: S/P 29 mm Sapien XT valve insertion. Intraop TEE with moderate peri valvular regurgitation. F/U now only appears mild Although best view in apical 5 chamber tech cut off regurgitant color flow All other views AR mild and improved from OR. -  Mitral valve: Calcified annulus. There was mild regurgitation. - Left atrium: The atrium was severely dilated. - Right ventricle: The cavity size was mildly dilated. - Right atrium: The atrium was mildly dilated. - Atrial septum: No defect or patent foramen ovale was identified.  Tele: Atrial fibrillation   Assessment/Plan:  1. Severe AS s/p TAVR, POD #3 - echo reviewed and valve function looks good with only mild paravalvular AI 2. Acute respiratory failure: component ov interstitial edema, pleural effusion. CXR reviewed this am. Pt requiring O2 per facemask to maintain adequate oxygen saturation. Continue CVVH for fluid removal. Consider drainage of right pleural effusion - will review with Dr Roxy Manns. 3. Chronic atrial fibrillation: pt on warfarin. INR 1.47 today, rate controlled on oral diltiazem and metoprolol at low dose 4. Delirium 5. ESRD - currently on CVVHD 6. Acute on chronic diastolic heart failure: on milrinone at present in setting volume overload. Fluid removal by CVVHD.  [name], M.D. 02/19/2015, 7:49 AM

## 2015-02-19 NOTE — Progress Notes (Signed)
  [facility] Progress Note   Subjective: looks better, R chest tube placed and I/O neg 1900 yesterday. On CRRT, one clotting episode last 18 hrs  Filed Vitals:   02/19/15 0700 02/19/15 0745 02/19/15 0800 02/19/15 0900  BP: 127/63  116/58 127/81  Pulse: 91  60   Temp:   97.8 F (36.6 C)   TempSrc:   Oral   Resp: 15  17 16   Weight:  81.2 kg (179 lb 0.2 oz)    SpO2: 100%  100% 98%   Exam: Alert, on FM O2 no jvd RRR, 3/6 hsm ,.no rub or gallop Chest bibasilar rales bs + , soft , NT, ND Ext 1+ edema bilat Neuro alert, Ox 3 LUA AVF +bruit  [facility] MWF   4h  77kg  3/2.0 bath  Hep 4000  LUA AVF Venofer 51mb q wed hd  Other op lab hgb 12.5 oct 14 , ca 9.2 phos 6.1 pth 213  Assessment/Plan 1. Severe AS =   SP TAVR 2. Hypoxemia/ pulm edema/ vol excess- starting to stabilize  3. Hypotension - much better, off pressors now, milrinone almost off 4. Afib - on po dilt and MTP 5. ESRD on CRRT now D #2 6. 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 Hx CAD/ CM  Plan - ^UF slightly to 75- 125 cc/hr, consider citrate if clots again   [name] Splinter MD [facility] pager [phone]    cell [phone] 02/19/2015, 11:56 AM    Recent Labs Lab 02/18/15 0404 02/18/15 1700 02/19/15 0345  NA 134* 133* 130*  K 3.8 4.3 4.3  CL 97* 97* 95*  CO2 27 26 27   GLUCOSE 102* 120* 116*  BUN 24* 31* 23*  CREATININE 3.79* 4.23* 3.22*  CALCIUM 8.8* 9.0 8.6*  PHOS  --  4.8* 3.3    Recent Labs Lab 02/18/15 1700 02/19/15 0345  ALBUMIN 2.7* 2.8*    Recent Labs Lab 02/17/15 1749 02/17/15 1802 02/18/15 0404 02/19/15 0345  WBC 15.2*  --  10.2 8.5  HGB 10.2* 10.9* 9.4* 9.4*  HCT 31.7* 32.0* 28.7* 29.3*  MCV 108.6*  --  105.9* 106.5*  PLT 127*  --  101* 69*   . acetaminophen  1,000 mg Oral 4 times per day  . antiseptic oral rinse  7 mL Mouth Rinse BID  . calcitRIOL  0.25 mcg Oral Daily  . diltiazem  180 mg Oral QHS  . lidocaine (PF)  30 mL  Infiltration Once  . lipase/protease/amylase  48,000 Units Oral BID WC  . metoprolol tartrate  12.5 mg Oral BID  . multivitamin  1 tablet Oral QHS  . pantoprazole  40 mg Oral Daily  . sevelamer carbonate  1,600 mg Oral TID WC  . sodium chloride  3 mL Intravenous Q12H  . warfarin  2.5 mg Oral q1800  . Warfarin - Physician Dosing Inpatient   Does not apply q1800   . diltiazem (CARDIZEM) infusion Stopped (02/18/15 1005)  . milrinone 0.2 mcg/kg/min (02/19/15 0900)  . dialysis replacement fluid (prismasate) 400 mL/hr at 02/19/15 0537  . dialysis replacement fluid (prismasate) 200 mL/hr at 02/18/15 1605  . dialysate (PRISMASATE) 1,800 mL/hr at 02/19/15 1009   sodium chloride, sodium chloride, sodium chloride, alteplase, fentaNYL (SUBLIMAZE) injection, heparin, heparin, hydrOXYzine, lidocaine (PF), lidocaine-prilocaine, metoprolol, ondansetron (ZOFRAN) IV, pentafluoroprop-tetrafluoroeth, sodium chloride, sodium chloride

## 2015-02-19 NOTE — Significant Event (Signed)
Patients SBP 81 with MAP 51; decreased CRRT fluid removal to 0 and notified Dr. Jonnie Finner.  Instructed to begin fluid removal at 79ml/hr when SBP returns to normal.

## 2015-02-19 NOTE — Care Management Important Message (Signed)
Important Message  Patient Details  Name: [name] MRN: [id number] Date of Birth: Aug 28, 1940   Medicare Important Message Given:  Yes-second notification given    Nathen May 02/19/2015, 11:53 AM

## 2015-02-19 NOTE — Progress Notes (Signed)
Several attempts made last night to wean patient off NRB to Venti Mask or Nasal Cannula.  Patient's sats dropped and remained in the 70 on all trials of nasal cannula and Venti Mask.  Cards MD aware this AM.    Patient is still confused/delirious, can be reoriented.

## 2015-02-19 NOTE — Progress Notes (Signed)
Patient ID: Trindon Dorton, male   DOB: 1940/07/05, 74 y.o.   MRN: [id number] EVENING ROUNDS NOTE :     [address]       [place] [address]             [phone]                 3 Days Post-Op Procedure(s) (LRB): TRANSCATHETER AORTIC VALVE REPLACEMENT, TRANSAPICAL (N/A) TRANSESOPHAGEAL ECHOCARDIOGRAM (TEE) (N/A)  Total Length of Stay:  LOS: 3 days  BP 97/53 mmHg  Pulse 63  Temp(Src) 97.7 F (36.5 C) (Oral)  Resp 14  Wt 179 lb 0.2 oz (81.2 kg)  SpO2 98%  .Intake/Output      10/21 0701 - 10/22 0700   P.O. 240   I.V. (mL/kg) 159.3 (2)   Total Intake(mL/kg) 399.3 (4.9)   Urine (mL/kg/hr)    Other 1023 (0.9)   Chest Tube 1950 (1.7)   Total Output 2973   Net -2573.8         . diltiazem (CARDIZEM) infusion Stopped (02/18/15 1005)  . dialysis replacement fluid (prismasate) 400 mL/hr at 02/19/15 1941  . dialysis replacement fluid (prismasate) 200 mL/hr at 02/19/15 1942  . dialysate (PRISMASATE) 1,800 mL/hr at 02/19/15 1942     Lab Results  Component Value Date   WBC 8.5 02/19/2015   HGB 9.4* 02/19/2015   HCT 29.3* 02/19/2015   PLT 69* 02/19/2015   GLUCOSE 137* 02/19/2015   ALT 25 02/11/2015   AST 33 02/11/2015   NA 135 02/19/2015   K 4.6 02/19/2015   CL 101 02/19/2015   CREATININE 2.77* 02/19/2015   BUN 21* 02/19/2015   CO2 27 02/19/2015   TSH 2.46 04/28/2014   INR 1.47 02/19/2015   HGBA1C 5.9* 02/11/2015   On cvvh but not much volume out , breathing better after chest tube out  Grace Isaac MD  Beeper [phone] Office [phone] 02/19/2015 9:26 PM

## 2015-02-20 ENCOUNTER — Inpatient Hospital Stay (HOSPITAL_COMMUNITY): Payer: MEDICARE

## 2015-02-20 DIAGNOSIS — I482 Chronic atrial fibrillation: Secondary | ICD-10-CM

## 2015-02-20 LAB — COMPREHENSIVE METABOLIC PANEL
ALBUMIN: 2.6 g/dL — AB (ref 3.5–5.0)
ALT: 245 U/L — ABNORMAL HIGH (ref 17–63)
ANION GAP: 5 (ref 5–15)
AST: 133 U/L — ABNORMAL HIGH (ref 15–41)
Alkaline Phosphatase: 60 U/L (ref 38–126)
BUN: 17 mg/dL (ref 6–20)
CHLORIDE: 98 mmol/L — AB (ref 101–111)
CO2: 28 mmol/L (ref 22–32)
Calcium: 8.1 mg/dL — ABNORMAL LOW (ref 8.9–10.3)
Creatinine, Ser: 2.37 mg/dL — ABNORMAL HIGH (ref 0.61–1.24)
GFR calc Af Amer: 29 mL/min — ABNORMAL LOW (ref >60)
GFR calc non Af Amer: 25 mL/min — ABNORMAL LOW (ref >60)
GLUCOSE: 122 mg/dL — AB (ref 65–99)
POTASSIUM: 4.2 mmol/L (ref 3.5–5.1)
SODIUM: 131 mmol/L — AB (ref 135–145)
TOTAL PROTEIN: 5 g/dL — AB (ref 6.5–8.1)
Total Bilirubin: 0.8 mg/dL (ref 0.3–1.2)

## 2015-02-20 LAB — CBC
HEMATOCRIT: 30.2 % — AB (ref 39.0–52.0)
HEMOGLOBIN: 9.8 g/dL — AB (ref 13.0–17.0)
MCH: 35.4 pg — AB (ref 26.0–34.0)
MCHC: 32.5 g/dL (ref 30.0–36.0)
MCV: 109 fL — ABNORMAL HIGH (ref 78.0–100.0)
Platelets: 60 10*3/uL — ABNORMAL LOW (ref 150–400)
RBC: 2.77 MIL/uL — ABNORMAL LOW (ref 4.22–5.81)
RDW: 15.5 % (ref 11.5–15.5)
WBC: 8.2 10*3/uL (ref 4.0–10.5)

## 2015-02-20 LAB — PROTIME-INR
INR: 1.48 (ref 0.00–1.49)
Prothrombin Time: 18 seconds — ABNORMAL HIGH (ref 11.6–15.2)

## 2015-02-20 LAB — POCT I-STAT 3, ART BLOOD GAS (G3+)
Acid-Base Excess: 3 mmol/L — ABNORMAL HIGH (ref 0.0–2.0)
BICARBONATE: 26.2 meq/L — AB (ref 20.0–24.0)
O2 Saturation: 94 %
TCO2: 27 mmol/L (ref 0–100)
pCO2 arterial: 32 mmHg — ABNORMAL LOW (ref 35.0–45.0)
pH, Arterial: 7.52 — ABNORMAL HIGH (ref 7.350–7.450)
pO2, Arterial: 62 mmHg — ABNORMAL LOW (ref 80.0–100.0)

## 2015-02-20 LAB — APTT: APTT: 36 s (ref 24–37)

## 2015-02-20 LAB — RENAL FUNCTION PANEL
ANION GAP: 4 — AB (ref 5–15)
Albumin: 2.5 g/dL — ABNORMAL LOW (ref 3.5–5.0)
BUN: 17 mg/dL (ref 6–20)
CALCIUM: 8.2 mg/dL — AB (ref 8.9–10.3)
CO2: 28 mmol/L (ref 22–32)
Chloride: 101 mmol/L (ref 101–111)
Creatinine, Ser: 2.32 mg/dL — ABNORMAL HIGH (ref 0.61–1.24)
GFR calc non Af Amer: 26 mL/min — ABNORMAL LOW (ref >60)
GFR, EST AFRICAN AMERICAN: 30 mL/min — AB (ref >60)
Glucose, Bld: 105 mg/dL — ABNORMAL HIGH (ref 65–99)
PHOSPHORUS: 2.3 mg/dL — AB (ref 2.5–4.6)
Potassium: 5 mmol/L (ref 3.5–5.1)
SODIUM: 133 mmol/L — AB (ref 135–145)

## 2015-02-20 LAB — PHOSPHORUS: Phosphorus: 2.3 mg/dL — ABNORMAL LOW (ref 2.5–4.6)

## 2015-02-20 LAB — MAGNESIUM: MAGNESIUM: 2.3 mg/dL (ref 1.7–2.4)

## 2015-02-20 MED ORDER — NOREPINEPHRINE BITARTRATE 1 MG/ML IV SOLN
0.0000 ug/min | INTRAVENOUS | Status: DC
  Administered 2015-02-20: 2 ug/min via INTRAVENOUS
  Filled 2015-02-20: qty 16

## 2015-02-20 MED ORDER — INFLUENZA VAC SPLIT QUAD 0.5 ML IM SUSY: 0.5000 mL | PREFILLED_SYRINGE | INTRAMUSCULAR | Status: DC | PRN

## 2015-02-20 MED ORDER — DIPHENHYDRAMINE HCL 50 MG/ML IJ SOLN
12.5000 mg | Freq: Two times a day (BID) | INTRAMUSCULAR | Status: DC | PRN
  Administered 2015-02-21: 12.5 mg via INTRAVENOUS
  Filled 2015-02-20: qty 1

## 2015-02-20 MED ORDER — SODIUM CHLORIDE 0.9 % IV SOLN
INTRAVENOUS | Status: DC
  Administered 2015-02-20 – 2015-02-21 (×3): 10 mL/h via INTRAVENOUS

## 2015-02-20 NOTE — Progress Notes (Signed)
Dr Florene Glen contacted r/t Pt's continued low BP (MAPs 60-64) on CRRT and inability to pull off fluid, Pt status reviewed and order received to restart Levophed drip for BP support.

## 2015-02-20 NOTE — Progress Notes (Signed)
[name] Progress Note   Subjective: pleasant, a little confused, CXR better again today , on [place] O 2 now  Filed Vitals:   02/20/15 0630 02/20/15 0645 02/20/15 0700 02/20/15 0800  BP: 115/80 109/49 104/45 104/46  Pulse: 92 53 26 45  Temp:   96.4 F (35.8 C)   TempSrc:   Oral   Resp: 23 10 9 13   Height:      Weight:      SpO2: 98% 98% 97% 99%   Exam: Alert, on FM O2 no jvd RRR, 3/6 hsm ,.no rub or gallop Chest mostly clear bilat today ! bs + , soft , NT, ND Ext no edema Neuro alert, Ox 3 LUA AVF +bruit  ECHO normal LVEF 10/20  [facility] MWF   4h  77kg  3/2.0 bath  Hep 4000  LUA AVF Venofer 33mb q wed hd  Other op lab hgb 12.5 oct 14 , ca 9.2 phos 6.1 pth 213  Assessment/Plan 1. Severe AS =   SP TAVR. Slow recovery given afib, sig MV disease 2. Hypoxemia/ pulm edema/ vol excess- starting to stabilize and CXR clearing up. Oxygen requirement lowering.  3. Hypotension - much better, off pressors now, milrinone almost off 4. Afib - on po dilt and MTP 5. ESRD on CRRT now D #2 6. 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 Hx CAD/ CM 9. Vol up 2-3kg by wts now, slow improvement  Plan - cont UF 50-100 cc/ hr, use SBP of 90 threshold for weaning pressors. Anticipate dc CRRT 24-48 hrs   Kelly Splinter MD [name] pager [phone]    cell [phone] 02/20/2015, 10:47 AM    Recent Labs Lab 02/19/15 0345 02/19/15 1600 02/20/15 0402  NA 130* 135 131*  K 4.3 4.6 4.2  CL 95* 101 98*  CO2 27 27 28   GLUCOSE 116* 137* 122*  BUN 23* 21* 17  CREATININE 3.22* 2.77* 2.37*  CALCIUM 8.6* 8.5* 8.1*  PHOS 3.3 2.5 2.3*    Recent Labs Lab 02/19/15 0345 02/19/15 1600 02/20/15 0402  AST  --   --  133*  ALT  --   --  245*  ALKPHOS  --   --  60  BILITOT  --   --  0.8  PROT  --   --  5.0*  ALBUMIN 2.8* 2.5* 2.6*    Recent Labs Lab 02/18/15 0404 02/19/15 0345 02/20/15 0402  WBC 10.2 8.5 8.2  HGB 9.4* 9.4* 9.8*  HCT 28.7* 29.3*  30.2*  MCV 105.9* 106.5* 109.0*  PLT 101* 69* 60*   . acetaminophen  1,000 mg Oral 4 times per day  . antiseptic oral rinse  7 mL Mouth Rinse BID  . calcitRIOL  0.25 mcg Oral Daily  . diltiazem  180 mg Oral QHS  . lipase/protease/amylase  48,000 Units Oral BID WC  . metoprolol tartrate  12.5 mg Oral BID  . multivitamin  1 tablet Oral QHS  . pantoprazole  40 mg Oral Daily  . sevelamer carbonate  1,600 mg Oral TID WC  . sodium chloride  10-40 mL Intracatheter Q12H  . sodium chloride  3 mL Intravenous Q12H  . warfarin  2.5 mg Oral q1800  . Warfarin - Physician Dosing Inpatient   Does not apply q1800   . sodium chloride 10 mL/hr at 02/20/15 0800  . diltiazem (CARDIZEM) infusion Stopped (02/18/15 1005)  . norepinephrine (LEVOPHED) Adult infusion 4 mcg/min (02/20/15 0800)  . dialysis replacement  fluid (prismasate) 400 mL/hr at 02/20/15 0907  . dialysis replacement fluid (prismasate) 200 mL/hr at 02/19/15 1942  . dialysate (PRISMASATE) 1,800 mL/hr at 02/20/15 0639   sodium chloride, sodium chloride, sodium chloride, alteplase, fentaNYL (SUBLIMAZE) injection, heparin, heparin, hydrOXYzine, Influenza vac split quadrivalent PF, lidocaine (PF), lidocaine-prilocaine, metoprolol, ondansetron (ZOFRAN) IV, oxyCODONE, pentafluoroprop-tetrafluoroeth, sodium chloride, sodium chloride, sodium chloride

## 2015-02-20 NOTE — Progress Notes (Signed)
PROGRESS NOTE  Subjective:   74 yo with hx of HTN, AS, CKD stage 4, afib  S/p TAVR. Slow progress   Objective:    Vital Signs:   Temp:  [96.4 F (35.8 C)-97.7 F (36.5 C)] 97.7 F (36.5 C) (10/22 1100) Pulse Rate:  [26-120] 108 (10/22 1100) Resp:  [9-28] 13 (10/22 1100) BP: (81-154)/(41-86) 112/45 mmHg (10/22 1100) SpO2:  [74 %-100 %] 94 % (10/22 1100) FiO2 (%):  [55 %-100 %] 55 % (10/22 0400) Weight:  [176 lb 2.4 oz (79.9 kg)] 176 lb 2.4 oz (79.9 kg) (10/22 0545)      24-hour weight change: Weight change:   Weight trends: Filed Weights   02/18/15 0500 02/19/15 0745 02/20/15 0545  Weight: 182 lb 12.2 oz (82.9 kg) 179 lb 0.2 oz (81.2 kg) 176 lb 2.4 oz (79.9 kg)    Intake/Output:  10/21 0701 - 10/22 0700 In: 828.2 [P.O.:600; I.V.:228.2] Out: 3301 [Chest Tube:2390] Total I/O In: 43.8 [I.V.:43.8] Out: 676 [Other:636; Chest Tube:40]   Physical Exam: BP 112/45 mmHg  Pulse 108  Temp(Src) 97.7 F (36.5 C) (Oral)  Resp 13  Ht 5\' 8"  (1.727 m)  Wt 176 lb 2.4 oz (79.9 kg)  BMI 26.79 kg/m2  SpO2 94%  Wt Readings from Last 3 Encounters:  02/20/15 176 lb 2.4 oz (79.9 kg)  02/11/15 164 lb 1.6 oz (74.435 kg)  02/08/15 163 lb (73.936 kg)    General: Vital signs reviewed ,  Chronically ill appearing man  Head: Normocephalic, atraumatic.  Eyes: conjunctivae/corneas clear.  EOM's intact.   Throat: normal  Neck:  lines in place   Lungs:    chest tube in place. , few rhonchi   Heart:  irreg. Irreg. Tachy   Abdomen:  Soft, non-tender, non-distended    Extremities: No clubbing , cyanosis    Neurologic: A&O X3, CN II - XII are grossly intact.   Psych: Normal     Labs: BMET:  Recent Labs  02/19/15 0345 02/19/15 1600 02/20/15 0402  NA 130* 135 131*  K 4.3 4.6 4.2  CL 95* 101 98*  CO2 27 27 28   GLUCOSE 116* 137* 122*  BUN 23* 21* 17  CREATININE 3.22* 2.77* 2.37*  CALCIUM 8.6* 8.5* 8.1*  MG 2.1  --  2.3  PHOS 3.3 2.5 2.3*    Liver function  tests:  Recent Labs  02/19/15 1600 02/20/15 0402  AST  --  133*  ALT  --  245*  ALKPHOS  --  60  BILITOT  --  0.8  PROT  --  5.0*  ALBUMIN 2.5* 2.6*   No results for input(s): LIPASE, AMYLASE in the last 72 hours.  CBC:  Recent Labs  02/19/15 0345 02/20/15 0402  WBC 8.5 8.2  HGB 9.4* 9.8*  HCT 29.3* 30.2*  MCV 106.5* 109.0*  PLT 69* 60*    Cardiac Enzymes: No results for input(s): CKTOTAL, CKMB, TROPONINI in the last 72 hours.  Coagulation Studies:  Recent Labs  02/18/15 0404 02/19/15 0345 02/20/15 0402  LABPROT 20.7* 17.9* 18.0*  INR 1.78* 1.47 1.48    Other: Invalid input(s): POCBNP No results for input(s): DDIMER in the last 72 hours. No results for input(s): HGBA1C in the last 72 hours. No results for input(s): CHOL, HDL, LDLCALC, TRIG, CHOLHDL in the last 72 hours. No results for input(s): TSH, T4TOTAL, T3FREE, THYROIDAB in the last 72 hours.  Invalid input(s): FREET3 No results for input(s): VITAMINB12, FOLATE, FERRITIN, TIBC, IRON,  RETICCTPCT in the last 72 hours.   Other results:  Tele  ( personally reviewed ) - atrial fib with RVR  .   Medications:    Infusions: . sodium chloride 10 mL/hr at 02/20/15 0800  . diltiazem (CARDIZEM) infusion Stopped (02/18/15 1005)  . norepinephrine (LEVOPHED) Adult infusion Stopped (02/20/15 1030)  . dialysis replacement fluid (prismasate) 400 mL/hr at 02/20/15 0907  . dialysis replacement fluid (prismasate) 200 mL/hr at 02/19/15 1942  . dialysate (PRISMASATE) 1,800 mL/hr at 02/20/15 5929    Scheduled Medications: . acetaminophen  1,000 mg Oral 4 times per day  . antiseptic oral rinse  7 mL Mouth Rinse BID  . calcitRIOL  0.25 mcg Oral Daily  . diltiazem  180 mg Oral QHS  . lipase/protease/amylase  48,000 Units Oral BID WC  . metoprolol tartrate  12.5 mg Oral BID  . multivitamin  1 tablet Oral QHS  . pantoprazole  40 mg Oral Daily  . sevelamer carbonate  1,600 mg Oral TID WC  . sodium chloride  10-40  mL Intracatheter Q12H  . sodium chloride  3 mL Intravenous Q12H  . warfarin  2.5 mg Oral q1800  . Warfarin - Physician Dosing Inpatient   Does not apply q1800    Assessment/ Plan:   Principal Problem:   S/P TAVR (transcatheter aortic valve replacement) Active Problems:   Essential hypertension   HOCM (hypertrophic obstructive cardiomyopathy) (HCC)   Aortic stenosis, severe   Diastolic dysfunction   Chronic diastolic CHF, class 3   Chronic kidney disease, stage V (HCC)   Chronic atrial fibrillation (HCC)   Dyspnea   Severe aortic stenosis  1. Aortic stenosis:  S/p TAVR :,  Slow progress Further plans per TCTS team   2. Atrial fib - continue coumadin  Rate is a little fast, continue to titrate Diltiazem as tolerated/ needed.    . CKD - now on CVVH     Disposition:  Length of Stay: 4  Thayer Headings, Brooke Bonito., MD, [facility] 02/20/2015, 12:44 PM Office [phone] Pager [phone]

## 2015-02-20 NOTE — Progress Notes (Signed)
Patient ID: [name], male   DOB: [birth year], 74 y.o.   MRN: [id number] TCTS DAILY ICU PROGRESS NOTE                   [address]            [place] [address]          [phone]   4 Days Post-Op Procedure(s) (LRB): TRANSCATHETER AORTIC VALVE REPLACEMENT, TRANSAPICAL (N/A) TRANSESOPHAGEAL ECHOCARDIOGRAM (TEE) (N/A)  Total Length of Stay:  LOS: 4 days   Subjective: Breathing better   Objective: Vital signs in last 24 hours: Temp:  [96.4 F (35.8 C)-98.4 F (36.9 C)] 96.4 F (35.8 C) (10/22 0700) Pulse Rate:  [26-120] 45 (10/22 0800) Cardiac Rhythm:  [-] Atrial fibrillation (10/22 0800) Resp:  [9-31] 13 (10/22 0800) BP: (81-154)/(41-80) 104/46 mmHg (10/22 0800) SpO2:  [74 %-100 %] 99 % (10/22 0800) FiO2 (%):  [55 %-100 %] 55 % (10/22 0400) Weight:  [176 lb 2.4 oz (79.9 kg)] 176 lb 2.4 oz (79.9 kg) (10/22 0545)  Filed Weights   02/18/15 0500 02/19/15 0745 02/20/15 0545  Weight: 182 lb 12.2 oz (82.9 kg) 179 lb 0.2 oz (81.2 kg) 176 lb 2.4 oz (79.9 kg)    Weight change:    Hemodynamic parameters for last 24 hours:    Intake/Output from previous day: 10/21 0701 - 10/22 0700 In: 828.2 [P.O.:600; I.V.:228.2] Out: 3301 [Chest Tube:2390]  Intake/Output this shift: Total I/O In: 13.8 [I.V.:13.8] Out: 263 [Other:223; Chest Tube:40]  Current Meds: Scheduled Meds: . acetaminophen  1,000 mg Oral 4 times per day  . antiseptic oral rinse  7 mL Mouth Rinse BID  . calcitRIOL  0.25 mcg Oral Daily  . diltiazem  180 mg Oral QHS  . lipase/protease/amylase  48,000 Units Oral BID WC  . metoprolol tartrate  12.5 mg Oral BID  . multivitamin  1 tablet Oral QHS  . pantoprazole  40 mg Oral Daily  . sevelamer carbonate  1,600 mg Oral TID WC  . sodium chloride  10-40 mL Intracatheter Q12H  . sodium chloride  3 mL Intravenous Q12H  . warfarin  2.5 mg Oral q1800  . Warfarin - Physician Dosing Inpatient   Does not apply q1800   Continuous Infusions: .  sodium chloride 10 mL/hr at 02/20/15 0800  . diltiazem (CARDIZEM) infusion Stopped (02/18/15 1005)  . norepinephrine (LEVOPHED) Adult infusion 4 mcg/min (02/20/15 0800)  . dialysis replacement fluid (prismasate) 400 mL/hr at 02/20/15 0907  . dialysis replacement fluid (prismasate) 200 mL/hr at 02/19/15 1942  . dialysate (PRISMASATE) 1,800 mL/hr at 02/20/15 0639   PRN Meds:.sodium chloride, sodium chloride, sodium chloride, alteplase, fentaNYL (SUBLIMAZE) injection, heparin, heparin, hydrOXYzine, Influenza vac split quadrivalent PF, lidocaine (PF), lidocaine-prilocaine, metoprolol, ondansetron (ZOFRAN) IV, oxyCODONE, pentafluoroprop-tetrafluoroeth, sodium chloride, sodium chloride, sodium chloride  General appearance: alert and cooperative Neurologic: intact Heart: irregularly irregular rhythm Lungs: diminished breath sounds bibasilar Abdomen: soft, non-tender; bowel sounds normal; no masses,  no organomegaly Extremities: extremities normal, atraumatic, no cyanosis or edema and Homans sign is negative, no sign of DVT Wound: intact  Lab Results: CBC: Recent Labs  02/19/15 0345 02/20/15 0402  WBC 8.5 8.2  HGB 9.4* 9.8*  HCT 29.3* 30.2*  PLT 69* 60*   BMET:  Recent Labs  02/19/15 1600 02/20/15 0402  NA 135 131*  K 4.6 4.2  CL 101 98*  CO2 27 28  GLUCOSE 137* 122*  BUN 21* 17  CREATININE 2.77* 2.37*  CALCIUM 8.5* 8.1*    PT/INR:  Recent Labs  02/20/15 0402  LABPROT 18.0*  INR 1.48   Radiology: Dg Chest Port 1 View  02/20/2015  CLINICAL DATA:  Congestive heart failure.  Chest tubes. EXAM: PORTABLE CHEST 1 VIEW COMPARISON:  02/19/2015 FINDINGS: Bilateral jugular catheters and chest tubes are unchanged. Cardiac silhouette remains enlarged. Sequelae of prior transcatheter aortic valve replacement are again identified. Thoracic aortic calcification is noted. There are likely small bilateral pleural effusions. The retrocardiac left lower lobe opacity is unchanged. There is  minimal right basilar atelectasis. No overt pulmonary edema or pneumothorax is seen. IMPRESSION: 1. Small pleural effusions and left greater than right basilar atelectasis. 2. No pneumothorax. Electronically Signed   By: Logan Bores M.D.   On: 02/20/2015 08:01   Dg Chest Port 1 View  02/19/2015  CLINICAL DATA:  Right pleural effusion.  Chest tube in place EXAM: PORTABLE CHEST 1 VIEW COMPARISON:  02/19/2015 FINDINGS: Cardiomegaly again noted. Stable left IJ catheter position. Stable right IJ sheath position. Right chest tube. The right pleural effusion has resolved. No pulmonary edema. No pneumothorax. Persistent small left pleural effusion left basilar atelectasis. IMPRESSION: Right pleural effusion has resolved. No pulmonary edema. Right chest tube in place. No pneumothorax Electronically Signed   By: Lahoma Crocker M.D.   On: 02/19/2015 11:35     Assessment/Plan: S/P Procedure(s) (LRB): TRANSCATHETER AORTIC VALVE REPLACEMENT, TRANSAPICAL (N/A) TRANSESOPHAGEAL ECHOCARDIOGRAM (TEE) (N/A)   Overall stable POD 4  now on cvvh Chronic persistent Afib  Stable BP off Neo and levophed, still on low dose milrinone CKD stage V on CVVHD - BUN/creatinine falling nicely Chronic anemia - stable Post op thrombocytopenia    Continue low dose lopressor and diltiazem for rate control, can titrate metoprolol up if necessary  Off  Milrinone, still on levphed low dose   CVVHD per Nephrology , stop soon not getting much fluid now   Continue coumadin  Watch platelet count and hold ASA for now, plts 60,000   Leave bilaterial chest tubes 2390 out of both tubes past 24 hours  Mobilize as much as possible  Keep in SICU Discussed with renal service   [name] 02/20/2015 10:27 AM

## 2015-02-20 NOTE — Progress Notes (Signed)
Patient ID: [name], male   DOB: 1941/04/13, 74 y.o.   MRN: [id number] EVENING ROUNDS NOTE :     [address]       [place] [address]             [phone]                 4 Days Post-Op Procedure(s) (LRB): TRANSCATHETER AORTIC VALVE REPLACEMENT, TRANSAPICAL (N/A) TRANSESOPHAGEAL ECHOCARDIOGRAM (TEE) (N/A)  Total Length of Stay:  LOS: 4 days  BP 115/71 mmHg  Pulse 32  Temp(Src) 97.6 F (36.4 C) (Oral)  Resp 18  Ht 5\' 8"  (1.727 m)  Wt 176 lb 2.4 oz (79.9 kg)  BMI 26.79 kg/m2  SpO2 95%  .Intake/Output      10/22 0701 - 10/23 0700   P.O. 300   I.V. (mL/kg) 113.8 (1.4)   Total Intake(mL/kg) 413.8 (5.2)   Urine (mL/kg/hr) 200 (0.2)   Other 1190 (1.2)   Chest Tube 240 (0.2)   Total Output 1630   Net -1216.2         . sodium chloride 10 mL/hr at 02/20/15 0800  . diltiazem (CARDIZEM) infusion Stopped (02/18/15 1005)  . norepinephrine (LEVOPHED) Adult infusion Stopped (02/20/15 1030)  . dialysis replacement fluid (prismasate) 400 mL/hr at 02/20/15 1730  . dialysis replacement fluid (prismasate) 200 mL/hr at 02/20/15 1730  . dialysate (PRISMASATE) 1,800 mL/hr at 02/20/15 1305     Lab Results  Component Value Date   WBC 8.2 02/20/2015   HGB 9.8* 02/20/2015   HCT 30.2* 02/20/2015   PLT 60* 02/20/2015   GLUCOSE 105* 02/20/2015   ALT 245* 02/20/2015   AST 133* 02/20/2015   NA 133* 02/20/2015   K 5.0 02/20/2015   CL 101 02/20/2015   CREATININE 2.32* 02/20/2015   BUN 17 02/20/2015   CO2 28 02/20/2015   TSH 2.46 04/28/2014   INR 1.48 02/20/2015   HGBA1C 5.9* 02/11/2015   Off levophed chronic afib 120's  Restart cardiezem   Grace Isaac MD  Beeper [phone] Office [phone] 02/20/2015 7:29 PM

## 2015-02-20 NOTE — Progress Notes (Deleted)
[facility] Progress Note   Subjective: pleasant, a little confused, CXR better again today , on [place] O 2 now  Filed Vitals:   02/20/15 0630 02/20/15 0645 02/20/15 0700 02/20/15 0800  BP: 115/80 109/49 104/45 104/46  Pulse: 92 53 26 45  Temp:   96.4 F (35.8 C)   TempSrc:   Oral   Resp: 23 10 9 13   Height:      Weight:      SpO2: 98% 98% 97% 99%   Exam: Alert, on FM O2 no jvd RRR, 3/6 hsm ,.no rub or gallop Chest mostly clear bilat today ! bs + , soft , NT, ND Ext no edema Neuro alert, Ox 3 LUA AVF +bruit  ECHO normal LVEF 10/20  [facility] MWF   4h  77kg  3/2.0 bath  Hep 4000  LUA AVF Venofer 26mb q wed hd  Other op lab hgb 12.5 oct 14 , ca 9.2 phos 6.1 pth 213  Assessment/Plan 1. Severe AS =   SP TAVR. Slow recovery given afib, sig MV disease and hypotension (related to afib medications in part) 2. Hypoxemia/ pulm edema/ vol excess- starting to stabilize and CXR clearing up. Oxygen requirement lowering.  3. Hypotension - back on pressors, getting dilt/ MTP po for afib 4. Afib - on po dilt and MTP 5. ESRD cont CRRT 6. 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 Hx CAD/ CM 9. Vol up 2-3kg by wts now, slow improvement  Plan - cont UF 50-100 cc/ hr, use SBP of 90 threshold for weaning pressors. Anticipate dc CRRT 24-48 hrs   Kelly Splinter MD [facility] pager [phone]    cell [phone] 02/20/2015, 10:47 AM    Recent Labs Lab 02/19/15 0345 02/19/15 1600 02/20/15 0402  NA 130* 135 131*  K 4.3 4.6 4.2  CL 95* 101 98*  CO2 27 27 28   GLUCOSE 116* 137* 122*  BUN 23* 21* 17  CREATININE 3.22* 2.77* 2.37*  CALCIUM 8.6* 8.5* 8.1*  PHOS 3.3 2.5 2.3*    Recent Labs Lab 02/19/15 0345 02/19/15 1600 02/20/15 0402  AST  --   --  133*  ALT  --   --  245*  ALKPHOS  --   --  60  BILITOT  --   --  0.8  PROT  --   --  5.0*  ALBUMIN 2.8* 2.5* 2.6*    Recent Labs Lab 02/18/15 0404 02/19/15 0345 02/20/15 0402  WBC 10.2 8.5  8.2  HGB 9.4* 9.4* 9.8*  HCT 28.7* 29.3* 30.2*  MCV 105.9* 106.5* 109.0*  PLT 101* 69* 60*   . acetaminophen  1,000 mg Oral 4 times per day  . antiseptic oral rinse  7 mL Mouth Rinse BID  . calcitRIOL  0.25 mcg Oral Daily  . diltiazem  180 mg Oral QHS  . lipase/protease/amylase  48,000 Units Oral BID WC  . metoprolol tartrate  12.5 mg Oral BID  . multivitamin  1 tablet Oral QHS  . pantoprazole  40 mg Oral Daily  . sevelamer carbonate  1,600 mg Oral TID WC  . sodium chloride  10-40 mL Intracatheter Q12H  . sodium chloride  3 mL Intravenous Q12H  . warfarin  2.5 mg Oral q1800  . Warfarin - Physician Dosing Inpatient   Does not apply q1800   . sodium chloride 10 mL/hr at 02/20/15 0800  . diltiazem (CARDIZEM) infusion Stopped (02/18/15 1005)  . norepinephrine (LEVOPHED) Adult infusion 4 mcg/min (  02/20/15 0800)  . dialysis replacement fluid (prismasate) 400 mL/hr at 02/20/15 0907  . dialysis replacement fluid (prismasate) 200 mL/hr at 02/19/15 1942  . dialysate (PRISMASATE) 1,800 mL/hr at 02/20/15 0639   sodium chloride, sodium chloride, sodium chloride, alteplase, fentaNYL (SUBLIMAZE) injection, heparin, heparin, hydrOXYzine, Influenza vac split quadrivalent PF, lidocaine (PF), lidocaine-prilocaine, metoprolol, ondansetron (ZOFRAN) IV, oxyCODONE, pentafluoroprop-tetrafluoroeth, sodium chloride, sodium chloride, sodium chloride

## 2015-02-21 ENCOUNTER — Inpatient Hospital Stay (HOSPITAL_COMMUNITY): Payer: MEDICARE

## 2015-02-21 LAB — BASIC METABOLIC PANEL
Anion gap: 5 (ref 5–15)
BUN: 16 mg/dL (ref 6–20)
CO2: 28 mmol/L (ref 22–32)
Calcium: 8.3 mg/dL — ABNORMAL LOW (ref 8.9–10.3)
Chloride: 99 mmol/L — ABNORMAL LOW (ref 101–111)
Creatinine, Ser: 2.15 mg/dL — ABNORMAL HIGH (ref 0.61–1.24)
GFR calc Af Amer: 33 mL/min — ABNORMAL LOW (ref >60)
GFR calc non Af Amer: 29 mL/min — ABNORMAL LOW (ref >60)
Glucose, Bld: 101 mg/dL — ABNORMAL HIGH (ref 65–99)
Potassium: 4.7 mmol/L (ref 3.5–5.1)
Sodium: 132 mmol/L — ABNORMAL LOW (ref 135–145)

## 2015-02-21 LAB — PROTIME-INR
INR: 1.59 — ABNORMAL HIGH (ref 0.00–1.49)
PROTHROMBIN TIME: 19 s — AB (ref 11.6–15.2)

## 2015-02-21 LAB — PHOSPHORUS: PHOSPHORUS: 2 mg/dL — AB (ref 2.5–4.6)

## 2015-02-21 LAB — CBC
HCT: 31.8 % — ABNORMAL LOW (ref 39.0–52.0)
Hemoglobin: 10.1 g/dL — ABNORMAL LOW (ref 13.0–17.0)
MCH: 34.6 pg — ABNORMAL HIGH (ref 26.0–34.0)
MCHC: 31.8 g/dL (ref 30.0–36.0)
MCV: 108.9 fL — ABNORMAL HIGH (ref 78.0–100.0)
Platelets: 58 10*3/uL — ABNORMAL LOW (ref 150–400)
RBC: 2.92 MIL/uL — ABNORMAL LOW (ref 4.22–5.81)
RDW: 15.6 % — ABNORMAL HIGH (ref 11.5–15.5)
WBC: 7.6 10*3/uL (ref 4.0–10.5)

## 2015-02-21 LAB — ALBUMIN: Albumin: 2.5 g/dL — ABNORMAL LOW (ref 3.5–5.0)

## 2015-02-21 LAB — MAGNESIUM: MAGNESIUM: 2.4 mg/dL (ref 1.7–2.4)

## 2015-02-21 LAB — APTT: APTT: 38 s — AB (ref 24–37)

## 2015-02-21 NOTE — Progress Notes (Signed)
Patient ID: [name], male   DOB: [birth year], 74 y.o.   MRN: [id number] EVENING ROUNDS NOTE :     [address]       [place] [address]             [phone]                 5 Days Post-Op Procedure(s) (LRB): TRANSCATHETER AORTIC VALVE REPLACEMENT, TRANSAPICAL (N/A) TRANSESOPHAGEAL ECHOCARDIOGRAM (TEE) (N/A)  Total Length of Stay:  LOS: 5 days  BP 94/52 mmHg  Pulse 79  Temp(Src) 98.6 F (37 C) (Oral)  Resp 18  Ht 5\' 8"  (1.727 m)  Wt 173 lb 11.6 oz (78.8 kg)  BMI 26.42 kg/m2  SpO2 100%  .Intake/Output      10/22 0701 - 10/23 0700 10/23 0701 - 10/24 0700   P.O. 780    I.V. (mL/kg) 243.8 (3.1) 100 (1.3)   Total Intake(mL/kg) 1023.8 (13) 100 (1.3)   Urine (mL/kg/hr) 200 (0.1)    Other 2088 (1.1) 193 (0.2)   Chest Tube 730 (0.4)    Total Output 3018 193   Net -1994.2 -93          . sodium chloride 10 mL/hr (02/20/15 2316)  . diltiazem (CARDIZEM) infusion Stopped (02/18/15 1005)  . norepinephrine (LEVOPHED) Adult infusion Stopped (02/20/15 1030)     Lab Results  Component Value Date   WBC 7.6 02/21/2015   HGB 10.1* 02/21/2015   HCT 31.8* 02/21/2015   PLT 58* 02/21/2015   GLUCOSE 101* 02/21/2015   ALT 245* 02/20/2015   AST 133* 02/20/2015   NA 132* 02/21/2015   K 4.7 02/21/2015   CL 99* 02/21/2015   CREATININE 2.15* 02/21/2015   BUN 16 02/21/2015   CO2 28 02/21/2015   TSH 2.46 04/28/2014   INR 1.59* 02/21/2015   HGBA1C 5.9* 02/11/2015   Monitor chest tube output Hemodialysis tomorrow  Grace Isaac MD  Beeper [phone] Office [phone] 02/21/2015 5:46 PM

## 2015-02-21 NOTE — Progress Notes (Addendum)
Patient ID: Kamonte Mcmichen, male   DOB: 07/22/1940, 74 y.o.   MRN: [id number] Patient ID: Lesley Galentine, male   DOB: [birth year], 74 y.o.   MRN: [id number] TCTS DAILY ICU PROGRESS NOTE                   [address]            [place] [address]          [phone]   5 Days Post-Op Procedure(s) (LRB): TRANSCATHETER AORTIC VALVE REPLACEMENT, TRANSAPICAL (N/A) TRANSESOPHAGEAL ECHOCARDIOGRAM (TEE) (N/A)  Total Length of Stay:  LOS: 5 days   Subjective: Breathing better this am, stopping cvvh today  Objective: Vital signs in last 24 hours: Temp:  [97.2 F (36.2 C)-99.7 F (37.6 C)] 97.5 F (36.4 C) (10/23 0700) Pulse Rate:  [26-149] 60 (10/23 0900) Cardiac Rhythm:  [-] Atrial fibrillation (10/23 0900) Resp:  [13-31] 17 (10/23 0900) BP: (80-130)/(45-98) 123/56 mmHg (10/23 0900) SpO2:  [77 %-100 %] 100 % (10/23 0900) FiO2 (%):  [55 %] 55 % (10/23 0400) Weight:  [173 lb 11.6 oz (78.8 kg)] 173 lb 11.6 oz (78.8 kg) (10/23 0630)  Filed Weights   02/19/15 0745 02/20/15 0545 02/21/15 0630  Weight: 179 lb 0.2 oz (81.2 kg) 176 lb 2.4 oz (79.9 kg) 173 lb 11.6 oz (78.8 kg)    Weight change: -5 lb 4.7 oz (-2.4 kg)   Hemodynamic parameters for last 24 hours:    Intake/Output from previous day: 10/22 0701 - 10/23 0700 In: 1023.8 [P.O.:780; I.V.:243.8] Out: 3018 [Urine:200; Chest Tube:730]  Intake/Output this shift: Total I/O In: 20 [I.V.:20] Out: 193 [Other:193]  Current Meds: Scheduled Meds: . acetaminophen  1,000 mg Oral 4 times per day  . antiseptic oral rinse  7 mL Mouth Rinse BID  . calcitRIOL  0.25 mcg Oral Daily  . diltiazem  180 mg Oral QHS  . lipase/protease/amylase  48,000 Units Oral BID WC  . metoprolol tartrate  12.5 mg Oral BID  . multivitamin  1 tablet Oral QHS  . pantoprazole  40 mg Oral Daily  . sevelamer carbonate  1,600 mg Oral TID WC  . sodium chloride  10-40 mL Intracatheter Q12H  . warfarin  2.5 mg Oral q1800  . Warfarin -  Physician Dosing Inpatient   Does not apply q1800   Continuous Infusions: . sodium chloride 10 mL/hr (02/20/15 2316)  . diltiazem (CARDIZEM) infusion Stopped (02/18/15 1005)  . norepinephrine (LEVOPHED) Adult infusion Stopped (02/20/15 1030)  . dialysis replacement fluid (prismasate) 400 mL/hr at 02/21/15 0557  . dialysis replacement fluid (prismasate) 200 mL/hr at 02/20/15 1730  . dialysate (PRISMASATE) 1,800 mL/hr at 02/21/15 0548   PRN Meds:.sodium chloride, sodium chloride, sodium chloride, alteplase, diphenhydrAMINE, fentaNYL (SUBLIMAZE) injection, heparin, heparin, hydrOXYzine, Influenza vac split quadrivalent PF, lidocaine (PF), lidocaine-prilocaine, metoprolol, ondansetron (ZOFRAN) IV, oxyCODONE, pentafluoroprop-tetrafluoroeth, sodium chloride, sodium chloride  General appearance: alert and cooperative Neurologic: intact Heart: irregularly irregular rhythm Lungs: diminished breath sounds bibasilar Abdomen: soft, non-tender; bowel sounds normal; no masses,  no organomegaly Extremities: extremities normal, atraumatic, no cyanosis or edema and Homans sign is negative, no sign of DVT Wound: intact  Lab Results: CBC:  Recent Labs  02/20/15 0402 02/21/15 0403  WBC 8.2 7.6  HGB 9.8* 10.1*  HCT 30.2* 31.8*  PLT 60* 58*   BMET:   Recent Labs  02/20/15 1624 02/21/15 0403  NA 133* 132*  K 5.0 4.7  CL 101 99*  CO2  28 28  GLUCOSE 105* 101*  BUN 17 16  CREATININE 2.32* 2.15*  CALCIUM 8.2* 8.3*    PT/INR:   Recent Labs  02/21/15 0403  LABPROT 19.0*  INR 1.59*   Radiology: Dg Chest Port 1 View  02/21/2015  CLINICAL DATA:  Bilateral chest tubes EXAM: PORTABLE CHEST 1 VIEW COMPARISON:  02/20/2015 FINDINGS: Stable right chest tube. Stable left chest tube versus mediastinal drain. No pneumothorax is seen. Cardiomegaly. Pulmonary vascular congestion without frank interstitial edema. Small left pleural effusion. Stable right IJ venous sheath. Stable left IJ dual lumen  venous catheter terminating in the distal left brachiocephalic vein. Prosthetic aortic valve. IMPRESSION: Stable chest tube/drains.  No pneumothorax is seen. Cardiomegaly with pulmonary vascular congestion. No frank interstitial edema. Small left pleural effusion. Additional stable support apparatus as above. Electronically Signed   By: Julian Hy M.D.   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

## 2015-02-21 NOTE — Progress Notes (Signed)
PROGRESS NOTE  Subjective:   74 yo with hx of HTN, AS, CKD stage 4, afib  S/p TAVR. Slow progress   Objective:    Vital Signs:   Temp:  [97.2 F (36.2 C)-99.7 F (37.6 C)] 97.5 F (36.4 C) (10/23 0700) Pulse Rate:  [26-149] 60 (10/23 0900) Resp:  [13-31] 17 (10/23 0900) BP: (80-130)/(45-98) 123/56 mmHg (10/23 0900) SpO2:  [77 %-100 %] 100 % (10/23 0900) FiO2 (%):  [55 %] 55 % (10/23 0400) Weight:  [173 lb 11.6 oz (78.8 kg)] 173 lb 11.6 oz (78.8 kg) (10/23 0630)      24-hour weight change: Weight change: -5 lb 4.7 oz (-2.4 kg)  Weight trends: Filed Weights   02/19/15 0745 02/20/15 0545 02/21/15 0630  Weight: 179 lb 0.2 oz (81.2 kg) 176 lb 2.4 oz (79.9 kg) 173 lb 11.6 oz (78.8 kg)    Intake/Output:  10/22 0701 - 10/23 0700 In: 1023.8 [P.O.:780; I.V.:243.8] Out: 3018 [Urine:200; Chest Tube:730] Total I/O In: 20 [I.V.:20] Out: 193 [Other:193]   Physical Exam: BP 123/56 mmHg  Pulse 60  Temp(Src) 97.5 F (36.4 C) (Oral)  Resp 17  Ht 5\' 8"  (1.727 m)  Wt 173 lb 11.6 oz (78.8 kg)  BMI 26.42 kg/m2  SpO2 100%  Wt Readings from Last 3 Encounters:  02/21/15 173 lb 11.6 oz (78.8 kg)  02/11/15 164 lb 1.6 oz (74.435 kg)  02/08/15 163 lb (73.936 kg)    General: Vital signs reviewed ,  Chronically ill appearing man  Head: Normocephalic, atraumatic.  Eyes: conjunctivae/corneas clear.  EOM's intact.   Throat: normal  Neck:  lines in place   Lungs:    chest tube in place. , few rhonchi   Heart:  irreg. Irreg. Tachy   Abdomen:  Soft, non-tender, non-distended    Extremities: No clubbing , cyanosis    Neurologic: A&O X3, CN II - XII are grossly intact.   Psych: Normal     Labs: BMET:  Recent Labs  02/20/15 0402 02/20/15 1624 02/21/15 0403  NA 131* 133* 132*  K 4.2 5.0 4.7  CL 98* 101 99*  CO2 28 28 28   GLUCOSE 122* 105* 101*  BUN 17 17 16   CREATININE 2.37* 2.32* 2.15*  CALCIUM 8.1* 8.2* 8.3*  MG 2.3  --  2.4  PHOS 2.3* 2.3* 2.0*    Liver  function tests:  Recent Labs  02/20/15 0402 02/20/15 1624 02/21/15 0403  AST 133*  --   --   ALT 245*  --   --   ALKPHOS 60  --   --   BILITOT 0.8  --   --   PROT 5.0*  --   --   ALBUMIN 2.6* 2.5* 2.5*   No results for input(s): LIPASE, AMYLASE in the last 72 hours.  CBC:  Recent Labs  02/20/15 0402 02/21/15 0403  WBC 8.2 7.6  HGB 9.8* 10.1*  HCT 30.2* 31.8*  MCV 109.0* 108.9*  PLT 60* 58*    Cardiac Enzymes: No results for input(s): CKTOTAL, CKMB, TROPONINI in the last 72 hours.  Coagulation Studies:  Recent Labs  02/19/15 0345 02/20/15 0402 02/21/15 0403  LABPROT 17.9* 18.0* 19.0*  INR 1.47 1.48 1.59*    Other: Invalid input(s): POCBNP No results for input(s): DDIMER in the last 72 hours. No results for input(s): HGBA1C in the last 72 hours. No results for input(s): CHOL, HDL, LDLCALC, TRIG, CHOLHDL in the last 72 hours. No results for input(s): TSH,  T4TOTAL, T3FREE, THYROIDAB in the last 72 hours.  Invalid input(s): FREET3 No results for input(s): VITAMINB12, FOLATE, FERRITIN, TIBC, IRON, RETICCTPCT in the last 72 hours.   Other results:  Tele  ( personally reviewed ) - atrial fib with RVR  .   Medications:    Infusions: . sodium chloride 10 mL/hr (02/20/15 2316)  . diltiazem (CARDIZEM) infusion Stopped (02/18/15 1005)  . norepinephrine (LEVOPHED) Adult infusion Stopped (02/20/15 1030)  . dialysis replacement fluid (prismasate) 400 mL/hr at 02/21/15 0557  . dialysis replacement fluid (prismasate) 200 mL/hr at 02/20/15 1730  . dialysate (PRISMASATE) 1,800 mL/hr at 02/21/15 0548    Scheduled Medications: . acetaminophen  1,000 mg Oral 4 times per day  . antiseptic oral rinse  7 mL Mouth Rinse BID  . calcitRIOL  0.25 mcg Oral Daily  . diltiazem  180 mg Oral QHS  . lipase/protease/amylase  48,000 Units Oral BID WC  . metoprolol tartrate  12.5 mg Oral BID  . multivitamin  1 tablet Oral QHS  . pantoprazole  40 mg Oral Daily  . sevelamer  carbonate  1,600 mg Oral TID WC  . sodium chloride  10-40 mL Intracatheter Q12H  . warfarin  2.5 mg Oral q1800  . Warfarin - Physician Dosing Inpatient   Does not apply q1800    Assessment/ Plan:   Principal Problem:   S/P TAVR (transcatheter aortic valve replacement) Active Problems:   Essential hypertension   HOCM (hypertrophic obstructive cardiomyopathy) (HCC)   Aortic stenosis, severe   Diastolic dysfunction   Chronic diastolic CHF, class 3   Chronic kidney disease, stage V (HCC)   Chronic atrial fibrillation (HCC)   Dyspnea   Severe aortic stenosis  1. Aortic stenosis:  S/p TAVR :,  Slow progress Further plans per TCTS team    2. Atrial fib - continue coumadin  Rate is well controlled   3. CKD -- no longer on CVVH   Disposition:  Length of Stay: 5  Thayer Headings, Brooke Bonito., MD, [facility] 02/21/2015, 10:13 AM Office [phone] Pager [phone]

## 2015-02-21 NOTE — Progress Notes (Signed)
[facility] Progress Note   Subjective: pleasant, a little confused, CXR stable today w/o gross edema. Wt's down to 78.8kg (dry wt 77)  Filed Vitals:   02/21/15 0800 02/21/15 0900 02/21/15 1000 02/21/15 1100  BP: 90/54 123/56 92/45 111/54  Pulse: 46 60 78 63  Temp:      TempSrc:      Resp: 19 17 12 16   Height:      Weight:      SpO2: 100% 100% 99% 80%   Exam: Alert, on FM O2 no jvd RRR, 3/6 hsm ,.no rub or gallop Chest mostly clear bilat today ! bs + , soft , NT, ND Ext no edema Neuro alert, Ox 3 LUA AVF +bruit  ECHO normal LVEF 10/20  [facility] MWF   4h  77kg  3/2.0 bath  Hep 4000  LUA AVF Venofer 50mb q wed hd  Other op lab hgb 12.5 oct 14 , ca 9.2 phos 6.1 pth 213  Assessment/Plan 1. Severe AS =   SP TAVR. Slow recovery given afib, sig MV disease 2. Pulm edema/ vol excess - mostly resolved 3. Volume up 2kg by wts now, improved 4. Hypotension - better, off pressors 5. Afib - on po dilt and MTP 6. ESRD on CRRT 7. 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 Hx CAD/ CM  Plan - stop CRRT, mobilize, HD tomorrow   Kelly Splinter MD [facility] pager [phone]    cell [phone] 02/21/2015, 11:35 AM    Recent Labs Lab 02/20/15 0402 02/20/15 1624 02/21/15 0403  NA 131* 133* 132*  K 4.2 5.0 4.7  CL 98* 101 99*  CO2 28 28 28   GLUCOSE 122* 105* 101*  BUN 17 17 16   CREATININE 2.37* 2.32* 2.15*  CALCIUM 8.1* 8.2* 8.3*  PHOS 2.3* 2.3* 2.0*    Recent Labs Lab 02/20/15 0402 02/20/15 1624 02/21/15 0403  AST 133*  --   --   ALT 245*  --   --   ALKPHOS 60  --   --   BILITOT 0.8  --   --   PROT 5.0*  --   --   ALBUMIN 2.6* 2.5* 2.5*    Recent Labs Lab 02/19/15 0345 02/20/15 0402 02/21/15 0403  WBC 8.5 8.2 7.6  HGB 9.4* 9.8* 10.1*  HCT 29.3* 30.2* 31.8*  MCV 106.5* 109.0* 108.9*  PLT 69* 60* 58*   . acetaminophen  1,000 mg Oral 4 times per day  . antiseptic oral rinse  7 mL Mouth Rinse BID  . calcitRIOL  0.25  mcg Oral Daily  . diltiazem  180 mg Oral QHS  . lipase/protease/amylase  48,000 Units Oral BID WC  . metoprolol tartrate  12.5 mg Oral BID  . multivitamin  1 tablet Oral QHS  . pantoprazole  40 mg Oral Daily  . sevelamer carbonate  1,600 mg Oral TID WC  . sodium chloride  10-40 mL Intracatheter Q12H  . warfarin  2.5 mg Oral q1800  . Warfarin - Physician Dosing Inpatient   Does not apply q1800   . sodium chloride 10 mL/hr (02/20/15 2316)  . diltiazem (CARDIZEM) infusion Stopped (02/18/15 1005)  . norepinephrine (LEVOPHED) Adult infusion Stopped (02/20/15 1030)  . dialysis replacement fluid (prismasate) 400 mL/hr at 02/21/15 0557  . dialysis replacement fluid (prismasate) 200 mL/hr at 02/20/15 1730  . dialysate (PRISMASATE) 1,800 mL/hr at 02/21/15 0548   sodium chloride, sodium chloride, sodium chloride, alteplase, diphenhydrAMINE, fentaNYL (SUBLIMAZE) injection, heparin, heparin,  hydrOXYzine, Influenza vac split quadrivalent PF, lidocaine (PF), lidocaine-prilocaine, metoprolol, ondansetron (ZOFRAN) IV, oxyCODONE, pentafluoroprop-tetrafluoroeth, sodium chloride, sodium chloride

## 2015-02-22 ENCOUNTER — Inpatient Hospital Stay (HOSPITAL_COMMUNITY): Payer: MEDICARE

## 2015-02-22 LAB — CBC
HCT: 30.1 % — ABNORMAL LOW (ref 39.0–52.0)
Hemoglobin: 9.5 g/dL — ABNORMAL LOW (ref 13.0–17.0)
MCH: 34.5 pg — ABNORMAL HIGH (ref 26.0–34.0)
MCHC: 31.6 g/dL (ref 30.0–36.0)
MCV: 109.5 fL — ABNORMAL HIGH (ref 78.0–100.0)
Platelets: 67 10*3/uL — ABNORMAL LOW (ref 150–400)
RBC: 2.75 MIL/uL — ABNORMAL LOW (ref 4.22–5.81)
RDW: 16 % — ABNORMAL HIGH (ref 11.5–15.5)
WBC: 8.7 10*3/uL (ref 4.0–10.5)

## 2015-02-22 LAB — BASIC METABOLIC PANEL
Anion gap: 7 (ref 5–15)
BUN: 28 mg/dL — ABNORMAL HIGH (ref 6–20)
CO2: 26 mmol/L (ref 22–32)
Calcium: 8.7 mg/dL — ABNORMAL LOW (ref 8.9–10.3)
Chloride: 103 mmol/L (ref 101–111)
Creatinine, Ser: 3.39 mg/dL — ABNORMAL HIGH (ref 0.61–1.24)
GFR calc Af Amer: 19 mL/min — ABNORMAL LOW (ref >60)
GFR calc non Af Amer: 16 mL/min — ABNORMAL LOW (ref >60)
Glucose, Bld: 117 mg/dL — ABNORMAL HIGH (ref 65–99)
Potassium: 4.6 mmol/L (ref 3.5–5.1)
Sodium: 136 mmol/L (ref 135–145)

## 2015-02-22 LAB — PROTIME-INR
INR: 1.81 — AB (ref 0.00–1.49)
PROTHROMBIN TIME: 20.9 s — AB (ref 11.6–15.2)

## 2015-02-22 MED ORDER — SODIUM CHLORIDE 0.9 % IV SOLN: 100.0000 mL | INTRAVENOUS | Status: DC | PRN

## 2015-02-22 MED ORDER — ALTEPLASE 2 MG IJ SOLR
2.0000 mg | Freq: Once | INTRAMUSCULAR | Status: DC | PRN
  Filled 2015-02-22: qty 2

## 2015-02-22 MED ORDER — WARFARIN SODIUM 5 MG PO TABS
5.0000 mg | ORAL_TABLET | Freq: Every day | ORAL | Status: DC
  Administered 2015-02-22 – 2015-02-25 (×4): 5 mg via ORAL
  Filled 2015-02-22 (×6): qty 1

## 2015-02-22 MED ORDER — LIDOCAINE-PRILOCAINE 2.5-2.5 % EX CREA
1.0000 "application " | TOPICAL_CREAM | CUTANEOUS | Status: DC | PRN
  Filled 2015-02-22: qty 5

## 2015-02-22 MED ORDER — HEPARIN SODIUM (PORCINE) 1000 UNIT/ML DIALYSIS: 1600.0000 [IU] | INTRAMUSCULAR | Status: DC | PRN

## 2015-02-22 MED ORDER — PENTAFLUOROPROP-TETRAFLUOROETH EX AERO: 1.0000 "application " | INHALATION_SPRAY | CUTANEOUS | Status: DC | PRN

## 2015-02-22 MED ORDER — LIDOCAINE HCL (PF) 1 % IJ SOLN: 5.0000 mL | INTRAMUSCULAR | Status: DC | PRN

## 2015-02-22 MED ORDER — HEPARIN SODIUM (PORCINE) 1000 UNIT/ML DIALYSIS: 1000.0000 [IU] | INTRAMUSCULAR | Status: DC | PRN

## 2015-02-22 NOTE — Progress Notes (Signed)
[name]       [place] [address]             [phone]        CARDIOTHORACIC SURGERY PROGRESS NOTE   R6 Days Post-Op Procedure(s) (LRB): TRANSCATHETER AORTIC VALVE REPLACEMENT, TRANSAPICAL (N/A) TRANSESOPHAGEAL ECHOCARDIOGRAM (TEE) (N/A)  Subjective: Looks and feels much better.  Still dyspneic with activity but breathing much improved.  Denies pain except for soreness in chest w/ chest tubes whenever he is moving.  Slept 6 hours last night.  Eating well and moving much better.  Objective: Vital signs: BP Readings from Last 1 Encounters:  02/22/15 107/70   Pulse Readings from Last 1 Encounters:  02/22/15 62   Resp Readings from Last 1 Encounters:  02/22/15 28   Temp Readings from Last 1 Encounters:  02/22/15 99.2 F (37.3 C) Oral    Hemodynamics:    Physical Exam:  Rhythm:   Afib w/ controlled rate  Breath sounds: clear  Heart sounds:  irregular  Incisions:  Clean and dry  Abdomen:  Soft, non-distended, non-tender  Extremities:  Warm, well-perfused  Chest tubes:  Thin serosanguinous output R>L, trending down, no air leak    Intake/Output from previous day: 10/23 0701 - 10/24 0700 In: 1647.8 [P.O.:1380; I.V.:267.8] Out: 713 [Chest Tube:520] Intake/Output this shift:    Lab Results:  CBC: Recent Labs  02/20/15 0402 02/21/15 0403  WBC 8.2 7.6  HGB 9.8* 10.1*  HCT 30.2* 31.8*  PLT 60* 58*    BMET:  Recent Labs  02/20/15 1624 02/21/15 0403  NA 133* 132*  K 5.0 4.7  CL 101 99*  CO2 28 28  GLUCOSE 105* 101*  BUN 17 16  CREATININE 2.32* 2.15*  CALCIUM 8.2* 8.3*     PT/INR:   Recent Labs  02/21/15 0403  LABPROT 19.0*  INR 1.59*    CBG (last 3)  No results for input(s): GLUCAP in the last 72 hours.  ABG    Component Value Date/Time   PHART 7.520* 02/20/2015 0414   PCO2ART 32.0* 02/20/2015 0414   PO2ART 62.0* 02/20/2015 0414   HCO3 26.2* 02/20/2015 0414   TCO2 27 02/20/2015 0414   ACIDBASEDEF 2.0  02/17/2015 2204   O2SAT 94.0 02/20/2015 0414    CXR: PORTABLE CHEST 1 VIEW  COMPARISON: 02/20/2015  FINDINGS: Stable right chest tube. Stable left chest tube versus mediastinal drain. No pneumothorax is seen.  Cardiomegaly. Pulmonary vascular congestion without frank interstitial edema.  Small left pleural effusion.  Stable right IJ venous sheath. Stable left IJ dual lumen venous catheter terminating in the distal left brachiocephalic vein.  Prosthetic aortic valve.  IMPRESSION: Stable chest tube/drains. No pneumothorax is seen.  Cardiomegaly with pulmonary vascular congestion. No frank interstitial edema. Small left pleural effusion.  Additional stable support apparatus as above.   Electronically Signed  By: Julian Hy M.D.  On: 02/21/2015 09:11  Assessment/Plan: S/P Procedure(s) (LRB): TRANSCATHETER AORTIC VALVE REPLACEMENT, TRANSAPICAL (N/A) TRANSESOPHAGEAL ECHOCARDIOGRAM (TEE) (N/A)  Overall stable POD6 Chronic persistent Afib - good rate control on oral diltiazem and low dose metoprolol Stable BP off all drips Acute on chronic combined systolic and diastolic CHF - improved Expected post op volume excess, weight still 6 kg above baseline CKD stage V on CVVHD - labs stable Chronic anemia - stable Post op thrombocytopenia, expected, platelet count stable 58k today Bilateral pleural effusions w/ bilateral chest tubes, output trending down   Continue low dose lopressor and diltiazem for rate  control, can titrate metoprolol up if necessary  HD per Nephrology   Continue coumadin - increase dose  Watch platelet count and hold ASA for now  D/C left tube and leave right chest tube in place today   Mobilize as much as possible - reconsult PT  Keep in SICU - will transfer to stepdown tomorrow if he does well w/ HD today  I spent in excess of 15 minutes during the conduct of this hospital encounter and >50% of this time involved direct  face-to-face encounter with the patient for counseling and/or coordination of their care.   [name], MD 02/22/2015 8:59 AM

## 2015-02-22 NOTE — Progress Notes (Signed)
TCTS BRIEF SICU PROGRESS NOTE  6 Days Post-Op  S/P Procedure(s) (LRB): TRANSCATHETER AORTIC VALVE REPLACEMENT, TRANSAPICAL (N/A) TRANSESOPHAGEAL ECHOCARDIOGRAM (TEE) (N/A)   Stable day Did very well w/ conventional HD - stable BP and tolerated 2.5 L volume off  Plan: Continue current plan  [name], MD 02/22/2015 6:39 PM

## 2015-02-22 NOTE — Progress Notes (Signed)
PT Cancellation Note  Patient Details Name: [name] MRN: [id number] DOB: Sep 18, 1940   Cancelled Treatment:    Reason Eval/Treat Not Completed: Patient at procedure or test/unavailable.  Pt having dialysis an arrival.  Scheduled to be off after work hours.  Will try again to evaluate pt 10/25. 02/22/2015  Donnella Sham, PT [phone]  (pager)   Maizey Menendez, Tessie Fass 02/22/2015, 3:18 PM

## 2015-02-22 NOTE — Progress Notes (Signed)
[name] Progress Note   Subjective:  CRRT stopped yesterday Weight up 2 kg since it was stopped (if weights believable) Awake, alert, some left CP at site where chest tube just removed this AM  Filed Vitals:   02/22/15 0500 02/22/15 0600 02/22/15 0700 02/22/15 0817  BP: 120/64 118/50 107/70   Pulse: 93 75 62   Temp:    99.2 F (37.3 C)  TempSrc:    Oral  Resp: 26 21 28    Height:      Weight:  80.3 kg (177 lb 0.5 oz)    SpO2: 100% 100% 96%    Exam: Alert, on FM O2 no jvd Left IJ 3L HD cath in place AFib rate controlled Chest clear Ext trace edema bilaterally Neuro alert, Ox 3 LUA AVF +bruit Right CTube in place  Weight trending (EDW 77 kg) 10/19 81.6 10/20 82.9 10/21 81.2 10/23 78.8  CRRT stopped 10/24 80.3  ECHO normal LVEF 02/18/15  Dialysis prescription [facility] MWF   4h  77kg  3/2.0 bath  Hep 4000  LUA AVF Venofer 26mb q wed hd  Other op lab hgb 12.5 oct 14 , ca 9.2 phos 6.1 pth 213  Assessment/Plan 1. Severe AS =   SP TAVR/transapical approach 02/16/15.  2. Pulm edema/ vol excess - mostly resolved. Congestion without frank pulm edema on today's CXR 3. Hypotension - better, off pressors (10/23) 4. Afib - on po dilt and MTP, coumadin 5. ESRD - CRRT stopped 10/23. Transition back to IHD starting today. 6. Anemia - Hb 9.5. Not on ESA. Check Fe studies. Add Aranesp if iron replete.  7. Thrombocytopenia - felt post op related.  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 Hx CAD/ CM 10. Disposition - possible transfer to stepdown depending on how he does with HD  Jamal Maes, MD [place] Pager 02/22/2015, 11:23 AM  Additional Objective Data CXR - stable chest tubes. Vascular congestion without frank edema. Small left effusion. Prosthetic aortic valve.  Recent Labs Lab 02/20/15 0402 02/20/15 1624 02/21/15 0403 02/22/15 0330  NA 131* 133* 132* 136  K 4.2 5.0 4.7 4.6  CL 98* 101 99* 103  CO2 28 28 28 26   GLUCOSE 122*  105* 101* 117*  BUN 17 17 16  28*  CREATININE 2.37* 2.32* 2.15* 3.39*  CALCIUM 8.1* 8.2* 8.3* 8.7*  PHOS 2.3* 2.3* 2.0*  --     Recent Labs Lab 02/20/15 0402 02/20/15 1624 02/21/15 0403  AST 133*  --   --   ALT 245*  --   --   ALKPHOS 60  --   --   BILITOT 0.8  --   --   PROT 5.0*  --   --   ALBUMIN 2.6* 2.5* 2.5*    Recent Labs Lab 02/20/15 0402 02/21/15 0403 02/22/15 0330  WBC 8.2 7.6 8.7  HGB 9.8* 10.1* 9.5*  HCT 30.2* 31.8* 30.1*  MCV 109.0* 108.9* 109.5*  PLT 60* 58* 67*   Scheduled Medications . antiseptic oral rinse  7 mL Mouth Rinse BID  . calcitRIOL  0.25 mcg Oral Daily  . diltiazem  180 mg Oral QHS  . lipase/protease/amylase  48,000 Units Oral BID WC  . metoprolol tartrate  12.5 mg Oral BID  . multivitamin  1 tablet Oral QHS  . pantoprazole  40 mg Oral Daily  . sevelamer carbonate  1,600 mg Oral TID WC  . sodium chloride  10-40 mL Intracatheter Q12H  . warfarin  5 mg Oral q1800  .  Warfarin - Physician Dosing Inpatient   Does not apply q1800   Infusions: . sodium chloride 10 mL/hr (02/21/15 2112)  . diltiazem (CARDIZEM) infusion Stopped (02/18/15 1005)  . norepinephrine (LEVOPHED) Adult infusion Stopped (02/21/15 2100)   sodium chloride, diphenhydrAMINE, fentaNYL (SUBLIMAZE) injection, hydrOXYzine, Influenza vac split quadrivalent PF, metoprolol, ondansetron (ZOFRAN) IV, oxyCODONE, sodium chloride

## 2015-02-23 ENCOUNTER — Inpatient Hospital Stay (HOSPITAL_COMMUNITY): Payer: MEDICARE

## 2015-02-23 LAB — RENAL FUNCTION PANEL
ANION GAP: 10 (ref 5–15)
Albumin: 2.2 g/dL — ABNORMAL LOW (ref 3.5–5.0)
BUN: 29 mg/dL — ABNORMAL HIGH (ref 6–20)
CALCIUM: 8.7 mg/dL — AB (ref 8.9–10.3)
CHLORIDE: 97 mmol/L — AB (ref 101–111)
CO2: 29 mmol/L (ref 22–32)
Creatinine, Ser: 3.26 mg/dL — ABNORMAL HIGH (ref 0.61–1.24)
GFR calc Af Amer: 20 mL/min — ABNORMAL LOW (ref >60)
GFR calc non Af Amer: 17 mL/min — ABNORMAL LOW (ref >60)
GLUCOSE: 102 mg/dL — AB (ref 65–99)
Phosphorus: 2.8 mg/dL (ref 2.5–4.6)
Potassium: 4.1 mmol/L (ref 3.5–5.1)
SODIUM: 136 mmol/L (ref 135–145)

## 2015-02-23 LAB — IRON AND TIBC
IRON: 27 ug/dL — AB (ref 45–182)
Saturation Ratios: 10 % — ABNORMAL LOW (ref 17.9–39.5)
TIBC: 260 ug/dL (ref 250–450)
UIBC: 233 ug/dL

## 2015-02-23 LAB — TYPE AND SCREEN
ABO/RH(D): AB POS
Antibody Screen: NEGATIVE
UNIT DIVISION: 0
Unit division: 0

## 2015-02-23 LAB — CBC
HCT: 30.8 % — ABNORMAL LOW (ref 39.0–52.0)
HEMOGLOBIN: 9.8 g/dL — AB (ref 13.0–17.0)
MCH: 34.8 pg — ABNORMAL HIGH (ref 26.0–34.0)
MCHC: 31.8 g/dL (ref 30.0–36.0)
MCV: 109.2 fL — ABNORMAL HIGH (ref 78.0–100.0)
Platelets: 77 10*3/uL — ABNORMAL LOW (ref 150–400)
RBC: 2.82 MIL/uL — ABNORMAL LOW (ref 4.22–5.81)
RDW: 16.3 % — ABNORMAL HIGH (ref 11.5–15.5)
WBC: 9.9 10*3/uL (ref 4.0–10.5)

## 2015-02-23 LAB — PROTIME-INR
INR: 1.76 — ABNORMAL HIGH (ref 0.00–1.49)
Prothrombin Time: 20.5 seconds — ABNORMAL HIGH (ref 11.6–15.2)

## 2015-02-23 MED ORDER — SODIUM CHLORIDE 0.9 % IV SOLN: 250.0000 mL | INTRAVENOUS | Status: DC | PRN

## 2015-02-23 MED ORDER — SODIUM CHLORIDE 0.9 % IJ SOLN
3.0000 mL | Freq: Two times a day (BID) | INTRAMUSCULAR | Status: DC
  Administered 2015-02-23 – 2015-03-01 (×11): 3 mL via INTRAVENOUS

## 2015-02-23 MED ORDER — SEVELAMER CARBONATE 800 MG PO TABS
800.0000 mg | ORAL_TABLET | Freq: Three times a day (TID) | ORAL | Status: DC
Start: 2015-02-23 — End: 2015-03-01
  Administered 2015-02-23 – 2015-02-28 (×14): 800 mg via ORAL
  Filled 2015-02-23 (×13): qty 1

## 2015-02-23 MED ORDER — SODIUM CHLORIDE 0.9 % IJ SOLN: 3.0000 mL | INTRAMUSCULAR | Status: DC | PRN

## 2015-02-23 MED ORDER — SODIUM CHLORIDE 0.9 % IV SOLN
125.0000 mg | INTRAVENOUS | Status: DC
  Administered 2015-02-24 – 2015-03-01 (×3): 125 mg via INTRAVENOUS
  Filled 2015-02-23 (×4): qty 10

## 2015-02-23 MED ORDER — MOVING RIGHT ALONG BOOK
Freq: Once | Status: AC
  Administered 2015-02-23: 08:00:00
  Filled 2015-02-23: qty 1

## 2015-02-23 NOTE — Progress Notes (Signed)
Pt transferred to 2w24 via wheelchair with RN; wife to accompany transfer; belongings, SCD's, chart, and meds along with transfer; will cont. To monitor.  [name]

## 2015-02-23 NOTE — Care Management Important Message (Signed)
Important Message  Patient Details  Name: [name] MRN: [id number] Date of Birth: 04-07-1941   Medicare Important Message Given:  Yes-second notification given    Nathen May 02/23/2015, 1:49 PM

## 2015-02-23 NOTE — Progress Notes (Signed)
CARDIAC REHAB PHASE I   PRE:  Rate/Rhythm: 80 afib    BP: sitting 92/49    SaO2: wouldn't register  MODE:  Ambulation: 260 ft   POST:  Rate/Rhythm: 101 afib    BP: sitting 111/47     SaO2: 95 2L  Pt SaO2 would not register. Walked on RA. No major c/o, fairly steady. Stated his hips were sore on return to room. sts SOB is improving. Return to recliner. Will f/u. Encouraged x1 more walk today. [phone]   Josephina Shih [place] CES, ACSM 02/23/2015 12:14 PM

## 2015-02-23 NOTE — Evaluation (Signed)
Physical Therapy Evaluation Patient Details Name: [name] MRN: [id number] DOB: 09-22-1940 Today's Date: 02/23/2015   History of Present Illness  Patient is a 74 y/o male presents with severe aortic stenosis s/p TAVR. 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  Clinical Impression  Patient presents with generalized weakness, impaired endurance and balance deficits s/p above surgery impacting mobility. Encouraged use of RW for support during mobility. Fatigues easily and requires 2 standing rest breaks due to SOB today. Pt reports his wife works. Will need to find out if pt can have 24/7 supervision at home for the first few days at discharge. Will follow acutely to maximize independence and mobility prior to return home.    Follow Up Recommendations Home health PT;Supervision for mobility/OOB    Equipment Recommendations  Rolling walker with 5" wheels    Recommendations for Other Services OT consult     Precautions / Restrictions Precautions Precautions: Fall Restrictions Weight Bearing Restrictions: No      Mobility  Bed Mobility               General bed mobility comments: Sitting in recliner upon PT arrival.   Transfers Overall transfer level: Needs assistance Equipment used: None Transfers: Sit to/from Stand Sit to Stand: Supervision         General transfer comment: Supervision for safety.   Ambulation/Gait Ambulation/Gait assistance: Min guard Ambulation Distance (Feet): 150 Feet Assistive device: Rolling walker (2 wheeled) Gait Pattern/deviations: Step-through pattern;Decreased stride length;Trunk flexed;Drifts right/left   Gait velocity interpretation: <1.8 ft/sec, indicative of risk for recurrent falls General Gait Details: Slow, mildly unsteady gait with Min guard assist for safety. DOE. HR ranged from 89-96 bpm. 2 standing rest breaks. Cues for RW management.  Stairs            Wheelchair  Mobility    Modified Rankin (Stroke Patients Only)       Balance Overall balance assessment: Needs assistance Sitting-balance support: Feet supported;No upper extremity supported Sitting balance-Leahy Scale: Good     Standing balance support: During functional activity Standing balance-Leahy Scale: Fair Standing balance comment: Furniture walking within room - balance deficits noted. BUE support during ambulation.                             Pertinent Vitals/Pain Pain Assessment: Faces Faces Pain Scale: Hurts little more Pain Location: back Pain Descriptors / Indicators: Sore Pain Intervention(s): Monitored during session;Repositioned    Home Living Family/patient expects to be discharged to:: Private residence Living Arrangements: Spouse/significant other Available Help at Discharge: Family;Available PRN/intermittently Type of Home: House Home Access: Level entry     Home Layout: Two level;Able to live on main level with bedroom/bathroom Home Equipment: Kasandra Knudsen - single point      Prior Function Level of Independence: Independent         Comments: Plays golf up until recently due to SOB.     Hand Dominance        Extremity/Trunk Assessment   Upper Extremity Assessment: Defer to OT evaluation           Lower Extremity Assessment: Generalized weakness         Communication   Communication: No difficulties  Cognition Arousal/Alertness: Awake/alert Behavior During Therapy: Impulsive;WFL for tasks assessed/performed Overall Cognitive Status: Impaired/Different from baseline Area of Impairment: Safety/judgement;Orientation Orientation Level: Disoriented to;Time   Memory: Decreased short-term memory ("I don't remember  things like I used too.")   Safety/Judgement: Decreased awareness of safety;Decreased awareness of deficits          General Comments      Exercises        Assessment/Plan    PT Assessment Patient needs  continued PT services  PT Diagnosis Difficulty walking;Generalized weakness   PT Problem List Decreased strength;Cardiopulmonary status limiting activity;Pain;Decreased cognition;Decreased balance;Decreased mobility;Decreased activity tolerance;Decreased knowledge of use of DME  PT Treatment Interventions Balance training;Gait training;Functional mobility training;Therapeutic activities;Therapeutic exercise;Patient/family education;DME instruction   PT Goals (Current goals can be found in the Care Plan section) Acute Rehab PT Goals Patient Stated Goal: to go home and return to playing golf PT Goal Formulation: With patient Time For Goal Achievement: 03/09/15 Potential to Achieve Goals: Fair    Frequency Min 3X/week   Barriers to discharge Decreased caregiver support Pt reports wife works during the day    Co-evaluation               End of Session Equipment Utilized During Treatment: Gait belt Activity Tolerance: Patient limited by fatigue Patient left: in chair;with call bell/phone within reach Nurse Communication: Mobility status         Time: 6010-9323 PT Time Calculation (min) (ACUTE ONLY): 18 min   Charges:   PT Evaluation $Initial PT Evaluation Tier I: 1 Procedure     PT G Codes:        Teona Vargus A Laurren Lepkowski 02/23/2015, 2:34 PM Wray Kearns, [place], DPT [phone]

## 2015-02-23 NOTE — Progress Notes (Signed)
Attempted to call report to 2W at this time.  Ruben Reason

## 2015-02-23 NOTE — Progress Notes (Signed)
Report called to 2W; will transfer pt once bedrest is over at 1030; will cont. To monitor.  [name]

## 2015-02-23 NOTE — Progress Notes (Signed)
[address]       [place] [address]             [phone]        CARDIOTHORACIC SURGERY PROGRESS NOTE   R7 Days Post-Op Procedure(s) (LRB): TRANSCATHETER AORTIC VALVE REPLACEMENT, TRANSAPICAL (N/A) TRANSESOPHAGEAL ECHOCARDIOGRAM (TEE) (N/A)  Subjective: Looks and feels much better.  Denies pain, SOB.  Wants to take a shower and go for a walk  Objective: Vital signs: BP Readings from Last 1 Encounters:  02/23/15 89/56   Pulse Readings from Last 1 Encounters:  02/23/15 78   Resp Readings from Last 1 Encounters:  02/23/15 25   Temp Readings from Last 1 Encounters:  02/23/15 98.8 F (37.1 C) Oral    Hemodynamics:    Physical Exam:  Rhythm:   Afib w/ controlled rate  Breath sounds: clear  Heart sounds:  Irregular w/out murmur  Incisions:  Clean and dry  Abdomen:  Soft, non-distended, non-tender  Extremities:  Warm, well-perfused   Intake/Output from previous day: 10/24 0701 - 10/25 0700 In: 2993 [P.O.:1320; I.V.:3] Out: 4070 [Urine:900; Chest Tube:170] Intake/Output this shift:    Lab Results:  CBC: Recent Labs  02/22/15 0330 02/23/15 0538  WBC 8.7 9.9  HGB 9.5* 9.8*  HCT 30.1* 30.8*  PLT 67* 77*    BMET:  Recent Labs  02/22/15 0330 02/23/15 0538  NA 136 136  K 4.6 4.1  CL 103 97*  CO2 26 29  GLUCOSE 117* 102*  BUN 28* 29*  CREATININE 3.39* 3.26*  CALCIUM 8.7* 8.7*     PT/INR:   Recent Labs  02/23/15 0538  LABPROT 20.5*  INR 1.76*    CBG (last 3)  No results for input(s): GLUCAP in the last 72 hours.  ABG    Component Value Date/Time   PHART 7.520* 02/20/2015 0414   PCO2ART 32.0* 02/20/2015 0414   PO2ART 62.0* 02/20/2015 0414   HCO3 26.2* 02/20/2015 0414   TCO2 27 02/20/2015 0414   ACIDBASEDEF 2.0 02/17/2015 2204   O2SAT 94.0 02/20/2015 0414    CXR: PORTABLE CHEST 1 VIEW  COMPARISON: Portable chest x-ray of February 22, 2015  FINDINGS: There is a persistent tiny right apical  pneumothorax. The pleural line overlies the posterior aspect of the third rib. The right-sided chest tube is unchanged in position with its tip overlying the posterior aspect of the ninth rib. There is no large pleural effusion. The cardiac silhouette remains enlarged. The pulmonary vascularity is less engorged. The interstitial markings remain increased but have improved. There is mild subsegmental atelectasis partially obscuring the left heart border. The left internal jugular venous catheter tip projects at the junction of the right and left brachiocephalic veins.  IMPRESSION: 1. Persistent tiny right apical pneumothorax. The right-sided chest tube is unchanged in position. 2. Decreased pulmonary interstitial edema. Persistent left basilar subsegmental atelectasis.   Electronically Signed  By: David Martinique M.D.  On: 02/23/2015 07:44  Assessment/Plan: S/P Procedure(s) (LRB): TRANSCATHETER AORTIC VALVE REPLACEMENT, TRANSAPICAL (N/A) TRANSESOPHAGEAL ECHOCARDIOGRAM (TEE) (N/A)  Overall stable POD7 Chronic persistent Afib - good rate control on oral diltiazem and low dose metoprolol Stable BP off all drips Acute on chronic combined systolic and diastolic CHF - improved Expected post op volume excess, improved but weight still 3 kg above baseline CKD stage V on CVVHD - labs stable Chronic anemia - stable Post op thrombocytopenia, expected, platelet count up slightly 77k today Bilateral pleural effusions resolved w/ right chest tube in  place, output decreased  Continue low dose lopressor and diltiazem for rate control, can titrate metoprolol up if necessary  HD per Nephrology   Continue coumadin   Watch platelet count and hold ASA for now  D/C right chest tube and central line  Mobilize as much as possible - continue PT  Transfer stepdown  Reevaluate possible short term placement in SNF for rehab vs d/c home with home health PT  I spent in excess of 15  minutes during the conduct of this hospital encounter and >50% of this time involved direct face-to-face encounter with the patient for counseling and/or coordination of their care.   [name], MD 02/23/2015 7:56 AM

## 2015-02-23 NOTE — Progress Notes (Addendum)
[facility] Progress Note   Subjective:  Tolerated HD yesterday Net UF was 3 liters BP's are soft Transferred to floor bed Starting Cardiac Rehab  Filed Vitals:   02/23/15 0830 02/23/15 0900 02/23/15 1000 02/23/15 1100  BP:  113/58 87/49 95/54   Pulse: 72 83 85 87  Temp:    97.9 F (36.6 C)  TempSrc:    Oral  Resp: 16 19 20 19   Height:      Weight:      SpO2: 96% 99% 96% 94%   Exam: BP 95/54 mmHg  Pulse 87  Temp(Src) 97.9 F (36.6 C) (Oral)  Resp 19  Ht 5\' 8"  (1.727 m)  Wt 77.1 kg (169 lb 15.6 oz)  BMI 25.85 kg/m2  SpO2 94%  Alert, awake no jvd Left IJ 3L HD cath has been removed - dressing over site AFib rate controlled Bruit from AVF heard across chest Chest clear Ext No sig edema Neuro alert, Ox 3 LUA AVF +bruit  Weight trending (EDW 77 kg) 10/19 81.6 10/20 82.9 10/21 81.2 10/23 78.8  CRRT stopped 10/24 80.3   IHD 10/25 77.1 kg   ECHO normal LVEF 02/18/15  Dialysis prescription [facility] MWF   4h  77kg  3/2.0 bath  Hep 4000  LUA AVF Venofer 66mb q wed hd  Other op lab hgb 12.5 oct 14 , ca 9.2 phos 6.1 pth 213  Assessment/Plan 1. Severe AS =   SP TAVR/transapical approach 02/16/15.  2. Pulm edema/ vol excess - mostly resolved.  3. Hypotension - Off pressors (10/23). Pressures soft. 4. Afib - on po dilt and MTP, coumadin 5. ESRD - CRRT stopped 10/23. Transitioned back to IHD 10/25 and was well tolerated 6. Anemia - Hb 9.5. Not on ESA. Low TSat. Fe load with HD X 10 doses to start 10/26  7. Thrombocytopenia - felt post op related. Improving. 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 Phos low 2.8 Reduce Renvela.2->1 with meals 9. Hx CAD/ CM 10. Disposition - ? SNF vs home  Jamal Maes, MD [facility] [phone] Pager 02/23/2015, 12:37 PM   Additional Objective Data 10/24 CXR - stable chest tubes. Vascular congestion without frank edema. Small left effusion. Prosthetic aortic valve.  Recent Labs Lab 02/20/15 1624 02/21/15 0403  02/22/15 0330 02/23/15 0538  NA 133* 132* 136 136  K 5.0 4.7 4.6 4.1  CL 101 99* 103 97*  CO2 28 28 26 29   GLUCOSE 105* 101* 117* 102*  BUN 17 16 28* 29*  CREATININE 2.32* 2.15* 3.39* 3.26*  CALCIUM 8.2* 8.3* 8.7* 8.7*  PHOS 2.3* 2.0*  --  2.8    Recent Labs Lab 02/20/15 0402 02/20/15 1624 02/21/15 0403 02/23/15 0538  AST 133*  --   --   --   ALT 245*  --   --   --   ALKPHOS 60  --   --   --   BILITOT 0.8  --   --   --   PROT 5.0*  --   --   --   ALBUMIN 2.6* 2.5* 2.5* 2.2*    Recent Labs Lab 02/21/15 0403 02/22/15 0330 02/23/15 0538  WBC 7.6 8.7 9.9  HGB 10.1* 9.5* 9.8*  HCT 31.8* 30.1* 30.8*  MCV 108.9* 109.5* 109.2*  PLT 58* 67* 77*    Results for VAN MARCELINO, CAMPOS (MRN [id number]) as of 02/23/2015 12:40  Ref. Range 02/23/2015 05:38  Iron Latest Ref Range: 45-182 ug/dL 27 (L)  UIBC Latest Units: ug/dL 233  TIBC Latest Ref Range: 250-450 ug/dL 260  Saturation Ratios Latest Ref Range: 17.9-39.5 % 10 (L)   Scheduled Medications . antiseptic oral rinse  7 mL Mouth Rinse BID  . calcitRIOL  0.25 mcg Oral Daily  . diltiazem  180 mg Oral QHS  . lipase/protease/amylase  48,000 Units Oral BID WC  . metoprolol tartrate  12.5 mg Oral BID  . multivitamin  1 tablet Oral QHS  . pantoprazole  40 mg Oral Daily  . sevelamer carbonate  1,600 mg Oral TID WC  . sodium chloride  10-40 mL Intracatheter Q12H  . sodium chloride  3 mL Intravenous Q12H  . warfarin  5 mg Oral q1800  . Warfarin - Physician Dosing Inpatient   Does not apply q1800   Infusions: . sodium chloride 10 mL/hr (02/21/15 2112)   sodium chloride, sodium chloride, sodium chloride, sodium chloride, alteplase, diphenhydrAMINE, fentaNYL (SUBLIMAZE) injection, heparin, heparin, hydrOXYzine, Influenza vac split quadrivalent PF, lidocaine (PF), lidocaine-prilocaine, metoprolol, ondansetron (ZOFRAN) IV, oxyCODONE, pentafluoroprop-tetrafluoroeth, sodium chloride, sodium chloride

## 2015-02-24 ENCOUNTER — Inpatient Hospital Stay (HOSPITAL_COMMUNITY): Payer: MEDICARE

## 2015-02-24 LAB — CBC
HCT: 29.1 % — ABNORMAL LOW (ref 39.0–52.0)
HEMOGLOBIN: 9.4 g/dL — AB (ref 13.0–17.0)
MCH: 34.8 pg — AB (ref 26.0–34.0)
MCHC: 32.3 g/dL (ref 30.0–36.0)
MCV: 107.8 fL — ABNORMAL HIGH (ref 78.0–100.0)
PLATELETS: 102 10*3/uL — AB (ref 150–400)
RBC: 2.7 MIL/uL — ABNORMAL LOW (ref 4.22–5.81)
RDW: 16.5 % — AB (ref 11.5–15.5)
WBC: 11.5 10*3/uL — ABNORMAL HIGH (ref 4.0–10.5)

## 2015-02-24 LAB — RENAL FUNCTION PANEL
ALBUMIN: 2.3 g/dL — AB (ref 3.5–5.0)
Anion gap: 13 (ref 5–15)
BUN: 48 mg/dL — AB (ref 6–20)
CALCIUM: 9 mg/dL (ref 8.9–10.3)
CO2: 25 mmol/L (ref 22–32)
CREATININE: 4.59 mg/dL — AB (ref 0.61–1.24)
Chloride: 95 mmol/L — ABNORMAL LOW (ref 101–111)
GFR calc Af Amer: 13 mL/min — ABNORMAL LOW (ref >60)
GFR calc non Af Amer: 11 mL/min — ABNORMAL LOW (ref >60)
GLUCOSE: 104 mg/dL — AB (ref 65–99)
PHOSPHORUS: 4 mg/dL (ref 2.5–4.6)
Potassium: 4.7 mmol/L (ref 3.5–5.1)
SODIUM: 133 mmol/L — AB (ref 135–145)

## 2015-02-24 LAB — GLUCOSE, CAPILLARY: Glucose-Capillary: 78 mg/dL (ref 65–99)

## 2015-02-24 LAB — PROTIME-INR
INR: 2 — AB (ref 0.00–1.49)
PROTHROMBIN TIME: 22.5 s — AB (ref 11.6–15.2)

## 2015-02-24 MED ORDER — LOPERAMIDE HCL 2 MG PO CAPS: 2.0000 mg | ORAL_CAPSULE | Freq: Two times a day (BID) | ORAL | Status: DC | PRN

## 2015-02-24 MED ORDER — ALBUMIN HUMAN 25 % IV SOLN
INTRAVENOUS | Status: AC
  Administered 2015-02-24: 25 g via INTRAVENOUS
  Filled 2015-02-24: qty 100

## 2015-02-24 MED ORDER — LOPERAMIDE HCL 2 MG PO CAPS
2.0000 mg | ORAL_CAPSULE | Freq: Two times a day (BID) | ORAL | Status: DC
  Administered 2015-02-24 – 2015-03-01 (×10): 2 mg via ORAL
  Filled 2015-02-24 (×11): qty 1

## 2015-02-24 MED ORDER — ALBUMIN HUMAN 25 % IV SOLN
25.0000 g | INTRAVENOUS | Status: DC | PRN
  Administered 2015-02-24: 25 g via INTRAVENOUS

## 2015-02-24 MED ORDER — ACETAMINOPHEN 325 MG PO TABS
650.0000 mg | ORAL_TABLET | Freq: Four times a day (QID) | ORAL | Status: DC | PRN
  Administered 2015-02-28: 650 mg via ORAL
  Filled 2015-02-24: qty 2

## 2015-02-24 MED ORDER — HEPARIN 1000 UNIT/ML FOR PERITONEAL DIALYSIS
800.0000 [IU] | INTRAMUSCULAR | Status: DC | PRN
  Administered 2015-02-24: 800 [IU] via INTRAVENOUS_CENTRAL
  Filled 2015-02-24: qty 0.8

## 2015-02-24 MED ORDER — ASPIRIN EC 81 MG PO TBEC
81.0000 mg | DELAYED_RELEASE_TABLET | Freq: Every day | ORAL | Status: DC
  Administered 2015-02-24 – 2015-03-01 (×4): 81 mg via ORAL
  Filled 2015-02-24 (×6): qty 1

## 2015-02-24 NOTE — Progress Notes (Signed)
CARDIAC REHAB PHASE I   PRE:  Rate/Rhythm: 83 afib  BP:  Supine:   Sitting: 96/76  Standing:    SaO2: 92% 2L, 88-91% RA but not consistent  MODE:  Ambulation: 350 ft   POST:  Rate/Rhythm: 107 afib  BP:  Supine:   Sitting: 115/77  Standing:    SaO2: 91-92% 2L left hand 1005-1040 Pt did not want me to hold to him during walk. Encouraged him to stay close to walker as he had tendency to get it too far out. C/o feeling a little lightheaded and legs weak during walk. Walked 350 ft on 2L with rolling walker and I had hand under arm in case he became unsteady. To recliner after walk. Encouraged IS and pt able to get about 2000 ml. Wife in room.   Graylon Good, RN BSN  02/24/2015 10:37 AM

## 2015-02-24 NOTE — Progress Notes (Signed)
PT Cancellation Note  Patient Details Name: [name] MRN: [id number] DOB: 08/05/1940   Cancelled Treatment:    Reason Eval/Treat Not Completed: Patient at procedure or test/unavailable Pt off floor at HD. Will follow up next available time.   [name] 02/24/2015, 12:20 PM  Wray Kearns, [place], DPT [phone]

## 2015-02-24 NOTE — Progress Notes (Signed)
Pt is alert, answers orientation questions appropriately but will suddenly switch topics and talk about things that do not make sense (pt stated that he was about to get dressed to go meet with realtors to discuss buying a few of the hospital units as investment properties), pt very impulsive about getting dressed and leaving, RN attempted to reorient pt, pt decided to call wife at 0430 to discuss plan with realtors, pt wife able to reorient pt, pt now back in bed with call bell in reach, bed in lowest position and bed alarm in use, RN will continue to monitor.   Izola Price, RN  02/24/2015

## 2015-02-24 NOTE — Discharge Summary (Signed)
Physician Discharge Summary       [address]       [place] [address]             [phone]    Patient ID: [name] MRN: [id number] DOB/AGE: 75-Nov-1942 75 y.o.  Admit date: 02/16/2015 Discharge date: 03/01/2015  Admission Diagnoses: 1. Severe aortic stenosis 2. History of essential hypertension 3. History of of HOCM (hypertrophic obstructive cardiomyopathy) 4. History of chronic diastolic CHF, class 3 5. History of chronic kidney disease, stage V ([place]) 6. History of chronic atrial fibrillation (HCC) 7. History of NSVT 8. History of PVD 9. History of chronic diarrhea 10. History of remote tobacco abuse  Discharge Diagnoses:  1. Severe aortic stenosis 2. History of essential hypertension 3. History of of HOCM (hypertrophic obstructive cardiomyopathy) 4. History of chronic diastolic CHF, class 3 5. History of chronic kidney disease, stage V ([place]) 6. History of chronic atrial fibrillation (HCC) 7. Right pleural effusion 8. History of NSVT 9. History of PVD 10. History of chronic diarrhea 11. History of remote tobacco abuse 12. Post op delirium  Consults: nephrology  Procedure (s):  Right chest tube placement by Dr. Roxy Manns on 02/19/2015.  Transcatheter Aortic Valve Replacement - Transapical Approach Edwards Sapien XT THV (size 29 mm, model # 9300TFX, serial # [id number]) by Dr. Roxy Manns on 02/16/2015.  History of Presenting Illness: This is a 74 year old male with history of aortic stenosis, hypertension, stage V chronic kidney disease on hemodialysis, chronic persistent atrial fibrillation with history of thrombus and the left atrial appendage on warfarin anticoagulation,chronic diastolic congestive heart failure, and orthostatic hypotension who returns to the office with tentative plans to proceed with transcatheter aortic valve replacement via transapical approach on Tuesday, 02/16/2015. The patient was originally seen in consultation  on 12/15/2014 at which time the patient was hospitalized for an acute exacerbation of chronic diastolic congestive heart failure. He was evaluated by a multidisciplinary team of specialists during that hospitalization and diagnosed with stage D severe symptomatic aortic stenosis. He is felt to be a poor candidate for conventional surgical aortic valve replacement because of his advanced age and numerous comorbid medical problems including dialysis-dependent renal failure. In addition, the patient has moderate to severe calcification of the entire ascending thoracic aorta. Plans were made for transcatheter aortic valve replacement as a far less invasive and potentially less risky alternative to extreme risk conventional surgery. During his workup, he was found to have thrombus in the left atrial appendage. The patient was subsequently anticoagulated using warfarin and plans for TAVR were postponed. The patient has been maintained on warfarin ever since and was seen recently in follow-up by Dr. Burt Knack. The patient returns to the office today with hopes to proceed with TAVR next week. He reports no new problems or complaints over the last few weeks. He continues to experience exertional shortness of breath and fatigue with very low level physical activity, consistent with chronic diastolic congestive heart failure [place] Heart Association functional class III. He denies any symptoms of resting shortness of breath, PND, orthopnea, dizzy spells, or syncope. His breathing has been somewhat improved and chest tightness has resolved since he began dialysis therapy. He has not been having any particular problems with dialysis treatments lately. The remainder of his review of systems is unchanged from previously.      There is severe calcification with thickening and restricted leaflet mobility involving all 3 leaflets of the aortic valve. Peak velocity across the aortic valve  measured greater than 4 m/s corresponding  to mean transvalvular gradient estimated 44 mmHg. There is at least mild to moderate aortic insufficiency. There also appears to be bulky calcification involving the mitral valve including both the anterior and posterior annulus. This calcification appears to extend into the left ventricular outflow tract and to the aortic valve annulus through the intervalvular fibrosa. There is moderate mitral regurgitation. Left ventricular systolic function remains reasonably well preserved with ejection fraction estimated 55%. Diagnostic cardiac catheterization is notable for the absence of significant coronary artery disease but also notable for the presence of extensive calcification throughout the aortic root, mitral annulus, left ventricular outflow tract, and ascending thoracic aorta. Risks associated with conventional surgical aortic valve replacement would unquestionably be extremely high, and Dr. Roxy Manns did not feel that the patient should be considered a candidate for open surgical aortic valve replacement. Cardiac gated CT angiogram of the heart reveals anatomical features acceptable for transcatheter aortic valve replacement. However, the presence of significant calcification in the aortic root and left ventricular outflow tract might increase the risk of paravalvular leak. Unfortunately, CT angiogram of the chest, abdomen, and pelvis demonstrates that the patient does not have adequate pelvic vascular access for a transfemoral approach. He has significant disease in the transverse aortic arch. Because of this and the presence of left upper arm AV fistula for dialysis access, use of the left subclavian approach probably should be avoided. The risks associated with conventional surgical aortic valve replacement were been discussed in detail, as were expectations for post-operative convalescence. Long-term prognosis with medical therapy was discussed. This discussion was placed in the context of the patient's  own specific clinical presentation and past medical history. All of their questions been addressed. He was admitted on 02/16/2015 in order to undergo transcatheter aortic valve replacement via transapical approach.  Brief Hospital Course:  The patient was extubated the afternoon of surgery without difficulty. He remained afebrile and hemodynamically stable. He was weaned off of Milrinone, Neo synephrine, and Levophed drips. He remained in a fib with a controlled rate. He was on IV Cardizem. Gordy Councilman, a line, ,and foley were removed early in the post operative course. Central line and chest tubes did remain for several days post op and then were removed. Lopressor was started.  Nephrology continued to follow him post op and arranged for HD. He was started on Coumadin and his PT and INR were monitored daily. His last INR was 2.96.  His INR should be between 2-2.5. Per Dr. Roxy Manns, he will be discharged to SNF on Coumadin 2 mg daily. He had chronic anemia. His last H and H was . 9.9 and 30.8.He was weaned off the insulin drip. The patient's glucose remained well controlled. The patient's HGA1C pre op was 5.9.  Echo done 10/20 showed LVEF 55-60%, mild AI, no other significant valvular abnormalities. He did develop a right pleural effusion and Dr. Roxy Manns placed a right chest tube on 10/21. It was removed on 10/25 . He has had post op delirium and narcotics have been stopped. His mentation is slowly improving.The patient was felt surgically stable for transfer from the ICU to PCTU for further convalescence on 02/23/2015. He continues to progress with cardiac rehab. He was ambulating on 2 liters of oxygen. He was weaned to room air. He has been tolerating a diet and has had a bowel movement. Epicardial pacing wires have been removed. Chest tube sutures will be removed prior to discharge. The patient is felt surgically stable  for discharge to SNF today.  We ask the SNF to please do the following: 1. Please obtain vital  signs at least one time daily 2.Please weigh the patient daily. If he or she continues to gain weight or develops lower extremity edema, contact the office at [phone]. 3. Ambulate patient at least three times daily and please use sternal precautions.  Latest Vital Signs: Blood pressure 142/68, pulse 110, temperature 97.3 F (36.3 C), temperature source Oral, resp. rate 20, height 5\' 8"  (1.727 m), weight 167 lb 15.9 oz (76.2 kg), SpO2 96 %.  Physical Exam: Cardiovascular: IRRR IRRR  Pulmonary: Slightly diminished at bases Abdomen: Soft, non tender, bowel sounds present. Extremities:Bilateral lower extremity edema (mid calf down) Wound: Clean and dry. No erythema or signs of infection. Neurologic: AAOx3, grossly intact without focal deficits  Discharge Condition: Stable and discharged to SNF  Recent laboratory studies:  Lab Results  Component Value Date   WBC 13.4* 03/01/2015   HGB 9.9* 03/01/2015   HCT 30.8* 03/01/2015   MCV 106.9* 03/01/2015   PLT 254 03/01/2015   Lab Results  Component Value Date   NA 135 03/01/2015   K 4.1 03/01/2015   CL 97* 03/01/2015   CO2 25 03/01/2015   CREATININE 5.41* 03/01/2015   GLUCOSE 103* 03/01/2015    Diagnostic Studies: Dg Chest 2 View 02/28/2015  EXAM: CHEST - 2 VIEW  COMPARISON: 02/24/2015  FINDINGS: Stable mild cardiomegaly. Atheromatous tortuous thoracic aorta. Changes of AVR. Small bilateral pleural effusions stable. Mild interstitial edema or infiltrates predominately in the lung bases, slightly increased. No pneumothorax. Spurring in the lower thoracic spine.  IMPRESSION: 1. Slight increase in bilateral interstitial edema or infiltrates. 2. Stable cardiomegaly and tiny effusions.   Electronically Signed  By: Lucrezia Europe M.D.  On: 02/28/2015 08:13       Discharge Instructions    Amb Referral to Cardiac Rehabilitation    Complete by:  As directed   Diagnosis:  Valve Replacement/Repair           Discharge Medications:   Medication List    STOP taking these medications        calcitRIOL 0.25 MCG capsule  Commonly known as:  ROCALTROL     enoxaparin 60 MG/0.6ML injection  Commonly known as:  LOVENOX      TAKE these medications        acetaminophen 500 MG tablet  Commonly known as:  TYLENOL  Take 1,000 mg by mouth every 6 (six) hours as needed for mild pain or moderate pain.     ARTIFICIAL TEARS OP  Place 1 drop into both eyes daily as needed (rinse eyes).     aspirin 81 MG EC tablet  Take 1 tablet (81 mg total) by mouth daily.     CENTRUM SILVER ULTRA MENS PO  Take 1 tablet by mouth daily. Take daily     CREON 24000 UNITS Cpep  Generic drug:  Pancrelipase (Lip-Prot-Amyl)  Take 48,000 Units by mouth 2 (two) times daily with a meal.     diltiazem 180 MG 24 hr capsule  Commonly known as:  CARDIZEM CD  Take 180 mg by mouth at bedtime.     ferric gluconate 125 mg in sodium chloride 0.9 % 100 mL  Inject 125 mg into the vein every Monday, Wednesday, and Friday with hemodialysis.     IMODIUM PO  Take 1 tablet by mouth 2 (two) times daily.     metoprolol tartrate 25 MG tablet  Commonly  known as:  LOPRESSOR  Take 0.5 tablets (12.5 mg total) by mouth 2 (two) times daily.     sevelamer carbonate 800 MG tablet  Commonly known as:  RENVELA  1,600 mg 3 (three) times daily with meals.     warfarin 2 MG tablet  Commonly known as:  COUMADIN  Take 1 tablet (2 mg total) by mouth daily. Or take as directed by coumadin clinic       The patient has been discharged on:   1.Beta Blocker:  Yes [ x  ]                              No   [   ]                              If No, reason:  2.Ace Inhibitor/ARB: Yes [   ]                                     No  [  x  ]                                     If No, reason:ESRD  3.Statin:   Yes [   ]                  No  [ x  ]                  If No, reason:No CAD  4.Shela [place]:  Yes  [ x  ]                  No   [   ]                   If No, reason:  Follow Up Appointments: Follow-up Information    Follow up with [name], MD On 03/08/2015.   Specialty:  Cardiothoracic Surgery   Why:  Appointment is with physician assistant and appointment time is at 3:00 pm   Contact information:   [address] [place] [address] [phone]       Follow up with [name], MD On 03/23/2015.   Specialty:  Cardiothoracic Surgery   Why:  Echo to be done prior to office appointment at 2:00 pm. Appointment time is at 4:00 pm   Contact information:   [place] [address] [phone]       Follow up with SNF.   Why:  Please draw PT and INR (as in on Coumadin) on Wednesday 03/03/2015 and call or fax results to Dr. Antionette Char office      Follow up with Sueanne Margarita, MD On 03/11/2015.   Specialty:  Cardiology   Why:  Appointment time is 10:00 am   Contact information:   [address] [place] [address] [phone]       Signed: Lars Pinks MPA-C 03/01/2015, 1:26 PM

## 2015-02-24 NOTE — NC FL2 (Signed)
[place] LEVEL OF CARE SCREENING TOOL     IDENTIFICATION  Patient Name: [name] Birthdate: January 14, 1941 Sex: male Admission Date (Current Location): 02/16/2015  [facility] and [place] Number: Herbalist and Address:  [facility] [place], [place] [address] [place], [place] [address]      Provider Number: [id number]  Attending Physician Name and Address:  [name], MD  Relative Name and Phone Number:       Current Level of Care: Hospital Recommended Level of Care: [place] Prior Approval Number:    Date Approved/Denied:   PASRR Number:    Discharge Plan: SNF    Current Diagnoses: Patient Active Problem List   Diagnosis Date Noted  . S/P TAVR (transcatheter aortic valve replacement) 02/16/2015  . Severe aortic stenosis 12/30/2014  . Bilateral edema of lower extremity   . Chronic anticoagulation 12/16/2014  . Acute on chronic diastolic heart failure ([place]) 12/10/2014  . Dyspnea 07/09/2014  . Chronic atrial fibrillation ([place]) 06/19/2014  . Metabolic acidosis   . Pleural effusion   . Junctional bradycardia   . Chronic kidney disease, stage V ([place]) 03/30/2014  . Ventricular tachycardia (paroxysmal) ([place]) 02/26/2014  . Chronic diastolic CHF, class 3 46/80/3212  . Bradycardia 05/26/2013  . Diastolic dysfunction   . Orthostatic hypotension 05/02/2013  . Aortic stenosis, severe 05/02/2013  . PULMONARY FUNCTION TESTS, ABNORMAL 02/05/2008  . BLADDER CANCER 02/04/2008  . Essential hypertension 02/04/2008  . HOCM (hypertrophic obstructive cardiomyopathy) ([place]) 02/04/2008    Orientation ACTIVITIES/SOCIAL BLADDER RESPIRATION   (AOx4)   Continent O2 (As needed)  BEHAVIORAL SYMPTOMS/MOOD NEUROLOGICAL BOWEL NUTRITION STATUS      Continent Diet (cardiac)  PHYSICIAN VISITS COMMUNICATION OF NEEDS Height & Weight Skin    Verbally 5\' 8"  (172.7 cm) 175 lbs. Surgical wounds          AMBULATORY STATUS RESPIRATION     Supervision limited O2 (As needed)      Personal Care Assistance Level of Assistance  Bathing, Dressing Bathing Assistance: Limited assistance   Dressing Assistance: Limited assistance      Functional Limitations Info                [place]  PT (By licensed PT)     PT Frequency: 5xweek             Additional Factors Info  Allergies   Allergies Info: amiodarone, oxycodone           Current Medications (02/24/2015): Current Facility-Administered Medications  Medication Dose Route Frequency Provider Last Rate Last Dose  . 0.9 %  sodium chloride infusion  250 mL Intravenous PRN [name], MD      . 0.9 %  sodium chloride infusion   Intravenous Continuous Estanislado Emms, MD 10 mL/hr at 02/21/15 2112 10 mL/hr at 02/21/15 2112  . 0.9 %  sodium chloride infusion  100 mL Intravenous PRN [name], MD      . 0.9 %  sodium chloride infusion  100 mL Intravenous PRN [name], MD      . 0.9 %  sodium chloride infusion  250 mL Intravenous PRN [name], MD      . acetaminophen (TYLENOL) tablet 650 mg  650 mg Oral Q6H PRN Donielle Liston Alba, PA-C      . alteplase (CATHFLO ACTIVASE) injection 2 mg  2 mg Intracatheter Once PRN [name], MD      .  antiseptic oral rinse (CPC / CETYLPYRIDINIUM CHLORIDE 0.05%) solution 7 mL  7 mL Mouth Rinse BID [name], MD   7 mL at 02/23/15 1000  . aspirin EC tablet 81 mg  81 mg Oral Daily [name], MD   81 mg at 02/24/15 1058  . calcitRIOL (ROCALTROL) capsule 0.25 mcg  0.25 mcg Oral Daily [name], MD   0.25 mcg at 02/24/15 1058  . diltiazem (CARDIZEM CD) 24 hr capsule 180 mg  180 mg Oral QHS [name], MD   180 mg at 02/23/15 2157  . diphenhydrAMINE (BENADRYL) injection 12.5 mg  12.5 mg Intravenous BID PRN [name], MD   12.5 mg at 02/21/15 2028  . ferric gluconate (NULECIT) 125 mg in sodium chloride 0.9 % 100 mL IVPB  125 mg Intravenous Q M,W,F-HD Jamal Maes, MD      . heparin injection 1,000 Units  1,000 Units Dialysis PRN [name], MD      . heparin injection 1,600 Units  1,600 Units Dialysis PRN [name], MD      . hydrOXYzine (ATARAX/VISTARIL) tablet 10 mg  10 mg Oral TID PRN Melrose Nakayama, MD   10 mg at 02/19/15 2139  . Influenza vac split quadrivalent PF (FLUARIX) injection 0.5 mL  0.5 mL Intramuscular Prior to discharge [name], MD      . lidocaine (PF) (XYLOCAINE) 1 % injection 5 mL  5 mL Intradermal PRN [name], MD      . lidocaine-prilocaine (EMLA) cream 1 application  1 application Topical PRN [name], MD      . lipase/protease/amylase (CREON) capsule 48,000 Units  48,000 Units Oral BID WC [name], MD   48,000 Units at 02/24/15 0940  . loperamide (IMODIUM) capsule 2 mg  2 mg Oral BID Donielle M Zimmerman, PA-C      . metoprolol (LOPRESSOR) injection 2.5-5 mg  2.5-5 mg Intravenous Q2H PRN [name], MD   2.5 mg at 02/16/15 2124  . metoprolol tartrate (LOPRESSOR) tablet 12.5 mg  12.5 mg Oral BID [name], MD   12.5 mg at 02/23/15 2156  . multivitamin (RENA-VIT) tablet 1 tablet  1 tablet Oral QHS [name], MD   1 tablet at 02/23/15 2156  . ondansetron (ZOFRAN) injection 4 mg  4 mg Intravenous Q6H PRN [name], MD   4 mg at 02/16/15 1140  . pantoprazole (PROTONIX) EC tablet 40 mg  40 mg Oral Daily [name], MD   40 mg at 02/24/15 1058  . pentafluoroprop-tetrafluoroeth (GEBAUERS) aerosol 1 application  1 application Topical PRN [name], MD      . sevelamer carbonate (RENVELA) tablet 800 mg  800 mg Oral TID WC Jamal Maes, MD   800 mg at 02/24/15 0940  . sodium chloride 0.9 % injection 10-40 mL  10-40 mL Intracatheter Q12H [name], MD   10 mL at 02/22/15 2138  . sodium chloride 0.9 % injection 10-40 mL  10-40 mL Intracatheter PRN [name], MD      . sodium chloride 0.9 % injection 3 mL  3 mL Intravenous Q12H [name], MD   3 mL  at 02/23/15 2205  . sodium chloride 0.9 % injection 3 mL  3 mL Intravenous PRN [name], MD      . warfarin (COUMADIN) tablet 5 mg  5 mg Oral q1800 [name], MD   5 mg  at 02/23/15 1827  . Warfarin - Physician Dosing Inpatient   Does not apply [place], [place]       Do not use this list as official medication orders. Please verify with discharge summary.  Discharge Medications:   Medication List    ASK your doctor about these medications        acetaminophen 500 MG tablet  Commonly known as:  TYLENOL  Take 1,000 mg by mouth every 6 (six) hours as needed for mild pain or moderate pain.     ARTIFICIAL TEARS OP  Place 1 drop into both eyes daily as needed (rinse eyes).     calcitRIOL 0.25 MCG capsule  Commonly known as:  ROCALTROL  Take 0.25 mcg by mouth as needed.     CENTRUM SILVER ULTRA MENS PO  Take 1 tablet by mouth daily. Take daily     CREON 24000 UNITS Cpep  Generic drug:  Pancrelipase (Lip-Prot-Amyl)  Take 48,000 Units by mouth 2 (two) times daily with a meal.     diltiazem 180 MG 24 hr capsule  Commonly known as:  CARDIZEM CD  Take 180 mg by mouth at bedtime.     enoxaparin 60 MG/0.6ML injection  Commonly known as:  LOVENOX  Inject 0.6 mLs (60 mg total) into the skin daily.     IMODIUM PO  Take 1 tablet by mouth 2 (two) times daily.     metoprolol tartrate 25 MG tablet  Commonly known as:  LOPRESSOR  Take 75 mg by mouth every evening.     sevelamer carbonate 800 MG tablet  Commonly known as:  RENVELA  1,600 mg 3 (three) times daily with meals.     warfarin 5 MG tablet  Commonly known as:  COUMADIN  Take 0.5 tablets (2.5 mg total) by mouth daily. Take as directed by coumadin clinic        Relevant Imaging Results:  Relevant Lab Results:  Recent Labs    Additional Information    Cranford Mon, LCSW

## 2015-02-24 NOTE — Progress Notes (Signed)
[facility] Progress Note   Subjective:  For hemo today Events of the PM/early AM noted Dr. Roxy Manns feels this is staill waxing/waning post op delerium Wife feels it's getting worse - not better...she has requested that he receive no more narcotics He's still focused on "getting that property" (he recalls calling his wife at 4:30 in the AM as well) Starting to have issues with loose stools - takes imodium chronically for this at home per wife  Filed Vitals:   02/23/15 1000 02/23/15 1100 02/23/15 2156 02/24/15 0531  BP: 87/49 95/54 100/50   Pulse: 85 87 99   Temp:  97.9 F (36.6 C)    TempSrc:  Oral    Resp: 20 19    Height:      Weight:    79.5 kg (175 lb 4.3 oz)  SpO2: 96% 94% 97%    Exam: BP 100/50 mmHg  Pulse 99  Temp(Src) 97.9 F (36.6 C) (Oral)  Resp 19  Ht 5\' 8"  (1.727 m)  Wt 79.5 kg (175 lb 4.3 oz)  BMI 26.66 kg/m2  SpO2 97%  Alert, awake, pale appearing no jvd AFib rate HR 90's Bruit from AVF heard across chest Chest clear Ext No sig edema of LE's Neuro alert, oriented to person, place, but maintains he has "property to buy" LUA AVF +bruit  Weight trending (EDW 77 kg) 10/19 81.6 10/20 82.9 10/21 81.2 10/23 78.8  CRRT stopped 10/24 80.3   IHD 10/25 77.1 kg  10/26 79.5 kg  ECHO normal LVEF 02/18/15  Dialysis prescription [facility] MWF   4h  77kg  3/2.0 bath  Hep 4000  LUA AVF Venofer 41mb q wed hd  Other op lab hgb 12.5 oct 14 , ca 9.2 phos 6.1 pth 213  Assessment/Plan 1. Severe AS =   SP TAVR/transapical approach 02/16/15.  2. Pulm edema/ vol excess - mostly resolved. Still some interstitial edema on today's CXR but O2Sats good 3. Post procedure delerium - issues with confusion during the night - persistent today. Narcotics stopped per wife. 4. Hypotension - Off pressors (10/23). Pressures soft. Remains on dilt 180 and BID metoprolol - HOPE we don't have to back off on either but these pressures will make fluid removal  problematic 5. Afib - on po dilt and MTP, coumadin 6. ESRD - CRRT stopped 10/23. Transitioned back to IHD 10/25 and was well tolerated. For HD again today. 7. Anemia - Hb 9.4. Not on ESA. Low TSat. Fe load with HD X 10 doses to start today with HD.  8. Thrombocytopenia - felt post op related. Improving. 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 Phos low 2.8 Reduced Renvela.2->1 with meals. Today 4 - good.  Pt is on daily calcitriol.Marland Kitchenibuprofen need to investigate where that comes from. (Normally would recieve at the dialysis unit TIW) 10. Hx CAD/ CM 11. Disposition - ? SNF vs home once delirium clears?  Jamal Maes, MD [facility] [phone] Pager 02/24/2015, 9:47 AM   Additional Objective Data  10/24 CXR - stable chest tubes. Vascular congestion without frank edema. Small left effusion. Prosthetic aortic valve. 02/24/15 CXR    COMPARISON: 02/23/2015 Cardiac shadow is enlarged but stable. Stent based aortic valve replacement is again seen. Mild vascular congestion and interstitial changes are noted consistent with CHF. The right chest tube is been removed in the interval. A tiny right pneumothorax remains in the apex but stable. No new focal infiltrate is seen. IMPRESSION: Tiny right pneumothorax is noted.  Changes of congestive failure Electronically  Signed  By: Inez Catalina M.D   On: 02/24/2015 08:00   Recent Labs Lab 02/21/15 0403 02/22/15 0330 02/23/15 0538 02/24/15 0224  NA 132* 136 136 133*  K 4.7 4.6 4.1 4.7  CL 99* 103 97* 95*  CO2 28 26 29 25   GLUCOSE 101* 117* 102* 104*  BUN 16 28* 29* 48*  CREATININE 2.15* 3.39* 3.26* 4.59*  CALCIUM 8.3* 8.7* 8.7* 9.0  PHOS 2.0*  --  2.8 4.0    Recent Labs Lab 02/20/15 0402  02/21/15 0403 02/23/15 0538 02/24/15 0224  AST 133*  --   --   --   --   ALT 245*  --   --   --   --   ALKPHOS 60  --   --   --   --   BILITOT 0.8  --   --   --   --   PROT 5.0*  --   --   --   --   ALBUMIN 2.6*  < > 2.5* 2.2* 2.3*  < > = values in this  interval not displayed.  Recent Labs Lab 02/22/15 0330 02/23/15 0538 02/24/15 0224  WBC 8.7 9.9 11.5*  HGB 9.5* 9.8* 9.4*  HCT 30.1* 30.8* 29.1*  MCV 109.5* 109.2* 107.8*  PLT 67* 77* 102*    Results for VAN BINNIE, VONDERHAAR (MRN [id number]) as of 02/23/2015 12:40  Ref. Range 02/23/2015 05:38  Iron Latest Ref Range: 45-182 ug/dL 27 (L)  UIBC Latest Units: ug/dL 233  TIBC Latest Ref Range: 250-450 ug/dL 260  Saturation Ratios Latest Ref Range: 17.9-39.5 % 10 (L)     Scheduled Medications . antiseptic oral rinse  7 mL Mouth Rinse BID  . aspirin EC  81 mg Oral Daily  . calcitRIOL  0.25 mcg Oral Daily  . diltiazem  180 mg Oral QHS  . ferric gluconate (FERRLECIT/NULECIT) IV  125 mg Intravenous Q M,W,F-HD  . lipase/protease/amylase  48,000 Units Oral BID WC  . metoprolol tartrate  12.5 mg Oral BID  . multivitamin  1 tablet Oral QHS  . pantoprazole  40 mg Oral Daily  . sevelamer carbonate  800 mg Oral TID WC  . sodium chloride  10-40 mL Intracatheter Q12H  . sodium chloride  3 mL Intravenous Q12H  . warfarin  5 mg Oral q1800  . Warfarin - Physician Dosing Inpatient   Does not apply q1800   Infusions: . sodium chloride 10 mL/hr (02/21/15 2112)   sodium chloride, sodium chloride, sodium chloride, sodium chloride, acetaminophen, alteplase, diphenhydrAMINE, heparin, heparin, hydrOXYzine, Influenza vac split quadrivalent PF, lidocaine (PF), lidocaine-prilocaine, loperamide, metoprolol, ondansetron (ZOFRAN) IV, pentafluoroprop-tetrafluoroeth, sodium chloride, sodium chloride

## 2015-02-24 NOTE — Progress Notes (Addendum)
      [address]       [place] [address]             [phone]        8 Days Post-Op Procedure(s) (LRB): TRANSCATHETER AORTIC VALVE REPLACEMENT, TRANSAPICAL (N/A) TRANSESOPHAGEAL ECHOCARDIOGRAM (TEE) (N/A)  Subjective: Events last evening noted. Wife is very concerned about his mental status.   Objective: Vital signs in last 24 hours: Temp:  [97.8 F (36.6 C)-97.9 F (36.6 C)] 97.9 F (36.6 C) (10/25 1100) Pulse Rate:  [72-99] 99 (10/25 2156) Cardiac Rhythm:  [-] Atrial fibrillation (10/25 1929) Resp:  [15-20] 19 (10/25 1100) BP: (87-113)/(49-69) 100/50 mmHg (10/25 2156) SpO2:  [94 %-99 %] 97 % (10/25 2156) Weight:  [175 lb 4.3 oz (79.5 kg)] 175 lb 4.3 oz (79.5 kg) (10/26 0531)   Current Weight  02/24/15 175 lb 4.3 oz (79.5 kg)      Intake/Output from previous day: 10/25 0701 - 10/26 0700 In: 240 [P.O.:240] Out: 330 [Urine:300; Chest Tube:30]   Physical Exam:  Cardiovascular: IRRR IRRR  Pulmonary: Some basilar crackles Abdomen: Soft, non tender, bowel sounds present. Extremities: Mild bilateral lower extremity edema. Wounds: Clean and dry.  No erythema or signs of infection. Neurologic: AAOx3, grossly intact without focal deficits  Lab Results: CBC: Recent Labs  02/23/15 0538 02/24/15 0224  WBC 9.9 11.5*  HGB 9.8* 9.4*  HCT 30.8* 29.1*  PLT 77* 102*   BMET:  Recent Labs  02/23/15 0538 02/24/15 0224  NA 136 133*  K 4.1 4.7  CL 97* 95*  CO2 29 25  GLUCOSE 102* 104*  BUN 29* 48*  CREATININE 3.26* 4.59*  CALCIUM 8.7* 9.0    PT/INR:  Lab Results  Component Value Date   INR 2.00* 02/24/2015   INR 1.76* 02/23/2015   INR 1.81* 02/22/2015   ABG:  INR: Will add last result for INR, ABG once components are confirmed Will add last 4 CBG results once components are confirmed  Assessment/Plan:  1. CV - Persistent a fib with CVR. On Diltaizem CD 180 at hs Lopressor 12.5 mg bid, and Coumadin. INR increased from 1.76 to  2. 2.  Pulmonary - On 2 liters of oxygen via . Wean as tolerates. CXR this am appears to show a trace right apical pneumothorax, left base atelectasis, mild CHF. Encourage incentive spirometer. 3. Chronic anemia - H and H stable at 9.4 29.1. On Nulecit 4.ESRD-Had HD 10/24. Creatinine 4.59. Per nephrology. 5. Thrombocytopenia-platelets up to 102,000. Will discuss with Dr. Roxy Manns when to restart baby ecasa. 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 Per my discussion with wife, will stop Fentanyl PRN and Oxycodone. Tylenol PRN. Monitor 8. Wife to talk to social work about SNF vs home. She has to return to work soon so he may benefit from SNF.  ZIMMERMAN,DONIELLE MPA-C 02/24/2015,7:57 AM  I have seen and examined the patient and agree with the assessment and plan as outlined.  [name] has had postoperative delirium that has waxed and waned in severity ever since surgery.  He otherwise looks pretty good and is slowly improving.  He will likely need short term placement in SNF for rehab and PT.  Restart low dose ASA  I spent in excess of 15 minutes during the conduct of this hospital encounter and >50% of this time involved direct face-to-face encounter with the patient for counseling and/or coordination of their care.  [name], MD 02/24/2015 9:32 AM

## 2015-02-24 NOTE — Procedures (Signed)
I have personally attended this patient's dialysis session.   Tolerating TMT so far. 2K bath for K 4.7 AVF 400 OLC green Oriented at this time.  Jamal Maes, MD [facility] [phone] Pager 02/24/2015, 12:36 PM

## 2015-02-24 NOTE — Progress Notes (Signed)
CSW informed that pt will not have 24 hours supervision/assistance at home- initial referral sent to [facility]- wife prefers [facility]  CSW will continue to follow  Domenica Reamer, [place] Social Worker [phone]

## 2015-02-24 NOTE — Care Management Note (Signed)
Case Management Note Marvetta Gibbons RN, BSN Unit 2W-Case Manager [phone]  Patient Details  Name: [name] MRN: [id number] Date of Birth: 1940/06/01  Subjective/Objective:     Pt admitted s/p TAVR               Action/Plan: PTA pt lived at home with spouse- hx ESRD- HD- M/W/F- spoke with pt and wife at bedside regarding d/c plans- per conversation wife will need to get back to work next week and pt will not have 24/7 assistance at home- discussed home with [facility] and private duty assistance. Per wife pt does have a long term care plan that might cover some of there expenses- wife would like to look at [facility] for rehab until pt stronger and able to stay by himself- first preference would be [facility]- pt is hesitant but agreeable- will consult CSW to f/u with possible placement.   Expected Discharge Date:  02/26/15               Expected Discharge Plan:  Skilled Nursing Facility  In-House Referral:  Clinical Social Work  Discharge planning Services  CM Consult  Post Acute Care Choice:    Choice offered to:     DME Arranged:    DME Agency:     HH Arranged:    [place] Agency:     Status of Service:  In process, will continue to follow  Medicare Important Message Given:  Yes-second notification given Date Medicare IM Given:    Medicare IM give by:    Date Additional Medicare IM Given:    Additional Medicare Important Message give by:     If discussed at [place] of Stay Meetings, dates discussed:    Additional Comments:  Dawayne Patricia, RN 02/24/2015, 10:59 AM

## 2015-02-25 DIAGNOSIS — I350 Nonrheumatic aortic (valve) stenosis: Secondary | ICD-10-CM

## 2015-02-25 LAB — PROTIME-INR
INR: 2.22 — AB (ref 0.00–1.49)
Prothrombin Time: 24.4 seconds — ABNORMAL HIGH (ref 11.6–15.2)

## 2015-02-25 LAB — RENAL FUNCTION PANEL
Albumin: 3 g/dL — ABNORMAL LOW (ref 3.5–5.0)
Anion gap: 11 (ref 5–15)
BUN: 26 mg/dL — ABNORMAL HIGH (ref 6–20)
CALCIUM: 9.2 mg/dL (ref 8.9–10.3)
CO2: 26 mmol/L (ref 22–32)
CREATININE: 3.19 mg/dL — AB (ref 0.61–1.24)
Chloride: 98 mmol/L — ABNORMAL LOW (ref 101–111)
GFR calc non Af Amer: 18 mL/min — ABNORMAL LOW (ref >60)
GFR, EST AFRICAN AMERICAN: 21 mL/min — AB (ref >60)
GLUCOSE: 107 mg/dL — AB (ref 65–99)
Phosphorus: 3.2 mg/dL (ref 2.5–4.6)
Potassium: 4.1 mmol/L (ref 3.5–5.1)
SODIUM: 135 mmol/L (ref 135–145)

## 2015-02-25 NOTE — Progress Notes (Signed)
Utilization review completed.  

## 2015-02-25 NOTE — Progress Notes (Addendum)
Physical Therapy Treatment Patient Details Name: [name] MRN: [id number] DOB: [birth year] Today's Date: 02/25/2015    History of Present Illness Patient is a 74 y/o male presents with severe aortic stenosis s/p TAVR. 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    PT Comments    Patient progressing slowly with mobility. Pt impulsive during mobility today with poor safety awareness putting pt at increased risk for falls. Seems confused at times. Requires frequent rest breaks during exercise due to DOE. HR elevated with mobility. Difficult obtaining accurate Sp02 during session - appeared to be in mid 80s on RA. Discharge recommendation updated to ST SNF as pt does not have 24/7 S at home. Will follow acutely.   Follow Up Recommendations  SNF     Equipment Recommendations  Rolling walker with 5" wheels    Recommendations for Other Services       Precautions / Restrictions Precautions Precautions: Fall Restrictions Weight Bearing Restrictions: No    Mobility  Bed Mobility               General bed mobility comments: Sitting in recliner upon PT arrival.   Transfers Overall transfer level: Needs assistance Equipment used: None Transfers: Sit to/from Stand Sit to Stand: Supervision         General transfer comment: Supervision for safety. Stood from [employer].   Ambulation/Gait Ambulation/Gait assistance: Min guard Ambulation Distance (Feet): 175 Feet Assistive device: Rolling walker (2 wheeled) Gait Pattern/deviations: Step-through pattern;Decreased stride length;Staggering right;Staggering left Gait velocity: increased today- unsafe   General Gait Details: Unsteady gait esp with head turns- staggering noted. Cues for RW management and safety. 2 standing rest breaks.   Stairs            Wheelchair Mobility    Modified Rankin (Stroke Patients Only)       Balance Overall balance assessment: Needs  assistance Sitting-balance support: Feet supported;No upper extremity supported Sitting balance-Leahy Scale: Good     Standing balance support: During functional activity Standing balance-Leahy Scale: Fair Standing balance comment: Requires UE support during dynamic standing.                    Cognition Arousal/Alertness: Awake/alert Behavior During Therapy: Impulsive;WFL for tasks assessed/performed Overall Cognitive Status: Impaired/Different from baseline Area of Impairment: Safety/judgement;Memory     Memory: Decreased short-term memory   Safety/Judgement: Decreased awareness of safety;Decreased awareness of deficits     General Comments: Seems confused at times. Goes on tangents unrelated to topic of conversation.    Exercises General Exercises - Lower Extremity Long Arc Quad: Both;15 reps;Seated Hip Flexion/Marching: Both;15 reps;Seated Toe Raises: Both;15 reps;Seated Heel Raises: Both;15 reps;Seated    General Comments        Pertinent Vitals/Pain Pain Assessment: No/denies pain    Home Living                      Prior Function            PT Goals (current goals can now be found in the care plan section) Progress towards PT goals: Progressing toward goals    Frequency  Min 3X/week    PT Plan Discharge plan needs to be updated    Co-evaluation             End of Session Equipment Utilized During Treatment: Gait belt Activity Tolerance: Patient limited by fatigue;Patient tolerated treatment well Patient left: in chair;with  call bell/phone within reach;with family/visitor present     Time: 1352-1415 PT Time Calculation (min) (ACUTE ONLY): 23 min  Charges:  $Gait Training: 8-22 mins $Therapeutic Exercise: 8-22 mins                    G Codes:      [name] A [name] 02/25/2015, 3:08 PM [name], [place], DPT [phone]

## 2015-02-25 NOTE — Progress Notes (Signed)
[facility] Progress Note   Subjective:  Tolerated dialysis well yesterday. 2 liters off with BP low 100's Clear this AM Preoccupied with Solitaire game on his iPAD "Can't get comfortable in the bed or the chair" Ate most of breakfast   Filed Vitals:   02/24/15 2126 02/24/15 2233 02/25/15 0500 02/25/15 0514  BP: 109/55 105/63  122/66  Pulse: 89 98  92  Temp: 98.1 F (36.7 C)   97.7 F (36.5 C)  TempSrc: Oral   Oral  Resp: 20   18  Height:      Weight:   77.5 kg (170 lb 13.7 oz)   SpO2: 92% 93%  99%   Exam: BP 122/66 mmHg  Pulse 92  Temp(Src) 97.7 F (36.5 C) (Oral)  Resp 18  Ht 5\' 8"  (1.727 m)  Wt 77.5 kg (170 lb 13.7 oz)  BMI 25.98 kg/m2  SpO2 99%  Alert, awake, sitting up in the chair AFib Irreg Irreg rate HR 90's Bruit from AVF heard across chest Chest largely clear posteriorly Ext trace edema only Neuro alert, oriented to person, place, situation. Very pleasant. Hard of hearing. LUA AVF +bruit  Weight trending (EDW 77 kg) 10/19 81.6 10/20 82.9 10/21 81.2 10/23 78.8  CRRT stopped 10/24 80.3   IHD 10/25 77.1 kg  10/26 79.6-->77.4 pre/post dialysis 10/27 77.5 kg  ECHO normal LVEF 02/18/15  Dialysis prescription [facility] MWF   4h  77kg  3/2.0 bath  Hep 4000  LUA AVF Venofer 61mb q wed hd  Other op lab hgb 12.5 oct 14 , ca 9.2 phos 6.1 pth 213  Assessment/Plan 1. Severe AS =   SP TAVR/transapical approach 02/16/15.  2. Pulm edema/ vol excess - close to prior outpt EDW (though EDW will be lower at discharge. Continue to slowly lower with HD 3. Post procedure delerium - issues with confusion off/on. Today seems quite clear. 4. Hypotension - Off pressors (10/23). Pressures generally. Remains on dilt 180 and BID metoprolol - so far we have been able to successfully dialyze with current doses on board. 5. Afib - on po dilt and MTP, coumadin 6. ESRD - CRRT stopped 10/23. Transitioned back to IHD 10/25 and so far has tolerated HD X 2 this  week without issues 7. Anemia - Hb 9.4 10/26. Not on ESA. Low TSat. Fe load with HD X 10 doses started 10/26.  8. Thrombocytopenia - felt post op related. 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 Reduced Renvela.2->1 with meals due to low phos of 2.8. Today 3.2 good.  Pt is on daily calcitriol. Not on med list from HD and does not receive at the kidney center so will discontinue.  10. Hx CAD/ CM 11. Disposition - ? SNF vs home?  [name] Maes, MD [facility] [phone] Pager 02/25/2015, 8:46 AM   Additional Objective Data  10/24 CXR - stable chest tubes. Vascular congestion without frank edema. Small left effusion. Prosthetic aortic valve. 02/24/15 CXR    COMPARISON: 02/23/2015 Cardiac shadow is enlarged but stable. Stent based aortic valve replacement is again seen. Mild vascular congestion and interstitial changes are noted consistent with CHF. The right chest tube is been removed in the interval. A tiny right pneumothorax remains in the apex but stable. No new focal infiltrate is seen. IMPRESSION: Tiny right pneumothorax is noted.  Changes of congestive failure Electronically Signed  By: Inez Catalina M.D   On: 02/24/2015 08:00   Recent Labs Lab 02/23/15 4540 02/24/15 0224 02/25/15  0226  NA 136 133* 135  K 4.1 4.7 4.1  CL 97* 95* 98*  CO2 29 25 26   GLUCOSE 102* 104* 107*  BUN 29* 48* 26*  CREATININE 3.26* 4.59* 3.19*  CALCIUM 8.7* 9.0 9.2  PHOS 2.8 4.0 3.2    Recent Labs Lab 02/20/15 0402  02/23/15 0538 02/24/15 0224 02/25/15 0226  AST 133*  --   --   --   --   ALT 245*  --   --   --   --   ALKPHOS 60  --   --   --   --   BILITOT 0.8  --   --   --   --   PROT 5.0*  --   --   --   --   ALBUMIN 2.6*  < > 2.2* 2.3* 3.0*  < > = values in this interval not displayed.  Recent Labs Lab 02/22/15 0330 02/23/15 0538 02/24/15 0224  WBC 8.7 9.9 11.5*  HGB 9.5* 9.8* 9.4*  HCT 30.1* 30.8* 29.1*  MCV 109.5* 109.2* 107.8*  PLT 67* 77* 102*     Results for VAN MENG, WINTERTON (MRN [id number]) as of 02/23/2015 12:40  Ref. Range 02/23/2015 05:38  Iron Latest Ref Range: 45-182 ug/dL 27 (L)  UIBC Latest Units: ug/dL 233  TIBC Latest Ref Range: 250-450 ug/dL 260  Saturation Ratios Latest Ref Range: 17.9-39.5 % 10 (L)   Lab Results  Component Value Date   INR 2.22* 02/25/2015   INR 2.00* 02/24/2015   INR 1.76* 02/23/2015     Scheduled Medications . antiseptic oral rinse  7 mL Mouth Rinse BID  . aspirin EC  81 mg Oral Daily  . calcitRIOL  0.25 mcg Oral Daily  . diltiazem  180 mg Oral QHS  . ferric gluconate (FERRLECIT/NULECIT) IV  125 mg Intravenous Q M,W,F-HD  . lipase/protease/amylase  48,000 Units Oral BID WC  . loperamide  2 mg Oral BID  . metoprolol tartrate  12.5 mg Oral BID  . multivitamin  1 tablet Oral QHS  . pantoprazole  40 mg Oral Daily  . sevelamer carbonate  800 mg Oral TID WC  . sodium chloride  10-40 mL Intracatheter Q12H  . sodium chloride  3 mL Intravenous Q12H  . warfarin  5 mg Oral q1800  . Warfarin - Physician Dosing Inpatient   Does not apply q1800   Infusions: . sodium chloride 10 mL/hr (02/21/15 2112)   sodium chloride, sodium chloride, sodium chloride, sodium chloride, acetaminophen, albumin human, alteplase, diphenhydrAMINE, heparin, heparin, heparin, hydrOXYzine, Influenza vac split quadrivalent PF, lidocaine (PF), lidocaine-prilocaine, metoprolol, ondansetron (ZOFRAN) IV, pentafluoroprop-tetrafluoroeth, sodium chloride, sodium chloride

## 2015-02-25 NOTE — Progress Notes (Addendum)
       [address]       [place] [address]             [phone]          9 Days Post-Op Procedure(s) (LRB): TRANSCATHETER AORTIC VALVE REPLACEMENT, TRANSAPICAL (N/A) TRANSESOPHAGEAL ECHOCARDIOGRAM (TEE) (N/A)  Subjective: Comfortable this am. Mental status appears back to baseline. Appetite good. Desats when off oxygen.   Objective: Vital signs in last 24 hours: Patient Vitals for the past 24 hrs:  BP Temp Temp src Pulse Resp SpO2 Weight  02/25/15 0514 122/66 mmHg 97.7 F (36.5 C) Oral 92 18 99 % -  02/25/15 0500 - - - - - - 170 lb 13.7 oz (77.5 kg)  02/24/15 2233 105/63 mmHg - - 98 - 93 % -  02/24/15 2126 (!) 109/55 mmHg 98.1 F (36.7 C) Oral 89 20 92 % -  02/24/15 1552 107/65 mmHg 97.8 F (36.6 C) - 84 20 - 170 lb 10.2 oz (77.4 kg)  02/24/15 1530 110/60 mmHg - - 89 - - -  02/24/15 1500 102/63 mmHg - - 73 - - -  02/24/15 1430 (!) 106/53 mmHg - - 73 - - -  02/24/15 1400 (!) 101/58 mmHg - - 81 - - -  02/24/15 1330 107/65 mmHg - - 98 - - -  02/24/15 1300 (!) 111/54 mmHg - - 84 - - -  02/24/15 1230 (!) 104/57 mmHg - - 81 - - -  02/24/15 1200 (!) 95/53 mmHg - - 90 - - -  02/24/15 1150 105/65 mmHg - - 80 - - -  02/24/15 1145 118/63 mmHg 97.9 F (36.6 C) - 73 (!) 2 99 % 175 lb 7.8 oz (79.6 kg)   Current Weight  02/25/15 170 lb 13.7 oz (77.5 kg)     Intake/Output from previous day: 10/26 0701 - 10/27 0700 In: 480 [P.O.:480] Out: 2275 [Urine:275]    PHYSICAL EXAM:  Heart: irr irr, rates around 100 Lungs: Clear, slightly decreased BS in bases Wound: Clean and dry Extremities: Mild LE edema    Lab Results: CBC: Recent Labs  02/23/15 0538 02/24/15 0224  WBC 9.9 11.5*  HGB 9.8* 9.4*  HCT 30.8* 29.1*  PLT 77* 102*   BMET:  Recent Labs  02/24/15 0224 02/25/15 0226  NA 133* 135  K 4.7 4.1  CL 95* 98*  CO2 25 26  GLUCOSE 104* 107*  BUN 48* 26*  CREATININE 4.59* 3.19*  CALCIUM 9.0 9.2    PT/INR:  Recent Labs   02/25/15 0226  LABPROT 24.4*  INR 2.22*      Assessment/Plan: S/P Procedure(s) (LRB): TRANSCATHETER AORTIC VALVE REPLACEMENT, TRANSAPICAL (N/A) TRANSESOPHAGEAL ECHOCARDIOGRAM (TEE) (N/A)  CV- AF, rates generally controlled. Continue Cardizem, Lopressor. BPs stable. Coumadin therapeutic.  Pulm- Continue IS/pulm toilet and wean O2 as able.  ESRD- renal following for HD.  Postop delirium- MS seems baseline this am. Off narcotics.  Disp- Social work looking into SNF placement.   LOS: 9 days    COLLINS,GINA H 02/25/2015  I have seen and examined the patient and agree with the assessment and plan as outlined.  Looks much better today.  Potentially could be ready for d/c to SNF on Saturday if he continues to do well and is close to his baseline dry weight after HD tomorrow.  [name], MD 02/25/2015 3:25 PM

## 2015-02-25 NOTE — Progress Notes (Signed)
CARDIAC REHAB PHASE I   PRE:  Rate/Rhythm: 91 afib    BP: sitting 111/64    SaO2: 85-99 RA  MODE:  Ambulation: 550 ft   POST:  Rate/Rhythm: 147 afib    BP: sitting 130/63     SaO2: 89 RA, 92 RA with rest   Pt SAO2 inconsistent and difficult to register however seemed to be somewhat more reliable today. At times pt low with walking, 85 RA with SOB. Up to 90s with rest and pursed lip breathing. Rest x3 for pursed lip breathing. SaO2 89 RA on return to room however HR had increased to 140s afib. Slowly down with rest. Had pt use IS which he can inspire to 1700 at times altough he does it too fast, not controlled. Encouraged pt to slow down but he would not. Encouraged more walking and IS use today. Left off O2. [phone]  Josephina Shih [place] CES, ACSM 02/25/2015 10:18 AM

## 2015-02-25 NOTE — Progress Notes (Signed)
Was notified by CCMD that the patient had a 2.4 second pause. Patient was asymptomatic when I checked on him.

## 2015-02-26 LAB — RENAL FUNCTION PANEL
ALBUMIN: 2.9 g/dL — AB (ref 3.5–5.0)
Anion gap: 12 (ref 5–15)
BUN: 50 mg/dL — AB (ref 6–20)
CHLORIDE: 94 mmol/L — AB (ref 101–111)
CO2: 24 mmol/L (ref 22–32)
CREATININE: 4.91 mg/dL — AB (ref 0.61–1.24)
Calcium: 9 mg/dL (ref 8.9–10.3)
GFR, EST AFRICAN AMERICAN: 12 mL/min — AB (ref >60)
GFR, EST NON AFRICAN AMERICAN: 11 mL/min — AB (ref >60)
Glucose, Bld: 139 mg/dL — ABNORMAL HIGH (ref 65–99)
PHOSPHORUS: 4.8 mg/dL — AB (ref 2.5–4.6)
POTASSIUM: 4.1 mmol/L (ref 3.5–5.1)
Sodium: 130 mmol/L — ABNORMAL LOW (ref 135–145)

## 2015-02-26 LAB — CBC WITH DIFFERENTIAL/PLATELET
BASOS ABS: 0 10*3/uL (ref 0.0–0.1)
BASOS PCT: 0 %
Eosinophils Absolute: 0.8 10*3/uL — ABNORMAL HIGH (ref 0.0–0.7)
Eosinophils Relative: 6 %
HEMATOCRIT: 29 % — AB (ref 39.0–52.0)
Hemoglobin: 9.7 g/dL — ABNORMAL LOW (ref 13.0–17.0)
LYMPHS PCT: 14 %
Lymphs Abs: 1.7 10*3/uL (ref 0.7–4.0)
MCH: 35.7 pg — ABNORMAL HIGH (ref 26.0–34.0)
MCHC: 33.4 g/dL (ref 30.0–36.0)
MCV: 106.6 fL — AB (ref 78.0–100.0)
Monocytes Absolute: 0.9 10*3/uL (ref 0.1–1.0)
Monocytes Relative: 7 %
NEUTROS ABS: 8.6 10*3/uL — AB (ref 1.7–7.7)
NEUTROS PCT: 73 %
Platelets: 190 10*3/uL (ref 150–400)
RBC: 2.72 MIL/uL — AB (ref 4.22–5.81)
RDW: 17.1 % — AB (ref 11.5–15.5)
WBC: 12 10*3/uL — AB (ref 4.0–10.5)

## 2015-02-26 LAB — PROTIME-INR
INR: 2.53 — AB (ref 0.00–1.49)
INR: 2.54 — AB (ref 0.00–1.49)
PROTHROMBIN TIME: 26.9 s — AB (ref 11.6–15.2)
PROTHROMBIN TIME: 27 s — AB (ref 11.6–15.2)

## 2015-02-26 MED ORDER — WARFARIN SODIUM 2.5 MG PO TABS
2.5000 mg | ORAL_TABLET | Freq: Every day | ORAL | Status: DC
  Administered 2015-02-26 – 2015-02-27 (×2): 2.5 mg via ORAL
  Filled 2015-02-26 (×2): qty 1

## 2015-02-26 NOTE — Progress Notes (Addendum)
      [address]       [place] [address]             [phone]        10 Days Post-Op Procedure(s) (LRB): TRANSCATHETER AORTIC VALVE REPLACEMENT, TRANSAPICAL (N/A) TRANSESOPHAGEAL ECHOCARDIOGRAM (TEE) (N/A)  Subjective: On HD this am. Patient states his breathing is "so so"  Objective: Vital signs in last 24 hours: Temp:  [97.8 F (36.6 C)-98.4 F (36.9 C)] 97.8 F (36.6 C) (10/28 0742) Pulse Rate:  [67-101] 87 (10/28 1030) Cardiac Rhythm:  [-] Atrial fibrillation;Bundle branch block (10/28 0700) Resp:  [18] 18 (10/28 0742) BP: (94-143)/(49-78) 104/56 mmHg (10/28 1030) SpO2:  [92 %-98 %] 92 % (10/28 0742) Weight:  [173 lb 6.4 oz (78.654 kg)-173 lb 15.1 oz (78.9 kg)] 173 lb 15.1 oz (78.9 kg) (10/28 0742)   Current Weight  02/26/15 173 lb 15.1 oz (78.9 kg)      Intake/Output from previous day: 10/27 0701 - 10/28 0700 In: 1080 [P.O.:1080] Out: 1051 [Urine:1050; Stool:1]   Physical Exam:  Cardiovascular: IRRR IRRR  Pulmonary: Slightly diminished at bases Abdomen: Soft, non tender, bowel sounds present. Extremities: Mild bilateral lower extremity edema. Wound: Clean and dry.  No erythema or signs of infection.   Lab Results: CBC:  Recent Labs  02/24/15 0224 02/26/15 0821  WBC 11.5* 12.0*  HGB 9.4* 9.7*  HCT 29.1* 29.0*  PLT 102* 190   BMET:   Recent Labs  02/25/15 0226 02/26/15 0821  NA 135 130*  K 4.1 4.1  CL 98* 94*  CO2 26 24  GLUCOSE 107* 139*  BUN 26* 50*  CREATININE 3.19* 4.91*  CALCIUM 9.2 9.0    PT/INR:  Lab Results  Component Value Date   INR 2.53* 02/26/2015   INR 2.22* 02/25/2015   INR 2.00* 02/24/2015   ABG:  INR: Will add last result for INR, ABG once components are confirmed Will add last 4 CBG results once components are confirmed  Assessment/Plan:  1. CV - Persistent a fib with CVR. On Diltaizem CD 180 at hs Lopressor 12.5 mg bid, and Coumadin 5 mg daily. INR yesterday was up to 2.53. No INR  drawn today so will order but will also decrease Coumadin. 2.  Pulmonary - On room air . Encourage incentive spirometer. 3. Chronic anemia - H and H stable at 9.7 and  29. On Nulecit 4.ESRD-. Creatinine 4.91. Per nephrology. 5. Thrombocytopenia-platelets up to 102,000.  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 Post op delirium mostly resolved 8. Will need SNF when ready for discharge, possibly in am  ZIMMERMAN,DONIELLE MPA-C 02/26/2015,11:23 AM  I have seen and examined the patient and agree with the assessment and plan as outlined.  Possibly ready for d/c soon once back on routine schedule for HD  [name], MD 02/26/2015 12:22 PM

## 2015-02-26 NOTE — Progress Notes (Signed)
Ed completed with pt and wife. Voiced understanding and requests his name be sent to [facility]. Pt feeling exhausted after HD today, will allow to rest. Anderson, ACSM 2:19 PM 02/26/2015

## 2015-02-26 NOTE — Clinical Social Work Placement (Signed)
   CLINICAL SOCIAL WORK PLACEMENT  NOTE  Date:  02/26/2015  Patient Details  Name: [name] MRN: [id number] Date of Birth: November 20, 1940  Clinical Social Work is seeking post-discharge placement for this patient at the [place] level of care (*CSW will initial, date and re-position this form in  chart as items are completed):  Yes   Patient/family provided with [facility] Work Department's list of facilities offering this level of care within the geographic area requested by the patient (or if unable, by the patient's family).  Yes   Patient/family informed of their freedom to choose among providers that offer the needed level of care, that participate in Medicare, Medicaid or managed care program needed by the patient, have an available bed and are willing to accept the patient.  Yes   Patient/family informed of [facility]'s ownership interest in [facility] and [facility], as well as of the fact that they are under no obligation to receive care at these facilities.  PASRR submitted to EDS on 02/24/15     PASRR number received on 02/24/15     Existing PASRR number confirmed on       FL2 transmitted to all facilities in geographic area requested by pt/family on 02/24/15     FL2 transmitted to all facilities within larger geographic area on       Patient informed that his/her managed care company has contracts with or will negotiate with certain facilities, including the following:            Patient/family informed of bed offers received.  Patient chooses bed at       Physician recommends and patient chooses bed at      Patient to be transferred to   on  .  Patient to be transferred to facility by       Patient family notified on   of transfer.  Name of family member notified:        PHYSICIAN Please sign FL2     Additional Comment:    _______________________________________________ Cranford Mon, LCSW 02/26/2015,  1:25 PM

## 2015-02-26 NOTE — Care Management Important Message (Signed)
Important Message  Patient Details  Name: [name] MRN: [id number] Date of Birth: Jul 15, 1940   Medicare Important Message Given:  Yes-fourth notification given    Nathen May 02/26/2015, 11:43 AM

## 2015-02-26 NOTE — Progress Notes (Signed)
[facility] Progress Note   Subjective:  In dialysis this morning "boring in here for 4 hours" Does not seem confused this morning and no notes re that from yesterday though PT noted that "pt sometimes impulsive with PT making his fall risk higher".    Filed Vitals:   02/26/15 0420 02/26/15 0425 02/26/15 0429 02/26/15 0742  BP: 94/56 103/53 109/55 143/52  Pulse: 67   94  Temp: 97.9 F (36.6 C)   97.8 F (36.6 C)  TempSrc: Oral   Oral  Resp: 18   18  Height:      Weight: 78.654 kg (173 lb 6.4 oz)   78.9 kg (173 lb 15.1 oz)  SpO2: 95%   92%   Exam: BP 143/52 mmHg  Pulse 94  Temp(Src) 97.8 F (36.6 C) (Oral)  Resp 18  Ht 5\' 8"  (1.727 m)  Wt 78.9 kg (173 lb 15.1 oz)  BMI 26.45 kg/m2  SpO2 92%  Alert, awake, and seen in HD AFib Irreg Irreg rate HR 90's Bruit from lueft upper AVF heard across chest - and AVF currently cannulated for HD Chest largely clear posteriorly Ext 1+ edema bilaterally Neuro alert, oriented to person, place, situation.  Very pleasant.  Hard of hearing.   Weight trending (previous EDW 77 kg) 10/19 81.6 10/20 82.9 10/21 81.2 10/23 78.8  CRRT stopped 10/24 80.3   IHD 10/25 77.1 kg  10/26 79.6-->77.4 pre/post dialysis 10/27 77.5 kg 10/28 78.9 pre HD  ECHO normal LVEF 02/18/15  Dialysis prescription [facility]  MWF    4h   77kg   Will have lower EDW at discharge 3k/2.0 [facility]   Hep 4000   LUA AVF Venofer 50 q wed Other op lab hgb 12.5 oct 14 , ca 9.2 phos 6.1 pth 213  Assessment/Plan 1. Severe AS =   SP TAVR/transapical approach 02/16/15.  2. Pulm edema/ vol excess - trying to challenge weight slowly with each HD. 3 liter goal today will in theory drop him below previous EDW of 77 kg. 3. Post procedure delerium - issues with confusion off/on. Today seems clear. 4. Hypotension - Off pressors (10/23). Pressures generally on the lower side. Remains on dilt 180 and BID metoprolol - so far we have been able to successfully  dialyze with current doses on board. 5. Afib - on po dilt and MTP, coumadin 6. ESRD - CRRT stopped 10/23. Transitioned back to IHD 10/25 and so far has tolerated HD X 2 this week without issues and today going well so far. 7. Anemia - Hb 9.4 10/26. Not on ESA. Low TSat. Fe load with HD X 10 doses started 10/26.  8. Thrombocytopenia - felt post op related. Plts up to 102 on 10/26 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 Reduced Renvela.2->1 with meals due to low phos of 2.8. Last phos was 3.2 good.  Pt was on daily calcitriol. Not on med list from HD and does not receive at the kidney center so I discontinued 10. Hx CAD/ CM 11. Disposition - Anticipate SNF  Jamal Maes, MD [facility] [phone] Pager 02/26/2015, 8:22 AM   Additional Objective Data  10/24 CXR - stable chest tubes. Vascular congestion without frank edema. Small left effusion. Prosthetic aortic valve. 02/24/15 CXR   COMPARISON: 02/23/2015 Cardiac shadow is enlarged but stable. Stent based aortic valve replacement is again seen. Mild vascular congestion and interstitial changes are noted consistent with CHF. The right chest tube is been removed in the interval. A tiny right pneumothorax  remains in the apex but stable. No new focal infiltrate is seen. IMPRESSION: Tiny right pneumothorax is noted.  Changes of congestive failure    Recent Labs Lab 02/23/15 0538 02/24/15 0224 02/25/15 0226  NA 136 133* 135  K 4.1 4.7 4.1  CL 97* 95* 98*  CO2 29 25 26   GLUCOSE 102* 104* 107*  BUN 29* 48* 26*  CREATININE 3.26* 4.59* 3.19*  CALCIUM 8.7* 9.0 9.2  PHOS 2.8 4.0 3.2    Recent Labs Lab 02/20/15 0402  02/23/15 0538 02/24/15 0224 02/25/15 0226  AST 133*  --   --   --   --   ALT 245*  --   --   --   --   ALKPHOS 60  --   --   --   --   BILITOT 0.8  --   --   --   --   PROT 5.0*  --   --   --   --   ALBUMIN 2.6*  < > 2.2* 2.3* 3.0*  < > = values in this interval not displayed.  Recent Labs Lab 02/22/15 0330  02/23/15 0538 02/24/15 0224  WBC 8.7 9.9 11.5*  HGB 9.5* 9.8* 9.4*  HCT 30.1* 30.8* 29.1*  MCV 109.5* 109.2* 107.8*  PLT 67* 77* 102*    Results for VAN FOUAD, TAUL (MRN [id number]) as of 02/23/2015 12:40  Ref. Range 02/23/2015 05:38  Iron Latest Ref Range: 45-182 ug/dL 27 (L)  UIBC Latest Units: ug/dL 233  TIBC Latest Ref Range: 250-450 ug/dL 260  Saturation Ratios Latest Ref Range: 17.9-39.5 % 10 (L)   Lab Results  Component Value Date   INR 2.53* 02/26/2015   INR 2.22* 02/25/2015   INR 2.00* 02/24/2015     Scheduled Medications . antiseptic oral rinse  7 mL Mouth Rinse BID  . aspirin EC  81 mg Oral Daily  . diltiazem  180 mg Oral QHS  . ferric gluconate (FERRLECIT/NULECIT) IV  125 mg Intravenous Q M,W,F-HD  . lipase/protease/amylase  48,000 Units Oral BID WC  . loperamide  2 mg Oral BID  . metoprolol tartrate  12.5 mg Oral BID  . multivitamin  1 tablet Oral QHS  . pantoprazole  40 mg Oral Daily  . sevelamer carbonate  800 mg Oral TID WC  . sodium chloride  10-40 mL Intracatheter Q12H  . sodium chloride  3 mL Intravenous Q12H  . warfarin  5 mg Oral q1800  . Warfarin - Physician Dosing Inpatient   Does not apply q1800   Infusions: . sodium chloride 10 mL/hr (02/21/15 2112)   sodium chloride, sodium chloride, sodium chloride, sodium chloride, acetaminophen, albumin human, alteplase, diphenhydrAMINE, hydrOXYzine, Influenza vac split quadrivalent PF, lidocaine (PF), lidocaine-prilocaine, metoprolol, ondansetron (ZOFRAN) IV, pentafluoroprop-tetrafluoroeth, sodium chloride, sodium chloride

## 2015-02-26 NOTE — Procedures (Signed)
I have personally attended this patient's dialysis session.   2K bath pending labs Have increase goal to 2.5 liters Pre dialysis weight 78.9 kg (prev EDW 77 and needs to be lower) BP 935'T systolic at present with AF rate 90's Labs are pending L AVF 400 OLC green  Jamal Maes, MD [facility] [phone] Pager 02/26/2015, 8:37 AM

## 2015-02-26 NOTE — Clinical Social Work Note (Signed)
Clinical Social Work Assessment  Patient Details  Name: [name] MRN: [id number] Date of Birth: 1940-09-17  Date of referral:  02/24/15               Reason for consult:  Facility Placement                Permission sought to share information with:  Family Supports, Chartered certified accountant granted to share information::  Yes, Verbal Permission Granted  Name::     Charity fundraiser::  [facility]  Relationship::  wife  Contact Information:     Housing/Transportation Living arrangements for the past 2 months:  Single Family Home Source of Information:  Patient, Spouse Patient Interpreter Needed:  None Criminal Activity/Legal Involvement Pertinent to Current Situation/Hospitalization:  No - Comment as needed Significant Relationships:  Spouse Lives with:  Spouse Do you feel safe going back to the place where you live?  Yes Need for family participation in patient care:  Yes (Comment)  Care giving concerns:  Pt wife works and cannot provide 24 hour assistance at time of DC   Facilities manager / plan:  CSW spoke with pt and wife about short term SNF  Employment status:  Retired Nurse, adult PT Recommendations:  [place] / Referral to community resources:  [place]  Patient/Family's Response to care:  Pt is hesitant to go to SNF but agreeable since wife is adamant about him going- wife does not want pt home alone following surgery- she expresses preference for [facility] SNF if possible  Patient/Family's Understanding of and Emotional Response to Diagnosis, Current Treatment, and Prognosis:  Pt and wife have no questions or concerns  Emotional Assessment Appearance:  Appears older than stated age Attitude/Demeanor/Rapport:    Affect (typically observed):  Appropriate Orientation:  Oriented to Self, Oriented to Place, Oriented to  Time, Oriented to Situation Alcohol /  Substance use:  Not Applicable Psych involvement (Current and /or in the community):  No (Comment)  Discharge Needs  Concerns to be addressed:  Home Safety Concerns Readmission within the last 30 days:  No Current discharge risk:  Physical Impairment Barriers to Discharge:  Continued Medical Work up   [employer], LCSW 02/26/2015, 1:23 PM

## 2015-02-26 NOTE — Progress Notes (Addendum)
Wife prefers [facility] SNF but no bed available today- non anticipated for tomorrow  Pt has bed available at [facility] today- pt wife is agreeable and has already signed paperwork if pt is ready for DC today- [facility] can accept weekend discharge (will need DC summary faxed to [phone] if pt goes over the weekend)  CSW will continue to follow.  Domenica Reamer, [place] Social Worker [phone]

## 2015-02-27 LAB — RENAL FUNCTION PANEL
Albumin: 3 g/dL — ABNORMAL LOW (ref 3.5–5.0)
Anion gap: 14 (ref 5–15)
BUN: 29 mg/dL — AB (ref 6–20)
CALCIUM: 9 mg/dL (ref 8.9–10.3)
CHLORIDE: 96 mmol/L — AB (ref 101–111)
CO2: 27 mmol/L (ref 22–32)
CREATININE: 3.57 mg/dL — AB (ref 0.61–1.24)
GFR calc Af Amer: 18 mL/min — ABNORMAL LOW (ref >60)
GFR, EST NON AFRICAN AMERICAN: 15 mL/min — AB (ref >60)
Glucose, Bld: 95 mg/dL (ref 65–99)
PHOSPHORUS: 4.2 mg/dL (ref 2.5–4.6)
Potassium: 4.1 mmol/L (ref 3.5–5.1)
Sodium: 137 mmol/L (ref 135–145)

## 2015-02-27 LAB — URINALYSIS W MICROSCOPIC (NOT AT ARMC)
GLUCOSE, UA: NEGATIVE mg/dL
Ketones, ur: 15 mg/dL — AB
Nitrite: NEGATIVE
PH: 7 (ref 5.0–8.0)
Protein, ur: >300 mg/dL — AB
SPECIFIC GRAVITY, URINE: 1.016 (ref 1.005–1.030)
UROBILINOGEN UA: 1 mg/dL (ref 0.0–1.0)

## 2015-02-27 LAB — CBC
HEMATOCRIT: 32.4 % — AB (ref 39.0–52.0)
HEMOGLOBIN: 10.5 g/dL — AB (ref 13.0–17.0)
MCH: 34.7 pg — ABNORMAL HIGH (ref 26.0–34.0)
MCHC: 32.4 g/dL (ref 30.0–36.0)
MCV: 106.9 fL — ABNORMAL HIGH (ref 78.0–100.0)
Platelets: 155 10*3/uL (ref 150–400)
RBC: 3.03 MIL/uL — ABNORMAL LOW (ref 4.22–5.81)
RDW: 17.4 % — ABNORMAL HIGH (ref 11.5–15.5)
WBC: 14 10*3/uL — AB (ref 4.0–10.5)

## 2015-02-27 LAB — HEPATITIS B CORE ANTIBODY, TOTAL: Hep B Core Total Ab: NEGATIVE

## 2015-02-27 LAB — PROTIME-INR
INR: 2.6 — AB (ref 0.00–1.49)
Prothrombin Time: 27.5 seconds — ABNORMAL HIGH (ref 11.6–15.2)

## 2015-02-27 LAB — HEPATITIS B SURFACE ANTIGEN: HEP B S AG: NEGATIVE

## 2015-02-27 LAB — HEPATITIS B SURFACE ANTIBODY,QUALITATIVE: Hep B S Ab: NONREACTIVE

## 2015-02-27 NOTE — Progress Notes (Signed)
Per MD- patient is not medically stable for d/c today; possibly tomorrow.  Current plan is for d/c to [facility].  Per handoff report- CSW spoke with Claiborne Billings- Admissions at [facility] who indicated that she does not have any vacancies at this time.  Not expecting anything until later next week.  Plan d/c to [facility] when medically stable per MD.  Butch Penny T. Pauline Good, [place]

## 2015-02-27 NOTE — Progress Notes (Signed)
[facility] Progress Note  Assessment/Plan: 1. Severe AS = SP TAVR/transapical approach 02/16/15.  2. Pulm edema/ vol excess - trying to challenge weight slowly with each HD. 3 liter goal Friday with net UF 2.5 and post weight of 76.5- he said weight was standing and is below edw and still with sig LE edema so EDW needs to be lowered 3. Post procedure delerium - issues with confusion off/on. Today seems clearer, but I get from wife, not quite at baseline 4. Hypotension - Off pressors (10/23). Pressures generally on the lower side. Remains on dilt 180 and BID metoprolol -  BP highly variable; Had BP drop into the 60s last night post HD given IVF - per d/w Dr. Lorrene Reid - plan to hold on further HD/UF until probably Monday, but will assess Sunday.  5. Afib - on po dilt and MTP- may affect BP and limit volume removal- may need dose reduction, coumadin 6. ESRD - CRRT stopped 10/23. Transitioned back to HD 10/25 Last HD Friday. Evaluate Sunday for HD on Sunday or wait until Monday - most volume is in LE 7. Anemia - Hb 9.4 10/26. Up to 10.5 10/29 Not on ESA. Low TSat. Fe load with HD X 10 doses started 10/26. 8. Thrombocytopenia - felt post op related. 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 Reduced Renvela.2->1 with meals due to low phos of 2.8. Now 4.2 Diet in hospital likely more restrictive  Pt was on daily calcitriol. Not on med list from HD and does not receive at the kidney center so  discontinued 10. Hx CAD/ CM 11. Disposition - Anticipate SNF [facility] when BPs stable pre and post HD  Myriam Jacobson, PA-C [facility] [phone] 02/27/2015,11:20 AM  LOS: 11 days    I have seen and examined this patient and agree with plan and assessment in the above note of M Bergman. His MS is still waxing and waning. Despite tolerating HD treatment yesterday with 2.5 liters UF off, almost immediately on return to the nursing unit had a symptomatic drop in BP to the 60's  (it is not recorded but I was called by a nurse and he rec'd a 250 cc bolus for hypotension). So I am not electing to try to take him to HD for more fluid removal today - will reassess tomorrow.  Discussed with Dr. Roxy Manns who agrees the pt remains not quite ready for discharge to SNF.  Quint Chestnut B,MD 02/27/2015 11:57 AM  Subjective:   No c/o  Objective Filed Vitals:   02/26/15 1433 02/26/15 1448 02/26/15 2106 02/27/15 0515  BP: 94/52 128/57 120/51 114/63  Pulse:   81 79  Temp:   98.3 F (36.8 C) 97.9 F (36.6 C)  TempSrc:   Oral Oral  Resp:   16 16  Height:      Weight:    76.295 kg (168 lb 3.2 oz)  SpO2:   95% 92%   Physical Exam General: NAD sitting in recliner Heart: irreg irreg  Lungs: clear except - short insp wheeze right base Abdomen: soft NT Extremities: ++ pitting LE edema Dialysis Access: left upper AVF + bruit  Dialysis prescription [facility]  MWF  4h  77kg Will have lower EDW at discharge 3k/2.0 [facility]  Hep 4000  LUA AVF Venofer 50 q wed Other op lab hgb 12.5 oct 14 , ca 9.2 phos 6.1 pth 213   Additional Objective Labs: Basic Metabolic Panel:  Recent Labs Lab 02/25/15 0226 02/26/15 1478  02/27/15 0249  NA 135 130* 137  K 4.1 4.1 4.1  CL 98* 94* 96*  CO2 26 24 27   GLUCOSE 107* 139* 95  BUN 26* 50* 29*  CREATININE 3.19* 4.91* 3.57*  CALCIUM 9.2 9.0 9.0  PHOS 3.2 4.8* 4.2   Liver Function Tests:  Recent Labs Lab 02/25/15 0226 02/26/15 0821 02/27/15 0249  ALBUMIN 3.0* 2.9* 3.0*   CBC:  Recent Labs Lab 02/22/15 0330 02/23/15 0538 02/24/15 0224 02/26/15 0821 02/27/15 0249  WBC 8.7 9.9 11.5* 12.0* 14.0*  NEUTROABS  --   --   --  8.6*  --   HGB 9.5* 9.8* 9.4* 9.7* 10.5*  HCT 30.1* 30.8* 29.1* 29.0* 32.4*  MCV 109.5* 109.2* 107.8* 106.6* 106.9*  PLT 67* 77* 102* 190 155  CBG:  Recent Labs Lab 02/24/15 1639  GLUCAP 78   Medications: . sodium chloride 10 mL/hr (02/21/15 2112)   . antiseptic oral rinse  7 mL  Mouth Rinse BID  . aspirin EC  81 mg Oral Daily  . diltiazem  180 mg Oral QHS  . ferric gluconate (FERRLECIT/NULECIT) IV  125 mg Intravenous Q M,W,F-HD  . lipase/protease/amylase  48,000 Units Oral BID WC  . loperamide  2 mg Oral BID  . metoprolol tartrate  12.5 mg Oral BID  . multivitamin  1 tablet Oral QHS  . pantoprazole  40 mg Oral Daily  . sevelamer carbonate  800 mg Oral TID WC  . sodium chloride  10-40 mL Intracatheter Q12H  . sodium chloride  3 mL Intravenous Q12H  . warfarin  2.5 mg Oral q1800  . Warfarin - Physician Dosing Inpatient   Does not apply [phone]

## 2015-02-27 NOTE — Care Management Important Message (Signed)
Important Message  Patient Details  Name: [name] MRN: [id number] Date of Birth: Mar 02, 1941   Medicare Important Message Given:  Yes-third notification given    Norina Buzzard, RN 02/27/2015, 11:48 AM

## 2015-02-27 NOTE — Progress Notes (Addendum)
      [address]       [place] [address]             [phone]        11 Days Post-Op Procedure(s) (LRB): TRANSCATHETER AORTIC VALVE REPLACEMENT, TRANSAPICAL (N/A) TRANSESOPHAGEAL ECHOCARDIOGRAM (TEE) (N/A)  Subjective: Patient with complaints of shortness of breath, but has had  Objective: Vital signs in last 24 hours: Temp:  [97.9 F (36.6 C)-98.3 F (36.8 C)] 97.9 F (36.6 C) (10/29 0515) Pulse Rate:  [72-96] 79 (10/29 0515) Cardiac Rhythm:  [-] Atrial fibrillation (10/29 0800) Resp:  [16-18] 16 (10/29 0515) BP: (69-128)/(49-64) 114/63 mmHg (10/29 0515) SpO2:  [92 %-95 %] 92 % (10/29 0515) Weight:  [168 lb 3.2 oz (76.295 kg)-168 lb 10.4 oz (76.5 kg)] 168 lb 3.2 oz (76.295 kg) (10/29 0515)   Current Weight  02/27/15 168 lb 3.2 oz (76.295 kg)      Intake/Output from previous day: 10/28 0701 - 10/29 0700 In: 700 [P.O.:480; IV Piggyback:220] Out: 2800 [Urine:300]   Physical Exam:  Cardiovascular: IRRR IRRR  Pulmonary: Mostly clear this am Abdomen: Soft, non tender, bowel sounds present. Extremities: Bilateral lower extremity edema. Wound: Clean and dry.  No erythema or signs of infection.   Lab Results: CBC:  Recent Labs  02/26/15 0821 02/27/15 0249  WBC 12.0* 14.0*  HGB 9.7* 10.5*  HCT 29.0* 32.4*  PLT 190 155   BMET:   Recent Labs  02/26/15 0821 02/27/15 0249  NA 130* 137  K 4.1 4.1  CL 94* 96*  CO2 24 27  GLUCOSE 139* 95  BUN 50* 29*  CREATININE 4.91* 3.57*  CALCIUM 9.0 9.0    PT/INR:  Lab Results  Component Value Date   INR 2.60* 02/27/2015   INR 2.54* 02/26/2015   INR 2.53* 02/26/2015   ABG:  INR: Will add last result for INR, ABG once components are confirmed Will add last 4 CBG results once components are confirmed  Assessment/Plan:  1. CV - Persistent a fib with CVR. On Diltaizem CD 180 at hs, Lopressor 12.5 mg bid, and Coumadin 2.5 mg daily. INR today 2.6. Will continue with 2.5 mg of  Coumadin. 2.  Pulmonary - On room air . Encourage incentive spirometer. 3. Chronic anemia - H and H stable at 10.5 and 32.4. On Nulecit 4.ESRD- Had HD yesterday and became very hypotensive afterwards. Creatinine down to 3.57. 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 Post op delirium resolved 7. Leukocytosis-WBC increased slightly to 14,000 but remains afebrile. Check CXR in am and UA. No signs of anterior wound infection. 8. To SNF hopefully Monday  ZIMMERMAN,DONIELLE MPA-C 02/27/2015,8:16 AM   I have seen and examined the patient and agree with the assessment and plan as outlined.  Discussed w/ Dr Lorrene Reid.  Will see how he does tomorrow and Monday.  If he tolerates HD well on Monday then d/c to SNF  [name], MD 02/27/2015 12:51 PM

## 2015-02-28 ENCOUNTER — Inpatient Hospital Stay (HOSPITAL_COMMUNITY): Payer: MEDICARE

## 2015-02-28 LAB — CBC
HCT: 33 % — ABNORMAL LOW (ref 39.0–52.0)
HEMOGLOBIN: 10.4 g/dL — AB (ref 13.0–17.0)
MCH: 34.4 pg — ABNORMAL HIGH (ref 26.0–34.0)
MCHC: 31.5 g/dL (ref 30.0–36.0)
MCV: 109.3 fL — ABNORMAL HIGH (ref 78.0–100.0)
PLATELETS: 227 10*3/uL (ref 150–400)
RBC: 3.02 MIL/uL — AB (ref 4.22–5.81)
RDW: 17.5 % — ABNORMAL HIGH (ref 11.5–15.5)
WBC: 13.4 10*3/uL — ABNORMAL HIGH (ref 4.0–10.5)

## 2015-02-28 LAB — PROTIME-INR
INR: 2.93 — AB (ref 0.00–1.49)
PROTHROMBIN TIME: 30.1 s — AB (ref 11.6–15.2)

## 2015-02-28 MED ORDER — WARFARIN SODIUM 1 MG PO TABS
1.0000 mg | ORAL_TABLET | Freq: Every day | ORAL | Status: DC
  Administered 2015-02-28 – 2015-03-01 (×2): 1 mg via ORAL
  Filled 2015-02-28 (×2): qty 1

## 2015-02-28 NOTE — Progress Notes (Signed)
[facility] Progress Note  Events:  No new events past 24 hours  Assessment/Plan: 1. Severe AS = SP TAVR/transapical approach 02/16/15 by Dr. Roxy Manns.  2. Pulm edema/ vol excess - trying to challenge weight slowly with each HD. 3 liter goal Friday with net UF 2.5 and post weight of 76.5- he said weight was standing and is below edw and still with sig LE edema so EDW needs to be lowered GENTLY. For HD tomorrow and will try for weight of 76 kg on standup scales.  3. Post procedure delerium - issues with confusion off/on. Seems to me to be at baseline today.   4. Hypotension - Pressures generally on the lower side. Remains on dilt 180 and BID metoprolol -  BP highly variable; Had BP drop into the 60s post HD on Friday requiring fluid bolus.   5. Afib - on po dilt and MTP- may affect BP and limit volume removal- may need dose reduction; coumadin dosed by Dr. Roxy Manns 6. ESRD - CRRT stopped 10/23. Transitioned back to HD 10/25 Last HD Friday. For HD first round in the AM. Most volume is in LE's though most recent CXR mild interstitial edema. Remains on room air and lungs today are clear.  7. Anemia - Hb 9.4 10/26. Up to 10.5 10/29 Not on ESA. Low TSat. Fe load with HD X 10 doses started 10/26. 8. Thrombocytopenia - felt post op related. 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 Reduced Renvela.2->1 with meals due to low phos of 2.8. Now 4.2 Diet in hospital likely more restrictive  Pt was on daily calcitriol. Not on med list from HD and does not receive at the kidney center so  discontinued 10. Hx CAD/ CM 11. Disposition - Anticipate SNF [facility] when BPs stable pre and post HD. Will need plan in place for dosing of coumadin - who will monitor? (cannot be monitored at the HD unit)  Jamal Maes, MD [place] Pager 02/28/2015, 10:03 AM  Subjective:   No c/o Says "ready to get out of this place"  Objective Filed Vitals:   02/27/15 0515 02/27/15 1352 02/27/15  2013 02/28/15 0505  BP: 114/63 119/57 120/59 117/62  Pulse: 79 82 85 82  Temp: 97.9 F (36.6 C) 98 F (36.7 C) 98.1 F (36.7 C) 98.2 F (36.8 C)  TempSrc: Oral Oral Oral Oral  Resp: 16 17 20 18   Height:      Weight: 76.295 kg (168 lb 3.2 oz)   76.975 kg (169 lb 11.2 oz)  SpO2: 92% 94% 94% 94%   Physical Exam General: NAD  VS as noted Heart: irreg irreg S1S2 No S3  AVF bruit heard across chest Lungs: Clear posteriorly Abdomen: soft NT No sacral edema Extremities: 2+ pitting LE edema both LE's.  Dialysis Access: left upper AVF + bruit + bruising proximally and distally  Dialysis prescription [facility]  MWF  4h  77kg Will have lower EDW at discharge 3k/2.0 [place]  Hep 4000  LUA AVF Venofer 50 q wed Other op lab hgb 12.5 oct 14 , ca 9.2 phos 6.1 pth 213   Additional Objective Labs: Basic Metabolic Panel:  Recent Labs Lab 02/25/15 0226 02/26/15 0821 02/27/15 0249  NA 135 130* 137  K 4.1 4.1 4.1  CL 98* 94* 96*  CO2 26 24 27   GLUCOSE 107* 139* 95  BUN 26* 50* 29*  CREATININE 3.19* 4.91* 3.57*  CALCIUM 9.2 9.0 9.0  PHOS 3.2 4.8* 4.2  Liver Function Tests:  Recent Labs Lab 02/25/15 0226 02/26/15 0821 02/27/15 0249  ALBUMIN 3.0* 2.9* 3.0*   CBC:  Recent Labs Lab 02/23/15 0538 02/24/15 0224 02/26/15 0821 02/27/15 0249 02/28/15 0330  WBC 9.9 11.5* 12.0* 14.0* 13.4*  NEUTROABS  --   --  8.6*  --   --   HGB 9.8* 9.4* 9.7* 10.5* 10.4*  HCT 30.8* 29.1* 29.0* 32.4* 33.0*  MCV 109.2* 107.8* 106.6* 106.9* 109.3*  PLT 77* 102* 190 155 227  CBG:  Recent Labs Lab 02/24/15 1639  GLUCAP 78   Medications: . sodium chloride 10 mL/hr (02/21/15 2112)   . antiseptic oral rinse  7 mL Mouth Rinse BID  . aspirin EC  81 mg Oral Daily  . diltiazem  180 mg Oral QHS  . ferric gluconate (FERRLECIT/NULECIT) IV  125 mg Intravenous Q M,W,F-HD  . lipase/protease/amylase  48,000 Units Oral BID WC  . loperamide  2 mg Oral BID  . metoprolol tartrate   12.5 mg Oral BID  . multivitamin  1 tablet Oral QHS  . pantoprazole  40 mg Oral Daily  . sevelamer carbonate  800 mg Oral TID WC  . sodium chloride  10-40 mL Intracatheter Q12H  . sodium chloride  3 mL Intravenous Q12H  . warfarin  1 mg Oral q1800  . Warfarin - Physician Dosing Inpatient   Does not apply [phone]

## 2015-02-28 NOTE — Progress Notes (Addendum)
      [address]       [place] [address]             [phone]        12 Days Post-Op Procedure(s) (LRB): TRANSCATHETER AORTIC VALVE REPLACEMENT, TRANSAPICAL (N/A) TRANSESOPHAGEAL ECHOCARDIOGRAM (TEE) (N/A)  Subjective: Patient without specific complaint this am. He states he is tired and is going to take a nap, while he waits for his wife.  Objective: Vital signs in last 24 hours: Temp:  [98 F (36.7 C)-98.2 F (36.8 C)] 98.2 F (36.8 C) (10/30 0505) Pulse Rate:  [82-85] 82 (10/30 0505) Cardiac Rhythm:  [-] Atrial fibrillation (10/30 0700) Resp:  [17-20] 18 (10/30 0505) BP: (117-120)/(57-62) 117/62 mmHg (10/30 0505) SpO2:  [94 %] 94 % (10/30 0505) Weight:  [169 lb 11.2 oz (76.975 kg)] 169 lb 11.2 oz (76.975 kg) (10/30 0505)   Current Weight  02/28/15 169 lb 11.2 oz (76.975 kg)      Intake/Output from previous day: 10/29 0701 - 10/30 0700 In: 720 [P.O.:720] Out: 205 [Urine:205]   Physical Exam:  Cardiovascular: IRRR IRRR  Pulmonary: Clear on right and diminished on left Abdomen: Soft, non tender, bowel sounds present. Extremities: Bilateral lower extremity edema. Wound: Clean and dry.  No erythema or signs of infection.   Lab Results: CBC:  Recent Labs  02/27/15 0249 02/28/15 0330  WBC 14.0* 13.4*  HGB 10.5* 10.4*  HCT 32.4* 33.0*  PLT 155 227   BMET:   Recent Labs  02/26/15 0821 02/27/15 0249  NA 130* 137  K 4.1 4.1  CL 94* 96*  CO2 24 27  GLUCOSE 139* 95  BUN 50* 29*  CREATININE 4.91* 3.57*  CALCIUM 9.0 9.0    PT/INR:  Lab Results  Component Value Date   INR 2.93* 02/28/2015   INR 2.60* 02/27/2015   INR 2.54* 02/26/2015   ABG:  INR: Will add last result for INR, ABG once components are confirmed Will add last 4 CBG results once components are confirmed  Assessment/Plan:  1. CV - Persistent a fib with CVR. On Diltaizem CD 180 at hs, Lopressor 12.5 mg bid, and Coumadin 2.5 mg daily. INR increased from  2.6 to 2.93. Will give 1 mg Coumadin tonight. 2.  Pulmonary - On room air . CXR this am shows cardiomegaly, small bilateral pleural effusions, no pneumothorax, mild interstitial edema.Encourage incentive spirometer. 3. Chronic anemia - H and H stable at 10.4 and 33. On Nulecit 4.ESRD- Had HD yesterday and became very hypotensive afterwards. Creatinine yesterday down to 3.57. HD in am 5. MBD-on Renvela 6. Post op delirium resolved 7. Leukocytosis-WBC decreased slightly to 13,400 but remains afebrile. No signs of wound infection. 8. If tolerates HD well, hopefully to SNF [facility]) tomorrow afternoon  ZIMMERMAN,DONIELLE MPA-C 02/28/2015,8:43 AM     I have seen and examined the patient and agree with the assessment and plan as outlined.  [name], MD 02/28/2015 1:32 PM

## 2015-03-01 DIAGNOSIS — I350 Nonrheumatic aortic (valve) stenosis: Secondary | ICD-10-CM

## 2015-03-01 LAB — RENAL FUNCTION PANEL
ALBUMIN: 2.8 g/dL — AB (ref 3.5–5.0)
Anion gap: 13 (ref 5–15)
BUN: 65 mg/dL — AB (ref 6–20)
CALCIUM: 9 mg/dL (ref 8.9–10.3)
CO2: 25 mmol/L (ref 22–32)
CREATININE: 5.41 mg/dL — AB (ref 0.61–1.24)
Chloride: 97 mmol/L — ABNORMAL LOW (ref 101–111)
GFR calc Af Amer: 11 mL/min — ABNORMAL LOW (ref >60)
GFR, EST NON AFRICAN AMERICAN: 9 mL/min — AB (ref >60)
Glucose, Bld: 103 mg/dL — ABNORMAL HIGH (ref 65–99)
PHOSPHORUS: 6.6 mg/dL — AB (ref 2.5–4.6)
POTASSIUM: 4.1 mmol/L (ref 3.5–5.1)
SODIUM: 135 mmol/L (ref 135–145)

## 2015-03-01 LAB — PROTIME-INR
INR: 2.96 — AB (ref 0.00–1.49)
PROTHROMBIN TIME: 30.3 s — AB (ref 11.6–15.2)

## 2015-03-01 LAB — CBC
HEMATOCRIT: 30.8 % — AB (ref 39.0–52.0)
Hemoglobin: 9.9 g/dL — ABNORMAL LOW (ref 13.0–17.0)
MCH: 34.4 pg — ABNORMAL HIGH (ref 26.0–34.0)
MCHC: 32.1 g/dL (ref 30.0–36.0)
MCV: 106.9 fL — ABNORMAL HIGH (ref 78.0–100.0)
PLATELETS: 254 10*3/uL (ref 150–400)
RBC: 2.88 MIL/uL — ABNORMAL LOW (ref 4.22–5.81)
RDW: 17.5 % — AB (ref 11.5–15.5)
WBC: 13.4 10*3/uL — AB (ref 4.0–10.5)

## 2015-03-01 MED ORDER — ASPIRIN 81 MG PO TBEC: 81.0000 mg | DELAYED_RELEASE_TABLET | Freq: Every day | ORAL | Status: DC

## 2015-03-01 MED ORDER — METOPROLOL TARTRATE 25 MG PO TABS: 12.5000 mg | ORAL_TABLET | Freq: Two times a day (BID) | ORAL | Status: DC

## 2015-03-01 MED ORDER — SODIUM CHLORIDE 0.9 % IV SOLN: 125.0000 mg | INTRAVENOUS | Status: DC

## 2015-03-01 MED ORDER — SEVELAMER CARBONATE 800 MG PO TABS
1600.0000 mg | ORAL_TABLET | Freq: Three times a day (TID) | ORAL | Status: DC
  Administered 2015-03-01: 1600 mg via ORAL
  Filled 2015-03-01 (×3): qty 2

## 2015-03-01 MED ORDER — WARFARIN SODIUM 2 MG PO TABS
2.0000 mg | ORAL_TABLET | Freq: Every day | ORAL | Status: DC
Start: 2015-03-01 — End: 2015-03-05

## 2015-03-01 NOTE — Progress Notes (Signed)
Pt. Back from dialysis in bathroom at present, heart rate tachy in the 140's. Will continue to monitor.

## 2015-03-01 NOTE — Progress Notes (Signed)
Physical Therapy Treatment Patient Details Name: [name] MRN: [id number] DOB: 24-Mar-1941 Today's Date: 03/01/2015    History of Present Illness Patient is a 74 y/o male presents with severe aortic stenosis s/p TAVR. 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    PT Comments    Improving steadily.  Fatigues fairly quickly, but with quick recovery during standing rest.  Still in afib with ranges between 90's and 110's  Follow Up Recommendations  SNF     Equipment Recommendations  Rolling walker with 5" wheels    Recommendations for Other Services       Precautions / Restrictions Precautions Precautions: Fall Restrictions Weight Bearing Restrictions: No    Mobility  Bed Mobility               General bed mobility comments: Sitting in recliner upon PT arrival.   Transfers Overall transfer level: Needs assistance Equipment used: None Transfers: Sit to/from Stand Sit to Stand: Supervision            Ambulation/Gait Ambulation/Gait assistance: Supervision Ambulation Distance (Feet): 400 Feet Assistive device: Rolling walker (2 wheeled) Gait Pattern/deviations: Step-through pattern Gait velocity: increased and safe with RW Gait velocity interpretation: at or above normal speed for age/gender General Gait Details: steady with RW  (used due to just recently back from HD)  Stood much too far back from the RW  EHR was labile from low 90's to 110's in afib.  Unable to get a sat reading on 3-4 occasions.   Stairs            Wheelchair Mobility    Modified Rankin (Stroke Patients Only)       Balance     Sitting balance-Leahy Scale: Good       Standing balance-Leahy Scale: Fair                      Cognition Arousal/Alertness: Awake/alert Behavior During Therapy: WFL for tasks assessed/performed Overall Cognitive Status: Within Functional Limits for tasks assessed                       Exercises      General Comments        Pertinent Vitals/Pain Pain Assessment: No/denies pain    Home Living                      Prior Function            PT Goals (current goals can now be found in the care plan section) Acute Rehab PT Goals Patient Stated Goal: to go home and return to playing golf PT Goal Formulation: With patient Time For Goal Achievement: 03/09/15 Potential to Achieve Goals: Fair Progress towards PT goals: Progressing toward goals    Frequency  Min 3X/week    PT Plan Current plan remains appropriate    Co-evaluation             End of Session   Activity Tolerance: Patient tolerated treatment well Patient left: in chair;with call bell/phone within reach     Time: 5427-0623 PT Time Calculation (min) (ACUTE ONLY): 17 min  Charges:  $Gait Training: 8-22 mins                    G Codes:      Ebelin Dillehay, Tessie Fass 03/01/2015, 1:35 PM 03/01/2015  Donnella Sham, PT [phone]  (pager)

## 2015-03-01 NOTE — Progress Notes (Signed)
Discharged with instructions, voiced understanding. Discharge papers given to ambulance staff.

## 2015-03-01 NOTE — Progress Notes (Signed)
Patient will discharge to [facility] Anticipated discharge date:03/01/15 Family notified: wife Transportation by [employer]- scheduled for 4pm  CSW signing off.  Domenica Reamer, [place] Social Worker [phone]

## 2015-03-01 NOTE — Progress Notes (Signed)
Removed CT sutures and applied benzoin and 1/2 " steri strips.  Pt tolerated procedure well. Pt given signs and symptoms of infection. Seairra Otani McClintock, RN    

## 2015-03-01 NOTE — Progress Notes (Signed)
Utilization review completed.  

## 2015-03-01 NOTE — Progress Notes (Addendum)
      [address]       [place] [address]             [phone]        13 Days Post-Op Procedure(s) (LRB): TRANSCATHETER AORTIC VALVE REPLACEMENT, TRANSAPICAL (N/A) TRANSESOPHAGEAL ECHOCARDIOGRAM (TEE) (N/A)  Subjective: Patient without specific complaint this am. He really wants to go to SNF  Objective: Vital signs in last 24 hours: Temp:  [97.3 F (36.3 C)-97.7 F (36.5 C)] 97.3 F (36.3 C) (10/31 1121) Pulse Rate:  [76-110] 110 (10/31 1121) Cardiac Rhythm:  [-] Atrial fibrillation (10/31 0700) Resp:  [15-24] 20 (10/31 1121) BP: (106-145)/(54-86) 142/68 mmHg (10/31 1121) SpO2:  [91 %-99 %] 96 % (10/31 1121) Weight:  [167 lb 15.9 oz (76.2 kg)-171 lb 8 oz (77.792 kg)] 167 lb 15.9 oz (76.2 kg) (10/31 1121)   Current Weight  03/01/15 167 lb 15.9 oz (76.2 kg)      Intake/Output from previous day: 10/30 0701 - 10/31 0700 In: 720 [P.O.:720] Out: -    Physical Exam:  Cardiovascular: [facility]  Pulmonary: Slightly diminished at bases Abdomen: Soft, non tender, bowel sounds present. Extremities: Bilateral lower extremity edema (mid calf down) Wound: Clean and dry.  No erythema or signs of infection.   Lab Results: CBC:  Recent Labs  02/28/15 0330 03/01/15 0723  WBC 13.4* 13.4*  HGB 10.4* 9.9*  HCT 33.0* 30.8*  PLT 227 254   BMET:   Recent Labs  02/27/15 0249 03/01/15 0723  NA 137 135  K 4.1 4.1  CL 96* 97*  CO2 27 25  GLUCOSE 95 103*  BUN 29* 65*  CREATININE 3.57* 5.41*  CALCIUM 9.0 9.0    PT/INR:  Lab Results  Component Value Date   INR 2.96* 03/01/2015   INR 2.93* 02/28/2015   INR 2.60* 02/27/2015   ABG:  INR: Will add last result for INR, ABG once components are confirmed Will add last 4 CBG results once components are confirmed  Assessment/Plan:  1. CV - Persistent a fib with CVR. Had NSVT 12 beat run in HD. Appears to have had intermittent block.On Diltaizem CD 180 at hs, Lopressor 12.5 mg bid, and Coumadin  2.5 mg daily. INR 2.96 this am. As discussed with Dr. Roxy Manns, will give 2 mg daily (or as directed in SNF). 2.  Pulmonary - On room air . ncourage incentive spirometer. 3. Chronic anemia - H and H stable at 9.9 and 30.8 On Nulecit 4.ESRD- Had HD yesterday and became very hypotensive afterwards. Creatinine 5.41 this am. Had HD earlier 5. MBD-on Renvela 6. As discussed with Dr. Roxy Manns, to SNF  ZIMMERMAN,DONIELLE MPA-C 03/01/2015,12:47 PM   I have seen and examined the patient and agree with the assessment and plan as outlined.  [name], MD 03/01/2015 4:09 PM

## 2015-03-01 NOTE — Progress Notes (Signed)
[facility] Progress Note  Assessment/Plan: 1. Severe AS = SP TAVR/transapical approach 02/16/15 by Dr. Roxy Manns.  2. Pulm edema/ vol excess - trying to challenge weight slowly with each HD. 3 liter goal Friday with net UF 2.5 and post weight of 76.5- goal today set to attain EDW of 76 3. Post procedure delerium - issues with confusion off/on. Seems to me to be at baseline today.  4. Hypotension - Pressures generally on the lower side. Remains on dilt 180 and BID metoprolol - BP highly variable; Had BP drop into the 60s post HD on Friday requiring fluid bolus. - conservative goal today - BP ok. Check orthostatics post HD today. 5. Afib - on po dilt and MTP- may affect BP and limit volume removal- may need dose reduction; coumadin dosed by Dr. Roxy Manns 6. ESRD - CRRT stopped 10/23. Transitioned back to HD 10/25 Last HD Friday. For HD first round in the AM. Most volume is in LE's though most recent CXR mild interstitial edema, though lungs clear - K 4.1 - have changed to 3 K 2.25 Ca for the remainder of treatment- need to avoid low K due to afib 7. Anemia - Hb 9.4 10/26. Up to 10.5 10/29 and 9.9 10/13  Not on ESA yet . Low TSat. Fe load with HD X 10 doses started 10/26. 8. Thrombocytopenia - felt post op related-resolved. 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 Reduced Renvela.2->1 with meals due to low phos of 2.8. Now 4.2 Diet in hospital likely more restrictive P up to 6s yesterday so will resume previous dose of 2  Pt was on daily calcitriol. Not on med list from HD and does not receive at the kidney center so discontinued 10. Hx CAD/ CM 11. Disposition - Anticipate SNF [facility] when BPs stable pre and post HD. Coumadin will be monitored at SNF but needs to be monitored by PCP or cards after d/c (will not be monitored at dialysis).    Myriam Jacobson, PA-C [facility] Beeper [phone] 03/01/2015,8:56 AM  LOS: 13 days   Pt seen, examined and agree w A/P as above.   Kelly Splinter MD [facility] pager [phone]    cell [phone] 03/01/2015, 12:10 PM    Subjective: Can't get comfortable in hospital bed  Objective Filed Vitals:   03/01/15 0712 03/01/15 0730 03/01/15 0800 03/01/15 0830  BP: 115/54 129/86 124/71 138/61  Pulse: 95 101 88 86  Temp:      TempSrc:      Resp: 24 20 18 15   Height:      Weight:      SpO2: 93%      Physical Exam pre HD weight 77.7 - goal 2.4 General: NAD on HD Heart: irreg irreg - rate 80s to 110s Lungs: no rales Abdomen: soft NT Extremities: ++ LE edema Dialysis Access: left upper AVF Qb 350  Dialysis Orders: [facility] MWF4h 77kg Will have lower EDW at discharge 3k/2.0 Ca Hep 4000 LUA AVFVenofer 50 q wed Other op lab hgb 12.5 oct 14 , ca 9.2 phos 6.1 pth 213  Additional Objective Labs: Basic Metabolic Panel:  Recent Labs Lab 02/26/15 0821 02/27/15 0249 03/01/15 0723  NA 130* 137 135  K 4.1 4.1 4.1  CL 94* 96* 97*  CO2 24 27 25   GLUCOSE 139* 95 103*  BUN 50* 29* 65*  CREATININE 4.91* 3.57* 5.41*  CALCIUM 9.0 9.0 9.0  PHOS 4.8* 4.2 6.6*   Liver Function Tests:  Recent Labs Lab 02/26/15 0821 02/27/15 0249 03/01/15 0723  ALBUMIN 2.9* 3.0* 2.8*   CBC:  Recent Labs Lab 02/24/15 0224 02/26/15 0821 02/27/15 0249 02/28/15 0330 03/01/15 0723  WBC 11.5* 12.0* 14.0* 13.4* 13.4*  NEUTROABS  --  8.6*  --   --   --   HGB 9.4* 9.7* 10.5* 10.4* 9.9*  HCT 29.1* 29.0* 32.4* 33.0* 30.8*  MCV 107.8* 106.6* 106.9* 109.3* 106.9*  PLT 102* 190 155 227 254   CBG:  Recent Labs Lab 02/24/15 1639  GLUCAP 78    Studies/Results: Dg Chest 2 View  02/28/2015  CLINICAL DATA:  CHF, post heart surgery, SOB EXAM: CHEST - 2 VIEW COMPARISON:  02/24/2015 FINDINGS: Stable mild cardiomegaly. Atheromatous tortuous thoracic aorta. Changes of AVR. Small bilateral pleural effusions stable. Mild interstitial edema or infiltrates predominately in the lung bases, slightly increased. No  pneumothorax. Spurring in the lower thoracic spine. IMPRESSION: 1. Slight increase in bilateral interstitial edema or infiltrates. 2. Stable cardiomegaly and tiny effusions. Electronically Signed   By: Lucrezia Europe M.D.   On: 02/28/2015 08:13   Medications: . sodium chloride 10 mL/hr (02/21/15 2112)   . antiseptic oral rinse  7 mL Mouth Rinse BID  . aspirin EC  81 mg Oral Daily  . diltiazem  180 mg Oral QHS  . ferric gluconate (FERRLECIT/NULECIT) IV  125 mg Intravenous Q M,W,F-HD  . lipase/protease/amylase  48,000 Units Oral BID WC  . loperamide  2 mg Oral BID  . metoprolol tartrate  12.5 mg Oral BID  . multivitamin  1 tablet Oral QHS  . pantoprazole  40 mg Oral Daily  . sevelamer carbonate  800 mg Oral TID WC  . sodium chloride  10-40 mL Intracatheter Q12H  . sodium chloride  3 mL Intravenous Q12H  . warfarin  1 mg Oral q1800  . Warfarin - Physician Dosing Inpatient   Does not apply [phone]

## 2015-03-01 NOTE — Progress Notes (Signed)
Report given to [place] at [facility].

## 2015-03-01 NOTE — Care Management Note (Signed)
Case Management Note Marvetta Gibbons RN, BSN Unit 2W-Case Manager [phone]  Patient Details  Name: [name] MRN: [id number] Date of Birth: May 11, 1940  Subjective/Objective:     Pt admitted s/p TAVR               Action/Plan: PTA pt lived at home with spouse- hx ESRD- HD- M/W/F- spoke with pt and wife at bedside regarding d/c plans- per conversation wife will need to get back to work next week and pt will not have 24/7 assistance at home- discussed home with [facility] and private duty assistance. Per wife pt does have a long term care plan that might cover some of there expenses- wife would like to look at [facility] for rehab until pt stronger and able to stay by himself- first preference would be [facility]- pt is hesitant but agreeable- will consult CSW to f/u with possible placement.   Expected Discharge Date: 03/01/15               Expected Discharge Plan:  Skilled Nursing Facility  In-House Referral:  Clinical Social Work  Discharge planning Services  CM Consult  Post Acute Care Choice:    Choice offered to:     DME Arranged:    DME Agency:     HH Arranged:    [place] Agency:     Status of Service:  Completed, signed off  Medicare Important Message Given:  Yes-third notification given Date Medicare IM Given:    Medicare IM give by:    Date Additional Medicare IM Given:    Additional Medicare Important Message give by:     If discussed at [place] of Stay Meetings, dates discussed:  02/25/15  Additional Comments:  Dawayne Patricia, RN 03/01/2015, 1:45 PM

## 2015-03-01 NOTE — Discharge Instructions (Signed)
We ask the SNF to please do the following: 1. Please obtain vital signs at least one time daily 2.Please weigh the patient daily. If he or she continues to gain weight or develops lower extremity edema, contact the office at [phone]. 3. Ambulate patient at least three times daily and please use sternal precautions.  Activity: 1.May walk up steps                2.No lifting more than ten pounds for two weeks.                 3.No driving for two weeks.                4.Stop any activity that causes chest pain, shortness of breath, dizziness, sweating or excessive weakness.                5.Avoid straining.                6.Continue with your breathing exercises daily.  Diet: Renal, low fat, Low salt diet  Wound Care: May shower.  Clean wounds with mild soap and water daily. Contact the office at [phone] if any problems arise.

## 2015-03-04 ENCOUNTER — Encounter: Payer: Self-pay | Admitting: Internal Medicine

## 2015-03-04 ENCOUNTER — Non-Acute Institutional Stay (SKILLED_NURSING_FACILITY): Payer: MEDICARE | Admitting: Internal Medicine

## 2015-03-04 ENCOUNTER — Telehealth: Payer: Self-pay | Admitting: Cardiology

## 2015-03-04 DIAGNOSIS — Z954 Presence of other heart-valve replacement: Secondary | ICD-10-CM

## 2015-03-04 DIAGNOSIS — Z952 Presence of prosthetic heart valve: Secondary | ICD-10-CM

## 2015-03-04 DIAGNOSIS — I5032 Chronic diastolic (congestive) heart failure: Secondary | ICD-10-CM

## 2015-03-04 DIAGNOSIS — I421 Obstructive hypertrophic cardiomyopathy: Secondary | ICD-10-CM

## 2015-03-04 DIAGNOSIS — N186 End stage renal disease: Secondary | ICD-10-CM

## 2015-03-04 DIAGNOSIS — Z7901 Long term (current) use of anticoagulants: Secondary | ICD-10-CM

## 2015-03-04 DIAGNOSIS — I482 Chronic atrial fibrillation, unspecified: Secondary | ICD-10-CM

## 2015-03-04 DIAGNOSIS — Z992 Dependence on renal dialysis: Secondary | ICD-10-CM

## 2015-03-04 NOTE — Telephone Encounter (Signed)
Scheduled patient 12/6 with Richardson Dopp. Patient grateful for callback.

## 2015-03-04 NOTE — Progress Notes (Signed)
Patient ID: [name], male   DOB: 1941-01-21, 74 y.o.   MRN: [id number]    HISTORY AND PHYSICAL   DATE: 03/04/15 Location:  [facility]    Place of Service: SNF (31)   Extended Emergency Contact Information Primary Emergency Contact: [name] Address: [address]          [place], [place] [address] [name] Phone: [phone] Work Phone: [phone] Mobile Phone: [phone] Relation: Spouse  Advanced Directive information  DNR  Chief Complaint  Patient presents with  . New Admit To SNF    HPI:   74 yo male seen today as a new admission into SNF following hospital stay for severe AS, HOCM, chronic diastolic HF, HTN, ESRD on HD, chronic afib with hx NSVT on chronic coumadin, PAD and chronic diarrhea. He underwent AVR via trans cath approach on 10/21st. EF 55%. A1c 5.9%. He is on a 1500cc fluid restriction. Goal INR 2-2.5.   Today, he c/o left lateral CW pain at ribs. No SOB but has dry cough. No nursing issues. Appetite ok. Sleeping well. No f/c. No bleeding. He self caths via bladder stoma.  Past Medical History  Diagnosis Date  . Atrial fibrillation, persistent ([place])     a. cardioversion 11/21/10 . b. 05/2013 - experiencing more SOB. 24-hr holter showed avg HR 49, max 82, min 38bpm. ETT showed baseline junctional bradycardia, HR incr only to 90bpm with drop in BP. Saw EP - amiodarone reduced and eventually d/c'd with improvement in dyspnea.  . Hypertension   . Warfarin anticoagulation   . CKD (chronic kidney disease), stage V ([place])     a. Followed by Dr. Jimmy Footman. b. IV fistula placed 04/17/14. c. initiation of HD August 2016  . Aortic valve stenosis   . Orthostatic hypotension   . Diastolic dysfunction   . Arthritis   . Self-catheterizes urinary bladder   . Junctional bradycardia   . NSVT (nonsustained ventricular tachycardia) ([place])     a. By holter 01/2014.  [name] Shortness of breath dyspnea   . PVD (peripheral  vascular disease) ([place])   . Cardiomyopathy ([place])     non-ischemic  . RTA (renal tubular acidosis)   . Hypomagnesemia   . A-V fistula ([place])   . CAD (coronary artery disease) 12/16/14    a. cath - nonobstrucive CAD 30% pro RCA; 25% dis RCA; 40T pro Lcx  . Heart murmur   . Dysrhythmia   . CHF (congestive heart failure) ([place])   . Full dentures   . Uses hearing aid   . Chronic diarrhea     takes imodium BID  . Pneumonia     hx of  . Hemodialysis patient [facility])     M,W,F - dialysis initiated August 2016  . Bruises easily   . Bladder cancer ([place])     a. 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  . 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 Cardioversion  11/21/2010  . Cystectomy    . Cholecystectomy    . Cataract extraction w/ intraocular lens  implant, bilateral    . Colonoscopy    . Av fistula placement Left 04/17/2014    Procedure: ARTERIOVENOUS (AV) FISTULA CREATION- LEFT UPPER ARM ;  Surgeon: Mal [name], MD;  Location: [place];  Service:  Vascular;  Laterality: Left;  . Bladder surgery      radical cystectomy  . Vascular surgery    . Multiple tooth extractions    . Cardiac catheterization N/A 12/16/2014    Procedure: Right/Left Heart Cath and Coronary Angiography;  Surgeon: [name], MD;  Location: [place] CV LAB;  Service: Cardiovascular;  Laterality: N/A;  . Eye surgery    . Colon surgery  Nov 2002    reconstructed bladder from colon. per pt. at [facility]  . Tee without cardioversion N/A 01/01/2015    Procedure: TRANSESOPHAGEAL ECHOCARDIOGRAM (TEE);  Surgeon: [name], MD;  Location: [facility] ENDOSCOPY;  Service: Cardiovascular;  Laterality: N/A;  . Transcatheter aortic valve replacement, transapical N/A 02/16/2015    Procedure: TRANSCATHETER AORTIC VALVE REPLACEMENT, TRANSAPICAL;  Surgeon:  [name], MD;  Location: [place];  Service: Open Heart Surgery;  Laterality: N/A;  . Tee without cardioversion N/A 02/16/2015    Procedure: TRANSESOPHAGEAL ECHOCARDIOGRAM (TEE);  Surgeon: [name], MD;  Location: [place];  Service: Open Heart Surgery;  Laterality: N/A;    Patient Care Team: Deland Pretty, MD as PCP - General (Internal Medicine) Deland Pretty, MD as Consulting Physician (Internal Medicine) Mauricia Area, MD as Consulting Physician (Nephrology)  Social History   Social History  . Marital Status: Married    Spouse Name: N/A  . Number of Children: N/A  . Years of Education: N/A   Occupational History  . Not on file.   Social History Main Topics  . Smoking status: Former Smoker -- 2.00 packs/day for 33 years    Types: Cigarettes    Quit date: 05/02/1983  . Smokeless tobacco: Never Used  . Alcohol Use: Yes     Comment: rare  . Drug Use: No  . Sexual Activity: Not on file   Other Topics Concern  . Not on file   Social History Narrative     reports that he quit smoking about 31 years ago. His smoking use included Cigarettes. He has a 66 pack-year smoking history. He has never used smokeless tobacco. He reports that he drinks alcohol. He reports that he does not use illicit drugs.  Family History  Problem Relation Age of Onset  . CVA Mother   . Heart attack Father   . Heart disease Father   . Arrhythmia Father   . Alzheimer's disease Sister   . Multiple sclerosis Brother   . Alcohol abuse Father    Family Status  Relation Status Death Age  . Mother Deceased   . Father Deceased 1  . Sister Deceased   . Brother Deceased   . Brother Marketing executive History  Administered Date(s) Administered  . Influenza Split 01/29/2013  . Influenza-Unspecified 12/30/2013    Allergies  Allergen Reactions  . Amiodarone Other (See Comments)    Severely reduced DLCO  . Oxycodone Hcl Rash and Other (See Comments)    Rash and itching     Medications: Patient's Medications  New Prescriptions   No medications on file  Previous Medications   ACETAMINOPHEN (TYLENOL) 500 MG TABLET    Take 1,000 mg by mouth every 6 (six) hours as needed for mild pain or moderate pain.   ASPIRIN EC 81 MG EC TABLET    Take 1 tablet (81 mg total) by mouth daily.   DILTIAZEM (CARDIZEM CD) 180 MG 24 HR CAPSULE    Take 180 mg by mouth at bedtime.   FERRIC GLUCONATE 125 MG IN  SODIUM CHLORIDE 0.9 % 100 ML    Inject 125 mg into the vein every Monday, Wednesday, and Friday with hemodialysis.   HYPROMELLOSE (ARTIFICIAL TEARS OP)    Place 1 drop into both eyes daily as needed (rinse eyes).    LOPERAMIDE HCL (IMODIUM PO)    Take 1 tablet by mouth 2 (two) times daily.   METOPROLOL TARTRATE (LOPRESSOR) 25 MG TABLET    Take 0.5 tablets (12.5 mg total) by mouth 2 (two) times daily.   MULTIPLE VITAMINS-MINERALS (CENTRUM SILVER ULTRA MENS PO)    Take 1 tablet by mouth daily. Take daily   PANCRELIPASE, LIP-PROT-AMYL, (CREON) 24000 UNITS CPEP    Take 48,000 Units by mouth 2 (two) times daily with a meal.    SEVELAMER CARBONATE (RENVELA) 800 MG TABLET    1,600 mg 3 (three) times daily with meals.    WARFARIN (COUMADIN) 2 MG TABLET    Take 1 tablet (2 mg total) by mouth daily. Or take as directed by coumadin clinic  Modified Medications   No medications on file  Discontinued Medications   No medications on file    Review of Systems  Cardiovascular: Positive for chest pain and leg swelling.  All other systems reviewed and are negative.   Filed Vitals:   03/04/15 1443  BP: 135/78  Pulse: 140  Temp: 97.8 F (36.6 C)  Weight: 173 lb 9.6 oz (78.744 kg)  SpO2: 93%   Body mass index is 26.4 kg/(m^2).  Physical Exam  Constitutional: He is oriented to person, place, and time. He appears well-developed and well-nourished.  HENT:  Mouth/Throat: Oropharynx is clear and moist.  Eyes: Pupils are equal, round, and reactive to light. No scleral icterus.  Neck:  Neck supple. Carotid bruit is not present. No thyromegaly present.  Cardiovascular: Normal rate, normal heart sounds and intact distal pulses.  An irregularly irregular rhythm present. Exam reveals no gallop and no friction rub.   No murmur heard. Aortic click present. +1 pitting LE edema b/l. No calf TTP  Pulmonary/Chest: Effort normal and breath sounds normal. He has no wheezes. He has no rales. He exhibits tenderness (lateral CW incision healing. no d/c or redness. left rib 6-7 stuck down and TTP).  Abdominal: Soft. Bowel sounds are normal. He exhibits no distension, no abdominal bruit, no pulsatile midline mass and no mass. There is tenderness. There is no rebound and no guarding.  Right lower abdomen bandage intact. He has a stoma and self caths  Musculoskeletal: He exhibits edema and tenderness.  Lymphadenopathy:    He has no cervical adenopathy.  Neurological: He is alert and oriented to person, place, and time.  Skin: Skin is warm and dry. No rash noted.  Psychiatric: He has a normal mood and affect. His behavior is normal. Thought content normal.     Labs reviewed: Admission on 02/16/2015, Discharged on 03/01/2015  No results displayed because visit has over 200 results.    Hospital Outpatient Visit on 02/11/2015  Component Date Value Ref Range Status  . MRSA, PCR 02/11/2015 NEGATIVE  NEGATIVE Final  . Staphylococcus aureus 02/11/2015 NEGATIVE  NEGATIVE Final   Comment:        The Xpert SA Assay (FDA approved for NASAL specimens in patients over 40 years of age), is one component of a comprehensive surveillance program.  Test performance has been validated by [facility] for patients greater than or equal to 51 year old. It is not intended to diagnose infection nor to guide or  monitor treatment.   . ABO/RH(D) 02/11/2015 AB POS   Final  . Antibody Screen 02/11/2015 NEG   Final  . Sample Expiration 02/11/2015 02/25/2015   Final  . Unit Number 02/11/2015 [id number]    Final  . Blood Component Type 02/11/2015 RED CELLS,LR   Final  . Unit division 02/11/2015 00   Final  . Status of Unit 02/11/2015 REL FROM [facility]   Final  . Transfusion Status 02/11/2015 OK TO TRANSFUSE   Final  . Crossmatch Result 02/11/2015 Compatible   Final  . Unit Number 02/11/2015 [id number]   Final  . Blood Component Type 02/11/2015 RED CELLS,LR   Final  . Unit division 02/11/2015 00   Final  . Status of Unit 02/11/2015 REL FROM [facility]   Final  . Transfusion Status 02/11/2015 OK TO TRANSFUSE   Final  . Crossmatch Result 02/11/2015 Compatible   Final  . aPTT 02/11/2015 43* 24 - 37 seconds Final   Comment:        IF BASELINE aPTT IS ELEVATED, SUGGEST PATIENT RISK ASSESSMENT BE USED TO DETERMINE APPROPRIATE ANTICOAGULANT THERAPY.   [name] FIO2 02/11/2015 0.21   Final  . Delivery systems 02/11/2015 ROOM AIR   Final  . Mode 02/11/2015 ROOM AIR   Final  . pH, Arterial 02/11/2015 7.461* 7.350 - 7.450 Final  . pCO2 arterial 02/11/2015 37.4  35.0 - 45.0 mmHg Final  . pO2, Arterial 02/11/2015 78.0* 80.0 - 100.0 mmHg Final  . Bicarbonate 02/11/2015 26.2* 20.0 - 24.0 mEq/L Final  . TCO2 02/11/2015 27.4  0 - 100 mmol/L Final  . Acid-Base Excess 02/11/2015 2.7* 0.0 - 2.0 mmol/L Final  . O2 Saturation 02/11/2015 95.2   Final  . Patient temperature 02/11/2015 98.6   Final  . Collection site 02/11/2015 BRACHIAL ARTERY   Final  . Drawn by 02/11/2015 [id number]   Final  . Sample type 02/11/2015 ARTERIAL   Final  . Allens test (pass/fail) 02/11/2015 PASS  PASS Final  . WBC 02/11/2015 7.3  4.0 - 10.5 K/uL Final  . RBC 02/11/2015 3.38* 4.22 - 5.81 MIL/uL Final  . Hemoglobin 02/11/2015 11.8* 13.0 - 17.0 g/dL Final  . HCT 02/11/2015 35.9* 39.0 - 52.0 % Final  . MCV 02/11/2015 106.2* 78.0 - 100.0 fL Final  . MCH 02/11/2015 34.9* 26.0 - 34.0 pg Final  . MCHC 02/11/2015 32.9  30.0 - 36.0 g/dL Final  . RDW 02/11/2015 15.1  11.5 - 15.5 % Final  . Platelets 02/11/2015 172  150 - 400 K/uL Final  . Sodium  02/11/2015 138  135 - 145 mmol/L Final  . Potassium 02/11/2015 4.2  3.5 - 5.1 mmol/L Final  . Chloride 02/11/2015 99* 101 - 111 mmol/L Final  . CO2 02/11/2015 25  22 - 32 mmol/L Final  . Glucose, Bld 02/11/2015 99  65 - 99 mg/dL Final  . BUN 02/11/2015 36* 6 - 20 mg/dL Final  . Creatinine, Ser 02/11/2015 4.05* 0.61 - 1.24 mg/dL Final  . Calcium 02/11/2015 9.4  8.9 - 10.3 mg/dL Final  . Total Protein 02/11/2015 6.5  6.5 - 8.1 g/dL Final  . Albumin 02/11/2015 3.5  3.5 - 5.0 g/dL Final  . AST 02/11/2015 33  15 - 41 U/L Final  . ALT 02/11/2015 25  17 - 63 U/L Final  . Alkaline Phosphatase 02/11/2015 62  38 - 126 U/L Final  . Total Bilirubin 02/11/2015 0.9  0.3 - 1.2 mg/dL Final  . GFR calc non Af Amer 02/11/2015 13* >60 mL/min  Final  . GFR calc Af Amer 02/11/2015 15* >60 mL/min Final   Comment: (NOTE) The eGFR has been calculated using the CKD EPI equation. This calculation has not been validated in all clinical situations. eGFR's persistently <60 mL/min signify possible Chronic Kidney Disease.   . Anion gap 02/11/2015 14  5 - 15 Final  . Hgb A1c MFr Bld 02/11/2015 5.9* 4.8 - 5.6 % Final   Comment: (NOTE)         Pre-diabetes: 5.7 - 6.4         Diabetes: >6.4         Glycemic control for adults with diabetes: <7.0   . Mean Plasma Glucose 02/11/2015 123   Final   Comment: (NOTE) Performed At: [facility] [place], [place] [id number] Lindon Romp MD [id number]   . Prothrombin Time 02/11/2015 25.6* 11.6 - 15.2 seconds Final  . INR 02/11/2015 2.36* 0.00 - 1.49 Final  . Color, Urine 02/11/2015 BROWN* YELLOW Final   BIOCHEMICALS MAY BE AFFECTED BY COLOR  . APPearance 02/11/2015 TURBID* CLEAR Final  . Specific Gravity, Urine 02/11/2015 1.015  1.005 - 1.030 Final  . pH 02/11/2015 8.5* 5.0 - 8.0 Final  . Glucose, UA 02/11/2015 NEGATIVE  NEGATIVE mg/dL Final  . Hgb urine dipstick 02/11/2015 SMALL* NEGATIVE Final  . Bilirubin Urine 02/11/2015 NEGATIVE   NEGATIVE Final  . Ketones, ur 02/11/2015 15* NEGATIVE mg/dL Final  . Protein, ur 02/11/2015 >300* NEGATIVE mg/dL Final  . Urobilinogen, UA 02/11/2015 0.2  0.0 - 1.0 mg/dL Final  . Nitrite 02/11/2015 NEGATIVE  NEGATIVE Final  . Leukocytes, UA 02/11/2015 LARGE* NEGATIVE Final  . WBC, UA 02/11/2015 21-50  <3 WBC/hpf Final  . RBC / HPF 02/11/2015 3-6  <3 RBC/hpf Final  . Bacteria, UA 02/11/2015 MANY* RARE Final  . Crystals 02/11/2015 TRIPLE PHOSPHATE CRYSTALS* NEGATIVE Final  . Urine-Other 02/11/2015 AMORPHOUS URATES/PHOSPHATES   Final   URINALYSIS PERFORMED ON SUPERNATANT  Anti-coag visit on 02/04/2015  Component Date Value Ref Range Status  . INR 02/04/2015 2.5   Final  Anti-coag visit on 01/28/2015  Component Date Value Ref Range Status  . INR 01/28/2015 1.8   Final  Anti-coag visit on 01/21/2015  Component Date Value Ref Range Status  . INR 01/21/2015 2.2   Final  Anti-coag visit on 01/14/2015  Component Date Value Ref Range Status  . INR 01/14/2015 1.6   Final  Anti-coag visit on 01/07/2015  Component Date Value Ref Range Status  . INR 01/07/2015 1.5   Final  Hospital Outpatient Visit on 12/30/2014  Component Date Value Ref Range Status  . ABO/RH(D) 12/30/2014 AB POS   Final  . Antibody Screen 12/30/2014 NEG   Final  . Sample Expiration 12/30/2014 01/13/2015   Final  . aPTT 12/30/2014 29  24 - 37 seconds Final  . FIO2 12/30/2014 0.21   Final  . Delivery systems 12/30/2014 ROOM AIR   Final  . pH, Arterial 12/30/2014 7.418  7.350 - 7.450 Final  . pCO2 arterial 12/30/2014 37.9  35.0 - 45.0 mmHg Final  . pO2, Arterial 12/30/2014 70.8* 80.0 - 100.0 mmHg Final  . Bicarbonate 12/30/2014 24.0  20.0 - 24.0 mEq/L Final  . TCO2 12/30/2014 25.1  0 - 100 mmol/L Final  . Acid-base deficit 12/30/2014 0.0  0.0 - 2.0 mmol/L Final  . O2 Saturation 12/30/2014 93.1   Final  . Patient temperature 12/30/2014 98.6   Final  . Collection site 12/30/2014 RIGHT BRACHIAL   Final  .  Drawn by  12/30/2014 [id number]   Final  . Sample type 12/30/2014 ARTERIAL DRAW   Final  . Chauncey Reading test (pass/fail) 12/30/2014 PASS  PASS Final  . WBC 12/30/2014 6.5  4.0 - 10.5 K/uL Final  . RBC 12/30/2014 3.39* 4.22 - 5.81 MIL/uL Final  . Hemoglobin 12/30/2014 12.2* 13.0 - 17.0 g/dL Final  . HCT 12/30/2014 36.2* 39.0 - 52.0 % Final  . MCV 12/30/2014 106.8* 78.0 - 100.0 fL Final  . MCH 12/30/2014 36.0* 26.0 - 34.0 pg Final  . MCHC 12/30/2014 33.7  30.0 - 36.0 g/dL Final  . RDW 12/30/2014 17.0* 11.5 - 15.5 % Final  . Platelets 12/30/2014 187  150 - 400 K/uL Final  . Sodium 12/30/2014 138  135 - 145 mmol/L Final  . Potassium 12/30/2014 3.9  3.5 - 5.1 mmol/L Final  . Chloride 12/30/2014 101  101 - 111 mmol/L Final  . CO2 12/30/2014 27  22 - 32 mmol/L Final  . Glucose, Bld 12/30/2014 93  65 - 99 mg/dL Final  . BUN 12/30/2014 36* 6 - 20 mg/dL Final  . Creatinine, Ser 12/30/2014 3.99* 0.61 - 1.24 mg/dL Final  . Calcium 12/30/2014 8.9  8.9 - 10.3 mg/dL Final  . Total Protein 12/30/2014 6.8  6.5 - 8.1 g/dL Final  . Albumin 12/30/2014 3.5  3.5 - 5.0 g/dL Final  . AST 12/30/2014 31  15 - 41 U/L Final  . ALT 12/30/2014 25  17 - 63 U/L Final  . Alkaline Phosphatase 12/30/2014 49  38 - 126 U/L Final  . Total Bilirubin 12/30/2014 0.8  0.3 - 1.2 mg/dL Final  . GFR calc non Af Amer 12/30/2014 14* >60 mL/min Final  . GFR calc Af Amer 12/30/2014 16* >60 mL/min Final   Comment: (NOTE) The eGFR has been calculated using the CKD EPI equation. This calculation has not been validated in all clinical situations. eGFR's persistently <60 mL/min signify possible Chronic Kidney Disease.   . Anion gap 12/30/2014 10  5 - 15 Final  . Hgb A1c MFr Bld 12/30/2014 5.5  4.8 - 5.6 % Final   Comment: (NOTE)         Pre-diabetes: 5.7 - 6.4         Diabetes: >6.4         Glycemic control for adults with diabetes: <7.0   . Mean Plasma Glucose 12/30/2014 111   Final   Comment: (NOTE) Performed At: [facility] [place], [place] [id number] Lindon Romp MD [id number]   . Prothrombin Time 12/30/2014 16.2* 11.6 - 15.2 seconds Final  . INR 12/30/2014 1.28  0.00 - 1.49 Final  . Color, Urine 12/30/2014 YELLOW  YELLOW Final  . APPearance 12/30/2014 TURBID* CLEAR Final  . Specific Gravity, Urine 12/30/2014 1.017  1.005 - 1.030 Final  . pH 12/30/2014 8.0  5.0 - 8.0 Final  . Glucose, UA 12/30/2014 NEGATIVE  NEGATIVE mg/dL Final  . Hgb urine dipstick 12/30/2014 SMALL* NEGATIVE Final  . Bilirubin Urine 12/30/2014 NEGATIVE  NEGATIVE Final  . Ketones, ur 12/30/2014 NEGATIVE  NEGATIVE mg/dL Final  . Protein, ur 12/30/2014 >300* NEGATIVE mg/dL Final  . Urobilinogen, UA 12/30/2014 0.2  0.0 - 1.0 mg/dL Final  . Nitrite 12/30/2014 NEGATIVE  NEGATIVE Final  . Leukocytes, UA 12/30/2014 LARGE* NEGATIVE Final  . MRSA, PCR 12/30/2014 NEGATIVE  NEGATIVE Final  . Staphylococcus aureus 12/30/2014 NEGATIVE  NEGATIVE Final   Comment:        The Xpert SA Assay (  FDA approved for NASAL specimens in patients over 4 years of age), is one component of a comprehensive surveillance program.  Test performance has been validated by [facility] for patients greater than or equal to 52 year old. It is not intended to diagnose infection nor to guide or monitor treatment.   . Squamous Epithelial / LPF 12/30/2014 RARE  RARE Final  . WBC, UA 12/30/2014 11-20  <3 WBC/hpf Final  . RBC / HPF 12/30/2014 0-2  <3 RBC/hpf Final  . Bacteria, UA 12/30/2014 FEW* RARE Final  . Crystals 12/30/2014 TRIPLE PHOSPHATE CRYSTALS* NEGATIVE Final  . Urine-Other 12/30/2014 AMORPHOUS URATES/PHOSPHATES   Final  Admission on 12/10/2014, Discharged on 12/22/2014  No results displayed because visit has over 200 results.    Office Visit on 12/09/2014  Component Date Value Ref Range Status  . Sodium 12/09/2014 137  135 - 145 mEq/L Final  . Potassium 12/09/2014 4.6  3.5 - 5.1 mEq/L Final  . Chloride 12/09/2014 100  96 - 112 mEq/L Final  .  CO2 12/09/2014 26  19 - 32 mEq/L Final  . Glucose, Bld 12/09/2014 116* 70 - 99 mg/dL Final  . BUN 12/09/2014 67* 6 - 23 mg/dL Final  . Creatinine, Ser 12/09/2014 5.10* 0.40 - 1.50 mg/dL Final  . Calcium 12/09/2014 9.6  8.4 - 10.5 mg/dL Final  . GFR 12/09/2014 11.82* >60.00 mL/min Final  . Pro B Natriuretic peptide (BNP) 12/09/2014 3135.0* 0.0 - 100.0 pg/mL Final  There may be more visits with results that are not included.  Dg Chest 2 View  02/28/2015  CLINICAL DATA:  CHF, post heart surgery, SOB EXAM: CHEST - 2 VIEW COMPARISON:  02/24/2015 FINDINGS: Stable mild cardiomegaly. Atheromatous tortuous thoracic aorta. Changes of AVR. Small bilateral pleural effusions stable. Mild interstitial edema or infiltrates predominately in the lung bases, slightly increased. No pneumothorax. Spurring in the lower thoracic spine. IMPRESSION: 1. Slight increase in bilateral interstitial edema or infiltrates. 2. Stable cardiomegaly and tiny effusions. Electronically Signed   By: Lucrezia Europe M.D.   On: 02/28/2015 08:13   Dg Chest 2 View  02/24/2015  CLINICAL DATA:  Congestive failure EXAM: CHEST - 2 VIEW COMPARISON:  02/23/2015 FINDINGS: Cardiac shadow is enlarged but stable. Stent based aortic valve replacement is again seen. Mild vascular congestion and interstitial changes are noted consistent with CHF. The right chest tube is been removed in the interval. A tiny right pneumothorax remains in the apex but stable. No new focal infiltrate is seen. IMPRESSION: Tiny right pneumothorax is noted. Changes of congestive failure Electronically Signed   By: Inez Catalina M.D.   On: 02/24/2015 08:00   Dg Chest 2 View  02/11/2015  CLINICAL DATA:  Transcatheter aortic valve replacement ; history of COPD, aortic stenosis, CHF, chronic renal insufficiency. EXAM: CHEST  2 VIEW COMPARISON:  PA and lateral chest x-ray of December 30, 2014 ; CT scan of the chest dated December 18, 2014 FINDINGS: The lungs are mildly hyperinflated. The  interstitial markings are coarse. There is evidence of previous granulomatous infection. The heart is top-normal in size. There is calcification of the mitral valvular annulus. The pulmonary vascularity is normal. There is mild tortuosity of the descending thoracic aorta. There is calcification in the aortic arch. There is no pleural effusion. The bony thorax exhibits no acute abnormality. IMPRESSION: COPD with chronic pulmonary fibrotic changes and previous granulomatous infection. Top-normal cardiac size without evidence of pulmonary edema. No significant pleural effusion. Electronically Signed   By: Shanon Brow  [place] M.D.   On: 02/11/2015 10:02   Dg Chest Port 1 View  02/23/2015  CLINICAL DATA:  Status post transcatheter aortic valve replacement EXAM: PORTABLE CHEST 1 VIEW COMPARISON:  Portable chest x-ray of February 22, 2015 FINDINGS: There is a persistent tiny right apical pneumothorax. The pleural line overlies the posterior aspect of the third rib. The right-sided chest tube is unchanged in position with its tip overlying the posterior aspect of the ninth rib. There is no large pleural effusion. The cardiac silhouette remains enlarged. The pulmonary vascularity is less engorged. The interstitial markings remain increased but have improved. There is mild subsegmental atelectasis partially obscuring the left heart border. The left internal jugular venous catheter tip projects at the junction of the right and left brachiocephalic veins. IMPRESSION: 1. Persistent tiny right apical pneumothorax. The right-sided chest tube is unchanged in position. 2. Decreased pulmonary interstitial edema. Persistent left basilar subsegmental atelectasis. Electronically Signed   By: David  Martinique M.D.   On: 02/23/2015 07:44   Dg Chest Port 1 View  02/22/2015  CLINICAL DATA:  Check chest tube placement EXAM: PORTABLE CHEST - 1 VIEW COMPARISON:  02/21/2015 FINDINGS: Cardiac shadow remains enlarged. Patient has undergone prior  TAVR procedure. Bilateral chest tubes remain in place. No significant pneumothorax is noted on the left. A tiny right-sided pneumothorax is seen. Right jugular and left jugular central lines are again noted and stable. Bony structures are within normal limits. No new focal abnormality is seen. IMPRESSION: Tiny right apical pneumothorax. Chest tubes in satisfactory position. Tubes and lines as described above. Status post TAVR Electronically Signed   By: Inez Catalina M.D.   On: 02/22/2015 09:59   Dg Chest Port 1 View  02/21/2015  CLINICAL DATA:  Bilateral chest tubes EXAM: PORTABLE CHEST 1 VIEW COMPARISON:  02/20/2015 FINDINGS: Stable right chest tube. Stable left chest tube versus mediastinal drain. No pneumothorax is seen. Cardiomegaly. Pulmonary vascular congestion without frank interstitial edema. Small left pleural effusion. Stable right IJ venous sheath. Stable left IJ dual lumen venous catheter terminating in the distal left brachiocephalic vein. Prosthetic aortic valve. IMPRESSION: Stable chest tube/drains.  No pneumothorax is seen. Cardiomegaly with pulmonary vascular congestion. No frank interstitial edema. Small left pleural effusion. Additional stable support apparatus as above. Electronically Signed   By: Julian Hy M.D.   On: 02/21/2015 09:11   Dg Chest Port 1 View  02/20/2015  CLINICAL DATA:  Congestive heart failure.  Chest tubes. EXAM: PORTABLE CHEST 1 VIEW COMPARISON:  02/19/2015 FINDINGS: Bilateral jugular catheters and chest tubes are unchanged. Cardiac silhouette remains enlarged. Sequelae of prior transcatheter aortic valve replacement are again identified. Thoracic aortic calcification is noted. There are likely small bilateral pleural effusions. The retrocardiac left lower lobe opacity is unchanged. There is minimal right basilar atelectasis. No overt pulmonary edema or pneumothorax is seen. IMPRESSION: 1. Small pleural effusions and left greater than right basilar atelectasis.  2. No pneumothorax. Electronically Signed   By: Logan Bores M.D.   On: 02/20/2015 08:01   Dg Chest Port 1 View  02/19/2015  CLINICAL DATA:  Right pleural effusion.  Chest tube in place EXAM: PORTABLE CHEST 1 VIEW COMPARISON:  02/19/2015 FINDINGS: Cardiomegaly again noted. Stable left IJ catheter position. Stable right IJ sheath position. Right chest tube. The right pleural effusion has resolved. No pulmonary edema. No pneumothorax. Persistent small left pleural effusion left basilar atelectasis. IMPRESSION: Right pleural effusion has resolved. No pulmonary edema. Right chest tube in place. No pneumothorax Electronically Signed  By: Lahoma Crocker M.D.   On: 02/19/2015 11:35   Dg Chest Port 1 View  02/19/2015  CLINICAL DATA:  Congestive heart failure. EXAM: PORTABLE CHEST 1 VIEW COMPARISON:  02/18/2015. FINDINGS: Right IJ line, left IJ line in stable position. Prior cardiac valve replacement. Cardiomegaly with diffuse bilateral pulmonary interstitial infiltrates and bilateral pleural effusions. No interim change. Findings consistent with congestive heart failure . IMPRESSION: 1. Bilateral IJ lines in stable position. 2. Prior cardiac valve replacement. Persistent cardiomegaly pulmonary venous congestion with persistent bilateral prominent pulmonary interstitium and bilateral pleural effusions consistent with persistent congestive heart failure. No interim change from prior exam . Electronically Signed   By: Marcello Moores  Register   On: 02/19/2015 08:01   Dg Chest Port 1 View  02/18/2015  CLINICAL DATA:  Placement of left IJ hemodialysis catheter. EXAM: PORTABLE CHEST 1 VIEW COMPARISON:  02/18/2015 FINDINGS: The left IJ dialysis catheter tip is in the mid SVC likely against the lateral wall of the SVC. The right IJ Cordis is still in place. Persistent cardiac enlargement, pulmonary edema, pleural effusions and bibasilar atelectasis. IMPRESSION: Left IJ catheter tip is in the mid SVC against the lateral wall of the  SVC. Right IJ Cordis in good position. Persistent cardiac enlargement, pulmonary edema, pleural effusions and atelectasis. Electronically Signed   By: Marijo Sanes M.D.   On: 02/18/2015 13:48   Dg Chest Port 1 View  02/18/2015  CLINICAL DATA:  Post aortic valve replacement, followup EXAM: PORTABLE CHEST 1 VIEW COMPARISON:  Portable chest x-ray of 02/17/2015 and 02/16/2015 FINDINGS: The lungs are not as well aerated and there is little change to slight worsening of probable CHF pattern with cardiomegaly, pulmonary vascular congestion, and effusions. The Swan-Ganz catheter has been removed and a venous sheath remains in the SVC. Aortic valve replacement is noted. IMPRESSION: Diminished aeration with probable slight worsening of CHF pattern with effusions. Electronically Signed   By: Ivar Drape M.D.   On: 02/18/2015 08:14   Dg Chest Port 1 View  02/17/2015  CLINICAL DATA:  Transcatheter aortic valve replacement EXAM: PORTABLE CHEST 1 VIEW COMPARISON:  02/16/2015 FINDINGS: Interval extubation. Cardiomegaly with pulmonary vascular congestion and suspected mild interstitial edema. Layering right pleural effusion. Left lung base is obscured. Left chest tube.  No pneumothorax is seen. Prior IJ Swan-Ganz catheter terminates in the main pulmonary artery. Prosthetic aortic valve. IMPRESSION: Interval extubation. Cardiomegaly with mild interstitial edema and layering right pleural effusion. Left chest tube.  No pneumothorax is seen. Electronically Signed   By: Julian Hy M.D.   On: 02/17/2015 08:12   Dg Chest Port 1 View  02/16/2015  CLINICAL DATA:  TAVR postop EXAM: PORTABLE CHEST 1 VIEW COMPARISON:  02/11/2015 FINDINGS: Aortic valve replacement in satisfactory position. Endotracheal tube in good position. Swan-Ganz catheter tip in the right lower pulmonary artery. Cardiac enlargement with mild vascular congestion. Left lower lobe atelectasis. No significant effusion. IMPRESSION: Postop TAVR. Mild vascular  congestion. Left lower lobe atelectasis. Electronically Signed   By: Franchot Gallo M.D.   On: 02/16/2015 11:36     Assessment/Plan   ICD-9-CM ICD-10-CM   1. 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 ESRD (end stage renal disease) on dialysis ([place]) 585.6 N18.6    V45.11 Z99.2   3. HOCM (hypertrophic obstructive cardiomyopathy) (HCC) 425.11 I42.1   4. Chronic diastolic CHF, class 3 174.94 I50.32   5. Chronic anticoagulation due to #6 V58.61 Z79.01   6. Chronic  atrial fibrillation (HCC) - rate controlled 427.31 I48.2     Cont current meds as ordered  PT/OT as ordered  Labs as ordered  Follow INR closely. Goal INR 2-2.5  Cont fluid restriction 1500cc daily  F/u with specialists as scheduled  GOAL: short term rehab and d/c home when medically appropriate. Communicated with pt and nursing.  Will follow  Richardine Peppers S. Perlie Gold  [facility] [address] [place], [place] [address] [phone] Cell (Monday-Friday 8 AM - 5 PM) [phone] After 5 PM and follow prompts

## 2015-03-04 NOTE — Telephone Encounter (Signed)
New Message  Pt in rehab and does not think he will be out by his 11/10 appointment and requested to speak w/ RN on how to proceed- pt wanted to know if Dr Radford Pax wanted him to see her herself or if he can see a PA for procedure f/u. Please call back and discuss.

## 2015-03-04 NOTE — Telephone Encounter (Signed)
That is fine 

## 2015-03-04 NOTE — Telephone Encounter (Signed)
Patient st he will be in rehab for 2 weeks.  He wants to know if Dr. Radford Pax is OK with cancelling 11/10 appointment and seeing an APP instead when he is out of rehab.   To Dr. Radford Pax.

## 2015-03-05 ENCOUNTER — Inpatient Hospital Stay (HOSPITAL_COMMUNITY)
Admission: EM | Admit: 2015-03-05 | Discharge: 2015-03-07 | DRG: 308 | Disposition: A | Discharge status: Skilled Nursing Facility | Payer: MEDICARE | Attending: Internal Medicine | Admitting: Internal Medicine

## 2015-03-05 ENCOUNTER — Encounter (HOSPITAL_COMMUNITY): Payer: Self-pay

## 2015-03-05 ENCOUNTER — Inpatient Hospital Stay (HOSPITAL_COMMUNITY): Payer: MEDICARE

## 2015-03-05 ENCOUNTER — Emergency Department (HOSPITAL_COMMUNITY): Payer: MEDICARE

## 2015-03-05 DIAGNOSIS — I421 Obstructive hypertrophic cardiomyopathy: Secondary | ICD-10-CM | POA: Diagnosis present

## 2015-03-05 DIAGNOSIS — Z992 Dependence on renal dialysis: Secondary | ICD-10-CM

## 2015-03-05 DIAGNOSIS — M5489 Other dorsalgia: Secondary | ICD-10-CM | POA: Diagnosis not present

## 2015-03-05 DIAGNOSIS — M1990 Unspecified osteoarthritis, unspecified site: Secondary | ICD-10-CM | POA: Diagnosis present

## 2015-03-05 DIAGNOSIS — Z7901 Long term (current) use of anticoagulants: Secondary | ICD-10-CM

## 2015-03-05 DIAGNOSIS — Z87891 Personal history of nicotine dependence: Secondary | ICD-10-CM | POA: Diagnosis not present

## 2015-03-05 DIAGNOSIS — I132 Hypertensive heart and chronic kidney disease with heart failure and with stage 5 chronic kidney disease, or end stage renal disease: Secondary | ICD-10-CM | POA: Diagnosis present

## 2015-03-05 DIAGNOSIS — I2510 Atherosclerotic heart disease of native coronary artery without angina pectoris: Secondary | ICD-10-CM | POA: Diagnosis present

## 2015-03-05 DIAGNOSIS — I248 Other forms of acute ischemic heart disease: Secondary | ICD-10-CM | POA: Diagnosis present

## 2015-03-05 DIAGNOSIS — Z7982 Long term (current) use of aspirin: Secondary | ICD-10-CM | POA: Diagnosis not present

## 2015-03-05 DIAGNOSIS — R7989 Other specified abnormal findings of blood chemistry: Secondary | ICD-10-CM | POA: Diagnosis present

## 2015-03-05 DIAGNOSIS — I472 Ventricular tachycardia, unspecified: Secondary | ICD-10-CM

## 2015-03-05 DIAGNOSIS — I272 Other secondary pulmonary hypertension: Secondary | ICD-10-CM | POA: Diagnosis present

## 2015-03-05 DIAGNOSIS — Z9841 Cataract extraction status, right eye: Secondary | ICD-10-CM

## 2015-03-05 DIAGNOSIS — N200 Calculus of kidney: Secondary | ICD-10-CM | POA: Diagnosis present

## 2015-03-05 DIAGNOSIS — I481 Persistent atrial fibrillation: Principal | ICD-10-CM | POA: Diagnosis present

## 2015-03-05 DIAGNOSIS — I5032 Chronic diastolic (congestive) heart failure: Secondary | ICD-10-CM | POA: Diagnosis not present

## 2015-03-05 DIAGNOSIS — Y95 Nosocomial condition: Secondary | ICD-10-CM | POA: Diagnosis present

## 2015-03-05 DIAGNOSIS — I4729 Other ventricular tachycardia: Secondary | ICD-10-CM

## 2015-03-05 DIAGNOSIS — Z79899 Other long term (current) drug therapy: Secondary | ICD-10-CM

## 2015-03-05 DIAGNOSIS — R778 Other specified abnormalities of plasma proteins: Secondary | ICD-10-CM | POA: Diagnosis present

## 2015-03-05 DIAGNOSIS — Z8551 Personal history of malignant neoplasm of bladder: Secondary | ICD-10-CM

## 2015-03-05 DIAGNOSIS — M549 Dorsalgia, unspecified: Secondary | ICD-10-CM | POA: Diagnosis present

## 2015-03-05 DIAGNOSIS — R109 Unspecified abdominal pain: Secondary | ICD-10-CM

## 2015-03-05 DIAGNOSIS — I739 Peripheral vascular disease, unspecified: Secondary | ICD-10-CM | POA: Diagnosis present

## 2015-03-05 DIAGNOSIS — N186 End stage renal disease: Secondary | ICD-10-CM | POA: Diagnosis present

## 2015-03-05 DIAGNOSIS — N2581 Secondary hyperparathyroidism of renal origin: Secondary | ICD-10-CM | POA: Diagnosis present

## 2015-03-05 DIAGNOSIS — Z961 Presence of intraocular lens: Secondary | ICD-10-CM | POA: Diagnosis present

## 2015-03-05 DIAGNOSIS — I4891 Unspecified atrial fibrillation: Secondary | ICD-10-CM | POA: Diagnosis present

## 2015-03-05 DIAGNOSIS — I10 Essential (primary) hypertension: Secondary | ICD-10-CM | POA: Diagnosis present

## 2015-03-05 DIAGNOSIS — Z9842 Cataract extraction status, left eye: Secondary | ICD-10-CM

## 2015-03-05 DIAGNOSIS — D649 Anemia, unspecified: Secondary | ICD-10-CM | POA: Diagnosis present

## 2015-03-05 DIAGNOSIS — Z954 Presence of other heart-valve replacement: Secondary | ICD-10-CM | POA: Diagnosis not present

## 2015-03-05 DIAGNOSIS — Z952 Presence of prosthetic heart valve: Secondary | ICD-10-CM

## 2015-03-05 DIAGNOSIS — J189 Pneumonia, unspecified organism: Secondary | ICD-10-CM | POA: Diagnosis present

## 2015-03-05 HISTORY — DX: Dependence on renal dialysis: Z99.2

## 2015-03-05 HISTORY — DX: End stage renal disease: N18.6

## 2015-03-05 LAB — PROTIME-INR
INR: 3.11 — ABNORMAL HIGH (ref 0.00–1.49)
Prothrombin Time: 31.4 seconds — ABNORMAL HIGH (ref 11.6–15.2)

## 2015-03-05 LAB — CBC
HEMATOCRIT: 34 % — AB (ref 39.0–52.0)
Hemoglobin: 10.8 g/dL — ABNORMAL LOW (ref 13.0–17.0)
MCH: 34.7 pg — ABNORMAL HIGH (ref 26.0–34.0)
MCHC: 31.8 g/dL (ref 30.0–36.0)
MCV: 109.3 fL — AB (ref 78.0–100.0)
PLATELETS: 247 10*3/uL (ref 150–400)
RBC: 3.11 MIL/uL — ABNORMAL LOW (ref 4.22–5.81)
RDW: 18.7 % — AB (ref 11.5–15.5)
WBC: 9.5 10*3/uL (ref 4.0–10.5)

## 2015-03-05 LAB — BASIC METABOLIC PANEL
Anion gap: 15 (ref 5–15)
BUN: 16 mg/dL (ref 6–20)
CHLORIDE: 94 mmol/L — AB (ref 101–111)
CO2: 29 mmol/L (ref 22–32)
CREATININE: 2.54 mg/dL — AB (ref 0.61–1.24)
Calcium: 8.4 mg/dL — ABNORMAL LOW (ref 8.9–10.3)
GFR calc Af Amer: 27 mL/min — ABNORMAL LOW (ref >60)
GFR, EST NON AFRICAN AMERICAN: 23 mL/min — AB (ref >60)
GLUCOSE: 108 mg/dL — AB (ref 65–99)
POTASSIUM: 3.6 mmol/L (ref 3.5–5.1)
SODIUM: 138 mmol/L (ref 135–145)

## 2015-03-05 LAB — TROPONIN I
TROPONIN I: 0.3 ng/mL — AB (ref <0.031)
TROPONIN I: 0.33 ng/mL — AB (ref <0.031)

## 2015-03-05 LAB — MAGNESIUM: Magnesium: 2.1 mg/dL (ref 1.7–2.4)

## 2015-03-05 LAB — LIPASE, BLOOD: Lipase: 98 U/L — ABNORMAL HIGH (ref 11–51)

## 2015-03-05 MED ORDER — ADULT MULTIVITAMIN W/MINERALS CH
1.0000 | ORAL_TABLET | Freq: Every day | ORAL | Status: DC
  Administered 2015-03-06 – 2015-03-07 (×2): 1 via ORAL
  Filled 2015-03-05 (×2): qty 1

## 2015-03-05 MED ORDER — WARFARIN SODIUM 1 MG PO TABS
1.0000 mg | ORAL_TABLET | Freq: Once | ORAL | Status: AC
  Administered 2015-03-05: 1 mg via ORAL
  Filled 2015-03-05: qty 1

## 2015-03-05 MED ORDER — ACETAMINOPHEN 500 MG PO TABS: 1000.0000 mg | ORAL_TABLET | Freq: Three times a day (TID) | ORAL | Status: DC | PRN

## 2015-03-05 MED ORDER — METOPROLOL TARTRATE 12.5 MG HALF TABLET
12.5000 mg | ORAL_TABLET | Freq: Two times a day (BID) | ORAL | Status: DC
  Administered 2015-03-06 – 2015-03-07 (×3): 12.5 mg via ORAL
  Filled 2015-03-05 (×4): qty 1

## 2015-03-05 MED ORDER — MORPHINE SULFATE (PF) 2 MG/ML IV SOLN: 2.0000 mg | INTRAVENOUS | Status: DC | PRN

## 2015-03-05 MED ORDER — MULTI-VITAMIN/MINERALS PO TABS: 1.0000 | ORAL_TABLET | Freq: Every day | ORAL | Status: DC

## 2015-03-05 MED ORDER — DOXYCYCLINE HYCLATE 100 MG PO TABS
100.0000 mg | ORAL_TABLET | Freq: Two times a day (BID) | ORAL | Status: DC
  Administered 2015-03-06 – 2015-03-07 (×4): 100 mg via ORAL
  Filled 2015-03-05 (×4): qty 1

## 2015-03-05 MED ORDER — DM-GUAIFENESIN ER 30-600 MG PO TB12
1.0000 | ORAL_TABLET | Freq: Two times a day (BID) | ORAL | Status: DC
  Administered 2015-03-06 – 2015-03-07 (×4): 1 via ORAL
  Filled 2015-03-05 (×4): qty 1

## 2015-03-05 MED ORDER — PANCRELIPASE (LIP-PROT-AMYL) 12000-38000 UNITS PO CPEP
48000.0000 [IU] | ORAL_CAPSULE | Freq: Two times a day (BID) | ORAL | Status: DC
  Administered 2015-03-06 – 2015-03-07 (×3): 48000 [IU] via ORAL
  Filled 2015-03-05 (×3): qty 4

## 2015-03-05 MED ORDER — ONDANSETRON HCL 4 MG/2ML IJ SOLN: 4.0000 mg | Freq: Three times a day (TID) | INTRAMUSCULAR | Status: DC | PRN

## 2015-03-05 MED ORDER — RENA-VITE PO TABS
1.0000 | ORAL_TABLET | Freq: Every day | ORAL | Status: DC
  Administered 2015-03-05: 1 via ORAL
  Filled 2015-03-05: qty 1

## 2015-03-05 MED ORDER — DILTIAZEM LOAD VIA INFUSION
15.0000 mg | Freq: Once | INTRAVENOUS | Status: AC
  Administered 2015-03-05: 15 mg via INTRAVENOUS
  Filled 2015-03-05: qty 15

## 2015-03-05 MED ORDER — ASPIRIN EC 81 MG PO TBEC
81.0000 mg | DELAYED_RELEASE_TABLET | Freq: Every day | ORAL | Status: DC
  Administered 2015-03-06 – 2015-03-07 (×2): 81 mg via ORAL
  Filled 2015-03-05 (×4): qty 1

## 2015-03-05 MED ORDER — SODIUM CHLORIDE 0.9 % IV SOLN: 125.0000 mg | INTRAVENOUS | Status: DC

## 2015-03-05 MED ORDER — ARTIFICIAL TEARS OP OINT: TOPICAL_OINTMENT | Freq: Every day | OPHTHALMIC | Status: DC | PRN

## 2015-03-05 MED ORDER — SEVELAMER CARBONATE 800 MG PO TABS
1600.0000 mg | ORAL_TABLET | Freq: Three times a day (TID) | ORAL | Status: DC
  Administered 2015-03-06 – 2015-03-07 (×4): 1600 mg via ORAL
  Filled 2015-03-05 (×4): qty 2

## 2015-03-05 MED ORDER — DILTIAZEM HCL 100 MG IV SOLR
5.0000 mg/h | INTRAVENOUS | Status: DC
  Administered 2015-03-05: 5 mg/h via INTRAVENOUS
  Administered 2015-03-06: 10 mg/h via INTRAVENOUS
  Filled 2015-03-05 (×2): qty 100

## 2015-03-05 MED ORDER — ONDANSETRON HCL 4 MG/2ML IJ SOLN
4.0000 mg | Freq: Once | INTRAMUSCULAR | Status: AC
  Administered 2015-03-05: 4 mg via INTRAVENOUS
  Filled 2015-03-05: qty 2

## 2015-03-05 MED ORDER — WARFARIN - PHARMACIST DOSING INPATIENT
Freq: Every day | Status: DC
Start: 2015-03-06 — End: 2015-03-07

## 2015-03-05 MED ORDER — DILTIAZEM HCL ER COATED BEADS 180 MG PO CP24
180.0000 mg | ORAL_CAPSULE | Freq: Every day | ORAL | Status: DC
  Administered 2015-03-05: 180 mg via ORAL
  Filled 2015-03-05: qty 1

## 2015-03-05 MED ORDER — RENAL VITAMIN 0.8 MG PO TABS: 1.0000 | ORAL_TABLET | Freq: Every day | ORAL | Status: DC

## 2015-03-05 NOTE — ED Notes (Signed)
Dr. Goldston at bedside.  

## 2015-03-05 NOTE — Progress Notes (Signed)
ANTICOAGULATION CONSULT NOTE - Initial Consult  Pharmacy Consult for warfarin Indication: atrial fibrillation  Allergies  Allergen Reactions  . Amiodarone Other (See Comments)    Severely reduced DLCO  . Oxycodone Hcl Rash and Other (See Comments)    Rash and itching    Patient Measurements: Height: 5\' 8"  (172.7 cm) Weight: 163 lb (73.936 kg) IBW/kg (Calculated) : 68.4  Vital Signs: Temp: 97.7 F (36.5 C) (11/04 1713) Temp Source: Oral (11/04 1713) BP: 110/73 mmHg (11/04 1945) Pulse Rate: 125 (11/04 1945)  Labs:  Recent Labs  03/05/15 1726 03/05/15 1807  HGB 10.8*  --   HCT 34.0*  --   PLT 247  --   LABPROT 31.4*  --   INR 3.11*  --   CREATININE 2.54*  --   TROPONINI  --  0.30*    Estimated Creatinine Clearance: 24.7 mL/min (by C-G formula based on Cr of 2.54).   Medical History: Past Medical History  Diagnosis Date  . Atrial fibrillation, persistent ([place])     a. cardioversion 11/21/10 . b. 05/2013 - experiencing more SOB. 24-hr holter showed avg HR 49, max 82, min 38bpm. ETT showed baseline junctional bradycardia, HR incr only to 90bpm with drop in BP. Saw EP - amiodarone reduced and eventually d/c'd with improvement in dyspnea.  . Hypertension   . Warfarin anticoagulation   . CKD (chronic kidney disease), stage V ([place])     a. Followed by Dr. Jimmy Footman. b. IV fistula placed 04/17/14. c. initiation of HD August 2016  . Aortic valve stenosis   . Orthostatic hypotension   . Diastolic dysfunction   . Arthritis   . Self-catheterizes urinary bladder   . Junctional bradycardia   . NSVT (nonsustained ventricular tachycardia) ([place])     a. By holter 01/2014.  [name] Shortness of breath dyspnea   . PVD (peripheral vascular disease) ([place])   . Cardiomyopathy ([place])     non-ischemic  . RTA (renal tubular acidosis)   . Hypomagnesemia   . A-V fistula ([place])   . CAD (coronary artery disease) 12/16/14    a. cath - nonobstrucive CAD 30% pro RCA; 25% dis RCA; 40T pro Lcx  .  Heart murmur   . Dysrhythmia   . CHF (congestive heart failure) ([place])   . Full dentures   . Uses hearing aid   . Chronic diarrhea     takes imodium BID  . Pneumonia     hx of  . Hemodialysis patient [facility])     M,W,F - dialysis initiated August 2016  . Bruises easily   . Bladder cancer ([place])     a. 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  . S/P TAVR (transcatheter aortic valve replacement) 02/16/2015    29 mm Edwards Sapien XT transcatheter heart valve placed via transapical approach   Assessment: 59 yom presented to the ED with afib. He is chronically anticoagulated on coumadin. INR is very slightly elevated. H/H is slightly low and platelets are WNL. No bleeding noted.   Goal of Therapy:  INR 2-3 Monitor platelets by anticoagulation protocol: Yes   Plan:  - Give low dose warfarin 1mg  PO x 1 tonight - Daily INR  Tammee Thielke, Rande Lawman 03/05/2015,8:06 PM

## 2015-03-05 NOTE — ED Provider Notes (Signed)
CSN: [id number]     Arrival date & time 03/05/15  1703 History   First MD Initiated Contact with Patient 03/05/15 1713     Chief Complaint  Patient presents with  . Atrial Fibrillation     (Consider location/radiation/quality/duration/timing/severity/associated sxs/prior Treatment) HPI  74 year old male with a history of persistent atrial fibrillation on warfarin anticoagulation presents from dialysis with A. fib with RVR. Patient does not note that he is in A. fib with RVR except that he was told this. He has been having intermittent brief episodes of shortness of breath over last couple days. Does not feel increased palpitations. Denies chest pain. He is chronically on Cardizem and metoprolol. He was discharged from the hospital after an aortic valve repair on 10/31. He is currently in rehabilitation. He states he has been taking all the medicine he has been given and has not missed any doses according to him. While in the ED while I was talking to him he did develop transient right-sided chest pain that lasted less than a few minutes. Seem to be associated with a transient decrease in his heart rate into a more normal range in the 80s or 90s, then his heart rate went back up into the 130s/140s.  Past Medical History  Diagnosis Date  . Atrial fibrillation, persistent ([place])     a. cardioversion 11/21/10 . b. 05/2013 - experiencing more SOB. 24-hr holter showed avg HR 49, max 82, min 38bpm. ETT showed baseline junctional bradycardia, HR incr only to 90bpm with drop in BP. Saw EP - amiodarone reduced and eventually d/c'd with improvement in dyspnea.  . Hypertension   . Warfarin anticoagulation   . CKD (chronic kidney disease), stage V ([place])     a. Followed by Dr. Jimmy Footman. b. IV fistula placed 04/17/14. c. initiation of HD August 2016  . Aortic valve stenosis   . Orthostatic hypotension   . Diastolic dysfunction   . Arthritis   . Self-catheterizes urinary bladder   . Junctional bradycardia    . NSVT (nonsustained ventricular tachycardia) ([place])     a. By holter 01/2014.  [name] Shortness of breath dyspnea   . PVD (peripheral vascular disease) ([place])   . Cardiomyopathy ([place])     non-ischemic  . RTA (renal tubular acidosis)   . Hypomagnesemia   . A-V fistula ([place])   . CAD (coronary artery disease) 12/16/14    a. cath - nonobstrucive CAD 30% pro RCA; 25% dis RCA; 40T pro Lcx  . Heart murmur   . Dysrhythmia   . CHF (congestive heart failure) ([place])   . Full dentures   . Uses hearing aid   . Chronic diarrhea     takes imodium BID  . Pneumonia     hx of  . Hemodialysis patient [facility])     M,W,F - dialysis initiated August 2016  . Bruises easily   . Bladder cancer ([place])     a. 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  . S/P TAVR (transcatheter aortic valve replacement) 02/16/2015    29 mm Edwards Sapien XT transcatheter heart valve placed via transapical approach   Past Surgical History  Procedure Laterality Date  . Cardioversion  11/21/2010  . Cystectomy    . Cholecystectomy    . Cataract extraction w/ intraocular lens  implant, bilateral    . Colonoscopy    . Av fistula placement Left 04/17/2014    Procedure: ARTERIOVENOUS (AV) FISTULA CREATION- LEFT UPPER ARM ;  Surgeon: Nelda Severe  Kellie Simmering, MD;  Location: [facility] OR;  Service: Vascular;  Laterality: Left;  . Bladder surgery      radical cystectomy  . Vascular surgery    . Multiple tooth extractions    . Cardiac catheterization N/A 12/16/2014    Procedure: Right/Left Heart Cath and Coronary Angiography;  Surgeon: [name], MD;  Location: [place] CV LAB;  Service: Cardiovascular;  Laterality: N/A;  . Eye surgery    . Colon surgery  Nov 2002    reconstructed bladder from colon. per pt[facility] at [facility]  . Tee without cardioversion N/A 01/01/2015    Procedure: TRANSESOPHAGEAL ECHOCARDIOGRAM (TEE);  Surgeon: [name], MD;  Location: [facility] ENDOSCOPY;  Service: Cardiovascular;  Laterality: N/A;  . Transcatheter  aortic valve replacement, transapical N/A 02/16/2015    Procedure: TRANSCATHETER AORTIC VALVE REPLACEMENT, TRANSAPICAL;  Surgeon: [name], MD;  Location: [place];  Service: Open Heart Surgery;  Laterality: N/A;  . Tee without cardioversion N/A 02/16/2015    Procedure: TRANSESOPHAGEAL ECHOCARDIOGRAM (TEE);  Surgeon: [name], MD;  Location: [place];  Service: Open Heart Surgery;  Laterality: N/A;   Family History  Problem Relation Age of Onset  . CVA Mother   . Heart attack Father   . Heart disease Father   . Arrhythmia Father   . Alzheimer's disease Sister   . Multiple sclerosis Brother   . Alcohol abuse Father    Social History  Substance Use Topics  . Smoking status: Former Smoker -- 2.00 packs/day for 33 years    Types: Cigarettes    Quit date: 05/02/1983  . Smokeless tobacco: Never Used  . Alcohol Use: Yes     Comment: rare    Review of Systems  Respiratory: Positive for shortness of breath.   Cardiovascular: Positive for leg swelling. Negative for chest pain and palpitations.  All other systems reviewed and are negative.     Allergies  Amiodarone and Oxycodone hcl  Home Medications   Prior to Admission medications   Medication Sig Start Date End Date Taking? Authorizing Provider  acetaminophen (TYLENOL) 500 MG tablet Take 1,000 mg by mouth every 6 (six) hours as needed for mild pain or moderate pain.    Historical Provider, MD  aspirin EC 81 MG EC tablet Take 1 tablet (81 mg total) by mouth daily. 03/01/15   Donielle Liston Alba, PA-C  diltiazem (CARDIZEM CD) 180 MG 24 hr capsule Take 180 mg by mouth at bedtime.    Historical Provider, MD  ferric gluconate 125 mg in sodium chloride 0.9 % 100 mL Inject 125 mg into the vein every Monday, Wednesday, and Friday with hemodialysis. 03/01/15   Donielle Liston Alba, PA-C  Hypromellose (ARTIFICIAL TEARS OP) Place 1 drop into both eyes daily as needed (rinse eyes).     Historical Provider, MD  Loperamide HCl  (IMODIUM PO) Take 1 tablet by mouth 2 (two) times daily.    Historical Provider, MD  metoprolol tartrate (LOPRESSOR) 25 MG tablet Take 0.5 tablets (12.5 mg total) by mouth 2 (two) times daily. 03/01/15   Donielle Liston Alba, PA-C  Multiple Vitamins-Minerals (CENTRUM SILVER ULTRA MENS PO) Take 1 tablet by mouth daily. Take daily    Historical Provider, MD  Pancrelipase, Lip-Prot-Amyl, (CREON) 24000 UNITS CPEP Take 48,000 Units by mouth 2 (two) times daily with a meal.     Historical Provider, MD  sevelamer carbonate (RENVELA) 800 MG tablet 1,600 mg 3 (three) times daily with meals.     Historical Provider, MD  warfarin (COUMADIN) 2 MG tablet Take 1 tablet (2 mg total) by mouth daily. Or take as directed by coumadin clinic 03/01/15   Nani Skillern, PA-C   BP 103/86 mmHg  Pulse 135  Temp(Src) 97.7 F (36.5 C) (Oral)  Resp 18  Ht 5\' 8"  (1.727 m)  Wt 163 lb (73.936 kg)  BMI 24.79 kg/m2  SpO2 92% Physical Exam  Constitutional: He is oriented to person, place, and time. He appears well-developed and well-nourished.  HENT:  Head: Normocephalic and atraumatic.  Right Ear: External ear normal.  Left Ear: External ear normal.  Nose: Nose normal.  Eyes: Right eye exhibits no discharge. Left eye exhibits no discharge.  Neck: Neck supple.  Cardiovascular: Normal heart sounds and intact distal pulses.  An irregularly irregular rhythm present. Tachycardia present.   Pulmonary/Chest: Effort normal and breath sounds normal.  Abdominal: Soft. There is no tenderness.  Musculoskeletal: He exhibits edema (bilateral mild lower extremity edema).  Neurological: He is alert and oriented to person, place, and time.  Skin: Skin is warm and dry. He is not diaphoretic.  Nursing note and vitals reviewed.   ED Course  Procedures (including critical care time) Labs Review Labs Reviewed  BASIC METABOLIC PANEL - Abnormal; Notable for the following:    Chloride 94 (*)    Glucose, Bld 108 (*)     Creatinine, Ser 2.54 (*)    Calcium 8.4 (*)    GFR calc non Af Amer 23 (*)    GFR calc Af Amer 27 (*)    All other components within normal limits  CBC - Abnormal; Notable for the following:    RBC 3.11 (*)    Hemoglobin 10.8 (*)    HCT 34.0 (*)    MCV 109.3 (*)    MCH 34.7 (*)    RDW 18.7 (*)    All other components within normal limits  PROTIME-INR - Abnormal; Notable for the following:    Prothrombin Time 31.4 (*)    INR 3.11 (*)    All other components within normal limits  TROPONIN I - Abnormal; Notable for the following:    Troponin I 0.30 (*)    All other components within normal limits  LIPASE, BLOOD - Abnormal; Notable for the following:    Lipase 98 (*)    All other components within normal limits  TROPONIN I - Abnormal; Notable for the following:    Troponin I 0.33 (*)    All other components within normal limits  MRSA PCR SCREENING  MAGNESIUM  URINALYSIS, ROUTINE W REFLEX MICROSCOPIC (NOT AT [facility])  URINE RAPID DRUG SCREEN, HOSP PERFORMED  TSH  TROPONIN I  TROPONIN I  CBC  COMPREHENSIVE METABOLIC PANEL  PROTIME-INR    Imaging Review Dg Chest Port 1 View  03/05/2015  CLINICAL DATA:  Dyspnea EXAM: PORTABLE CHEST 1 VIEW COMPARISON:  02/28/2015 chest radiograph FINDINGS: Stable aortic valve prosthesis. Stable cardiomediastinal silhouette with mild cardiomegaly. No pneumothorax. Small bilateral pleural effusions, increased bilaterally. No pulmonary edema. Mild hazy left lung base opacity. IMPRESSION: 1. Stable mild cardiomegaly without pulmonary edema. 2. Small bilateral pleural effusions, increased bilaterally. 3. Mild hazy left lung base opacity, favor atelectasis. Electronically Signed   By: Ilona Sorrel M.D.   On: 03/05/2015 18:18   Ct Renal Stone Study  03/05/2015  CLINICAL DATA:  74 year old male with right-sided flank pain for the past hour. EXAM: CT ABDOMEN AND PELVIS WITHOUT CONTRAST TECHNIQUE: Multidetector CT imaging of the abdomen and pelvis was performed  following the standard protocol without IV contrast. COMPARISON:  CT the abdomen and pelvis 05/17/2012. FINDINGS: Lower chest: Small to moderate bilateral pleural effusions lying dependently. This is associated with some passive subsegmental atelectasis in the lower lobes of the lungs bilaterally, and what appears to be some dependent airspace consolidation in the left lower lobe. Cardiomegaly. Extensive mitral annular calcifications. Atherosclerotic calcifications in the right coronary artery. Possible apical left ventricular patch noted. Hepatobiliary: Status post cholecystectomy. No discrete cystic or solid hepatic lesions are confidently identified on today's noncontrast CT examination. The liver has a slightly nodular contour, suggestive of cirrhosis. Pancreas: No pancreatic mass or peripancreatic inflammatory changes on today's noncontrast CT examination. Spleen: Unremarkable.  Small splenule inferior to the spleen. Adrenals/Urinary Tract: Adreniform thickening of the adrenal glands bilaterally. Atrophy of both kidneys. No urinary tract calculi. Calcifications in the renal pelvises bilaterally appear to be vascular. No hydroureteronephrosis. Status post radical cystectomy. Extensive calcifications and numerous surgical clips in the low anatomic pelvis, similar to prior studies. There appears to be a right lower quadrant ileal conduit. Stomach/Bowel: Unenhanced appearance of the stomach is normal. No pathologic dilatation of small bowel or colon. Status post partial right colectomy. Vascular/Lymphatic: Extensive atherosclerosis throughout the abdominal and pelvic vasculature, without evidence of aneurysm. No lymphadenopathy noted in the abdomen or pelvis on today's noncontrast CT examination. Reproductive: It is unclear whether or not the prostate gland is diminutive or surgically absent. Other: Small amount of presacral edema and trace volume of ascites. No larger volume of ascites. No pneumoperitoneum.  Musculoskeletal: There are no aggressive appearing lytic or blastic lesions noted in the visualized portions of the skeleton. 7 mm of anterolisthesis of L4 upon L5. IMPRESSION: 1. No acute findings in the abdomen or pelvis to account for the patient's symptoms. 2. There are small to moderate bilateral pleural effusions lying dependently, and there does appear to be some airspace consolidation in the left lower lobe, concerning for pneumonia or sequela of recent aspiration. 3. Cardiomegaly. 4. Morphologic changes in the liver suggestive of cirrhosis. 5. Status post radical cystectomy with what appears to be a right lower quadrant ileal conduit. 6. Additional incidental findings, as above. Electronically Signed   By: Vinnie Langton M.D.   On: 03/05/2015 20:58   I have personally reviewed and evaluated these images and lab results as part of my medical decision-making.   EKG Interpretation   Date/Time:  Friday March 05 2015 17:16:36 EDT Ventricular Rate:  136 PR Interval:    QRS Duration: 107 QT Interval:  332 QTC Calculation: 499 R Axis:   -93 Text Interpretation:  Atrial fibrillation with RVR no significant change  since Feb 17 2015 Confirmed by Regenia Skeeter  MD, Marquin Patino [phone]) on 03/05/2015  5:32:00 PM      MDM   Final diagnoses:  None  Atrial Fibrillation with RVR Nonsustained Ventricular Tachycardia  Patient's A. fib with RVR has been rate controlled with diltiazem bolus and drip. While on this bolus and drip did have a nonsustained run of ventricular tachycardia of 12 beats. Discussed with cardiology, given his multiple comorbidities they recommend telemetry monitoring with the hospitalist. He will need step down admission based on continuous diltiazem drip and his recent ventricular tachycardia. Magnesium normal. No significant electrolyte dysfunction. He did complete his dialysis today.   Sherwood Gambler, MD 03/06/15 [id number]

## 2015-03-05 NOTE — ED Notes (Signed)
Pt placed on monitor upon return to room from restroom. Pt remains monitored by blood pressure pulse ox, and 12 lead. Pts family remains at bedside.

## 2015-03-05 NOTE — ED Notes (Signed)
Pt reports he feels nauseous. Dr. Regenia Skeeter informed.

## 2015-03-05 NOTE — ED Notes (Signed)
PER EMS: pt arrived from dialysis, sent here due to Afib, HR around 140 at the dialysis center. Hx of afib but pt just began dialysis in September and staff was not aware that afib is not unusual for him. Pt received all of his dialysis today. He is A&Ox4, ambulatory and denies chest pain or any pain. Pt did have aortic valve repair on October 18th. BP: 107/61, HR-140s to 160s, O2-100 RA.

## 2015-03-05 NOTE — ED Notes (Signed)
Attempted report X1

## 2015-03-05 NOTE — ED Notes (Signed)
Pt placed on monitor and into gown upon arrival to room. Pt remains monitored by blood pressure, pulse ox, and 12 lead. pts EKG given to and signed by Dr. Regenia Skeeter.

## 2015-03-05 NOTE — H&P (Addendum)
[facility] History and Physical  Doc Mandala YJE:[id number] DOB: [birth year] DOA: 03/05/2015  Referring physician: ED physician PCP: [name], MD  Specialists:   Chief Complaint: rapid heart rate and R sided back pain  HPI: [name] is a 74 y.o. male with PMH of hypertension, atrial fibrillation on Coumadin, ESRD-HD (M WF), recent TAVR on 02/16/15, PVD, CAD, chronic diastolic congestive heart failure, bladder cancer (S A/P bladder resection and surgically created pouch from colon), self catheter, paroxysmal V. tach, who presents with rapid heart rate and right-sided back pain.  Pt reports that he was found to have A fib with rapid heart rate during hemodialysis today. He could finish full course of dialysis today. He did not have chest pain. No fever or chills. He has mild intermittent shortness of breath which is chronic issue. He also has mild dry cough. At arrival to emergency room, he was noted to have transient and few beats of V. Tach. He reports that he started having severe, intermittent pain over right-sided back and flank area in ED. The pain is sharp, nonradiating. It is associated with nausea, but not vomiting or diarrhea. He reports that he still make  little urine, but no hematuria. He is doing self cath. He does not have abdominal pain, cough, unilateral weakness.  In ED, patient was found to have INR 3.11, positive troponin 0.3, WBC 9.5, temperature normal, tachycardia with heart rate up to 160, tachypnea, potassium 3.6, magnesium 2.1, creatinine 2.5 full, BUN 16. X-ray showed possible left base atelectasis, and a bilateral small pleural effusion.  Where does patient live?  Rehab center Can patient participate in ADLs? None  Review of Systems:   General: no fevers, chills, no changes in body weight, has poor appetite, has fatigue HEENT: no blurry vision, hearing changes or sore throat Pulm: has dyspnea, no coughing, wheezing CV: no chest pain,  palpitations Abd: has nausea, no vomiting, abdominal pain, diarrhea, constipation GU: no dysuria, burning on urination, increased urinary frequency, hematuria  Ext: has leg edema Neuro: no unilateral weakness, numbness, or tingling, no vision change or hearing loss Skin: no rash MSK: No muscle spasm, no deformity, no limitation of range of movement in spin. Has R sided back and flank pain. Heme: No easy bruising.  Travel history: No recent long distant travel.  Allergy:  Allergies  Allergen Reactions  . Amiodarone Other (See Comments)    Severely reduced DLCO  . Oxycodone Hcl Rash and Other (See Comments)    Rash and itching    Past Medical History  Diagnosis Date  . Atrial fibrillation, persistent ([place])     a. cardioversion 11/21/10 . b. 05/2013 - experiencing more SOB. 24-hr holter showed avg HR 49, max 82, min 38bpm. ETT showed baseline junctional bradycardia, HR incr only to 90bpm with drop in BP. Saw EP - amiodarone reduced and eventually d/c'd with improvement in dyspnea.  . Hypertension   . Warfarin anticoagulation   . CKD (chronic kidney disease), stage V ([place])     a. Followed by Dr. Jimmy Footman. b. IV fistula placed 04/17/14. c. initiation of HD August 2016  . Aortic valve stenosis   . Orthostatic hypotension   . Diastolic dysfunction   . Arthritis   . Self-catheterizes urinary bladder   . Junctional bradycardia   . NSVT (nonsustained ventricular tachycardia) ([place])     a. By holter 01/2014.  [name] Shortness of breath dyspnea   . PVD (peripheral vascular disease) ([place])   .  Cardiomyopathy ([place])     non-ischemic  . RTA (renal tubular acidosis)   . Hypomagnesemia   . A-V fistula ([place])   . CAD (coronary artery disease) 12/16/14    a. cath - nonobstrucive CAD 30% pro RCA; 25% dis RCA; 40T pro Lcx  . Heart murmur   . Dysrhythmia   . CHF (congestive heart failure) ([place])   . Full dentures   . Uses hearing aid   . Chronic diarrhea     takes imodium BID  . Pneumonia     hx of   . Hemodialysis patient [facility])     M,W,F - dialysis initiated August 2016  . Bruises easily   . Bladder cancer ([place])     a. 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  . 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 Cardioversion  11/21/2010  . Cystectomy    . Cholecystectomy    . Cataract extraction w/ intraocular lens  implant, bilateral    . Colonoscopy    . Av fistula placement Left 04/17/2014    Procedure: ARTERIOVENOUS (AV) FISTULA CREATION- LEFT UPPER ARM ;  Surgeon: Mal [name], MD;  Location: [place];  Service: Vascular;  Laterality: Left;  . Bladder surgery      radical cystectomy  . Vascular surgery    . Multiple tooth extractions    . Cardiac catheterization N/A 12/16/2014    Procedure: Right/Left Heart Cath and Coronary Angiography;  Surgeon: [name], MD;  Location: [place] [facility];  Service: Cardiovascular;  Laterality: N/A;  . Eye surgery    . Colon surgery  Nov 2002    reconstructed bladder from colon. per pt. at [facility]  . Tee without cardioversion N/A 01/01/2015    Procedure: TRANSESOPHAGEAL ECHOCARDIOGRAM (TEE);  Surgeon: [name], MD;  Location: [facility];  Service: Cardiovascular;  Laterality: N/A;  . Transcatheter aortic valve replacement, transapical N/A 02/16/2015    Procedure: TRANSCATHETER AORTIC VALVE REPLACEMENT, TRANSAPICAL;  Surgeon: [name], MD;  Location: [place];  Service: Open Heart Surgery;  Laterality: N/A;  . Tee without cardioversion N/A 02/16/2015    Procedure: TRANSESOPHAGEAL ECHOCARDIOGRAM (TEE);  Surgeon: [name], MD;  Location: [place];  Service: Open Heart Surgery;  Laterality: N/A;    Social History:  reports that he quit smoking about 31 years ago. His smoking use included Cigarettes. He has a 66 pack-year smoking history. He has never used  smokeless tobacco. He reports that he drinks alcohol. He reports that he does not use illicit drugs.  Family History:  Family History  Problem Relation Age of Onset  . CVA Mother   . Heart attack Father   . Heart disease Father   . Arrhythmia Father   . Alzheimer's disease Sister   . Multiple sclerosis Brother   . Alcohol abuse Father      Prior to Admission medications   Medication Sig Start Date End Date Taking? Authorizing Provider  acetaminophen (TYLENOL) 500 MG tablet Take 1,000 mg by mouth every 6 (six) hours as needed for mild pain or moderate pain.   Yes Historical Provider, MD  aspirin 81 MG tablet Take 81 mg by mouth daily.   Yes Historical Provider, MD  B Complex-C-Folic Acid (RENAL VITAMIN PO) Take 1 tablet by mouth daily.   Yes Historical Provider,  MD  diltiazem (CARDIZEM CD) 180 MG 24 hr capsule Take 180 mg by mouth at bedtime.   Yes Historical Provider, MD  ferric gluconate 125 mg in sodium chloride 0.9 % 100 mL Inject 125 mg into the vein every Monday, Wednesday, and Friday with hemodialysis. 03/01/15  Yes Donielle Liston Alba, PA-C  Hypromellose (ARTIFICIAL TEARS OP) Place 1 drop into both eyes daily as needed (rinse eyes).    Yes Historical Provider, MD  Loperamide HCl (IMODIUM PO) Take 1 tablet by mouth 2 (two) times daily.   Yes Historical Provider, MD  metoprolol tartrate (LOPRESSOR) 25 MG tablet Take 0.5 tablets (12.5 mg total) by mouth 2 (two) times daily. 03/01/15  Yes Donielle Liston Alba, PA-C  Multiple Vitamins-Minerals (MULTIVITAMIN WITH MINERALS) tablet Take 1 tablet by mouth daily.   Yes Historical Provider, MD  Pancrelipase, Lip-Prot-Amyl, (CREON) 24000 UNITS CPEP Take 48,000 Units by mouth 2 (two) times daily with a meal.    Yes Historical Provider, MD  sevelamer carbonate (RENVELA) 800 MG tablet 1,600 mg 3 (three) times daily with meals.    Yes Historical Provider, MD  warfarin (COUMADIN) 3 MG tablet Take 1.5 mg by mouth daily. Supposed to start 03-04-15    Yes Historical Provider, MD    Physical Exam: Filed Vitals:   03/05/15 1815 03/05/15 1830 03/05/15 1845 03/05/15 1927  BP: 112/62 112/78 124/48 151/96  Pulse:    82  Temp:      TempSrc:      Resp: 19 15 20 15   Height:      Weight:      SpO2: 91% 91%  98%   General: Not in acute distress HEENT:       Eyes: PERRL, EOMI, no scleral icterus.       ENT: No discharge from the ears and nose, no pharynx injection, no tonsillar enlargement.        Neck: No JVD, no bruit, no mass felt. Heme: No neck lymph node enlargement. Cardiac: S1/S2, irregularly irregular rhythm, tachycardia, No murmurs, No gallops or rubs. Pulm:  No rales, wheezing, rhonchi or rubs. Abd: Soft, nondistended, nontender, no rebound pain, no organomegaly, BS present. Has tenderness over R CVA Ext: 1+ pitting leg edema bilaterally. 2+DP/PT pulse bilaterally. Musculoskeletal: No joint deformities, No joint redness or warmth, no limitation of ROM in spin. Skin: No rashes.  Neuro: Alert, oriented X3, cranial nerves II-XII grossly intact, muscle strength 5/5 in all extremities, sensation to light touch intact. Psych: Patient is not psychotic, no suicidal or hemocidal ideation.  Labs on Admission:  Basic Metabolic Panel:  Recent Labs Lab 02/27/15 0249 03/01/15 0723 03/05/15 1726 03/05/15 1849  NA 137 135 138  --   K 4.1 4.1 3.6  --   CL 96* 97* 94*  --   CO2 27 25 29   --   GLUCOSE 95 103* 108*  --   BUN 29* 65* 16  --   CREATININE 3.57* 5.41* 2.54*  --   CALCIUM 9.0 9.0 8.4*  --   MG  --   --   --  2.1  PHOS 4.2 6.6*  --   --    Liver Function Tests:  Recent Labs Lab 02/27/15 0249 03/01/15 0723  ALBUMIN 3.0* 2.8*   No results for input(s): LIPASE, AMYLASE in the last 168 hours. No results for input(s): AMMONIA in the last 168 hours. CBC:  Recent Labs Lab 02/27/15 0249 02/28/15 0330 03/01/15 0723 03/05/15 1726  WBC 14.0* 13.4* 13.4* 9.5  HGB 10.5* 10.4* 9.9* 10.8*  HCT 32.4* 33.0* 30.8* 34.0*   MCV 106.9* 109.3* 106.9* 109.3*  PLT 155 227 254 247   Cardiac Enzymes:  Recent Labs Lab 03/05/15 1807  TROPONINI 0.30*    BNP (last 3 results)  Recent Labs  05/20/14 1632 06/09/14 1641 12/10/14 2341  BNP 3101.1* 2524.7* 3973.1*    ProBNP (last 3 results)  Recent Labs  12/09/14 1522  PROBNP 3135.0*    CBG: No results for input(s): GLUCAP in the last 168 hours.  Radiological Exams on Admission: Dg Chest Port 1 View  03/05/2015  CLINICAL DATA:  Dyspnea EXAM: PORTABLE CHEST 1 VIEW COMPARISON:  02/28/2015 chest radiograph FINDINGS: Stable aortic valve prosthesis. Stable cardiomediastinal silhouette with mild cardiomegaly. No pneumothorax. Small bilateral pleural effusions, increased bilaterally. No pulmonary edema. Mild hazy left lung base opacity. IMPRESSION: 1. Stable mild cardiomegaly without pulmonary edema. 2. Small bilateral pleural effusions, increased bilaterally. 3. Mild hazy left lung base opacity, favor atelectasis. Electronically Signed   By: Ilona Sorrel M.D.   On: 03/05/2015 18:18    EKG: Independently reviewed.  Abnormal findings: QTC 499, atrial fibrillation with RVR, LAD, poor R-wave progression Assessment/Plan Principal Problem:   Atrial fibrillation with RVR (HCC) Active Problems:   Essential hypertension   HOCM (hypertrophic obstructive cardiomyopathy) (HCC)   Chronic diastolic CHF, class 3   Ventricular tachycardia (paroxysmal) (HCC)   S/P TAVR (transcatheter aortic valve replacement)   CAD (coronary artery disease)   ESRD (end stage renal disease) on dialysis (HCC)  Addendum: CT-renal stone showed no acute findings in the abdomen or pelvis. There are small to moderate bilateral pleural effusions lying dependently, and there does appear to be some airspace consolidation in the left lower lobe, concerning for pneumonia vs. sequela of recent aspiration. Pt dose not have fever or leukocytosis. I am not fully convinced for PAN, but patient has mild SOB  and dry cough. He also had oxygen desaturation to 82 on room air. -Will start doxycycline for 5 days. -Will started mucinex for cough  Atrial fibrillation with RVR [facility]): Patient has history of atrial fibrillation on Coumadin. CHA2DS2-VASc Score is 5, needs oral anticoagulation. INR is 3.11 on admission. He developed RVR. riggering factor is not clear. Differential diagnosis includes ACS given elevated troponin (which can also be due to demanding ischemia 2/2 RVR), thyroid dysfunction and drug abuse. Patient is hemodynamically stable on admission. EDP discussed with on-call cardiologist, who recommended to control heart rate now.  - will admit to tele bed - started Cardizem gtt in ED - cycle CE q6 x3 and repeat her EKG in the am  - on ASA - 2d echo - check TSH and UDS  CAD and elevated trop: trop 0.3, which is likely due to demanding ischemia 2/2 A fib RVR, but cannot r/o ACS. -continue metoprolol, aspirin  -Follow-up 2-D echo and a troponin 3 as above  New onset R sided back and flank pain: Etiology is not clear. Patient has a CVA tenderness. Will need to rule out kidney stone and pancreatitis. -will check CT-renal stone protocol -check lipase -When necessary morphine for pain, and Zofran for nausea -CMP for LF   Essential hypertension: -On Cardizem and metoprolol  HOCM (hypertrophic obstructive cardiomyopathy) (HCC) - on Metoprolol  Chronic diastolic CHF, class 3: 2-D echo on 02/18/15 showed a year for 55-60 percent. Managed by renal via dialysis. He has 1+ leg edema, but does not seem to have acute CHF exacerbation. -Volume management per renal by HD.  Hx of Ventricular tachycardia (paroxysmal) [facility]): -tele monitoring  ESRD (end stage renal disease) on dialysis [facility]): on MWF. Completed full course of HD today. Potassium normal, creatinine 2.54, BUN 16. -left message to renal for HD -Continue Renvela and Renal vitamin   DVT ppx: On coumadin  Code Status: Full code Family  Communication:  Yes, patient's wife at bed side Disposition Plan: Admit to inpatient   Date of Service 03/05/2015    Ivor Costa [facility] Pager [phone]  If 7PM-7AM, please contact night-coverage [email or web address] Password [facility] 03/05/2015, 7:36 PM

## 2015-03-06 ENCOUNTER — Inpatient Hospital Stay (HOSPITAL_COMMUNITY): Payer: MEDICARE

## 2015-03-06 DIAGNOSIS — I4891 Unspecified atrial fibrillation: Secondary | ICD-10-CM

## 2015-03-06 DIAGNOSIS — I5032 Chronic diastolic (congestive) heart failure: Secondary | ICD-10-CM

## 2015-03-06 DIAGNOSIS — Z992 Dependence on renal dialysis: Secondary | ICD-10-CM

## 2015-03-06 DIAGNOSIS — N186 End stage renal disease: Secondary | ICD-10-CM

## 2015-03-06 DIAGNOSIS — I10 Essential (primary) hypertension: Secondary | ICD-10-CM

## 2015-03-06 DIAGNOSIS — I421 Obstructive hypertrophic cardiomyopathy: Secondary | ICD-10-CM

## 2015-03-06 LAB — TROPONIN I
TROPONIN I: 0.39 ng/mL — AB (ref <0.031)
TROPONIN I: 0.39 ng/mL — AB (ref <0.031)

## 2015-03-06 LAB — COMPREHENSIVE METABOLIC PANEL
ALBUMIN: 3.2 g/dL — AB (ref 3.5–5.0)
ALT: 69 U/L — ABNORMAL HIGH (ref 17–63)
ANION GAP: 11 (ref 5–15)
AST: 97 U/L — AB (ref 15–41)
Alkaline Phosphatase: 73 U/L (ref 38–126)
BUN: 23 mg/dL — AB (ref 6–20)
CHLORIDE: 97 mmol/L — AB (ref 101–111)
CO2: 28 mmol/L (ref 22–32)
Calcium: 8.7 mg/dL — ABNORMAL LOW (ref 8.9–10.3)
Creatinine, Ser: 3.36 mg/dL — ABNORMAL HIGH (ref 0.61–1.24)
GFR calc Af Amer: 19 mL/min — ABNORMAL LOW (ref >60)
GFR calc non Af Amer: 17 mL/min — ABNORMAL LOW (ref >60)
GLUCOSE: 114 mg/dL — AB (ref 65–99)
POTASSIUM: 4.4 mmol/L (ref 3.5–5.1)
Sodium: 136 mmol/L (ref 135–145)
Total Bilirubin: 1.4 mg/dL — ABNORMAL HIGH (ref 0.3–1.2)
Total Protein: 6 g/dL — ABNORMAL LOW (ref 6.5–8.1)

## 2015-03-06 LAB — CBC
HCT: 35.8 % — ABNORMAL LOW (ref 39.0–52.0)
Hemoglobin: 11.3 g/dL — ABNORMAL LOW (ref 13.0–17.0)
MCH: 34.9 pg — ABNORMAL HIGH (ref 26.0–34.0)
MCHC: 31.6 g/dL (ref 30.0–36.0)
MCV: 110.5 fL — ABNORMAL HIGH (ref 78.0–100.0)
PLATELETS: 232 10*3/uL (ref 150–400)
RBC: 3.24 MIL/uL — ABNORMAL LOW (ref 4.22–5.81)
RDW: 19.1 % — AB (ref 11.5–15.5)
WBC: 9.1 10*3/uL (ref 4.0–10.5)

## 2015-03-06 LAB — GLUCOSE, CAPILLARY: Glucose-Capillary: 85 mg/dL (ref 65–99)

## 2015-03-06 LAB — URINE MICROSCOPIC-ADD ON

## 2015-03-06 LAB — URINALYSIS, ROUTINE W REFLEX MICROSCOPIC
Glucose, UA: NEGATIVE mg/dL
Ketones, ur: 15 mg/dL — AB
NITRITE: NEGATIVE
PH: 7 (ref 5.0–8.0)
Protein, ur: >300 mg/dL — AB
SPECIFIC GRAVITY, URINE: 1.023 (ref 1.005–1.030)
UROBILINOGEN UA: 1 mg/dL (ref 0.0–1.0)

## 2015-03-06 LAB — PROTIME-INR
INR: 2.87 — ABNORMAL HIGH (ref 0.00–1.49)
PROTHROMBIN TIME: 29.6 s — AB (ref 11.6–15.2)

## 2015-03-06 LAB — TSH: TSH: 2.583 u[IU]/mL (ref 0.350–4.500)

## 2015-03-06 LAB — MRSA PCR SCREENING: MRSA BY PCR: NEGATIVE

## 2015-03-06 MED ORDER — LOPERAMIDE HCL 2 MG PO CAPS
2.0000 mg | ORAL_CAPSULE | ORAL | Status: DC | PRN
Start: 2015-03-06 — End: 2015-03-07
  Administered 2015-03-06 – 2015-03-07 (×2): 2 mg via ORAL
  Filled 2015-03-06 (×2): qty 1

## 2015-03-06 MED ORDER — WARFARIN SODIUM 2 MG PO TABS
2.0000 mg | ORAL_TABLET | Freq: Once | ORAL | Status: AC
  Administered 2015-03-06: 2 mg via ORAL
  Filled 2015-03-06: qty 1

## 2015-03-06 MED ORDER — SODIUM CHLORIDE 0.9 % IV SOLN: 125.0000 mg | Freq: Once | INTRAVENOUS | Status: DC

## 2015-03-06 MED ORDER — DILTIAZEM HCL 60 MG PO TABS
90.0000 mg | ORAL_TABLET | Freq: Four times a day (QID) | ORAL | Status: DC
  Administered 2015-03-06 – 2015-03-07 (×4): 90 mg via ORAL
  Filled 2015-03-06 (×8): qty 1

## 2015-03-06 NOTE — Progress Notes (Signed)
PT Cancellation Note  Patient Details Name: [name] MRN: [id number] DOB: 03-17-1941   Cancelled Treatment:    Reason Eval/Treat Not Completed: Medical issues which prohibited therapy (awaiting cardiology with fluctuating HR and O2 sats).  Pt is ambulatory but not medically controlled, will await cardiology and see tomorrow.   Ramond Dial 03/06/2015, 10:31 AM  Mee Hives, PT MS Acute Rehab Dept. Number: [facility] [id number] and [place] [phone]

## 2015-03-06 NOTE — Progress Notes (Signed)
Subjective:  Recent dc sp TAVR (for severe AS) sent to ER yest after completed HD with 153 irrrg heart rate  bp okay 132/84/ admitted with A. Fib with RVR now controlled with Cardizem gtt  And HCAP/ had R flank pain in ER Ct showed R renal stone  Denies fever or bloody urine.   Now in [place] no cos.    Noted at op kid center leaving below edw and edw was lowered sec pt co some SOB at rest and co  Bipedal edema to Rita brown NP rounding [facility]est. [facility]  Objective Vital signs in last 24 hours: Filed Vitals:   03/06/15 0514 03/06/15 0555 03/06/15 0800 03/06/15 0900  BP:      Pulse: 81 75 81   Temp:    97.6 F (36.4 C)  TempSrc:    Oral  Resp: 25 15 16    Height:      Weight:      SpO2: 82% 82% 98%    Weight change:   Physical Exam: General: Alert, pleasant , NAD  OX# wife in room  Heart: Irreg irreg rate 80 , no rub or mur  Lungs: CTA bilat nonlabored breathing Abdomen:  Soft , BS normal, NT, ND , No flank discomfort with Palp. R lower qud stoma dressing dry /clean Extremities:  Bipedal 2= edema  Dialysis Access: pos bruit L UA AVF   OP Dialysis Orders: [facility] MWF4h 75.5kg=edw ?lower at dc  3k/2.0 Ca  Hep 4000 LUA AVFVenofer 100mg   q hd last dose 11/07   Op labs =HGB 10.2  ( 11/02)  Ca 9.2 phos 6.1 pth 213  Problem/Plan: 1.  ESRD - no hd need today K4,4 nad next HD mon 03/08/15 2. Atrial Fib. With RVR- Card to see / rate controlled/ on coumadin with pharmcy RX on Cardizem gtt/ 3. HTN/volume - bp 103/54 , cxr no Pulm edema , has bipedal edema and leaving below edw ,taper slowly avoid large drop in bp with card ussues / on Cardizem and MTP 4. HCAP- Per admit on empiric Doxycycline 5. Anemia - hgb 11.3  weely fe on hd , no esa  6. Secondary hyperparathyroidism - No vit d / on Renvela as binder 7. Right renal stone - per admit  Plans ( reports R flank pain only in er yesterday) nl wbc /no ua yet 8. Severe AS-s/p TAVR on 02/16/15 per admit and Card :Echo pending 9. Nonobstructive  CAD (8/17/176 cath )/HOCM- echo pend 10. Bladder cancer s/p bladder resection and surgery to recreate bladder pouch from intestine piece  Ernest Haber, PA-C [place] [phone] 03/06/2015,10:57 AM  LOS: 1 day   Pt seen, examined and agree w A/P as above.  Kelly Splinter MD [facility] pager [phone]    cell [phone] 03/06/2015, 4:56 PM    Labs: Basic Metabolic Panel:  Recent Labs Lab 03/01/15 0723 03/05/15 1726 03/06/15 0129  NA 135 138 136  K 4.1 3.6 4.4  CL 97* 94* 97*  CO2 25 29 28   GLUCOSE 103* 108* 114*  BUN 65* 16 23*  CREATININE 5.41* 2.54* 3.36*  CALCIUM 9.0 8.4* 8.7*  PHOS 6.6*  --   --    Liver Function Tests:  Recent Labs Lab 03/01/15 0723 03/06/15 0129  AST  --  97*  ALT  --  69*  ALKPHOS  --  73  BILITOT  --  1.4*  PROT  --  6.0*  ALBUMIN 2.8* 3.2*  Recent Labs Lab 03/05/15 2038  LIPASE 98*   No results for input(s): AMMONIA in the last 168 hours. CBC:  Recent Labs Lab 02/28/15 0330 03/01/15 0723 03/05/15 1726 03/06/15 0129  WBC 13.4* 13.4* 9.5 9.1  HGB 10.4* 9.9* 10.8* 11.3*  HCT 33.0* 30.8* 34.0* 35.8*  MCV 109.3* 106.9* 109.3* 110.5*  PLT 227 254 247 232   Cardiac Enzymes:  Recent Labs Lab 03/05/15 1807 03/05/15 2038 03/06/15 0129 03/06/15 0750  TROPONINI 0.30* 0.33* 0.39* 0.39*   CBG:  Recent Labs Lab 03/06/15 0745  GLUCAP 85    Studies/Results: Dg Chest Port 1 View  03/05/2015  CLINICAL DATA:  Dyspnea EXAM: PORTABLE CHEST 1 VIEW COMPARISON:  02/28/2015 chest radiograph FINDINGS: Stable aortic valve prosthesis. Stable cardiomediastinal silhouette with mild cardiomegaly. No pneumothorax. Small bilateral pleural effusions, increased bilaterally. No pulmonary edema. Mild hazy left lung base opacity. IMPRESSION: 1. Stable mild cardiomegaly without pulmonary edema. 2. Small bilateral pleural effusions, increased bilaterally. 3. Mild hazy left lung base opacity, favor  atelectasis. Electronically Signed   By: Ilona Sorrel M.D.   On: 03/05/2015 18:18   Ct Renal Stone Study  03/05/2015  CLINICAL DATA:  74 year old male with right-sided flank pain for the past hour. EXAM: CT ABDOMEN AND PELVIS WITHOUT CONTRAST TECHNIQUE: Multidetector CT imaging of the abdomen and pelvis was performed following the standard protocol without IV contrast. COMPARISON:  CT the abdomen and pelvis 05/17/2012. FINDINGS: Lower chest: Small to moderate bilateral pleural effusions lying dependently. This is associated with some passive subsegmental atelectasis in the lower lobes of the lungs bilaterally, and what appears to be some dependent airspace consolidation in the left lower lobe. Cardiomegaly. Extensive mitral annular calcifications. Atherosclerotic calcifications in the right coronary artery. Possible apical left ventricular patch noted. Hepatobiliary: Status post cholecystectomy. No discrete cystic or solid hepatic lesions are confidently identified on today's noncontrast CT examination. The liver has a slightly nodular contour, suggestive of cirrhosis. Pancreas: No pancreatic mass or peripancreatic inflammatory changes on today's noncontrast CT examination. Spleen: Unremarkable.  Small splenule inferior to the spleen. Adrenals/Urinary Tract: Adreniform thickening of the adrenal glands bilaterally. Atrophy of both kidneys. No urinary tract calculi. Calcifications in the renal pelvises bilaterally appear to be vascular. No hydroureteronephrosis. Status post radical cystectomy. Extensive calcifications and numerous surgical clips in the low anatomic pelvis, similar to prior studies. There appears to be a right lower quadrant ileal conduit. Stomach/Bowel: Unenhanced appearance of the stomach is normal. No pathologic dilatation of small bowel or colon. Status post partial right colectomy. Vascular/Lymphatic: Extensive atherosclerosis throughout the abdominal and pelvic vasculature, without evidence of  aneurysm. No lymphadenopathy noted in the abdomen or pelvis on today's noncontrast CT examination. Reproductive: It is unclear whether or not the prostate gland is diminutive or surgically absent. Other: Small amount of presacral edema and trace volume of ascites. No larger volume of ascites. No pneumoperitoneum. Musculoskeletal: There are no aggressive appearing lytic or blastic lesions noted in the visualized portions of the skeleton. 7 mm of anterolisthesis of L4 upon L5. IMPRESSION: 1. No acute findings in the abdomen or pelvis to account for the patient's symptoms. 2. There are small to moderate bilateral pleural effusions lying dependently, and there does appear to be some airspace consolidation in the left lower lobe, concerning for pneumonia or sequela of recent aspiration. 3. Cardiomegaly. 4. Morphologic changes in the liver suggestive of cirrhosis. 5. Status post radical cystectomy with what appears to be a right lower quadrant ileal conduit. 6. Additional incidental findings,  as above. Electronically Signed   By: Vinnie Langton M.D.   On: 03/05/2015 20:58   Medications: . diltiazem (CARDIZEM) infusion 7.5 mg/hr (03/06/15 0604)   . aspirin EC  81 mg Oral Daily  . dextromethorphan-guaiFENesin  1 tablet Oral BID  . diltiazem  180 mg Oral QHS  . doxycycline  100 mg Oral Q12H  . [START ON 03/08/2015] ferric gluconate (FERRLECIT/NULECIT) IV  125 mg Intravenous Q M,W,F-HD  . lipase/protease/amylase  48,000 Units Oral BID WC  . metoprolol tartrate  12.5 mg Oral BID  . multivitamin with minerals  1 tablet Oral Daily  . sevelamer carbonate  1,600 mg Oral TID WC  . warfarin  2 mg Oral ONCE-1800  . Warfarin - Pharmacist Dosing Inpatient   Does not apply [phone]

## 2015-03-06 NOTE — Progress Notes (Signed)
  Echocardiogram 2D Echocardiogram has been performed.  [name] 03/06/2015, 6:14 PM

## 2015-03-06 NOTE — Progress Notes (Signed)
ANTICOAGULATION CONSULT NOTE - Initial Consult  Pharmacy Consult for warfarin Indication: atrial fibrillation  Allergies  Allergen Reactions  . Amiodarone Other (See Comments)    Severely reduced DLCO  . Oxycodone Hcl Rash and Other (See Comments)    Rash and itching    Patient Measurements: Height: 5\' 8"  (172.7 cm) Weight: 162 lb 11.2 oz (73.8 kg) IBW/kg (Calculated) : 68.4  Vital Signs: Temp: 97.6 F (36.4 C) (11/05 0900) Temp Source: Oral (11/05 0900) BP: 103/54 mmHg (11/05 0430) Pulse Rate: 81 (11/05 0800)  Labs:  Recent Labs  03/05/15 1726  03/05/15 2038 03/06/15 0129 03/06/15 0750  HGB 10.8*  --   --  11.3*  --   HCT 34.0*  --   --  35.8*  --   PLT 247  --   --  232  --   LABPROT 31.4*  --   --  29.6*  --   INR 3.11*  --   --  2.87*  --   CREATININE 2.54*  --   --  3.36*  --   TROPONINI  --   < > 0.33* 0.39* 0.39*  < > = values in this interval not displayed.  Estimated Creatinine Clearance: 18.7 mL/min (by C-G formula based on Cr of 3.36).   Medical History: Past Medical History  Diagnosis Date  . Atrial fibrillation, persistent ([place])     a. cardioversion 11/21/10 . b. 05/2013 - experiencing more SOB. 24-hr holter showed avg HR 49, max 82, min 38bpm. ETT showed baseline junctional bradycardia, HR incr only to 90bpm with drop in BP. Saw EP - amiodarone reduced and eventually d/c'd with improvement in dyspnea.  . Hypertension   . Warfarin anticoagulation   . Aortic valve stenosis   . Orthostatic hypotension   . Diastolic dysfunction   . Arthritis   . Self-catheterizes urinary bladder   . Junctional bradycardia   . NSVT (nonsustained ventricular tachycardia) ([place])     a. By holter 01/2014.  [name] Shortness of breath dyspnea   . PVD (peripheral vascular disease) ([place])   . Cardiomyopathy ([place])     non-ischemic  . Hypomagnesemia   . A-V fistula ([place])   . CAD (coronary artery disease) 12/16/14    a. cath - nonobstrucive CAD 30% pro RCA; 25% dis RCA; 40T pro  Lcx  . Heart murmur   . Dysrhythmia   . CHF (congestive heart failure) ([place])   . Full dentures   . Uses hearing aid   . Chronic diarrhea     takes imodium BID  . Pneumonia     hx of  . Hemodialysis patient [facility])     M,W,F - dialysis initiated August 2016  . Bruises easily   . Bladder cancer ([place])     a. 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  . 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 CKD (chronic kidney disease), stage V ([place])     a. Followed by Dr. Jimmy Footman. b. IV fistula placed 04/17/14. c. initiation of HD August 2016  . RTA (renal tubular acidosis)   . ESRD (end stage renal disease) on dialysis [facility])     "MWF; [name]" (03/05/2015)   Assessment: 62 yom presented to the ED with afib. He is chronically anticoagulated on coumadin. INR is very slightly elevated on admit, now therapeutic 2.87. H/H is slightly low and platelets are WNL.  No bleeding noted. On doxycycline x 5 days started 11/4.  PTA dose was 2mg  daily but reduced to 1.5mg  daily to start 11/4 (last pta dose 11/3)  Goal of Therapy:  INR 2-3 Monitor platelets by anticoagulation protocol: Yes   Plan:  - Warfarin 2mg  x 1 dose tonight - Daily INR - Mon s/sx bleeding  Elicia Lamp, PharmD Clinical Pharmacist Pager [phone] 03/06/2015 9:48 AM

## 2015-03-06 NOTE — Discharge Instructions (Addendum)

## 2015-03-06 NOTE — Consult Note (Signed)
CARDIOLOGY CONSULT NOTE   Patient ID: [name] MRN: [id number] DOB/AGE: Aug 04, 1940 74 y.o.  Admit date: 03/05/2015  Primary Physician   [name], MD Primary Cardiologist   Dr. Radford Pax Reason for Consultation   afib with RVR and elevated troponin  HPI: [name] is a 74 y.o. male with a history of hypertension, persistant atrial fibrillation on Coumadin, ESRD-HD (MWF), recent TAVR on 02/16/15 for AS, PVD, CAD, chronic diastolic congestive heart failure, bladder cancer S/P bladder resection and surgically created pouch from colon, self catheter, paroxysmal V. tach, who admitted 03/05/15 with rapid heart rate and right-sided back pain.  The patient has been followed closely by the multidisciplinary heart valve team during and after recent admission for decompensated CHF (12/10/14 -12/22/14).  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 He was anticoagulated again with warfarin and TAVR rescheduled to 02/16/15.  The patient takes Cardizem CD 180 mg and lopressor 12.5 MG twice a day for rate control. On Coumadin for anticoagulation. Patient has been having intermittent episode of shortness of breath for the past few days. He was discharged from the hospital after an aortic valve repair on 10/31. He is currently in rehabilitation.  He is compliant with his medication. He has developed rapid heart rate during dialysis yesterday with a blood pressure of low (90/65) to high (160/95) per patient. EKG showed A. fib with RVR thus brought  to ED for further evaluation.  At arrival to emergency room, he was noted to have multiple transient few beats of V. Tach. Patient also had intermittent sharp left-sided back/flank pain. The pain was associated with nausea but no vomiting or diarrhea. The patient denies chest pain,  dizziness, syncope, melena, blood in his stool, orthopnea, PND, abdominal pain or cough.  In ED troponin was 0.3, EKG showed A. fib at rate of 136 bpm. Troponin of 0.3. Telemetry with A. fib at rate as high as 160-170. He was placed on IV diltiazem and now rate improved to 80s. Potassium 3.6, magnesium 2.1, creatinine 2.5 full, BUN 16. X-ray showed possible left base atelectasis, and a bilateral small pleural effusion. CT of abdomen showed no acute abdomen or pelvic abnormality however concerning for pneumonia.   Past Medical History  Diagnosis Date  . Atrial fibrillation, persistent ([place])     a. cardioversion 11/21/10 . b. 05/2013 - experiencing more SOB. 24-hr holter showed avg HR 49, max 82, min 38bpm. ETT showed baseline junctional bradycardia, HR incr only to 90bpm with drop in BP. Saw EP - amiodarone reduced and eventually d/c'd with improvement in dyspnea.  . Hypertension   . Warfarin anticoagulation   . Aortic valve stenosis   . Orthostatic hypotension   . Diastolic dysfunction   . Arthritis   . Self-catheterizes urinary bladder   . Junctional bradycardia   . NSVT (nonsustained ventricular tachycardia) ([place])     a. By holter 01/2014.  [name] Shortness of breath dyspnea   . PVD (peripheral vascular disease) ([place])   . Cardiomyopathy ([place])     non-ischemic  . Hypomagnesemia   . A-V fistula ([place])   . CAD (coronary artery disease) 12/16/14    a. cath - nonobstrucive CAD 30% pro RCA; 25% dis RCA; 40T pro Lcx  . Heart murmur   . Dysrhythmia   . CHF (congestive heart failure) ([place])   .  Full dentures   . Uses hearing aid   . Chronic diarrhea     takes imodium BID  . Pneumonia     hx of  . Hemodialysis patient [facility])     M,W,F - dialysis initiated August 2016  . Bruises easily   . Bladder cancer ([place])     a. 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  . 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 CKD (chronic kidney disease), stage V ([place])     a. Followed by Dr. Jimmy Footman. b. IV fistula placed 04/17/14. c. initiation of HD August 2016  . RTA (renal tubular acidosis)   . ESRD (end stage renal disease) on dialysis [facility])     "MWF; [name]" (03/05/2015)     Past Surgical History  Procedure Laterality Date  . Cardioversion  11/21/2010  . Cystectomy    . Cholecystectomy    . Cataract extraction w/ intraocular lens  implant, bilateral    . Colonoscopy    . Av fistula placement Left 04/17/2014    Procedure: ARTERIOVENOUS (AV) FISTULA CREATION- LEFT UPPER ARM ;  Surgeon: Mal [name], MD;  Location: [place];  Service: Vascular;  Laterality: Left;  . Bladder surgery      radical cystectomy  . Vascular surgery    . Multiple tooth extractions    . Cardiac catheterization N/A 12/16/2014    Procedure: Right/Left Heart Cath and Coronary Angiography;  Surgeon: [name], MD;  Location: [place] CV LAB;  Service: Cardiovascular;  Laterality: N/A;  . Eye surgery    . Colon surgery  Nov 2002    reconstructed bladder from colon. per pt. at [facility]  . Tee without cardioversion N/A 01/01/2015    Procedure: TRANSESOPHAGEAL ECHOCARDIOGRAM (TEE);  Surgeon: [name], MD;  Location: [facility] ENDOSCOPY;  Service: Cardiovascular;  Laterality: N/A;  . Transcatheter aortic valve replacement, transapical N/A 02/16/2015    Procedure: TRANSCATHETER AORTIC VALVE REPLACEMENT, TRANSAPICAL;  Surgeon: [name], MD;  Location: [place];  Service: Open Heart Surgery;  Laterality: N/A;  . Tee without cardioversion N/A 02/16/2015    Procedure: TRANSESOPHAGEAL ECHOCARDIOGRAM (TEE);  Surgeon: [name], MD;  Location: [place];  Service: Open Heart Surgery;  Laterality: N/A;    Allergies  Allergen Reactions  . Amiodarone Other (See Comments)    Severely reduced DLCO  . Oxycodone Hcl Rash and Other (See Comments)    Rash and itching    I have reviewed the patient's  current medications . aspirin EC  81 mg Oral Daily  . dextromethorphan-guaiFENesin  1 tablet Oral BID  . diltiazem  180 mg Oral QHS  . doxycycline  100 mg Oral Q12H  . [START ON 03/08/2015] ferric gluconate (FERRLECIT/NULECIT) IV  125 mg Intravenous Once  . lipase/protease/amylase  48,000 Units Oral BID WC  . metoprolol tartrate  12.5 mg Oral BID  . multivitamin with minerals  1 tablet Oral Daily  . sevelamer carbonate  1,600 mg Oral TID WC  . warfarin  2 mg Oral ONCE-1800  . Warfarin - Pharmacist Dosing Inpatient   Does not apply q1800   . diltiazem (CARDIZEM) infusion 7.5 mg/hr (03/06/15 0604)   acetaminophen, artificial tears, morphine injection, ondansetron (ZOFRAN) IV  Prior to Admission medications   Medication Sig Start Date End Date Taking? Authorizing Provider  acetaminophen (TYLENOL) 500 MG tablet Take 1,000 mg by  mouth every 6 (six) hours as needed for mild pain or moderate pain.   Yes Historical Provider, MD  aspirin 81 MG tablet Take 81 mg by mouth daily.   Yes Historical Provider, MD  B Complex-C-Folic Acid (RENAL VITAMIN PO) Take 1 tablet by mouth daily.   Yes Historical Provider, MD  diltiazem (CARDIZEM CD) 180 MG 24 hr capsule Take 180 mg by mouth at bedtime.   Yes Historical Provider, MD  ferric gluconate 125 mg in sodium chloride 0.9 % 100 mL Inject 125 mg into the vein every Monday, Wednesday, and Friday with hemodialysis. 03/01/15  Yes Donielle Liston Alba, PA-C  Hypromellose (ARTIFICIAL TEARS OP) Place 1 drop into both eyes daily as needed (rinse eyes).    Yes Historical Provider, MD  Loperamide HCl (IMODIUM PO) Take 1 tablet by mouth 2 (two) times daily.   Yes Historical Provider, MD  metoprolol tartrate (LOPRESSOR) 25 MG tablet Take 0.5 tablets (12.5 mg total) by mouth 2 (two) times daily. 03/01/15  Yes Donielle Liston Alba, PA-C  Multiple Vitamins-Minerals (MULTIVITAMIN WITH MINERALS) tablet Take 1 tablet by mouth daily.   Yes Historical Provider, MD  Pancrelipase,  Lip-Prot-Amyl, (CREON) 24000 UNITS CPEP Take 48,000 Units by mouth 2 (two) times daily with a meal.    Yes Historical Provider, MD  sevelamer carbonate (RENVELA) 800 MG tablet 1,600 mg 3 (three) times daily with meals.    Yes Historical Provider, MD  warfarin (COUMADIN) 3 MG tablet Take 1.5 mg by mouth daily. Supposed to start 03-04-15   Yes Historical Provider, MD     Social History   Social History  . Marital Status: Married    Spouse Name: N/A  . Number of Children: N/A  . Years of Education: N/A   Occupational History  . Not on file.   Social History Main Topics  . Smoking status: Former Smoker -- 2.00 packs/day for 33 years    Types: Cigarettes    Quit date: 05/02/1983  . Smokeless tobacco: Never Used  . Alcohol Use: Yes     Comment: rare  . Drug Use: No  . Sexual Activity: Not on file   Other Topics Concern  . Not on file   Social History Narrative    Family Status  Relation Status Death Age  . Mother Deceased   . Father Deceased 60  . Sister Deceased   . Brother Deceased   . Brother Alive    Family History  Problem Relation Age of Onset  . CVA Mother   . Heart attack Father   . Heart disease Father   . Arrhythmia Father   . Alzheimer's disease Sister   . Multiple sclerosis Brother   . Alcohol abuse Father       ROS:  Full 14 point review of systems complete and found to be negative unless listed above.  Physical Exam: Blood pressure 103/54, pulse 81, temperature 97.6 F (36.4 C), temperature source Oral, resp. rate 16, height 5\' 8"  (1.727 m), weight 162 lb 11.2 oz (73.8 kg), SpO2 98 %.  General: Well developed, well nourished, male in no acute distress Head: Eyes PERRLA, No xanthomas. Normocephalic and atraumatic, oropharynx without edema or exudate.  Lungs: Resp regular and unlabored, CTA. Heart:Ir Ir no s3, s4, or murmurs..   Neck: No carotid bruits. No lymphadenopathy.  JVD. Abdomen: Bowel sounds present, abdomen soft and non-tender without masses  or hernias noted. Msk:  No spine or cva tenderness. No weakness, no joint deformities or effusions.  Extremities: No clubbing, cyanosis. Trace LE edema. DP/PT/Radials 2+ and equal bilaterally. Neuro: Alert and oriented X 3. No focal deficits noted. Psych:  Good affect, responds appropriately Skin: No rashes or lesions noted.  Labs:   Lab Results  Component Value Date   WBC 9.1 03/06/2015   HGB 11.3* 03/06/2015   HCT 35.8* 03/06/2015   MCV 110.5* 03/06/2015   PLT 232 03/06/2015    Recent Labs  03/06/15 0129  INR 2.87*    Recent Labs Lab 03/06/15 0129  NA 136  K 4.4  CL 97*  CO2 28  BUN 23*  CREATININE 3.36*  CALCIUM 8.7*  PROT 6.0*  BILITOT 1.4*  ALKPHOS 73  ALT 69*  AST 97*  GLUCOSE 114*  ALBUMIN 3.2*   MAGNESIUM  Date Value Ref Range Status  03/05/2015 2.1 1.7 - 2.4 mg/dL Final    Recent Labs  03/05/15 1807 03/05/15 2038 03/06/15 0129 03/06/15 0750  TROPONINI 0.30* 0.33* 0.39* 0.39*   Echo: 02/18/15 LV EF: 55% -  60%  ------------------------------------------------------------------- Indications:   Aortic stenosis 424.1.  ------------------------------------------------------------------- History:  PMH:  Coronary artery disease. Congestive heart failure. Risk factors: Former tobacco use.  ------------------------------------------------------------------- Study Conclusions  - Left ventricle: The cavity size was normal. Wall thickness was increased in a pattern of moderate LVH. Systolic function was normal. The estimated ejection fraction was in the range of 55% to 60%. - Aortic valve: S/P 29 mm Sapien XT valve insertion. Intraop TEE with moderate peri valvular regurgitation. F/U now only appears mild Although best view in apical 5 chamber tech cut off regurgitant color flow All other views AR mild and improved from OR. - Mitral valve: Calcified annulus. There was mild regurgitation. - Left atrium: The atrium was  severely dilated. - Right ventricle: The cavity size was mildly dilated. - Right atrium: The atrium was mildly dilated. - Atrial septum: No defect or patent foramen ovale was identified.   ECG:   Vent. rate 136 BPM PR interval * ms QRS duration 107 ms QT/QTc 332/499 ms  Cardiac Cath: 12/16/14  Prox RCA lesion, 30% stenosed.  Dist RCA lesion, 25% stenosed.  Prox Cx lesion, 40% stenosed.  Assessment: 1. Calcified but nonobstructive coronary artery disease 2. 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 Dense calcification of the aortic valve annulus extending into the mitral valve annulus, and at least moderate calcification of the ascending aorta.  Radiology:  Dg Chest Port 1 View  03/05/2015  CLINICAL DATA:  Dyspnea EXAM: PORTABLE CHEST 1 VIEW COMPARISON:  02/28/2015 chest radiograph FINDINGS: Stable aortic valve prosthesis. Stable cardiomediastinal silhouette with mild cardiomegaly. No pneumothorax. Small bilateral pleural effusions, increased bilaterally. No pulmonary edema. Mild hazy left lung base opacity. IMPRESSION: 1. Stable mild cardiomegaly without pulmonary edema. 2. Small bilateral pleural effusions, increased bilaterally. 3. Mild hazy left lung base opacity, favor atelectasis. Electronically Signed   By: Ilona Sorrel M.D.   On: 03/05/2015 18:18   Ct Renal Stone Study  03/05/2015  CLINICAL DATA:  74 year old male with right-sided flank pain for the past hour. EXAM: CT ABDOMEN AND PELVIS WITHOUT CONTRAST TECHNIQUE: Multidetector CT imaging of the abdomen and pelvis was performed following the standard protocol without IV contrast. COMPARISON:  CT the abdomen and pelvis 05/17/2012. FINDINGS: Lower chest: Small to moderate bilateral pleural effusions lying dependently. This is associated with some passive subsegmental atelectasis in the lower lobes of the lungs bilaterally, and what appears to be some dependent airspace consolidation in the left lower  lobe.  Cardiomegaly. Extensive mitral annular calcifications. Atherosclerotic calcifications in the right coronary artery. Possible apical left ventricular patch noted. Hepatobiliary: Status post cholecystectomy. No discrete cystic or solid hepatic lesions are confidently identified on today's noncontrast CT examination. The liver has a slightly nodular contour, suggestive of cirrhosis. Pancreas: No pancreatic mass or peripancreatic inflammatory changes on today's noncontrast CT examination. Spleen: Unremarkable.  Small splenule inferior to the spleen. Adrenals/Urinary Tract: Adreniform thickening of the adrenal glands bilaterally. Atrophy of both kidneys. No urinary tract calculi. Calcifications in the renal pelvises bilaterally appear to be vascular. No hydroureteronephrosis. Status post radical cystectomy. Extensive calcifications and numerous surgical clips in the low anatomic pelvis, similar to prior studies. There appears to be a right lower quadrant ileal conduit. Stomach/Bowel: Unenhanced appearance of the stomach is normal. No pathologic dilatation of small bowel or colon. Status post partial right colectomy. Vascular/Lymphatic: Extensive atherosclerosis throughout the abdominal and pelvic vasculature, without evidence of aneurysm. No lymphadenopathy noted in the abdomen or pelvis on today's noncontrast CT examination. Reproductive: It is unclear whether or not the prostate gland is diminutive or surgically absent. Other: Small amount of presacral edema and trace volume of ascites. No larger volume of ascites. No pneumoperitoneum. Musculoskeletal: There are no aggressive appearing lytic or blastic lesions noted in the visualized portions of the skeleton. 7 mm of anterolisthesis of L4 upon L5. IMPRESSION: 1. No acute findings in the abdomen or pelvis to account for the patient's symptoms. 2. There are small to moderate bilateral pleural effusions lying dependently, and there does appear to be some airspace  consolidation in the left lower lobe, concerning for pneumonia or sequela of recent aspiration. 3. Cardiomegaly. 4. Morphologic changes in the liver suggestive of cirrhosis. 5. Status post radical cystectomy with what appears to be a right lower quadrant ileal conduit. 6. Additional incidental findings, as above. Electronically Signed   By: Vinnie Langton M.D.   On: 03/05/2015 20:58    ASSESSMENT AND PLAN:      1. Atrial fibrillation with RVR:  - Patient has a persistent A. fib. The patient takes Cardizem CD 180 mg and lopressor 12.5 MG twice a day for rate control. On Coumadin for anticoagulation.  - Admitted with A. fib RVR. Rate currently improved to 80s on IV diltiazem 7.65ml/hr.  - Continue coumadin per pharmacy.  INR of 2.87.  - He has developed rapid heart rate during dialysis yesterday with a blood pressure of low (90/65) to high (160/95) per patient. Last BP reading of 104/49 during exam.  - TSH normal. Pending repeat echo.  - Will stop Iv dilt and start short acting cardizem 90mg  q 6 hours.   2. Minimally elevated troponin:  - Likely demand ischemia setting of A. fib RVR. Trend 0.3->0.33->0.39->0.39.  - nonobstructive coronary artery disease by LHC on 12/16/14 - Continue ASA and BB  3. ESRD:  - Per Nephrology  - hemodialysis-M,W,F  4. Chronic diastolic CHF:  - compensated-volume managed with HD  5. Severe AS-s/p TAVR on 02/16/15: - Echo pending  6. HCAP:  - Per primary - On empiric doxycycline   7. Minimally elevated lipase:  - Per primary  8.  Essential hypertension - As above  9.  HOCM (hypertrophic obstructive cardiomyopathy) - Continue BB  10. High MCV, MCH and RDW - per primary   Signed: Bhagat,Bhavinkumar, PA 03/06/2015, 12:33 PM Pager [phone]  Co-Sign MD  Patient seen and examined with Vin Bhagat, PA-C. We discussed all aspects of the encounter. I agree with  the assessment and plan as stated above.   Difficult situation. Main issue is chronic AF  with intermittent RVR. Not sure of why HR jumped back up. AV node blockade limited by low BPs in HD. Has failed amio about 2 years ago with severe dyspnea and fatigue. Will try increasing cardizem to 90 q 6 and weaning drip but I suspect he is heading toward AV node ablation.   Latalia Etzler,MD 1:51 PM

## 2015-03-06 NOTE — Progress Notes (Signed)
PATIENT DETAILS Name: [name] Age: 74 y.o. Sex: male Date of Birth: 09-Mar-1941 Admit Date: 03/05/2015 Admitting Physician Ivor Costa, MD ERX:[id number] [name], MD  Subjective: Still in A. fib-heart rate in the 80s-on diltiazem drip. Denies any ongoing shortness of breath or chest pain.  Assessment/Plan: Principal Problem: Atrial fibrillation with RVR: Currently rate controlled with Cardizem gtt her, he is anticoagulated with Coumadin-INR therapeutic-Coumadin managed by pharmacy while inpatient. Cardiology consulted for further assistance.  Active Problems: ESRD: Nephrology consulted-hemodialysis-M,W,F  Minimally elevated troponin: Likely demand ischemia setting of A. fib RVR. Will manage medically.  HCAP: Small consolidation seen in CT renal stone study-without fever or leukocytosis-patient seems to be asymptomatic-but did complain of some mild upper back pain-Will continue with empiric doxycycline for now and follow.  Minimally elevated lipase: Pancreas appears noninflamed on CT renal stone study-start diet and follow. 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  MEDICATIONS: Scheduled Meds: . aspirin EC  81 mg Oral Daily  . dextromethorphan-guaiFENesin  1 tablet Oral BID  . diltiazem  180 mg Oral QHS  . doxycycline  100 mg Oral Q12H  . [START ON 03/08/2015] ferric gluconate (FERRLECIT/NULECIT) IV  125 mg Intravenous Q M,W,F-HD  . lipase/protease/amylase  48,000 Units Oral BID WC  . metoprolol tartrate  12.5 mg Oral BID  . multivitamin with minerals  1 tablet Oral Daily  . sevelamer carbonate  1,600 mg Oral TID WC  . warfarin  2 mg Oral ONCE-1800  . Warfarin - Pharmacist Dosing Inpatient   Does not apply q1800   Continuous Infusions: . diltiazem (CARDIZEM) infusion 7.5 mg/hr (03/06/15 0604)   PRN Meds:.acetaminophen, artificial tears, morphine injection, ondansetron (ZOFRAN) IV    PHYSICAL EXAM: Vital signs in last 24 hours: Filed Vitals:   03/06/15 0514 03/06/15 0555 03/06/15 0800 03/06/15 0900  BP:      Pulse: 81 75 81   Temp:    97.6 F (36.4 C)  TempSrc:    Oral  Resp: 25 15 16    Height:      Weight:      SpO2: 82% 82% 98%     Weight change:  Filed Weights   03/05/15 1713 03/06/15 0430  Weight: 73.936 kg (163 lb) 73.8 kg (162 lb 11.2 oz)   Body mass index is 24.74 kg/(m^2).   Gen Exam: Awake and alert with clear speech.  Neck: Supple, No JVD.   Chest: B/L Clear.   CVS: S1 S2 irregular, no murmurs.  Abdomen:  soft, BS +, non tender, non distended.  Extremities: no edema, lower extremities warm to touch Neurologic: Non Focal.   Skin: No Rash.   Wounds: N/A.    Intake/Output from previous day: No intake or output data in the 24 hours ending 03/06/15 1100   LAB RESULTS: CBC  Recent Labs Lab 02/28/15 0330 03/01/15 0723 03/05/15 1726 03/06/15 0129  WBC 13.4* 13.4* 9.5 9.1  HGB 10.4* 9.9* 10.8*  11.3*  HCT 33.0* 30.8* 34.0* 35.8*  PLT 227 254 247 232  MCV 109.3* 106.9* 109.3* 110.5*  MCH 34.4* 34.4* 34.7* 34.9*  MCHC 31.5 32.1 31.8 31.6  RDW 17.5* 17.5* 18.7* 19.1*    Chemistries   Recent Labs Lab 03/01/15 0723 03/05/15 1726 03/05/15 1849 03/06/15 0129  NA 135 138  --  136  K 4.1 3.6  --  4.4  CL 97* 94*  --  97*  CO2 25 29  --  28  GLUCOSE 103* 108*  --  114*  BUN 65* 16  --  23*  CREATININE 5.41* 2.54*  --  3.36*  CALCIUM 9.0 8.4*  --  8.7*  MG  --   --  2.1  --     CBG:  Recent Labs Lab 03/06/15 0745  GLUCAP 85    GFR Estimated Creatinine Clearance: 18.7 mL/min (by C-G formula based on Cr of 3.36).  Coagulation profile  Recent Labs Lab 02/28/15 0330 03/01/15 0226 03/05/15 1726 03/06/15 0129  INR 2.93* 2.96* 3.11* 2.87*    Cardiac Enzymes  Recent Labs Lab 03/05/15 2038 03/06/15 0129 03/06/15 0750  TROPONINI 0.33* 0.39* 0.39*    Invalid input(s): POCBNP No results for input(s): DDIMER in the last 72 hours. No results for input(s): HGBA1C in the last 72 hours. No results for input(s): CHOL, HDL, LDLCALC, TRIG, CHOLHDL, LDLDIRECT in the last 72 hours.  Recent Labs  03/06/15 0129  TSH 2.583   No results for input(s): VITAMINB12, FOLATE, FERRITIN, TIBC, IRON, RETICCTPCT in the last 72 hours.  Recent Labs  03/05/15 2038  LIPASE 98*    Urine Studies No results for input(s): UHGB, CRYS in the last 72 hours.  Invalid input(s): UACOL, UAPR, USPG, UPH, UTP, UGL, UKET, UBIL, UNIT, UROB, ULEU, UEPI, UWBC, URBC, UBAC, CAST, UCOM, BILUA  MICROBIOLOGY: Recent Results (from the past 240 hour(s))  MRSA PCR Screening     Status: None   Collection Time: 03/05/15 10:07 PM  Result Value Ref Range Status   MRSA by PCR NEGATIVE NEGATIVE Final    Comment:        The GeneXpert MRSA Assay (FDA approved for NASAL specimens only), is one component of a comprehensive MRSA colonization surveillance program. It is not intended to diagnose  MRSA infection nor to guide or monitor treatment for MRSA infections.     RADIOLOGY STUDIES/RESULTS: Dg Chest 2 View  02/28/2015  CLINICAL DATA:  CHF, post heart surgery, SOB EXAM: CHEST - 2 VIEW COMPARISON:  02/24/2015 FINDINGS: Stable mild cardiomegaly. Atheromatous tortuous thoracic aorta. Changes of AVR. Small bilateral pleural effusions stable. Mild interstitial edema or infiltrates predominately in the lung bases, slightly increased. No pneumothorax. Spurring in the lower thoracic spine. IMPRESSION: 1. Slight increase in bilateral interstitial edema or infiltrates. 2. Stable cardiomegaly and tiny effusions. Electronically Signed   By: Lucrezia Europe M.D.   On: 02/28/2015 08:13   Dg Chest 2 View  02/24/2015  CLINICAL DATA:  Congestive failure EXAM: CHEST - 2 VIEW COMPARISON:  02/23/2015 FINDINGS: Cardiac shadow is  enlarged but stable. Stent based aortic valve replacement is again seen. Mild vascular congestion and interstitial changes are noted consistent with CHF. The right chest tube is been removed in the interval. A tiny right pneumothorax remains in the apex but stable. No new focal infiltrate is seen. IMPRESSION: Tiny right pneumothorax is noted. Changes of congestive failure Electronically Signed   By: Inez Catalina M.D.   On: 02/24/2015 08:00   Dg Chest 2 View  02/11/2015  CLINICAL DATA:  Transcatheter aortic valve replacement ; history of COPD, aortic stenosis, CHF, chronic renal insufficiency. EXAM: CHEST  2 VIEW COMPARISON:  PA and lateral chest x-ray of December 30, 2014 ; CT scan of the chest dated December 18, 2014 FINDINGS: The lungs are mildly hyperinflated. The interstitial markings are coarse. There is evidence of previous granulomatous infection. The heart is top-normal in size. There is calcification of the mitral valvular annulus. The pulmonary vascularity is normal. There is mild tortuosity of the descending thoracic aorta. There is calcification in the aortic arch. There is no  pleural effusion. The bony thorax exhibits no acute abnormality. IMPRESSION: COPD with chronic pulmonary fibrotic changes and previous granulomatous infection. Top-normal cardiac size without evidence of pulmonary edema. No significant pleural effusion. Electronically Signed   By: David  Martinique M.D.   On: 02/11/2015 10:02   Dg Chest Port 1 View  03/05/2015  CLINICAL DATA:  Dyspnea EXAM: PORTABLE CHEST 1 VIEW COMPARISON:  02/28/2015 chest radiograph FINDINGS: Stable aortic valve prosthesis. Stable cardiomediastinal silhouette with mild cardiomegaly. No pneumothorax. Small bilateral pleural effusions, increased bilaterally. No pulmonary edema. Mild hazy left lung base opacity. IMPRESSION: 1. Stable mild cardiomegaly without pulmonary edema. 2. Small bilateral pleural effusions, increased bilaterally. 3. Mild hazy left lung base opacity, favor atelectasis. Electronically Signed   By: Ilona Sorrel M.D.   On: 03/05/2015 18:18   Dg Chest Port 1 View  02/23/2015  CLINICAL DATA:  Status post transcatheter aortic valve replacement EXAM: PORTABLE CHEST 1 VIEW COMPARISON:  Portable chest x-ray of February 22, 2015 FINDINGS: There is a persistent tiny right apical pneumothorax. The pleural line overlies the posterior aspect of the third rib. The right-sided chest tube is unchanged in position with its tip overlying the posterior aspect of the ninth rib. There is no large pleural effusion. The cardiac silhouette remains enlarged. The pulmonary vascularity is less engorged. The interstitial markings remain increased but have improved. There is mild subsegmental atelectasis partially obscuring the left heart border. The left internal jugular venous catheter tip projects at the junction of the right and left brachiocephalic veins. IMPRESSION: 1. Persistent tiny right apical pneumothorax. The right-sided chest tube is unchanged in position. 2. Decreased pulmonary interstitial edema. Persistent left basilar subsegmental  atelectasis. Electronically Signed   By: David  Martinique M.D.   On: 02/23/2015 07:44   Dg Chest Port 1 View  02/22/2015  CLINICAL DATA:  Check chest tube placement EXAM: PORTABLE CHEST - 1 VIEW COMPARISON:  02/21/2015 FINDINGS: Cardiac shadow remains enlarged. Patient has undergone prior TAVR procedure. Bilateral chest tubes remain in place. No significant pneumothorax is noted on the left. A tiny right-sided pneumothorax is seen. Right jugular and left jugular central lines are again noted and stable. Bony structures are within normal limits. No new focal abnormality is seen. IMPRESSION: Tiny right apical pneumothorax. Chest tubes in satisfactory position. Tubes and lines as described above. Status post TAVR Electronically Signed   By: Inez Catalina M.D.   On: 02/22/2015 09:59   Dg  Chest Port 1 View  02/21/2015  CLINICAL DATA:  Bilateral chest tubes EXAM: PORTABLE CHEST 1 VIEW COMPARISON:  02/20/2015 FINDINGS: Stable right chest tube. Stable left chest tube versus mediastinal drain. No pneumothorax is seen. Cardiomegaly. Pulmonary vascular congestion without frank interstitial edema. Small left pleural effusion. Stable right IJ venous sheath. Stable left IJ dual lumen venous catheter terminating in the distal left brachiocephalic vein. Prosthetic aortic valve. IMPRESSION: Stable chest tube/drains.  No pneumothorax is seen. Cardiomegaly with pulmonary vascular congestion. No frank interstitial edema. Small left pleural effusion. Additional stable support apparatus as above. Electronically Signed   By: Julian Hy M.D.   On: 02/21/2015 09:11   Dg Chest Port 1 View  02/20/2015  CLINICAL DATA:  Congestive heart failure.  Chest tubes. EXAM: PORTABLE CHEST 1 VIEW COMPARISON:  02/19/2015 FINDINGS: Bilateral jugular catheters and chest tubes are unchanged. Cardiac silhouette remains enlarged. Sequelae of prior transcatheter aortic valve replacement are again identified. Thoracic aortic calcification is  noted. There are likely small bilateral pleural effusions. The retrocardiac left lower lobe opacity is unchanged. There is minimal right basilar atelectasis. No overt pulmonary edema or pneumothorax is seen. IMPRESSION: 1. Small pleural effusions and left greater than right basilar atelectasis. 2. No pneumothorax. Electronically Signed   By: Logan Bores M.D.   On: 02/20/2015 08:01   Dg Chest Port 1 View  02/19/2015  CLINICAL DATA:  Right pleural effusion.  Chest tube in place EXAM: PORTABLE CHEST 1 VIEW COMPARISON:  02/19/2015 FINDINGS: Cardiomegaly again noted. Stable left IJ catheter position. Stable right IJ sheath position. Right chest tube. The right pleural effusion has resolved. No pulmonary edema. No pneumothorax. Persistent small left pleural effusion left basilar atelectasis. IMPRESSION: Right pleural effusion has resolved. No pulmonary edema. Right chest tube in place. No pneumothorax Electronically Signed   By: Lahoma Crocker M.D.   On: 02/19/2015 11:35   Dg Chest Port 1 View  02/19/2015  CLINICAL DATA:  Congestive heart failure. EXAM: PORTABLE CHEST 1 VIEW COMPARISON:  02/18/2015. FINDINGS: Right IJ line, left IJ line in stable position. Prior cardiac valve replacement. Cardiomegaly with diffuse bilateral pulmonary interstitial infiltrates and bilateral pleural effusions. No interim change. Findings consistent with congestive heart failure . IMPRESSION: 1. Bilateral IJ lines in stable position. 2. Prior cardiac valve replacement. Persistent cardiomegaly pulmonary venous congestion with persistent bilateral prominent pulmonary interstitium and bilateral pleural effusions consistent with persistent congestive heart failure. No interim change from prior exam . Electronically Signed   By: Marcello Moores  Register   On: 02/19/2015 08:01   Dg Chest Port 1 View  02/18/2015  CLINICAL DATA:  Placement of left IJ hemodialysis catheter. EXAM: PORTABLE CHEST 1 VIEW COMPARISON:  02/18/2015 FINDINGS: The left IJ  dialysis catheter tip is in the mid SVC likely against the lateral wall of the SVC. The right IJ Cordis is still in place. Persistent cardiac enlargement, pulmonary edema, pleural effusions and bibasilar atelectasis. IMPRESSION: Left IJ catheter tip is in the mid SVC against the lateral wall of the SVC. Right IJ Cordis in good position. Persistent cardiac enlargement, pulmonary edema, pleural effusions and atelectasis. Electronically Signed   By: Marijo Sanes M.D.   On: 02/18/2015 13:48   Dg Chest Port 1 View  02/18/2015  CLINICAL DATA:  Post aortic valve replacement, followup EXAM: PORTABLE CHEST 1 VIEW COMPARISON:  Portable chest x-ray of 02/17/2015 and 02/16/2015 FINDINGS: The lungs are not as well aerated and there is little change to slight worsening of probable CHF pattern with  cardiomegaly, pulmonary vascular congestion, and effusions. The Swan-Ganz catheter has been removed and a venous sheath remains in the SVC. Aortic valve replacement is noted. IMPRESSION: Diminished aeration with probable slight worsening of CHF pattern with effusions. Electronically Signed   By: Ivar Drape M.D.   On: 02/18/2015 08:14   Dg Chest Port 1 View  02/17/2015  CLINICAL DATA:  Transcatheter aortic valve replacement EXAM: PORTABLE CHEST 1 VIEW COMPARISON:  02/16/2015 FINDINGS: Interval extubation. Cardiomegaly with pulmonary vascular congestion and suspected mild interstitial edema. Layering right pleural effusion. Left lung base is obscured. Left chest tube.  No pneumothorax is seen. Prior IJ Swan-Ganz catheter terminates in the main pulmonary artery. Prosthetic aortic valve. IMPRESSION: Interval extubation. Cardiomegaly with mild interstitial edema and layering right pleural effusion. Left chest tube.  No pneumothorax is seen. Electronically Signed   By: Julian Hy M.D.   On: 02/17/2015 08:12   Dg Chest Port 1 View  02/16/2015  CLINICAL DATA:  TAVR postop EXAM: PORTABLE CHEST 1 VIEW COMPARISON:  02/11/2015  FINDINGS: Aortic valve replacement in satisfactory position. Endotracheal tube in good position. Swan-Ganz catheter tip in the right lower pulmonary artery. Cardiac enlargement with mild vascular congestion. Left lower lobe atelectasis. No significant effusion. IMPRESSION: Postop TAVR. Mild vascular congestion. Left lower lobe atelectasis. Electronically Signed   By: Franchot Gallo M.D.   On: 02/16/2015 11:36   Ct Renal Stone Study  03/05/2015  CLINICAL DATA:  74 year old male with right-sided flank pain for the past hour. EXAM: CT ABDOMEN AND PELVIS WITHOUT CONTRAST TECHNIQUE: Multidetector CT imaging of the abdomen and pelvis was performed following the standard protocol without IV contrast. COMPARISON:  CT the abdomen and pelvis 05/17/2012. FINDINGS: Lower chest: Small to moderate bilateral pleural effusions lying dependently. This is associated with some passive subsegmental atelectasis in the lower lobes of the lungs bilaterally, and what appears to be some dependent airspace consolidation in the left lower lobe. Cardiomegaly. Extensive mitral annular calcifications. Atherosclerotic calcifications in the right coronary artery. Possible apical left ventricular patch noted. Hepatobiliary: Status post cholecystectomy. No discrete cystic or solid hepatic lesions are confidently identified on today's noncontrast CT examination. The liver has a slightly nodular contour, suggestive of cirrhosis. Pancreas: No pancreatic mass or peripancreatic inflammatory changes on today's noncontrast CT examination. Spleen: Unremarkable.  Small splenule inferior to the spleen. Adrenals/Urinary Tract: Adreniform thickening of the adrenal glands bilaterally. Atrophy of both kidneys. No urinary tract calculi. Calcifications in the renal pelvises bilaterally appear to be vascular. No hydroureteronephrosis. Status post radical cystectomy. Extensive calcifications and numerous surgical clips in the low anatomic pelvis, similar to prior  studies. There appears to be a right lower quadrant ileal conduit. Stomach/Bowel: Unenhanced appearance of the stomach is normal. No pathologic dilatation of small bowel or colon. Status post partial right colectomy. Vascular/Lymphatic: Extensive atherosclerosis throughout the abdominal and pelvic vasculature, without evidence of aneurysm. No lymphadenopathy noted in the abdomen or pelvis on today's noncontrast CT examination. Reproductive: It is unclear whether or not the prostate gland is diminutive or surgically absent. Other: Small amount of presacral edema and trace volume of ascites. No larger volume of ascites. No pneumoperitoneum. Musculoskeletal: There are no aggressive appearing lytic or blastic lesions noted in the visualized portions of the skeleton. 7 mm of anterolisthesis of L4 upon L5. IMPRESSION: 1. No acute findings in the abdomen or pelvis to account for the patient's symptoms. 2. There are small to moderate bilateral pleural effusions lying dependently, and there does appear to be some  airspace consolidation in the left lower lobe, concerning for pneumonia or sequela of recent aspiration. 3. Cardiomegaly. 4. Morphologic changes in the liver suggestive of cirrhosis. 5. Status post radical cystectomy with what appears to be a right lower quadrant ileal conduit. 6. Additional incidental findings, as above. Electronically Signed   By: Vinnie Langton M.D.   On: 03/05/2015 20:58    Oren Binet, MD  [facility] Pager:[phone]  If 7PM-7AM, please contact night-coverage [email or web address] Password [email or web address] 03/06/2015, 11:00 AM   LOS: 1 day

## 2015-03-06 NOTE — Clinical Social Work Note (Addendum)
Clinical Social Work Assessment  Patient Details  Name: [name] MRN: [id number] Date of Birth: [birth year]  Date of referral:  03/06/15               Reason for consult:  Discharge Planning                Permission sought to share information with:  Case Manager, Facility Sport and exercise psychologist, Family Supports Permission granted to share information::  Yes, Verbal Permission Granted  Name::      Bernita Raisin Tine)  Agency::   (SNF's )  Relationship::   (Spouse )  Contact Information:   [phone])  Housing/Transportation Living arrangements for the past 2 months:  [place] of Information:  Patient Patient Interpreter Needed:  None Criminal Activity/Legal Involvement Pertinent to Current Situation/Hospitalization:  No - Comment as needed Significant Relationships:  Spouse Lives with:  Spouse Do you feel safe going back to the place where you live?  No Need for family participation in patient care:  No (Coment)  Care giving concerns: Admitted from [place].   Social Worker assessment / plan:  CSW met with patient at bedside to confirm pt was admitted from [place]. Pt confirmed that he has been at [facility] for the past 5 days and wishes to return at discharge. Pt stated [facility] is holding a bed. Pt in good spirits and eager to be released. No further concerns reported at this time. CSW remains available as needed.   Employment status:  Retired Forensic scientist:  Medicare PT Recommendations:  Not assessed at this time [place] / Referral to community resources:  [place]  Patient/Family's Response to care:  Pt sitting at bedside, alert and oriented x4. Pt agreeable to returning to [place] at d/c. Pt telling jokes and in good spirits during assessment. Pt also pleasant and appreciated visit from social work.   Patient/Family's Understanding of and Emotional Response to Diagnosis,  Current Treatment, and Prognosis: Pt knowledgeable of medical work up and on-going treatment.  Emotional Assessment Appearance:  Appears stated age Attitude/Demeanor/Rapport:   (Pleasant ) Affect (typically observed):  Accepting, Appropriate, Pleasant Orientation:  Oriented to Situation, Oriented to  Time, Oriented to Place, Oriented to Self Alcohol / Substance use:  Not Applicable Psych involvement (Current and /or in the community):  No (Comment)  Discharge Needs  Concerns to be addressed:  Care Coordination Readmission within the last 30 days:  Yes Current discharge risk:  Dependent with Mobility Barriers to Discharge:  Continued Medical Work up   [employer], MSW, LCSWA [phone] 03/06/2015 5:52 PM

## 2015-03-07 DIAGNOSIS — Z954 Presence of other heart-valve replacement: Secondary | ICD-10-CM

## 2015-03-07 LAB — RAPID URINE DRUG SCREEN, HOSP PERFORMED
Amphetamines: NOT DETECTED
Barbiturates: NOT DETECTED
Benzodiazepines: NOT DETECTED
Cocaine: NOT DETECTED
OPIATES: NOT DETECTED
Tetrahydrocannabinol: NOT DETECTED

## 2015-03-07 LAB — PROTIME-INR
INR: 3.35 — AB (ref 0.00–1.49)
Prothrombin Time: 33.3 seconds — ABNORMAL HIGH (ref 11.6–15.2)

## 2015-03-07 LAB — GLUCOSE, CAPILLARY: Glucose-Capillary: 117 mg/dL — ABNORMAL HIGH (ref 65–99)

## 2015-03-07 MED ORDER — DILTIAZEM HCL ER COATED BEADS 360 MG PO CP24: 360.0000 mg | ORAL_CAPSULE | Freq: Every day | ORAL | Status: DC

## 2015-03-07 MED ORDER — WARFARIN SODIUM 3 MG PO TABS: 1.5000 mg | ORAL_TABLET | Freq: Every day | ORAL | Status: DC

## 2015-03-07 MED ORDER — DOXYCYCLINE HYCLATE 100 MG PO TABS: 100.0000 mg | ORAL_TABLET | Freq: Two times a day (BID) | ORAL | Status: DC

## 2015-03-07 MED ORDER — WARFARIN SODIUM 1 MG PO TABS: 1.0000 mg | ORAL_TABLET | Freq: Once | ORAL | Status: DC

## 2015-03-07 NOTE — NC FL2 (Signed)
[place] LEVEL OF CARE SCREENING TOOL     IDENTIFICATION  Patient Name: [name] Birthdate: 10-Nov-1940 Sex: male Admission Date (Current Location): 03/05/2015  [facility] and [place] Number: Herbalist and Address:  [facility] [place], [place] [address] [place], [place] [address]      Provider Number: [id number]  Attending Physician Name and Address:  Jonetta Osgood, MD  Relative Name and Phone Number:       Current Level of Care: Hospital Recommended Level of Care: [place] Prior Approval Number:    Date Approved/Denied:   PASRR Number: [id number]  Discharge Plan: SNF    Current Diagnoses: Patient Active Problem List   Diagnosis Date Noted  . CAD (coronary artery disease) 03/05/2015  . ESRD (end stage renal disease) on dialysis ([place]) 03/05/2015  . Back pain 03/05/2015  . Elevated troponin 03/05/2015  . S/P TAVR (transcatheter aortic valve replacement) 02/16/2015  . Severe aortic stenosis 12/30/2014  . Bilateral edema of lower extremity   . Chronic anticoagulation 12/16/2014  . Acute on chronic diastolic heart failure ([place]) 12/10/2014  . Dyspnea 07/09/2014  . Atrial fibrillation with RVR ([place]) 06/19/2014  . Metabolic acidosis   . Pleural effusion   . Junctional bradycardia   . Chronic kidney disease, stage V ([place]) 03/30/2014  . Ventricular tachycardia (paroxysmal) ([place]) 02/26/2014  . Chronic diastolic CHF, class 3 18/29/9371  . Bradycardia 05/26/2013  . Orthostatic hypotension 05/02/2013  . Aortic stenosis, severe 05/02/2013  . PULMONARY FUNCTION TESTS, ABNORMAL 02/05/2008  . BLADDER CANCER 02/04/2008  . Essential hypertension 02/04/2008  . HOCM (hypertrophic obstructive cardiomyopathy) ([place]) 02/04/2008    Orientation ACTIVITIES/SOCIAL BLADDER RESPIRATION     (AOx4)    Continent O2 (As needed)  BEHAVIORAL SYMPTOMS/MOOD NEUROLOGICAL BOWEL NUTRITION STATUS      Continent Diet (cardiac)   PHYSICIAN VISITS COMMUNICATION OF NEEDS Height & Weight Skin    Verbally 5\' 8"  (172.7 cm) 175 lbs. Surgical wounds          AMBULATORY STATUS RESPIRATION    Supervision limited O2 (As needed)      Personal Care Assistance Level of Assistance  Bathing, Dressing Bathing Assistance: Limited assistance   Dressing Assistance: Limited assistance      Functional Limitations Info                [place]  PT (By licensed PT)     PT Frequency: 5xweek             Additional Factors Info  Allergies               Current Medications (03/07/2015): Current Facility-Administered Medications  Medication Dose Route Frequency Provider Last Rate Last Dose  . acetaminophen (TYLENOL) tablet 1,000 mg  1,000 mg Oral Q8H PRN Ivor Costa, MD      . artificial tears (LACRILUBE) ophthalmic ointment   Both Eyes Daily PRN Ivor Costa, MD      . aspirin EC tablet 81 mg  81 mg Oral Daily Ivor Costa, MD   81 mg at 03/07/15 0758  . dextromethorphan-guaiFENesin (MUCINEX DM) 30-600 MG per 12 hr tablet 1 tablet  1 tablet Oral BID Ivor Costa, MD   1 tablet at 03/07/15 0758  . diltiazem (CARDIZEM) tablet 90 mg  90 mg Oral 4 times per day Bhavinkumar Bhagat, PA   90 mg at 03/07/15 0552  . doxycycline (VIBRA-TABS) tablet 100 mg  100 mg Oral Q12H  Ivor Costa, MD   100 mg at 03/07/15 0758  . [START ON 03/08/2015] ferric gluconate (NULECIT) 125 mg in sodium chloride 0.9 % 100 mL IVPB  125 mg Intravenous Once Ernest Haber, PA-C      . lipase/protease/amylase (CREON) capsule 48,000 Units  48,000 Units Oral BID WC Ivor Costa, MD   48,000 Units at 03/07/15 0757  . loperamide (IMODIUM) capsule 2 mg  2 mg Oral PRN Jonetta Osgood, MD   2 mg at 03/07/15 0149  . metoprolol tartrate (LOPRESSOR) tablet 12.5 mg  12.5 mg Oral BID Ivor Costa, MD   12.5 mg at 03/07/15 0757  . morphine 2 MG/ML injection 2 mg  2 mg Intravenous Q4H PRN Ivor Costa, MD      . multivitamin with minerals tablet 1 tablet  1  tablet Oral Daily Ivor Costa, MD   1 tablet at 03/07/15 0757  . ondansetron (ZOFRAN) injection 4 mg  4 mg Intravenous Q8H PRN Ivor Costa, MD      . sevelamer carbonate (RENVELA) tablet 1,600 mg  1,600 mg Oral TID WC Ivor Costa, MD   1,600 mg at 03/07/15 0757  . Warfarin - Pharmacist Dosing Inpatient   Does not apply q1800 Para March, RPH   0  at 03/06/15 1800   Do not use this list as official medication orders. Please verify with discharge summary.  Discharge Medications:   Medication List    TAKE these medications        acetaminophen 500 MG tablet  Commonly known as:  TYLENOL  Take 1,000 mg by mouth every 6 (six) hours as needed for mild pain or moderate pain.     ARTIFICIAL TEARS OP  Place 1 drop into both eyes daily as needed (rinse eyes).     aspirin 81 MG tablet  Take 81 mg by mouth daily.     CREON 24000 UNITS Cpep  Generic drug:  Pancrelipase (Lip-Prot-Amyl)  Take 48,000 Units by mouth 2 (two) times daily with a meal.     diltiazem 360 MG 24 hr capsule  Commonly known as:  CARDIZEM CD  Take 1 capsule (360 mg total) by mouth at bedtime.     doxycycline 100 MG tablet  Commonly known as:  VIBRA-TABS  Take 1 tablet (100 mg total) by mouth every 12 (twelve) hours. For 3 more days from 03/07/15     ferric gluconate 125 mg in sodium chloride 0.9 % 100 mL  Inject 125 mg into the vein every Monday, Wednesday, and Friday with hemodialysis.     IMODIUM PO  Take 1 tablet by mouth 2 (two) times daily.     metoprolol tartrate 25 MG tablet  Commonly known as:  LOPRESSOR  Take 0.5 tablets (12.5 mg total) by mouth 2 (two) times daily.     multivitamin with minerals tablet  Take 1 tablet by mouth daily.     RENAL VITAMIN PO  Take 1 tablet by mouth daily.     sevelamer carbonate 800 MG tablet  Commonly known as:  RENVELA  1,600 mg 3 (three) times daily with meals.     warfarin 3 MG tablet  Commonly known as:  COUMADIN  Take 0.5 tablets (1.5 mg total) by mouth daily  at 6 PM. start 03-08-15  Start taking on:  03/08/2015        Relevant Imaging Results:  Relevant Lab Results:  Recent Labs    Additional Information    Tziporah Knoke, Daneil Dolin, LCSW

## 2015-03-07 NOTE — Progress Notes (Signed)
Subjective:    Feels great. Wants to go home. HR 85-105 on dilt 90 q6.     Intake/Output Summary (Last 24 hours) at 03/07/15 0856 Last data filed at 03/07/15 0500  Gross per 24 hour  Intake    330 ml  Output     90 ml  Net    240 ml    Current meds: . aspirin EC  81 mg Oral Daily  . dextromethorphan-guaiFENesin  1 tablet Oral BID  . diltiazem  90 mg Oral 4 times per day  . doxycycline  100 mg Oral Q12H  . [START ON 03/08/2015] ferric gluconate (FERRLECIT/NULECIT) IV  125 mg Intravenous Once  . lipase/protease/amylase  48,000 Units Oral BID WC  . metoprolol tartrate  12.5 mg Oral BID  . multivitamin with minerals  1 tablet Oral Daily  . sevelamer carbonate  1,600 mg Oral TID WC  . Warfarin - Pharmacist Dosing Inpatient   Does not apply q1800   Infusions:     Objective:  Blood pressure 109/51, pulse 72, temperature 97.8 F (36.6 C), temperature source Oral, resp. rate 20, height 5\' 8"  (1.727 m), weight 74.934 kg (165 lb 3.2 oz), SpO2 94 %. Weight change: 0.998 kg (2 lb 3.2 oz)  Physical Exam:  General: Well developed, well nourished, male in no acute distress Head: Eyes PERRLA, No xanthomas. Normocephalic and atraumatic, oropharynx without edema or exudate.  Lungs: Resp regular and unlabored, CTA. Heart:Ir Ir no s3, s4, or murmurs..  Neck: No carotid bruits. No lymphadenopathy. JVD. Abdomen: Bowel sounds present, abdomen soft and non-tender without masses or hernias noted. Msk: No spine or cva tenderness. No weakness, no joint deformities or effusions. Extremities: No clubbing, cyanosis. Trace LE edema. DP/PT/Radials 2+ and equal bilaterally. Neuro: Alert and oriented X 3. No focal deficits noted. Psych: Good affect, responds appropriately Skin: No rashes or lesions noted.  Telemetry:  AF 85-105  Lab Results: Basic Metabolic Panel:  Recent Labs Lab 03/01/15 0723 03/05/15 1726 03/05/15 1849 03/06/15 0129  NA 135 138  --  136  K 4.1 3.6  --  4.4  CL  97* 94*  --  97*  CO2 25 29  --  28  GLUCOSE 103* 108*  --  114*  BUN 65* 16  --  23*  CREATININE 5.41* 2.54*  --  3.36*  CALCIUM 9.0 8.4*  --  8.7*  MG  --   --  2.1  --   PHOS 6.6*  --   --   --    Liver Function Tests:  Recent Labs Lab 03/01/15 0723 03/06/15 0129  AST  --  97*  ALT  --  69*  ALKPHOS  --  73  BILITOT  --  1.4*  PROT  --  6.0*  ALBUMIN 2.8* 3.2*    Recent Labs Lab 03/05/15 2038  LIPASE 98*   No results for input(s): AMMONIA in the last 168 hours. CBC:  Recent Labs Lab 03/01/15 0723 03/05/15 1726 03/06/15 0129  WBC 13.4* 9.5 9.1  HGB 9.9* 10.8* 11.3*  HCT 30.8* 34.0* 35.8*  MCV 106.9* 109.3* 110.5*  PLT 254 247 232   Cardiac Enzymes:  Recent Labs Lab 03/05/15 1807 03/05/15 2038 03/06/15 0129 03/06/15 0750  TROPONINI 0.30* 0.33* 0.39* 0.39*   BNP: Invalid input(s): POCBNP CBG:  Recent Labs Lab 03/06/15 0745 03/07/15 0739  GLUCAP 85 117*   Microbiology: No results found for: CULT No results for input(s): CULT, SDES in the last 168  hours.  Imaging: Dg Chest Port 1 View  03/05/2015  CLINICAL DATA:  Dyspnea EXAM: PORTABLE CHEST 1 VIEW COMPARISON:  02/28/2015 chest radiograph FINDINGS: Stable aortic valve prosthesis. Stable cardiomediastinal silhouette with mild cardiomegaly. No pneumothorax. Small bilateral pleural effusions, increased bilaterally. No pulmonary edema. Mild hazy left lung base opacity. IMPRESSION: 1. Stable mild cardiomegaly without pulmonary edema. 2. Small bilateral pleural effusions, increased bilaterally. 3. Mild hazy left lung base opacity, favor atelectasis. Electronically Signed   By: Ilona Sorrel M.D.   On: 03/05/2015 18:18   Ct Renal Stone Study  03/05/2015  CLINICAL DATA:  74 year old male with right-sided flank pain for the past hour. EXAM: CT ABDOMEN AND PELVIS WITHOUT CONTRAST TECHNIQUE: Multidetector CT imaging of the abdomen and pelvis was performed following the standard protocol without IV contrast.  COMPARISON:  CT the abdomen and pelvis 05/17/2012. FINDINGS: Lower chest: Small to moderate bilateral pleural effusions lying dependently. This is associated with some passive subsegmental atelectasis in the lower lobes of the lungs bilaterally, and what appears to be some dependent airspace consolidation in the left lower lobe. Cardiomegaly. Extensive mitral annular calcifications. Atherosclerotic calcifications in the right coronary artery. Possible apical left ventricular patch noted. Hepatobiliary: Status post cholecystectomy. No discrete cystic or solid hepatic lesions are confidently identified on today's noncontrast CT examination. The liver has a slightly nodular contour, suggestive of cirrhosis. Pancreas: No pancreatic mass or peripancreatic inflammatory changes on today's noncontrast CT examination. Spleen: Unremarkable.  Small splenule inferior to the spleen. Adrenals/Urinary Tract: Adreniform thickening of the adrenal glands bilaterally. Atrophy of both kidneys. No urinary tract calculi. Calcifications in the renal pelvises bilaterally appear to be vascular. No hydroureteronephrosis. Status post radical cystectomy. Extensive calcifications and numerous surgical clips in the low anatomic pelvis, similar to prior studies. There appears to be a right lower quadrant ileal conduit. Stomach/Bowel: Unenhanced appearance of the stomach is normal. No pathologic dilatation of small bowel or colon. Status post partial right colectomy. Vascular/Lymphatic: Extensive atherosclerosis throughout the abdominal and pelvic vasculature, without evidence of aneurysm. No lymphadenopathy noted in the abdomen or pelvis on today's noncontrast CT examination. Reproductive: It is unclear whether or not the prostate gland is diminutive or surgically absent. Other: Small amount of presacral edema and trace volume of ascites. No larger volume of ascites. No pneumoperitoneum. Musculoskeletal: There are no aggressive appearing lytic or  blastic lesions noted in the visualized portions of the skeleton. 7 mm of anterolisthesis of L4 upon L5. IMPRESSION: 1. No acute findings in the abdomen or pelvis to account for the patient's symptoms. 2. There are small to moderate bilateral pleural effusions lying dependently, and there does appear to be some airspace consolidation in the left lower lobe, concerning for pneumonia or sequela of recent aspiration. 3. Cardiomegaly. 4. Morphologic changes in the liver suggestive of cirrhosis. 5. Status post radical cystectomy with what appears to be a right lower quadrant ileal conduit. 6. Additional incidental findings, as above. Electronically Signed   By: Vinnie Langton M.D.   On: 03/05/2015 20:58     ASSESSMENT:  1. AF with RVR 2. AS s/p TAVR 3. ESRD   PLAN/DISCUSSION:   HR improved with increased dose diltiazem. Rates now 85-105. Will send home on dilt 360 daily. If AF rates back up will need to consider AVN ablation. Intolerant of amio in past. Will arrange f/u in cardiology clinic in 1 week. D/w Dr. Sloan Leiter.    LOS: 2 days    Glori Bickers, MD 03/07/2015, 8:56 AM

## 2015-03-07 NOTE — Discharge Summary (Signed)
PATIENT DETAILS Name: [name] Age: 74 y.o. Sex: male Date of Birth: 03/22/1941 MRN: [id number]. Admitting Physician: [name] Costa, MD KY[facility]:CWCBJ,SEGBTD [name], MD  Admit Date: 03/05/2015 Discharge date: 03/07/2015  Recommendations for Outpatient Follow-up:  1. Please ensure follow up with cardiology 2. Resume Coumadin on 11/7 3. Ensure follow up with hemodialysis (M,W,F) 4. Repeat PT/INR in 2 days and adjust coumadin accordingly  5. Echo pending-please follow official report  PRIMARY DISCHARGE DIAGNOSIS:  Principal Problem:   Atrial fibrillation with RVR (HCC) Active Problems:   Essential hypertension   HOCM (hypertrophic obstructive cardiomyopathy) (HCC)   Chronic diastolic CHF, class 3   Ventricular tachycardia (paroxysmal) (HCC)   S/P TAVR (transcatheter aortic valve replacement)   CAD (coronary artery disease)   ESRD (end stage renal disease) on dialysis (HCC)   Back pain   Elevated troponin      PAST MEDICAL HISTORY: Past Medical History  Diagnosis Date  . Atrial fibrillation, persistent ([place])     a. cardioversion 11/21/10 . b. 05/2013 - experiencing more SOB. 24-hr holter showed avg HR 49, max 82, min 38bpm. ETT showed baseline junctional bradycardia, HR incr only to 90bpm with drop in BP. Saw EP - amiodarone reduced and eventually d/c'd with improvement in dyspnea.  . Hypertension   . Warfarin anticoagulation   . Aortic valve stenosis   . Orthostatic hypotension   . Diastolic dysfunction   . Arthritis   . Self-catheterizes urinary bladder   . Junctional bradycardia   . NSVT (nonsustained ventricular tachycardia) ([place])     a. By holter 01/2014.  [name] Shortness of breath dyspnea   . PVD (peripheral vascular disease) ([place])   . Cardiomyopathy ([place])     non-ischemic  . Hypomagnesemia   . A-V fistula ([place])   . CAD (coronary artery disease) 12/16/14    a. cath - nonobstrucive CAD 30% pro RCA; 25% dis RCA; 40T pro Lcx  . Heart murmur   . Dysrhythmia   . CHF  (congestive heart failure) ([place])   . Full dentures   . Uses hearing aid   . Chronic diarrhea     takes imodium BID  . Pneumonia     hx of  . Hemodialysis patient [facility])     M,W,F - dialysis initiated August 2016  . Bruises easily   . Bladder cancer ([place])     a. 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  . 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 CKD (chronic kidney disease), stage V ([place])     a. Followed by Dr. Jimmy [name]. b. IV fistula placed 04/17/14. c. initiation of HD August 2016  . RTA (renal tubular acidosis)   . ESRD (end stage renal disease) on dialysis [facility])     "MWF; [name]" (03/05/2015)    DISCHARGE MEDICATIONS: Current Discharge Medication List    START taking these medications   Details  doxycycline (VIBRA-TABS) 100 MG tablet Take 1 tablet (100 mg total) by mouth every 12 (twelve) hours. For 3 more days from 03/07/15      CONTINUE these medications which have CHANGED   Details  diltiazem (CARDIZEM CD) 360 MG 24 hr capsule Take 1 capsule (360 mg total) by mouth at bedtime.    warfarin (COUMADIN) 3 MG tablet Take 0.5 tablets (1.5 mg total) by mouth daily at 6 PM. start 03-08-15  CONTINUE these medications which have NOT CHANGED   Details  acetaminophen (TYLENOL) 500 MG tablet Take 1,000 mg by mouth every 6 (six) hours as needed for mild pain or moderate pain.    aspirin 81 MG tablet Take 81 mg by mouth daily.    B Complex-C-Folic Acid (RENAL VITAMIN PO) Take 1 tablet by mouth daily.    ferric gluconate 125 mg in sodium chloride 0.9 % 100 mL Inject 125 mg into the vein every Monday, Wednesday, and Friday with hemodialysis.    Hypromellose (ARTIFICIAL TEARS OP) Place 1 drop into both eyes daily as needed (rinse eyes).     Loperamide HCl (IMODIUM PO) Take 1 tablet by mouth 2 (two) times daily.    metoprolol tartrate (LOPRESSOR)  25 MG tablet Take 0.5 tablets (12.5 mg total) by mouth 2 (two) times daily. Qty: 60 tablet, Refills: 1    Multiple Vitamins-Minerals (MULTIVITAMIN WITH MINERALS) tablet Take 1 tablet by mouth daily.    Pancrelipase, Lip-Prot-Amyl, (CREON) 24000 UNITS CPEP Take 48,000 Units by mouth 2 (two) times daily with a meal.     sevelamer carbonate (RENVELA) 800 MG tablet 1,600 mg 3 (three) times daily with meals.         ALLERGIES:   Allergies  Allergen Reactions  . Amiodarone Other (See Comments)    Severely reduced DLCO  . Oxycodone Hcl Rash and Other (See Comments)    Rash and itching    BRIEF HPI:  See H&P, Labs, Consult and Test reports for all details in brief, is a 74 y.o. male with PMH of hypertension, atrial fibrillation on Coumadin, ESRD-HD (M WF), recent TAVR on 02/16/15, PVD, CAD, chronic diastolic congestive heart failure, bladder cancer (S A/P bladder resection and surgically created pouch from colon), self catheter, paroxysmal V. tach, who presented with Afib RVR  CONSULTATIONS:   cardiology and nephrology  PERTINENT RADIOLOGIC STUDIES: Dg Chest 2 View  02/28/2015  CLINICAL DATA:  CHF, post heart surgery, SOB EXAM: CHEST - 2 VIEW COMPARISON:  02/24/2015 FINDINGS: Stable mild cardiomegaly. Atheromatous tortuous thoracic aorta. Changes of AVR. Small bilateral pleural effusions stable. Mild interstitial edema or infiltrates predominately in the lung bases, slightly increased. No pneumothorax. Spurring in the lower thoracic spine. IMPRESSION: 1. Slight increase in bilateral interstitial edema or infiltrates. 2. Stable cardiomegaly and tiny effusions. Electronically Signed   By: [name] M.D.   On: 02/28/2015 08:13   Dg Chest 2 View  02/24/2015  CLINICAL DATA:  Congestive failure EXAM: CHEST - 2 VIEW COMPARISON:  02/23/2015 FINDINGS: Cardiac shadow is enlarged but stable. Stent based aortic valve replacement is again seen. Mild vascular congestion and interstitial changes are  noted consistent with CHF. The right chest tube is been removed in the interval. A tiny right pneumothorax remains in the apex but stable. No new focal infiltrate is seen. IMPRESSION: Tiny right pneumothorax is noted. Changes of congestive failure Electronically Signed   By: Inez Catalina M.D.   On: 02/24/2015 08:00   Dg Chest 2 View  02/11/2015  CLINICAL DATA:  Transcatheter aortic valve replacement ; history of COPD, aortic stenosis, CHF, chronic renal insufficiency. EXAM: CHEST  2 VIEW COMPARISON:  PA and lateral chest x-ray of December 30, 2014 ; CT scan of the chest dated December 18, 2014 FINDINGS: The lungs are mildly hyperinflated. The interstitial markings are coarse. There is evidence of previous granulomatous infection. The heart is top-normal in size. There is calcification of the mitral valvular annulus. The pulmonary vascularity is  normal. There is mild tortuosity of the descending thoracic aorta. There is calcification in the aortic arch. There is no pleural effusion. The bony thorax exhibits no acute abnormality. IMPRESSION: COPD with chronic pulmonary fibrotic changes and previous granulomatous infection. Top-normal cardiac size without evidence of pulmonary edema. No significant pleural effusion. Electronically Signed   By: David  Martinique M.D.   On: 02/11/2015 10:02   Dg Chest Port 1 View  03/05/2015  CLINICAL DATA:  Dyspnea EXAM: PORTABLE CHEST 1 VIEW COMPARISON:  02/28/2015 chest radiograph FINDINGS: Stable aortic valve prosthesis. Stable cardiomediastinal silhouette with mild cardiomegaly. No pneumothorax. Small bilateral pleural effusions, increased bilaterally. No pulmonary edema. Mild hazy left lung base opacity. IMPRESSION: 1. Stable mild cardiomegaly without pulmonary edema. 2. Small bilateral pleural effusions, increased bilaterally. 3. Mild hazy left lung base opacity, favor atelectasis. Electronically Signed   By: Ilona Sorrel M.D.   On: 03/05/2015 18:18   Dg Chest Port 1  View  02/23/2015  CLINICAL DATA:  Status post transcatheter aortic valve replacement EXAM: PORTABLE CHEST 1 VIEW COMPARISON:  Portable chest x-ray of February 22, 2015 FINDINGS: There is a persistent tiny right apical pneumothorax. The pleural line overlies the posterior aspect of the third rib. The right-sided chest tube is unchanged in position with its tip overlying the posterior aspect of the ninth rib. There is no large pleural effusion. The cardiac silhouette remains enlarged. The pulmonary vascularity is less engorged. The interstitial markings remain increased but have improved. There is mild subsegmental atelectasis partially obscuring the left heart border. The left internal jugular venous catheter tip projects at the junction of the right and left brachiocephalic veins. IMPRESSION: 1. Persistent tiny right apical pneumothorax. The right-sided chest tube is unchanged in position. 2. Decreased pulmonary interstitial edema. Persistent left basilar subsegmental atelectasis. Electronically Signed   By: David  Martinique M.D.   On: 02/23/2015 07:44   Dg Chest Port 1 View  02/22/2015  CLINICAL DATA:  Check chest tube placement EXAM: PORTABLE CHEST - 1 VIEW COMPARISON:  02/21/2015 FINDINGS: Cardiac shadow remains enlarged. Patient has undergone prior TAVR procedure. Bilateral chest tubes remain in place. No significant pneumothorax is noted on the left. A tiny right-sided pneumothorax is seen. Right jugular and left jugular central lines are again noted and stable. Bony structures are within normal limits. No new focal abnormality is seen. IMPRESSION: Tiny right apical pneumothorax. Chest tubes in satisfactory position. Tubes and lines as described above. Status post TAVR Electronically Signed   By: Inez Catalina M.D.   On: 02/22/2015 09:59   Dg Chest Port 1 View  02/21/2015  CLINICAL DATA:  Bilateral chest tubes EXAM: PORTABLE CHEST 1 VIEW COMPARISON:  02/20/2015 FINDINGS: Stable right chest tube. Stable  left chest tube versus mediastinal drain. No pneumothorax is seen. Cardiomegaly. Pulmonary vascular congestion without frank interstitial edema. Small left pleural effusion. Stable right IJ venous sheath. Stable left IJ dual lumen venous catheter terminating in the distal left brachiocephalic vein. Prosthetic aortic valve. IMPRESSION: Stable chest tube/drains.  No pneumothorax is seen. Cardiomegaly with pulmonary vascular congestion. No frank interstitial edema. Small left pleural effusion. Additional stable support apparatus as above. Electronically Signed   By: Julian Hy M.D.   On: 02/21/2015 09:11   Dg Chest Port 1 View  02/20/2015  CLINICAL DATA:  Congestive heart failure.  Chest tubes. EXAM: PORTABLE CHEST 1 VIEW COMPARISON:  02/19/2015 FINDINGS: Bilateral jugular catheters and chest tubes are unchanged. Cardiac silhouette remains enlarged. Sequelae of prior transcatheter aortic valve replacement  are again identified. Thoracic aortic calcification is noted. There are likely small bilateral pleural effusions. The retrocardiac left lower lobe opacity is unchanged. There is minimal right basilar atelectasis. No overt pulmonary edema or pneumothorax is seen. IMPRESSION: 1. Small pleural effusions and left greater than right basilar atelectasis. 2. No pneumothorax. Electronically Signed   By: Logan Bores M.D.   On: 02/20/2015 08:01   Dg Chest Port 1 View  02/19/2015  CLINICAL DATA:  Right pleural effusion.  Chest tube in place EXAM: PORTABLE CHEST 1 VIEW COMPARISON:  02/19/2015 FINDINGS: Cardiomegaly again noted. Stable left IJ catheter position. Stable right IJ sheath position. Right chest tube. The right pleural effusion has resolved. No pulmonary edema. No pneumothorax. Persistent small left pleural effusion left basilar atelectasis. IMPRESSION: Right pleural effusion has resolved. No pulmonary edema. Right chest tube in place. No pneumothorax Electronically Signed   By: Lahoma Crocker M.D.   On:  02/19/2015 11:35   Dg Chest Port 1 View  02/19/2015  CLINICAL DATA:  Congestive heart failure. EXAM: PORTABLE CHEST 1 VIEW COMPARISON:  02/18/2015. FINDINGS: Right IJ line, left IJ line in stable position. Prior cardiac valve replacement. Cardiomegaly with diffuse bilateral pulmonary interstitial infiltrates and bilateral pleural effusions. No interim change. Findings consistent with congestive heart failure . IMPRESSION: 1. Bilateral IJ lines in stable position. 2. Prior cardiac valve replacement. Persistent cardiomegaly pulmonary venous congestion with persistent bilateral prominent pulmonary interstitium and bilateral pleural effusions consistent with persistent congestive heart failure. No interim change from prior exam . Electronically Signed   By: Marcello Moores  Register   On: 02/19/2015 08:01   Dg Chest Port 1 View  02/18/2015  CLINICAL DATA:  Placement of left IJ hemodialysis catheter. EXAM: PORTABLE CHEST 1 VIEW COMPARISON:  02/18/2015 FINDINGS: The left IJ dialysis catheter tip is in the mid SVC likely against the lateral wall of the SVC. The right IJ Cordis is still in place. Persistent cardiac enlargement, pulmonary edema, pleural effusions and bibasilar atelectasis. IMPRESSION: Left IJ catheter tip is in the mid SVC against the lateral wall of the SVC. Right IJ Cordis in good position. Persistent cardiac enlargement, pulmonary edema, pleural effusions and atelectasis. Electronically Signed   By: Marijo Sanes M.D.   On: 02/18/2015 13:48   Dg Chest Port 1 View  02/18/2015  CLINICAL DATA:  Post aortic valve replacement, followup EXAM: PORTABLE CHEST 1 VIEW COMPARISON:  Portable chest x-ray of 02/17/2015 and 02/16/2015 FINDINGS: The lungs are not as well aerated and there is little change to slight worsening of probable CHF pattern with cardiomegaly, pulmonary vascular congestion, and effusions. The Swan-Ganz catheter has been removed and a venous sheath remains in the SVC. Aortic valve replacement is  noted. IMPRESSION: Diminished aeration with probable slight worsening of CHF pattern with effusions. Electronically Signed   By: Ivar Drape M.D.   On: 02/18/2015 08:14   Dg Chest Port 1 View  02/17/2015  CLINICAL DATA:  Transcatheter aortic valve replacement EXAM: PORTABLE CHEST 1 VIEW COMPARISON:  02/16/2015 FINDINGS: Interval extubation. Cardiomegaly with pulmonary vascular congestion and suspected mild interstitial edema. Layering right pleural effusion. Left lung base is obscured. Left chest tube.  No pneumothorax is seen. Prior IJ Swan-Ganz catheter terminates in the main pulmonary artery. Prosthetic aortic valve. IMPRESSION: Interval extubation. Cardiomegaly with mild interstitial edema and layering right pleural effusion. Left chest tube.  No pneumothorax is seen. Electronically Signed   By: Julian Hy M.D.   On: 02/17/2015 08:12   Dg Chest Port 1  View  02/16/2015  CLINICAL DATA:  TAVR postop EXAM: PORTABLE CHEST 1 VIEW COMPARISON:  02/11/2015 FINDINGS: Aortic valve replacement in satisfactory position. Endotracheal tube in good position. Swan-Ganz catheter tip in the right lower pulmonary artery. Cardiac enlargement with mild vascular congestion. Left lower lobe atelectasis. No significant effusion. IMPRESSION: Postop TAVR. Mild vascular congestion. Left lower lobe atelectasis. Electronically Signed   By: Franchot Gallo M.D.   On: 02/16/2015 11:36   Ct Renal Stone Study  03/05/2015  CLINICAL DATA:  74 year old male with right-sided flank pain for the past hour. EXAM: CT ABDOMEN AND PELVIS WITHOUT CONTRAST TECHNIQUE: Multidetector CT imaging of the abdomen and pelvis was performed following the standard protocol without IV contrast. COMPARISON:  CT the abdomen and pelvis 05/17/2012. FINDINGS: Lower chest: Small to moderate bilateral pleural effusions lying dependently. This is associated with some passive subsegmental atelectasis in the lower lobes of the lungs bilaterally, and what appears  to be some dependent airspace consolidation in the left lower lobe. Cardiomegaly. Extensive mitral annular calcifications. Atherosclerotic calcifications in the right coronary artery. Possible apical left ventricular patch noted. Hepatobiliary: Status post cholecystectomy. No discrete cystic or solid hepatic lesions are confidently identified on today's noncontrast CT examination. The liver has a slightly nodular contour, suggestive of cirrhosis. Pancreas: No pancreatic mass or peripancreatic inflammatory changes on today's noncontrast CT examination. Spleen: Unremarkable.  Small splenule inferior to the spleen. Adrenals/Urinary Tract: Adreniform thickening of the adrenal glands bilaterally. Atrophy of both kidneys. No urinary tract calculi. Calcifications in the renal pelvises bilaterally appear to be vascular. No hydroureteronephrosis. Status post radical cystectomy. Extensive calcifications and numerous surgical clips in the low anatomic pelvis, similar to prior studies. There appears to be a right lower quadrant ileal conduit. Stomach/Bowel: Unenhanced appearance of the stomach is normal. No pathologic dilatation of small bowel or colon. Status post partial right colectomy. Vascular/Lymphatic: Extensive atherosclerosis throughout the abdominal and pelvic vasculature, without evidence of aneurysm. No lymphadenopathy noted in the abdomen or pelvis on today's noncontrast CT examination. Reproductive: It is unclear whether or not the prostate gland is diminutive or surgically absent. Other: Small amount of presacral edema and trace volume of ascites. No larger volume of ascites. No pneumoperitoneum. Musculoskeletal: There are no aggressive appearing lytic or blastic lesions noted in the visualized portions of the skeleton. 7 mm of anterolisthesis of L4 upon L5. IMPRESSION: 1. No acute findings in the abdomen or pelvis to account for the patient's symptoms. 2. There are small to moderate bilateral pleural effusions  lying dependently, and there does appear to be some airspace consolidation in the left lower lobe, concerning for pneumonia or sequela of recent aspiration. 3. Cardiomegaly. 4. Morphologic changes in the liver suggestive of cirrhosis. 5. Status post radical cystectomy with what appears to be a right lower quadrant ileal conduit. 6. Additional incidental findings, as above. Electronically Signed   By: Vinnie Langton M.D.   On: 03/05/2015 20:58     PERTINENT LAB RESULTS: CBC:  Recent Labs  03/05/15 1726 03/06/15 0129  WBC 9.5 9.1  HGB 10.8* 11.3*  HCT 34.0* 35.8*  PLT 247 232   CMET CMP     Component Value Date/Time   NA 136 03/06/2015 0129   K 4.4 03/06/2015 0129   CL 97* 03/06/2015 0129   CO2 28 03/06/2015 0129   GLUCOSE 114* 03/06/2015 0129   BUN 23* 03/06/2015 0129   CREATININE 3.36* 03/06/2015 0129   CALCIUM 8.7* 03/06/2015 0129   PROT 6.0* 03/06/2015 0129  ALBUMIN 3.2* 03/06/2015 0129   AST 97* 03/06/2015 0129   ALT 69* 03/06/2015 0129   ALKPHOS 73 03/06/2015 0129   BILITOT 1.4* 03/06/2015 0129   GFRNONAA 17* 03/06/2015 0129   GFRAA 19* 03/06/2015 0129    GFR Estimated Creatinine Clearance: 18.7 mL/min (by C-G formula based on Cr of 3.36).  Recent Labs  03/05/15 2038  LIPASE 98*    Recent Labs  03/05/15 2038 03/06/15 0129 03/06/15 0750  TROPONINI 0.33* 0.39* 0.39*   Invalid input(s): POCBNP No results for input(s): DDIMER in the last 72 hours. No results for input(s): HGBA1C in the last 72 hours. No results for input(s): CHOL, HDL, LDLCALC, TRIG, CHOLHDL, LDLDIRECT in the last 72 hours.  Recent Labs  03/06/15 0129  TSH 2.583   No results for input(s): VITAMINB12, FOLATE, FERRITIN, TIBC, IRON, RETICCTPCT in the last 72 hours. Coags:  Recent Labs  03/06/15 0129 03/07/15 0213  INR 2.87* 3.35*   Microbiology: Recent Results (from the past 240 hour(s))  MRSA PCR Screening     Status: None   Collection Time: 03/05/15 10:07 PM  Result Value  Ref Range Status   MRSA by PCR NEGATIVE NEGATIVE Final    Comment:        The GeneXpert MRSA Assay (FDA approved for NASAL specimens only), is one component of a comprehensive MRSA colonization surveillance program. It is not intended to diagnose MRSA infection nor to guide or monitor treatment for MRSA infections.      BRIEF HOSPITAL COURSE:  Atrial fibrillation with RVR: Required Cardizem gtt, once rate controlled-transitioned to oral cardizem. Spoke with Cardiology-Dr Bensimhon, who recommended discharge on Cardizem 360 mg daily. Cardiology will arrange for outpatient follow up. INR Supratherapeutic at 3.35-will hold coumadin today and dose on 11/7. Please check INR in 2 days.     Active Problems: ESRD: Nephrology consulted-hemodialysis-M,W,F-please ensure patient follow with HD center at his usual schedule  Minimally elevated troponin: Likely demand ischemia setting of A. fib RVR. Will manage medically.  HCAP: Small consolidation seen in CT renal stone study-without fever or leukocytosis-patient seems to be asymptomatic-but did complain of some mild upper back pain-Will continue with empiric doxycycline for a few more days  Minimally elevated lipase: Pancreas appears noninflamed on CT renal stone study-start diet and follow. Note patient denies any abdominal pain, abdomen is soft and nontender. Tolerating regular diet  Essential hypertension:Controlled On Cardizem and metoprolol  HOCM (hypertrophic obstructive cardiomyopathy): continue Metoprolol  Chronic diastolic CHF: compensated-volume managed with HD  Nonobstructive coronary artery disease by LHC on 12/16/14: Continue aspirin, beta blocker  Severe AS-s/p TAVR on 02/16/15:Echo pending-but recent echo on 10/20 reviewed-stable  Bladder cancer s/p bladder resection and surgery to recreate pouch from colon  TODAY-DAY OF DISCHARGE:  Subjective:   [name] today has no headache,no chest abdominal pain,no new weakness  tingling or numbness, feels much better wants to go home today.  Objective:   Blood pressure 109/51, pulse 72, temperature 97.8 F (36.6 C), temperature source Oral, resp. rate 20, height 5\' 8"  (1.727 m), weight 74.934 kg (165 lb 3.2 oz), SpO2 94 %.  Intake/Output Summary (Last 24 hours) at 03/07/15 0849 Last data filed at 03/07/15 0500  Gross per 24 hour  Intake    330 ml  Output     90 ml  Net    240 ml   Filed Weights   03/05/15 1713 03/06/15 0430 03/07/15 0400  Weight: 73.936 kg (163 lb) 73.8 kg (162 lb 11.2 oz)  74.934 kg (165 lb 3.2 oz)    Exam Awake Alert, Oriented *3, No new F.N deficits, Normal affect [place].AT,PERRAL Supple Neck,No JVD, No cervical lymphadenopathy appriciated.  Symmetrical Chest wall movement, Good air movement bilaterally, CTAB RRR,No Gallops,Rubs or new Murmurs, No Parasternal Heave +ve B.Sounds, Abd Soft, Non tender, No organomegaly appriciated, No rebound -guarding or rigidity. No Cyanosis, Clubbing or edema, No new Rash or bruise  DISCHARGE CONDITION: Stable  DISPOSITION: SNF  DISCHARGE INSTRUCTIONS:    Activity:  As tolerated with Full fall precautions use walker/cane & assistance as needed  Get Medicines reviewed and adjusted: Please take all your medications with you for your next visit with your Primary MD  Please request your Primary MD to go over all hospital tests and procedure/radiological results at the follow up, please ask your Primary MD to get all Hospital records sent to his/her office.  If you experience worsening of your admission symptoms, develop shortness of breath, life threatening emergency, suicidal or homicidal thoughts you must seek medical attention immediately by calling 911 or calling your MD immediately  if symptoms less severe.  You must read complete instructions/literature along with all the possible adverse reactions/side effects for all the Medicines you take and that have been prescribed to you. Take any new  Medicines after you have completely understood and accpet all the possible adverse reactions/side effects.   Do not drive when taking Pain medications.   Do not take more than prescribed Pain, Sleep and Anxiety Medications  Special Instructions: If you have smoked or chewed Tobacco  in the last 2 yrs please stop smoking, stop any regular Alcohol  and or any Recreational drug use.  Wear Seat belts while driving.  Please note  You were cared for by a hospitalist during your hospital stay. Once you are discharged, your primary care physician will handle any further medical issues. Please note that NO REFILLS for any discharge medications will be authorized once you are discharged, as it is imperative that you return to your primary care physician (or establish a relationship with a primary care physician if you do not have one) for your aftercare needs so that they can reassess your need for medications and monitor your lab values.   Diet recommendation: Heart Healthy/renal diet-1200 mg fluid restriction  Discharge Instructions    Diet - low sodium heart healthy    Complete by:  As directed   Renal Diet     Increase activity slowly    Complete by:  As directed            Follow-up Information    Follow up with [name], MD. Schedule an appointment as soon as possible for a visit in 1 week.   Specialty:  Internal Medicine   Why:  Hospital follow up   Contact information:   [address] [place] [address] [phone]       Follow up with Sueanne Margarita, MD. Schedule an appointment as soon as possible for a visit in 1 week.   Specialty:  Cardiology   Why:  Hospital follow up   Contact information:   [address] [address] [phone]       Total Time spent on discharge equals 45 minutes.  SignedOren Binet 03/07/2015 8:49 AM

## 2015-03-07 NOTE — Progress Notes (Signed)
PT Cancellation Note  Patient Details Name: [name] MRN: [id number] DOB: 1940-08-05   Cancelled Treatment:    Reason Eval/Treat Not Completed: PT screened, no needs identified, will sign off (up with independent gait).  No further needs identified.   Ramond Dial 03/07/2015, 10:40 AM   Mee Hives, PT MS Acute Rehab Dept. Number: [facility] [id number] and [place] [phone]

## 2015-03-07 NOTE — Progress Notes (Addendum)
ANTICOAGULATION CONSULT NOTE - Follow Up Consult  Pharmacy Consult for warfarin Indication: atrial fibrillation  Allergies  Allergen Reactions  . Amiodarone Other (See Comments)    Severely reduced DLCO  . Oxycodone Hcl Rash and Other (See Comments)    Rash and itching    Patient Measurements: Height: 5\' 8"  (172.7 cm) Weight: 165 lb 3.2 oz (74.934 kg) IBW/kg (Calculated) : 68.4  Vital Signs: Temp: 97.8 F (36.6 C) (11/06 0835) Temp Source: Oral (11/06 0835) BP: 109/51 mmHg (11/06 0835) Pulse Rate: 72 (11/06 0835)  Labs:  Recent Labs  03/05/15 1726  03/05/15 2038 03/06/15 0129 03/06/15 0750 03/07/15 0213  HGB 10.8*  --   --  11.3*  --   --   HCT 34.0*  --   --  35.8*  --   --   PLT 247  --   --  232  --   --   LABPROT 31.4*  --   --  29.6*  --  33.3*  INR 3.11*  --   --  2.87*  --  3.35*  CREATININE 2.54*  --   --  3.36*  --   --   TROPONINI  --   < > 0.33* 0.39* 0.39*  --   < > = values in this interval not displayed.  Estimated Creatinine Clearance: 18.7 mL/min (by C-G formula based on Cr of 3.36).  Assessment: 74yo F admitted 03/05/2015 on chronic anticoagulation with warfarin for Afib. INR slightly elevated on admit 3.11. Also on Doxycycline for 5 days started 11/4.  INR 3.35, Hg 11.3 , plts wnl, No s/sx of bleeding noted.  PTA warfarin dose was 2mg  daily but reduced to 1.5mg  daily to start 11/4 (last pta dose 11/3)  Goal of Therapy:  INR 2-3 Monitor platelets by anticoagulation protocol: Yes   Plan:  - Warfarin 1 mg x1 tonight - Daily INR, CBC - Monitor s/sx bleeding  Dimitri Ped, PharmD. Clinical Pharmacist Resident Pager: [phone]  03/07/2015,10:22 AM

## 2015-03-08 ENCOUNTER — Ambulatory Visit: Payer: Self-pay

## 2015-03-08 DIAGNOSIS — I482 Chronic atrial fibrillation, unspecified: Secondary | ICD-10-CM | POA: Insufficient documentation

## 2015-03-09 ENCOUNTER — Telehealth: Payer: Self-pay | Admitting: Cardiology

## 2015-03-09 ENCOUNTER — Ambulatory Visit (INDEPENDENT_AMBULATORY_CARE_PROVIDER_SITE_OTHER): Payer: MEDICARE | Admitting: Thoracic Surgery (Cardiothoracic Vascular Surgery)

## 2015-03-09 ENCOUNTER — Encounter: Payer: Self-pay | Admitting: Thoracic Surgery (Cardiothoracic Vascular Surgery)

## 2015-03-09 VITALS — BP 110/58 | HR 57 | Resp 16 | Ht 68.0 in | Wt 169.0 lb

## 2015-03-09 DIAGNOSIS — Z954 Presence of other heart-valve replacement: Secondary | ICD-10-CM

## 2015-03-09 DIAGNOSIS — Z952 Presence of prosthetic heart valve: Secondary | ICD-10-CM

## 2015-03-09 NOTE — Progress Notes (Signed)
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  The patient's inpatient postoperative recovery was slow and notable for problems with management of chronic diastolic congestive heart failure and recurrent pleural effusions. Management of fluid overload using hemodialysis was challenging because of the patient's underlying chronic persistent atrial fibrillation, severe diastolic dysfunction, and mitral regurgitation.  He also experienced significant postoperative confusion and delirium that gradually improved over time. The patient ultimately was discharged to a local skilled nursing facility on the 13th postoperative day.  Although he initially did well, he was readmitted to the hospital last week for 2 days because of rapid atrial fibrillation. His medications were adjusted and he was discharged from the hospital again without complications. Follow-up transthoracic echocardiogram performed during his hospitalization on 03/06/2015 revealed normal functioning transcatheter heart valve in the aortic position with stable mild paravalvular leak. Left ventricular systolic function remained normal with ejection fraction estimated 55-60%. There was mild mitral regurgitation.   The patient returns to our office today accompanied by his wife for routine follow-up. He states that he  is gradually improving. He still gets short of breath with mild activity, but overall his exercise to tolerance has improved. He denies any resting shortness of breath. He no longer has any pain in his chest. Appetite is fairly good. He has been participating with physical therapy at his rehabilitation facility and making some progress. He is hopeful to be discharged home within a week or so.  Current Outpatient Prescriptions  Medication Sig Dispense Refill  . acetaminophen (TYLENOL) 500 MG tablet Take 1,000 mg by mouth every 6 (six) hours as needed for mild pain or moderate pain.    [name] aspirin 81 MG tablet Take 81 mg by mouth daily.    . B Complex-C-Folic Acid (RENAL VITAMIN PO) Take 1 tablet by mouth daily.    [name] diltiazem (CARDIZEM CD) 360 MG 24 hr capsule Take 1 capsule (360 mg total) by mouth at bedtime.    [name] doxycycline (VIBRA-TABS) 100 MG tablet Take 1 tablet (100 mg total) by mouth every 12 (twelve) hours. For 3 more days from 03/07/15    . ferric gluconate 125 mg in sodium chloride 0.9 % 100 mL Inject 125 mg into the vein every Monday, Wednesday, and Friday with hemodialysis.    . Hypromellose (ARTIFICIAL TEARS OP) Place 1 drop into both eyes daily as needed (rinse eyes).     . Loperamide HCl (IMODIUM PO) Take 1 tablet by mouth 2 (two) times daily.    . metoprolol tartrate (LOPRESSOR) 25 MG tablet Take 0.5 tablets (12.5 mg total) by mouth 2 (two) times daily. 60 tablet 1  . Multiple Vitamins-Minerals (MULTIVITAMIN WITH MINERALS) tablet Take 1 tablet by mouth daily.    . Pancrelipase, Lip-Prot-Amyl, (CREON) 24000 UNITS CPEP Take 48,000 Units by mouth 2 (two) times daily with a meal.     . sevelamer  carbonate (RENVELA) 800 MG tablet 1,600 mg 3 (three) times daily with meals.     . warfarin (COUMADIN) 3 MG tablet Take 0.5 tablets (1.5 mg total) by mouth daily at 6 PM. start 03-08-15     No current facility-administered medications for this visit.      Physical Exam:   BP 110/58 mmHg  Pulse  57  Resp 16  Ht 5\' 8"  (1.727 m)  Wt 169 lb (76.658 kg)  BMI 25.70 kg/m2  SpO2 95%  General:  Elderly and chronically ill but overall well-appearing  Chest:   Clear to auscultation  CV:   Irregular rate and rhythm  Incisions:  Healing nicely. Left chest tube incision is open and requires some cleaning due to accumulation of purulent debris  Abdomen:  Soft and nontender  Extremities:  Warm and well-perfused  Diagnostic Tests:  Transthoracic Echocardiography  Patient:  [name], [name] MR #:    [id number] Study Date: 03/06/2015 Gender:   M Age:    56 Height:   172.7 cm Weight:   73.5 kg BSA:    1.89 m^2 Pt. Status: Room:    3W18C  ATTENDING  Lorin Picket [id number] Veto Kemps [id number] SONOGRAPHER Johny Chess, RDCS, CCT PERFORMING  [facility], Inpatient  cc:  ------------------------------------------------------------------- LV EF: 55% -  60%  ------------------------------------------------------------------- Indications:   Atrial fibrillation - 427.31.  ------------------------------------------------------------------- History:  PMH:  Congestive heart failure. Risk factors: Hyper-obstructive Cardiomyopathy. TAVR. Elevated troponin. Hypertension.  ------------------------------------------------------------------- Study Conclusions  - Left ventricle: The cavity size was normal. Wall thickness was increased in a pattern of moderate LVH. Systolic function was normal. The estimated ejection fraction was in the range of 55% to 60%. - Aortic valve: Post 29 mm Sapein TAVR valve Stable mild perivalvular regurgitation. Valve area (VTI): 2.66 cm^2. Valve area (Vmax): 2.7 cm^2. Valve area (Vmean): 2.61 cm^2. - Mitral valve: Calcified annulus. There was mild regurgitation. Valve area by continuity equation (using LVOT flow): 1.63 cm^2. - Left atrium: The atrium was severely dilated. -  Right ventricle: The cavity size was moderately dilated. - Right atrium: The atrium was moderately dilated. - Tricuspid valve: There was moderate regurgitation. - Pulmonary arteries: PA peak pressure: 38 mm Hg (S). - Pericardium, extracardiac: There was a left pleural effusion.  Transthoracic echocardiography. M-mode, complete 2D, spectral Doppler, and color Doppler. Birthdate: Patient birthdate: 11/29/1940. Age: Patient is 74 yr old. Sex: Gender: male. BMI: 24.6 kg/m^2. Blood pressure:   103/54 Patient status: Inpatient. Study date: Study date: 03/06/2015. Study time: 04:43 PM. Location: Bedside.  -------------------------------------------------------------------  ------------------------------------------------------------------- Left ventricle: The cavity size was normal. Wall thickness was increased in a pattern of moderate LVH. Systolic function was normal. The estimated ejection fraction was in the range of 55% to 60%.  ------------------------------------------------------------------- Aortic valve: Post 29 mm Sapein TAVR valve Stable mild perivalvular regurgitation. Doppler:   VTI ratio of LVOT to aortic valve: 0.7. Valve area (VTI): 2.66 cm^2. Indexed valve area (VTI): 1.41 cm^2/m^2. Peak velocity ratio of LVOT to aortic valve: 0.71. Valve area (Vmax): 2.7 cm^2. Indexed valve area (Vmax): 1.43 cm^2/m^2. Mean velocity ratio of LVOT to aortic valve: 0.69. Valve area (Vmean): 2.61 cm^2. Indexed valve area (Vmean): 1.39 cm^2/m^2.  Mean gradient (S): 5 mm Hg. Peak gradient (S): 10 mm Hg.  ------------------------------------------------------------------- Aorta: The aorta was normal, not dilated, and non-diseased.  ------------------------------------------------------------------- Mitral valve:  Calcified annulus. Doppler: There was mild regurgitation.  Valve area by continuity equation (using  LVOT flow): 1.63 cm^2. Indexed valve area by  continuity equation (using LVOT flow): 0.86 cm^2/m^2.  Mean gradient (D): 7 mm Hg.  ------------------------------------------------------------------- Left atrium: The atrium was severely dilated.  ------------------------------------------------------------------- Right ventricle: The cavity size was moderately dilated.  ------------------------------------------------------------------- Pulmonic valve:  Doppler: There was mild regurgitation.  ------------------------------------------------------------------- Tricuspid valve:  Doppler: There was moderate regurgitation.  ------------------------------------------------------------------- Right atrium: The atrium was moderately dilated.  ------------------------------------------------------------------- Pericardium: The pericardium was normal in appearance.  ------------------------------------------------------------------- Pleura: There was a left pleural effusion.  ------------------------------------------------------------------- Post procedure conclusions Ascending Aorta:  - The aorta was normal, not dilated, and non-diseased.  ------------------------------------------------------------------- Measurements  Left ventricle              Value     Reference LV ID, ED, PLAX chordal          50.9 mm    43 - 52 LV ID, ES, PLAX chordal          34  mm    23 - 38 LV fx shortening, PLAX chordal      33  %    >=29 LV PW thickness, ED            12  mm    --------- IVS/LV PW ratio, ED            1.03      <=1.3 Stroke volume, 2D             83  ml    --------- Stroke volume/bsa, 2D           44  ml/m^2  ---------  Ventricular septum            Value     Reference IVS thickness, ED             12.4 mm    ---------  LVOT                    Value     Reference LVOT ID, S                22  mm    --------- LVOT area                 3.8  cm^2   --------- LVOT peak velocity, S           111  cm/s   --------- LVOT mean velocity, S           67.8 cm/s   --------- LVOT VTI, S                21.9 cm    ---------  Aortic valve               Value     Reference Aortic valve peak velocity, S       156  cm/s   --------- Aortic valve mean velocity, S       98.6 cm/s   --------- Aortic valve VTI, S            31.3 cm    --------- Aortic mean gradient, S          5   mm Hg  --------- Aortic peak gradient, S          10  mm Hg  --------- VTI ratio, LVOT/AV            0.7      --------- Aortic valve area, VTI  2.66 cm^2   --------- Aortic valve area/bsa, VTI        1.41 cm^2/m^2 --------- Velocity ratio, peak, LVOT/AV       0.71      --------- Aortic valve area, peak velocity     2.7  cm^2   --------- Aortic valve area/bsa, peak        1.43 cm^2/m^2 --------- velocity Velocity ratio, mean, LVOT/AV       0.69      --------- Aortic valve area, mean velocity     2.61 cm^2   --------- Aortic valve area/bsa, mean        1.39 cm^2/m^2 --------- velocity  Aorta                   Value     Reference Aortic root ID, ED            31  mm    ---------  Left atrium                Value     Reference LA ID, A-P, ES              50  mm    --------- LA ID/bsa, A-P          (H)   2.65 cm/m^2  <=2.2 LA volume, S               145  ml    --------- LA volume/bsa, S             76.9 ml/m^2  --------- LA volume, ES, 1-p A4C           136  ml    --------- LA volume/bsa, ES, 1-p A4C        72.1 ml/m^2  --------- LA volume, ES, 1-p A2C          138  ml    --------- LA volume/bsa, ES, 1-p A2C        73.2 ml/m^2  ---------  Mitral valve               Value     Reference Mitral mean velocity, D          107  cm/s   --------- Mitral mean gradient, D          7   mm Hg  --------- Mitral valve area, LVOT          1.63 cm^2   --------- continuity Mitral valve area/bsa, LVOT        0.86 cm^2/m^2 --------- continuity Mitral annulus VTI, D           51.2 cm    ---------  Pulmonary arteries            Value     Reference PA pressure, S, DP        (H)   38  mm Hg  <=30  Tricuspid valve              Value     Reference Tricuspid regurg peak velocity      297  cm/s   --------- Tricuspid peak RV-RA gradient       35  mm Hg  ---------  Systemic veins              Value     Reference Estimated CVP               3   mm Hg  ---------  Right ventricle              Value     Reference RV pressure, S, DP        (H)   38  mm Hg  <=30  Legend: (L) and (H) mark values outside specified reference range.  ------------------------------------------------------------------- Prepared and Electronically Authenticated by  Jenkins Rouge, M.D. 2016-11-06T12:27:03   Impression:  Patient is doing reasonably well nearly 3 weeks status post transcatheter aortic valve replacement via a transapical approach for severe symptomatic aortic stenosis in the setting of long-standing chronic diastolic congestive heart failure, chronic persistent atrial fibrillation, hypertension, and dialysis-dependent renal failure.  I have personally reviewed the follow-up  echocardiogram performed last week and left ventricular systolic function remains preserved and there is mild paravalvular leak, similar to what was appreciated at the time of surgery.  This was not unexpected given the amount of severe calcification in the aortic annulus extending down into the inter-valvular fibrosis and the mitral valve. The patient is clinically improving. He describes stable symptoms of exertional shortness of breath consistent with chronic diastolic congestive heart failure, [place] Heart Association functional class III. It sounds as though he has been doing better with dialysis recently. His strength is slowly improving.  Plan:  I have encouraged the patient to continue to gradually increase his physical activity as tolerated. I would expect that he should be able to be discharged home within the next week or 2. He may need continued home health physical therapy for a period of time and eventually to be transitioned into the outpatient cardiac rehabilitation program. We have not recommended any changes to the patient's current medications at this time. All of his questions been addressed. The patient will return in 4 weeks for routine follow-up.  I spent in excess of 15 minutes during the conduct of this office consultation and >50% of this time involved direct face-to-face encounter with the patient for counseling and/or coordination of their care.   Valentina Gu. Roxy Manns, MD 03/09/2015 5:57 PM

## 2015-03-09 NOTE — Telephone Encounter (Signed)
New Message   Pt states that he wants an appt with Turner offered first available pt   Rather speak to rn

## 2015-03-09 NOTE — Patient Instructions (Signed)
You may continue to gradually increase your physical activity as tolerated.  Refrain from any heavy lifting or strenuous use of your arms and shoulders until at least 8 weeks from the time of your surgery, and avoid activities that cause increased pain in your chest on the side of your surgical incision.  Otherwise you may continue to increase activities without any particular limitations.  Increase the intensity and duration of physical activity gradually.  Continue all previous medications without any changes at this time  Cleanse chest tube incision on left chest every day and keep covered with a Band-aid.

## 2015-03-09 NOTE — Telephone Encounter (Signed)
Confirmed with patient that he already has appointment scheduled with Richardson Dopp 12/6. Patient grateful for callback.

## 2015-03-10 ENCOUNTER — Telehealth: Payer: Self-pay | Admitting: Cardiology

## 2015-03-10 LAB — PROTIME-INR: INR: 2.2 — AB (ref 0.9–1.1)

## 2015-03-10 NOTE — Telephone Encounter (Signed)
Refer to afib clinic 

## 2015-03-10 NOTE — Telephone Encounter (Signed)
New  Message  NP with [facility] called to give the message that Dr. Jimmy Footman Lowered the previous dose of diltiazem (CARDIZEM CD) 360 MG 24 hr capsule. She reports that He cut it to 120 mg because of hypotension of dialysis. Can the patient be changed to something else so that can manage his Afib. Please call back to discuss

## 2015-03-11 ENCOUNTER — Ambulatory Visit: Payer: MEDICARE | Admitting: Cardiology

## 2015-03-11 NOTE — Telephone Encounter (Signed)
OV scheduled with Roderic Palau in the [facility] 11/15 at 1130. Reviewed directions with patient and he has no questions.

## 2015-03-12 ENCOUNTER — Non-Acute Institutional Stay (SKILLED_NURSING_FACILITY): Payer: MEDICARE | Admitting: Nurse Practitioner

## 2015-03-12 ENCOUNTER — Ambulatory Visit (INDEPENDENT_AMBULATORY_CARE_PROVIDER_SITE_OTHER): Payer: MEDICARE | Admitting: Cardiovascular Disease

## 2015-03-12 DIAGNOSIS — I482 Chronic atrial fibrillation, unspecified: Secondary | ICD-10-CM

## 2015-03-12 DIAGNOSIS — Z992 Dependence on renal dialysis: Secondary | ICD-10-CM

## 2015-03-12 DIAGNOSIS — N186 End stage renal disease: Secondary | ICD-10-CM | POA: Diagnosis not present

## 2015-03-12 DIAGNOSIS — I5032 Chronic diastolic (congestive) heart failure: Secondary | ICD-10-CM

## 2015-03-12 DIAGNOSIS — Z954 Presence of other heart-valve replacement: Secondary | ICD-10-CM

## 2015-03-12 DIAGNOSIS — Z7901 Long term (current) use of anticoagulants: Secondary | ICD-10-CM

## 2015-03-12 DIAGNOSIS — Z952 Presence of prosthetic heart valve: Secondary | ICD-10-CM

## 2015-03-12 NOTE — Progress Notes (Signed)
Patient ID: ALBY BURRUEL, male   DOB: [birth year], 74 y.o.   MRN: [id number]    Nursing Home Location:  [place] of Service: SNF (31)  PCP: [name], MD  Allergies  Allergen Reactions  . Amiodarone Other (See Comments)    Severely reduced DLCO  . Oxycodone Hcl Rash and Other (See Comments)    Rash and itching    Chief Complaint  Patient presents with  . Discharge Note    HPI:  Patient is a 74 y.o. male seen today at [facility] for discharge home. Pt with a hx of  hypertension, atrial fibrillation on Coumadin, ESRD-HD (M WF), , PVD, CAD, chronic diastolic congestive heart failure, bladder cancer (S A/P bladder resection and surgically created pouch from colon) who self caths. Pt at [facility] after recent TAVR on 02/16/15 with paroxysmal V. tach, who was readmitted to hospital due to presented with Afib RVR. Rate with improved control on current regimen.  Patient currently doing well with therapy, now stable to discharge home with home health.  Review of Systems:  Review of Systems  Constitutional: Negative for activity change, appetite change, fatigue and unexpected weight change.  HENT: Negative for congestion and hearing loss.   Eyes: Negative.   Respiratory: Negative for cough and shortness of breath.   Cardiovascular: Negative for chest pain, palpitations and leg swelling.  Gastrointestinal: Negative for abdominal pain, diarrhea and constipation.  Genitourinary:       Dialysis and s/p bladder resection self caths daily   Musculoskeletal: Negative for myalgias and arthralgias.  Skin: Negative for color change and wound.  Neurological: Negative for dizziness and weakness.  Psychiatric/Behavioral: Negative for behavioral problems, confusion and agitation.    Past Medical History  Diagnosis Date  . Atrial fibrillation, persistent ([place])     a. cardioversion 11/21/10 . b. 05/2013 - experiencing more SOB. 24-hr holter  showed avg HR 49, max 82, min 38bpm. ETT showed baseline junctional bradycardia, HR incr only to 90bpm with drop in BP. Saw EP - amiodarone reduced and eventually d/c'd with improvement in dyspnea.  . Hypertension   . Warfarin anticoagulation   . Aortic valve stenosis   . Orthostatic hypotension   . Diastolic dysfunction   . Arthritis   . Self-catheterizes urinary bladder   . Junctional bradycardia   . NSVT (nonsustained ventricular tachycardia) ([place])     a. By holter 01/2014.  [name] Shortness of breath dyspnea   . PVD (peripheral vascular disease) ([place])   . Cardiomyopathy ([place])     non-ischemic  . Hypomagnesemia   . A-V fistula ([place])   . CAD (coronary artery disease) 12/16/14    a. cath - nonobstrucive CAD 30% pro RCA; 25% dis RCA; 40T pro Lcx  . Heart murmur   . Dysrhythmia   . CHF (congestive heart failure) ([place])   . Full dentures   . Uses hearing aid   . Chronic diarrhea     takes imodium BID  . Pneumonia     hx of  . Hemodialysis patient [facility])     M,W,F - dialysis initiated August 2016  . Bruises easily   . Bladder cancer ([place])     a. 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  . 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 CKD (chronic kidney disease), stage V ([place])  a. Followed by Dr. Jimmy Footman. b. IV fistula placed 04/17/14. c. initiation of HD August 2016  . RTA (renal tubular acidosis)   . ESRD (end stage renal disease) on dialysis [facility])     "MWF; [name]" (03/05/2015)   Past Surgical History  Procedure Laterality Date  . Cardioversion  11/21/2010  . Cystectomy    . Cholecystectomy    . Cataract extraction w/ intraocular lens  implant, bilateral    . Colonoscopy    . Av fistula placement Left 04/17/2014    Procedure: ARTERIOVENOUS (AV) FISTULA CREATION- LEFT UPPER ARM ;  Surgeon: Mal [name], MD;  Location: [place];  Service: Vascular;   Laterality: Left;  . Bladder surgery      radical cystectomy  . Vascular surgery    . Multiple tooth extractions    . Cardiac catheterization N/A 12/16/2014    Procedure: Right/Left Heart Cath and Coronary Angiography;  Surgeon: [name], MD;  Location: [place] CV LAB;  Service: Cardiovascular;  Laterality: N/A;  . Eye surgery    . Colon surgery  Nov 2002    reconstructed bladder from colon. per pt. at [facility]  . Tee without cardioversion N/A 01/01/2015    Procedure: TRANSESOPHAGEAL ECHOCARDIOGRAM (TEE);  Surgeon: [name], MD;  Location: [facility] ENDOSCOPY;  Service: Cardiovascular;  Laterality: N/A;  . Transcatheter aortic valve replacement, transapical N/A 02/16/2015    Procedure: TRANSCATHETER AORTIC VALVE REPLACEMENT, TRANSAPICAL;  Surgeon: [name], MD;  Location: [place];  Service: Open Heart Surgery;  Laterality: N/A;  . Tee without cardioversion N/A 02/16/2015    Procedure: TRANSESOPHAGEAL ECHOCARDIOGRAM (TEE);  Surgeon: [name], MD;  Location: [place];  Service: Open Heart Surgery;  Laterality: N/A;   Social History:   reports that he quit smoking about 31 years ago. His smoking use included Cigarettes. He has a 66 pack-year smoking history. He has never used smokeless tobacco. He reports that he drinks alcohol. He reports that he does not use illicit drugs.  Family History  Problem Relation Age of Onset  . CVA Mother   . Heart attack Father   . Heart disease Father   . Arrhythmia Father   . Alzheimer's disease Sister   . Multiple sclerosis Brother   . Alcohol abuse Father     Medications: Patient's Medications  New Prescriptions   No medications on file  Previous Medications   ACETAMINOPHEN (TYLENOL) 500 MG TABLET    Take 1,000 mg by mouth every 6 (six) hours as needed for mild pain or moderate pain.   ASPIRIN 81 MG TABLET    Take 81 mg by mouth daily.   B COMPLEX-C-FOLIC ACID (RENAL VITAMIN PO)    Take 1 tablet by mouth daily.   DILTIAZEM (CARDIZEM  CD) 120 MG 24 HR CAPSULE    Take 120 mg by mouth daily.   FERRIC GLUCONATE 125 MG IN SODIUM CHLORIDE 0.9 % 100 ML    Inject 125 mg into the vein every Monday, Wednesday, and Friday with hemodialysis.   HYPROMELLOSE (ARTIFICIAL TEARS OP)    Place 1 drop into both eyes daily as needed (rinse eyes).    LOPERAMIDE HCL (IMODIUM PO)    Take 1 tablet by mouth 2 (two) times daily.   METOPROLOL TARTRATE (LOPRESSOR) 25 MG TABLET    Take 0.5 tablets (12.5 mg total) by mouth 2 (two) times daily.   MULTIPLE VITAMINS-MINERALS (MULTIVITAMIN WITH MINERALS) TABLET    Take 1 tablet by mouth daily.  PANCRELIPASE, LIP-PROT-AMYL, (CREON) 24000 UNITS CPEP    Take 48,000 Units by mouth 2 (two) times daily with a meal.    SEVELAMER CARBONATE (RENVELA) 800 MG TABLET    1,600 mg 3 (three) times daily with meals.    WARFARIN (COUMADIN) 2 MG TABLET    Take 2 mg by mouth daily.  Modified Medications   No medications on file  Discontinued Medications   DILTIAZEM (CARDIZEM CD) 360 MG 24 HR CAPSULE    Take 1 capsule (360 mg total) by mouth at bedtime.   DOXYCYCLINE (VIBRA-TABS) 100 MG TABLET    Take 1 tablet (100 mg total) by mouth every 12 (twelve) hours. For 3 more days from 03/07/15   WARFARIN (COUMADIN) 3 MG TABLET    Take 0.5 tablets (1.5 mg total) by mouth daily at 6 PM. start 03-08-15     Physical Exam: Filed Vitals:   03/12/15 1208  BP: 142/77  Pulse: 80  Temp: 97 F (36.1 C)  Resp: 20    Physical Exam  Constitutional: He is oriented to person, place, and time. He appears well-developed and well-nourished. No distress.  HENT:  Head: Normocephalic and atraumatic.  Mouth/Throat: Oropharynx is clear and moist. No oropharyngeal exudate.  Eyes: Conjunctivae and EOM are normal. Pupils are equal, round, and reactive to light.  Neck: Normal range of motion. Neck supple.  Cardiovascular: Normal rate and normal heart sounds.  An irregular rhythm present.  Pulmonary/Chest: Effort normal and breath sounds normal.    Abdominal: Soft. Bowel sounds are normal. He exhibits no distension. There is no tenderness.  Musculoskeletal: He exhibits no edema or tenderness.  Neurological: He is alert and oriented to person, place, and time.  Skin: Skin is warm and dry. He is not diaphoretic.  Psychiatric: He has a normal mood and affect.    Labs reviewed: Basic Metabolic Panel:  Recent Labs  02/20/15 0402  02/21/15 0403  02/26/15 0821 02/27/15 0249 03/01/15 0723 03/05/15 1726 03/05/15 1849 03/06/15 0129  NA 131*  < > 132*  < > 130* 137 135 138  --  136  K 4.2  < > 4.7  < > 4.1 4.1 4.1 3.6  --  4.4  CL 98*  < > 99*  < > 94* 96* 97* 94*  --  97*  CO2 28  < > 28  < > 24 27 25 29   --  28  GLUCOSE 122*  < > 101*  < > 139* 95 103* 108*  --  114*  BUN 17  < > 16  < > 50* 29* 65* 16  --  23*  CREATININE 2.37*  < > 2.15*  < > 4.91* 3.57* 5.41* 2.54*  --  3.36*  CALCIUM 8.1*  < > 8.3*  < > 9.0 9.0 9.0 8.4*  --  8.7*  MG 2.3  --  2.4  --   --   --   --   --  2.1  --   PHOS 2.3*  < > 2.0*  < > 4.8* 4.2 6.6*  --   --   --   < > = values in this interval not displayed. Liver Function Tests:  Recent Labs  02/11/15 0956  02/20/15 0402  02/27/15 0249 03/01/15 0723 03/06/15 0129  AST 33  --  133*  --   --   --  97*  ALT 25  --  245*  --   --   --  69*  ALKPHOS 62  --  60  --   --   --  73  BILITOT 0.9  --  0.8  --   --   --  1.4*  PROT 6.5  --  5.0*  --   --   --  6.0*  ALBUMIN 3.5  < > 2.6*  < > 3.0* 2.8* 3.2*  < > = values in this interval not displayed.  Recent Labs  03/05/15 2038  LIPASE 98*   No results for input(s): AMMONIA in the last 8760 hours. CBC:  Recent Labs  12/10/14 1827 12/11/14 0320  02/26/15 0821  03/01/15 0723 03/05/15 1726 03/06/15 0129  WBC 8.3 8.0  < > 12.0*  < > 13.4* 9.5 9.1  NEUTROABS 5.7 5.0  --  8.6*  --   --   --   --   HGB 12.7* 11.6*  < > 9.7*  < > 9.9* 10.8* 11.3*  HCT 38.8* 35.6*  < > 29.0*  < > 30.8* 34.0* 35.8*  MCV 108.1* 106.0*  < > 106.6*  < > 106.9*  109.3* 110.5*  PLT 230 213  < > 190  < > 254 247 232  < > = values in this interval not displayed. TSH:  Recent Labs  04/28/14 1700 03/06/15 0129  TSH 2.46 2.583   A1C: Lab Results  Component Value Date   HGBA1C 5.9* 02/11/2015   Lipid Panel: No results for input(s): CHOL, HDL, LDLCALC, TRIG, CHOLHDL, LDLDIRECT in the last 8760 hours.   Assessment/Plan 1. S/P TAVR (transcatheter aortic valve replacement) Stable, ongoing followup with cardiothoracic surgeon. Has done well with therapy.   2. ESRD (end stage renal disease) on dialysis ([place]) conts dialysis M, W, F  3. Chronic diastolic CHF, class 3 Evolemic, managed by HD, conts betablocker  4. Chronic atrial fibrillation (HCC) Rate controlled, rate controlled, remains on cardizem and coumadin   5. Chronic anticoagulation Currently on 2 mg coumadin, will need INR followed up early next week~11/14, 11/15  pt is stable for discharge-will need PT/OT/Nursing per home health. No DME needed. Rx written.  will need to follow up with PCP within 2 weeks.     Carlos American. Harle Battiest  [facility] [phone] 8 am - 5 pm) [phone] (after hours)

## 2015-03-16 ENCOUNTER — Ambulatory Visit (HOSPITAL_COMMUNITY)
Admission: RE | Admit: 2015-03-16 | Discharge: 2015-03-16 | Disposition: A | Discharge status: Home / Self Care | Payer: MEDICARE | Source: Ambulatory Visit | Attending: Nurse Practitioner | Admitting: Nurse Practitioner

## 2015-03-16 VITALS — BP 96/62 | HR 121 | Ht 68.0 in | Wt 166.8 lb

## 2015-03-16 DIAGNOSIS — I959 Hypotension, unspecified: Secondary | ICD-10-CM | POA: Diagnosis not present

## 2015-03-16 DIAGNOSIS — I482 Chronic atrial fibrillation: Secondary | ICD-10-CM | POA: Insufficient documentation

## 2015-03-16 DIAGNOSIS — I4821 Permanent atrial fibrillation: Secondary | ICD-10-CM

## 2015-03-17 ENCOUNTER — Encounter (HOSPITAL_COMMUNITY): Payer: Self-pay | Admitting: Nurse Practitioner

## 2015-03-17 NOTE — Progress Notes (Signed)
Patient ID: [name], male   DOB: [birth year], 74 y.o.   MRN: [id number]      Primary Care Physician: [name], MD Referring Physician: Dr. Ashok Norris  [name] is a 74 y.o. male with a h/o aortic stenosis, S/P TAVR 10/28,  hypertension, HOCM, stage V chronic kidney disease on hemodialysis, chronic persistent atrial fibrillation with history of thrombus of the left atrial appendage on warfarin anticoagulation, chronic diastolic congestive heart failure, and orthostatic hypotension, in the  afib clinic, referred by Dr. Radford Pax, for consideration on how to better manage  afib with RVR, secondary  to  decrease of cardizem by nephrology, to manage hypotension post dialysis.   Pt has done well after TAVR and saw Dr. Roxy Manns 11/8, with afib controlled   at v rate of 57 and a BP of 110/58 on cardizem 360 mg a day, plus metoprolol 25 mg bid.  Nephrology then reduced cardizem due to hypotension after dialysis to 120 mg qd. Some time after that, pt was admitted with RVR, cardizem again increased to 360 mg, and when he went back to dialysis, he again  had issues with post dialysis hypotension and cardizem was again decreased. Dr. Jimmy Footman then asked Dr. Radford Pax for other suggestions to control his afib and he was referred to the afib clinic.  Today in the office, his ekg shows afib with rvr 121 bpm. He appears to be tolerating heart rate today. BP soft at 95/62. He currently is on cardizem 120 mg qd/ metoprolol 25 mg bid. He has a history of using amiodarone but was pulled off of it by pulmonogy due to severely reduced DLCO in 2015.There currently aren't any other antiarrythmic's that pt would be eligible to use due to renal function.   Today, he denies symptoms of palpitations, chest pain, shortness of breath, orthopnea, PND,   dizziness, presyncope, syncope, or neurologic sequela. Postiive for lower extremity edema.    Past Medical History  Diagnosis Date  . Atrial fibrillation, persistent  ([place])     a. cardioversion 11/21/10 . b. 05/2013 - experiencing more SOB. 24-hr holter showed avg HR 49, max 82, min 38bpm. ETT showed baseline junctional bradycardia, HR incr only to 90bpm with drop in BP. Saw EP - amiodarone reduced and eventually d/c'd with improvement in dyspnea.  . Hypertension   . Warfarin anticoagulation   . Aortic valve stenosis   . Orthostatic hypotension   . Diastolic dysfunction   . Arthritis   . Self-catheterizes urinary bladder   . Junctional bradycardia   . NSVT (nonsustained ventricular tachycardia) ([place])     a. By holter 01/2014.  [name] Shortness of breath dyspnea   . PVD (peripheral vascular disease) ([place])   . Cardiomyopathy ([place])     non-ischemic  . Hypomagnesemia   . A-V fistula ([place])   . CAD (coronary artery disease) 12/16/14    a. cath - nonobstrucive CAD 30% pro RCA; 25% dis RCA; 40T pro Lcx  . Heart murmur   . Dysrhythmia   . CHF (congestive heart failure) ([place])   . Full dentures   . Uses hearing aid   . Chronic diarrhea     takes imodium BID  . Pneumonia     hx of  . Hemodialysis patient [facility])     M,W,F - dialysis initiated August 2016  . Bruises easily   . Bladder cancer ([place])     a. 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  . S/P TAVR (  transcatheter aortic valve replacement) 02/16/2015    29 mm Edwards Sapien XT transcatheter heart valve placed via transapical approach  . CKD (chronic kidney disease), stage V ([place])     a. Followed by Dr. Jimmy Footman. b. IV fistula placed 04/17/14. c. initiation of HD August 2016  . RTA (renal tubular acidosis)   . ESRD (end stage renal disease) on dialysis [facility])     "MWF; [name]" (03/05/2015)   Past Surgical History  Procedure Laterality Date  . Cardioversion  11/21/2010  . Cystectomy    . Cholecystectomy    . Cataract extraction w/ intraocular lens  implant, bilateral    . Colonoscopy    . Av fistula placement Left 04/17/2014    Procedure: ARTERIOVENOUS (AV) FISTULA CREATION-  LEFT UPPER ARM ;  Surgeon: Mal [name], MD;  Location: [place];  Service: Vascular;  Laterality: Left;  . Bladder surgery      radical cystectomy  . Vascular surgery    . Multiple tooth extractions    . Cardiac catheterization N/A 12/16/2014    Procedure: Right/Left Heart Cath and Coronary Angiography;  Surgeon: [name], MD;  Location: [place] CV LAB;  Service: Cardiovascular;  Laterality: N/A;  . Eye surgery    . Colon surgery  Nov 2002    reconstructed bladder from colon. [facility] at [facility]  . Tee without cardioversion N/A 01/01/2015    Procedure: TRANSESOPHAGEAL ECHOCARDIOGRAM (TEE);  Surgeon: [name], MD;  Location: [facility] ENDOSCOPY;  Service: Cardiovascular;  Laterality: N/A;  . Transcatheter aortic valve replacement, transapical N/A 02/16/2015    Procedure: TRANSCATHETER AORTIC VALVE REPLACEMENT, TRANSAPICAL;  Surgeon: [name], MD;  Location: [place];  Service: Open Heart Surgery;  Laterality: N/A;  . Tee without cardioversion N/A 02/16/2015    Procedure: TRANSESOPHAGEAL ECHOCARDIOGRAM (TEE);  Surgeon: [name], MD;  Location: [place];  Service: Open Heart Surgery;  Laterality: N/A;    Current Outpatient Prescriptions  Medication Sig Dispense Refill  . acetaminophen (TYLENOL) 500 MG tablet Take 1,000 mg by mouth every 6 (six) hours as needed for mild pain or moderate pain.    [name] aspirin 81 MG tablet Take 81 mg by mouth daily.    . ferric gluconate 125 mg in sodium chloride 0.9 % 100 mL Inject 125 mg into the vein every Monday, Wednesday, and Friday with hemodialysis.    . Loperamide HCl (IMODIUM PO) Take 1 tablet by mouth 2 (two) times daily.    . metoprolol tartrate (LOPRESSOR) 25 MG tablet Take 0.5 tablets (12.5 mg total) by mouth 2 (two) times daily. 60 tablet 1  . Multiple Vitamins-Minerals (MULTIVITAMIN WITH MINERALS) tablet Take 1 tablet by mouth daily.    . Pancrelipase, Lip-Prot-Amyl, (CREON) 24000 UNITS CPEP Take 48,000 Units by mouth 3 (three) times daily  with meals.     . sevelamer carbonate (RENVELA) 800 MG tablet 1,600 mg 3 (three) times daily with meals.     . B Complex-C-Folic Acid (RENAL VITAMIN PO) Take 1 tablet by mouth daily.    [name] diltiazem (CARDIZEM CD) 120 MG 24 hr capsule Take 120 mg by mouth daily.    [name] warfarin (COUMADIN) 2 MG tablet Take 2 mg by mouth daily.     No current facility-administered medications for this encounter.    Allergies  Allergen Reactions  . Amiodarone Other (See Comments)    Severely reduced DLCO  . Oxycodone Hcl Rash and Other (See Comments)    Rash and itching  Social History   Social History  . Marital Status: Married    Spouse Name: N/A  . Number of Children: N/A  . Years of Education: N/A   Occupational History  . Not on file.   Social History Main Topics  . Smoking status: Former Smoker -- 2.00 packs/day for 33 years    Types: Cigarettes    Quit date: 05/02/1983  . Smokeless tobacco: Never Used  . Alcohol Use: Yes     Comment: rare  . Drug Use: No  . Sexual Activity: Not on file   Other Topics Concern  . Not on file   Social History Narrative    Family History  Problem Relation Age of Onset  . CVA Mother   . Heart attack Father   . Heart disease Father   . Arrhythmia Father   . Alzheimer's disease Sister   . Multiple sclerosis Brother   . Alcohol abuse Father     ROS- All systems are reviewed and negative except as per the HPI above  Physical Exam: Filed Vitals:   03/16/15 1140  BP: 96/62  Pulse: 121  Height: 5\' 8"  (1.727 m)  Weight: 166 lb 12.8 oz (75.66 kg)    GEN- The patient is well appearing, alert and oriented x 3 today.   Head- normocephalic, atraumatic Eyes-  Sclera clear, conjunctiva pink Ears- hearing intact Oropharynx- clear Neck- supple, no JVP Lymph- no cervical lymphadenopathy Lungs- Clear to ausculation bilaterally, normal work of breathing Heart- Rapid, irregular rate and rhythm, no murmurs, rubs or gallops, PMI not laterally  displaced GI- soft, NT, ND, + BS Extremities- no clubbing, cyanosis, or edema MS- no significant deformity or atrophy Skin- no rash or lesion Psych- euthymic mood, full affect Neuro- strength and sensation are intact  EKG- Afib with rvr at 121 bpm, RAD, IRBBB, RVH, qrs int 104 ms, qtc 494 , Epic records reviewed Epic records extensively reviewed  Assessment and Plan: 1. Chronic afib with RVR Discussed with Dr. Rayann Heman Unfortunately, amiodarone not an option due to  use in the past and stopping  due to concerns of lung function,  and  there no other antiarrythmic's, that he would be eligible to take due to kidney function Cardizem at 360 mg controls his afib very well however contributes to more hypotension especially post dialysis and nephrology wants the dose not to exceed 120 mg. Continue metoprolol Per Dr. Rayann Heman, he is not a PVI candidate or a AV ablation/PPM candidate  2. Hypotension Consideration for backing off dry weight with dialysis so he would have more BP and may be able to tolerate higher doses of cardizem to have rate controlled afib Consideration for midodrine, per suggestion of PharmD, to augment BP, so  can get Cardizem at full dose on board   Unfortunately there is not a very easy solution for this very complex pt. Dr. Rayann Heman tried to contact Dr. Jimmy Footman to discuss but he was on vacation, left message for him to call and discuss on his return.  Geroge Baseman [place], [facility] [address] [place], [place] [address] [phone]

## 2015-03-19 ENCOUNTER — Telehealth: Payer: Self-pay | Admitting: Internal Medicine

## 2015-03-19 NOTE — Telephone Encounter (Signed)
New problem    Jolene stated  Dr Rayann Heman called Dr Deterding and stated the pt's  diltiazem dosage and treatment need to be adjusted and please call to disscuss with Jolene.

## 2015-03-19 NOTE — Telephone Encounter (Signed)
Butch Penny spoke with Velva Harman NP with nephrology.  She will further discuss case when Dr. Jimmy Footman returns from vacation but is hopeful they can spread patient's dialysis days out to every 4 days and have more room to increase cardizem. Recommendation to change dry weight is not best option as the patient is usually very fluid overloaded at dialysis days.   Dr. Jimmy Footman will call back and speak with Dr. Rayann Heman upon his return to discuss further.

## 2015-03-22 ENCOUNTER — Other Ambulatory Visit: Payer: Self-pay | Admitting: Thoracic Surgery (Cardiothoracic Vascular Surgery)

## 2015-03-22 ENCOUNTER — Telehealth: Payer: Self-pay

## 2015-03-22 ENCOUNTER — Ambulatory Visit: Payer: Self-pay | Admitting: Thoracic Surgery (Cardiothoracic Vascular Surgery)

## 2015-03-22 DIAGNOSIS — Z952 Presence of prosthetic heart valve: Secondary | ICD-10-CM

## 2015-03-22 NOTE — Progress Notes (Signed)
Cardiology Office Note   Date:  03/23/2015   ID:  [name], [name] 09-05-1940, MRN [id number]   Patient Care Team: [name] Pretty, MD as PCP - General (Internal Medicine) [name] Area, MD as Consulting Physician (Nephrology) [name] Margarita, MD as Consulting Physician (Cardiology) [name] Lance, MD as Consulting Physician (Clinical Cardiac Electrophysiology) [name] Mocha, MD as Consulting Physician (Cardiology)    Chief Complaint  Patient presents with  . Atrial Fibrillation     History of Present Illness: [name] is a 74 y.o. male with a hx of aortic stenosis, ESRD on hemodialysis MWF, chronic AFib, HOCM, HTN, diastolic HF, orthostatic hypotension, bladder CA s/p resection, paroxysmal VT.  He has a hx of LAA clot and is on chronic Warfarin.  He underwent TAVR 02/19/15 via a transapical approach.  Post op course was c/b R pleural effusion treated with chest tube.  He was DC to SNF.    Readmitted 11/4-11/6 with AF with RVR and elevated Troponin.  Elevated Troponin was felt to be related to demand ischemia.  He was seen by Dr. Glori [name] in consultation.  He was noted to develop rapid HR after dialysis with BP ranging 90/65-160/95.  HRs were as high as 160-170.  Rate improved on IV diltiazem. However, HR control was limited by low BP with AV nodal blockade.  It was noted that he failed Amiodarone 2 years ago (severely reduced DLCO).  He was DC on Cardizem CD 360 QD.  Of note, he required antibiotics for suspected HCAP.    After DC, the patient developed hypotension during dialysis and his Diltiazem was decreased.  He was referred to the AFib Clinic.  No antiarrhythmic options were available. Patient is not felt to be a PVI ablation candidate or a candidate for AVN ablation and PPM.  Apparently Midodrine was considered.  He is referred back to me today for further management.  Here today with his wife.  He tells me he has been weak with no energy for the last year.   When he went to the hospital 11/4, he felt no different than he had before.  He notes a non-productive cough.  He tells me that he feels no better after his TAVR.  Since his Diltiazem was decreased, he has had worsening issues with his HR going too fast at dialysis.  He denies orthopnea, PND.  He notes LE edema.  This is fairly stable.  He denies chest pain.  He denies syncope or near syncope.     Studies/Reports Reviewed Today:  Echo 03/06/15 Mod LVH, EF 55-60%, s/p TAVR with stable mild perivalvular AI, MAC, mild MR, severe LAE, mod RVE, mod RAE, mod TR, PASP 38 mmHg, L pleural effusion  LHC 12/16/14  Prox RCA lesion, 30% stenosed.  Dist RCA lesion, 25% stenosed.  Prox Cx lesion, 40% stenosed. Assessment: 1. Calcified but nonobstructive coronary artery disease 2. 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 Dense calcification of the aortic valve annulus extending into the mitral valve annulus, and at least moderate calcification of the ascending aorta. Plan: Continued evaluation by the Multidisciplinary Valve Team for TAVR versus conventional AVR      Past Medical History  Diagnosis Date  . Atrial fibrillation, persistent ([place])     a. cardioversion 11/21/10 . b. 05/2013 - experiencing more SOB. 24-hr holter showed avg HR 49, max 82, min 38bpm. ETT showed baseline junctional bradycardia, HR incr only to 90bpm with drop in BP. Saw EP -  amiodarone reduced and eventually d/c'd with improvement in dyspnea.  . Hypertension   . Warfarin anticoagulation   . Aortic valve stenosis   . Orthostatic hypotension   . Diastolic dysfunction   . Arthritis   . Self-catheterizes urinary bladder   . Junctional bradycardia   . NSVT (nonsustained ventricular tachycardia) ([place])     a. By holter 01/2014.  [name] Shortness of breath dyspnea   . PVD (peripheral vascular disease) ([place])   . Cardiomyopathy ([place])     non-ischemic  . Hypomagnesemia   . A-V fistula ([place])   . CAD (coronary  artery disease) 12/16/14    a. cath - nonobstrucive CAD 30% pro RCA; 25% dis RCA; 40T pro Lcx  . Heart murmur   . Dysrhythmia   . CHF (congestive heart failure) ([place])   . Full dentures   . Uses hearing aid   . Chronic diarrhea     takes imodium BID  . Pneumonia     hx of  . Hemodialysis patient [facility])     M,W,F - dialysis initiated August 2016  . Bruises easily   . Bladder cancer ([place])     a. 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  . 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 CKD (chronic kidney disease), stage V ([place])     a. Followed by Dr. Jimmy [name]. b. IV fistula placed 04/17/14. c. initiation of HD August 2016  . RTA (renal tubular acidosis)   . ESRD (end stage renal disease) on dialysis [facility])     "MWF; [name]" (03/05/2015)    Past Surgical History  Procedure Laterality Date  . Cardioversion  11/21/2010  . Cystectomy    . Cholecystectomy    . Cataract extraction w/ intraocular lens  implant, bilateral    . Colonoscopy    . Av fistula placement Left 04/17/2014    Procedure: ARTERIOVENOUS (AV) FISTULA CREATION- LEFT UPPER ARM ;  Surgeon: [name] [name], MD;  Location: [place];  Service: Vascular;  Laterality: Left;  . Bladder surgery      radical cystectomy  . Vascular surgery    . Multiple tooth extractions    . Cardiac catheterization N/A 12/16/2014    Procedure: Right/Left Heart Cath and Coronary Angiography;  Surgeon: [name] Mocha, MD;  Location: [place] CV LAB;  Service: Cardiovascular;  Laterality: N/A;  . Eye surgery    . Colon surgery  Nov 2002    reconstructed bladder from colon. per pt. at [facility]  . Tee without cardioversion N/A 01/01/2015    Procedure: TRANSESOPHAGEAL ECHOCARDIOGRAM (TEE);  Surgeon: [name] Dresser, MD;  Location: [facility] ENDOSCOPY;  Service: Cardiovascular;  Laterality: N/A;  . Transcatheter aortic valve replacement,  transapical N/A 02/16/2015    Procedure: TRANSCATHETER AORTIC VALVE REPLACEMENT, TRANSAPICAL;  Surgeon: [name], MD;  Location: [place];  Service: Open Heart Surgery;  Laterality: N/A;  . Tee without cardioversion N/A 02/16/2015    Procedure: TRANSESOPHAGEAL ECHOCARDIOGRAM (TEE);  Surgeon: [name], MD;  Location: [place];  Service: Open Heart Surgery;  Laterality: N/A;     Current Outpatient Prescriptions  Medication Sig Dispense Refill  . acetaminophen (TYLENOL) 500 MG tablet Take 1,000 mg by mouth every 6 (six) hours as needed for mild pain or moderate pain.    [name] aspirin 81 MG tablet Take 81 mg by mouth daily.    [name] B  Complex-C-Folic Acid (RENAL VITAMIN PO) Take 1 tablet by mouth daily.    [name] diltiazem (DILTIAZEM CD) 180 MG 24 hr capsule Take 180 mg by mouth daily.    . Loperamide HCl (IMODIUM PO) Take 1 tablet by mouth 2 (two) times daily.    . metoprolol tartrate (LOPRESSOR) 25 MG tablet Take 0.5 tablets (12.5 mg total) by mouth 2 (two) times daily. 60 tablet 1  . Multiple Vitamins-Minerals (MULTIVITAMIN WITH MINERALS) tablet Take 1 tablet by mouth daily.    . Pancrelipase, Lip-Prot-Amyl, (CREON) 24000 UNITS CPEP Take 48,000 Units by mouth 3 (three) times daily with meals.     . sevelamer carbonate (RENVELA) 800 MG tablet 1,600 mg 3 (three) times daily with meals.     . warfarin (COUMADIN) 2 MG tablet Take 2 mg by mouth daily.     No current facility-administered medications for this visit.    Allergies:   Amiodarone and Oxycodone hcl    Social History:   Social History   Social History  . Marital Status: Married    Spouse Name: N/A  . Number of Children: N/A  . Years of Education: N/A   Social History Main Topics  . Smoking status: Former Smoker -- 2.00 packs/day for 33 years    Types: Cigarettes    Quit date: 05/02/1983  . Smokeless tobacco: Never Used  . Alcohol Use: Yes     Comment: rare  . Drug Use: No  . Sexual Activity: Not Asked   Other Topics  Concern  . None   Social History Narrative     Family History:   Family History  Problem Relation Age of Onset  . CVA Mother   . Heart attack Father   . Heart disease Father   . Arrhythmia Father   . Alzheimer's disease Sister   . Multiple sclerosis Brother   . Alcohol abuse Father       ROS:   Please see the history of present illness.   Review of Systems  Constitution: Negative for fever.  Cardiovascular: Positive for dyspnea on exertion.  Respiratory: Positive for cough.   Gastrointestinal: Positive for constipation.  All other systems reviewed and are negative.     PHYSICAL EXAM: VS:  BP 108/60 mmHg  Pulse 86  Ht 5\' 8"  (1.727 m)  Wt 166 lb 6.4 oz (75.479 kg)  BMI 25.31 kg/m2  SpO2 98%    Wt Readings from Last 3 Encounters:  03/23/15 166 lb 6.4 oz (75.479 kg)  03/16/15 166 lb 12.8 oz (75.66 kg)  03/09/15 169 lb (76.658 kg)     GEN: Well nourished, well developed, in no acute distress HEENT: normal Neck: no JVD,   no masses Cardiac:  Normal S1/S2, irreg irreg rhythm; 1/6 diastolic murmur LLSB, [id number] bilateral LE edema   Respiratory:  clear to auscultation bilaterally, no wheezing, rhonchi or rales. GI: soft, nontender, nondistended, + BS MS: no deformity or atrophy Skin: warm and dry  Neuro:  CNs II-XII intact, Strength and sensation are intact Psych: Normal affect   EKG:  EKG is ordered today.  It demonstrates:   AFib, HR 122, TWI 1, aVL, V5-6   Recent Labs: 12/09/2014: Pro B Natriuretic peptide (BNP) 3135.0* 12/10/2014: B Natriuretic Peptide 3973.1* 03/05/2015: Magnesium 2.1 03/06/2015: ALT 69*; BUN 23*; Creatinine, Ser 3.36*; Hemoglobin 11.3*; Platelets 232; Potassium 4.4; Sodium 136; TSH 2.583    Lipid Panel No results found for: CHOL, TRIG, HDL, CHOLHDL, VLDL, LDLCALC, LDLDIRECT    ASSESSMENT AND PLAN:  1. Chronic Atrial Fibrillation:  Rate control is inhibited by hypotension from rate controlling medications.  He is not a candidate for  Tikosyn.  He had to stop Amiodarone due to lung toxicity.  He cannot take Digoxin.  HR is better with higher dose Diltiazem.  However, this drops his BP too low and limits dialysis.  He is not felt to be a candidate for PVI ablation or AVN ablation with PPM implantation.  Options are extremely limited.  I reviewed his case with Dr. Fransico Him today.  She requests that he see Dr. Curt Bears for further evaluation. We will arrange an appointment with Dr. Allegra Lai today or tomorrow.  2. Aortic Stenosis s/p TAVR:  Recent echo with stable TAVR. FU with Dr. Roxy Manns as planned.  Continue SBE prophylaxis.   3. ESRD:  On MWF dialysis.  4. Chronic Diastolic CHF:  Volume management per dialysis.    5. CAD:  Non-obstructive by [place] 8/16.  6. HTN:  As noted, hypotension limits rate control therapy.      Medication Changes: Current medicines are reviewed at length with the patient today.  Concerns regarding medicines are as outlined above.  The following changes have been made:   Discontinued Medications   DILTIAZEM (CARDIZEM CD) 120 MG 24 HR CAPSULE    Take 120 mg by mouth daily.   FERRIC GLUCONATE 125 MG IN SODIUM CHLORIDE 0.9 % 100 ML    Inject 125 mg into the vein every Monday, Wednesday, and Friday with hemodialysis.   Modified Medications   No medications on file   New Prescriptions   No medications on file   Labs/ tests ordered today include:   Orders Placed This Encounter  Procedures  . EKG 12-Lead     Disposition:    Refer to Dr. Allegra Lai (EP).     Signed, Versie Starks, MHS 03/23/2015 12:29 PM    [address], [place], [place]  [address] Phone: [phone]; Fax: [phone]

## 2015-03-22 NOTE — Telephone Encounter (Signed)
Please get patient in to see Richardson Dopp this week        Traci    ----- Message -----     From: Sherran Needs, NP     Sent: 03/17/2015  4:18 PM      To: Sueanne Margarita, MD        Dr. Radford Pax, please see my note on Mr. Marco. Discussed with Dr. Rayann Heman and he would not be eligible for any other antiarrythmic's, and he has failed amiodarone in the past. He has a afib with rvr, but nephrology does not want him on higher dose of cardizem than 120 mg a day due to post dialysis hypotension,360 mg has him rate controlled. Dr. Rayann Heman placed a call to Dr. Archie Balboa, to maybe back off dry weight, but he is on vacation. PharmD suggested midodine to keep up BP, but I dont know whose call that would be to start. He has an appointment with Richardson Dopp, I believe 12/8, but I think he may need f/u before this. Please review and see if sooner f/u with you or Nicki Reaper can be arranged. Under the circumstances, I have no options for this complex pt. Thank you, Roderic Palau      Left message for patient to call back.

## 2015-03-22 NOTE — Telephone Encounter (Signed)
Scheduled appointment tomorrow, 11/22 at 0930 with Richardson Dopp. Patient's wife agrees with treatment plan.

## 2015-03-23 ENCOUNTER — Ambulatory Visit (INDEPENDENT_AMBULATORY_CARE_PROVIDER_SITE_OTHER): Payer: MEDICARE | Admitting: Thoracic Surgery (Cardiothoracic Vascular Surgery)

## 2015-03-23 ENCOUNTER — Ambulatory Visit: Payer: Self-pay | Admitting: Thoracic Surgery (Cardiothoracic Vascular Surgery)

## 2015-03-23 ENCOUNTER — Ambulatory Visit (INDEPENDENT_AMBULATORY_CARE_PROVIDER_SITE_OTHER): Payer: MEDICARE | Admitting: *Deleted

## 2015-03-23 ENCOUNTER — Encounter: Payer: Self-pay | Admitting: Thoracic Surgery (Cardiothoracic Vascular Surgery)

## 2015-03-23 ENCOUNTER — Ambulatory Visit (HOSPITAL_COMMUNITY)
Admission: RE | Admit: 2015-03-23 | Discharge: 2015-03-23 | Disposition: A | Discharge status: Home / Self Care | Payer: MEDICARE | Source: Ambulatory Visit | Attending: Cardiovascular Disease | Admitting: Cardiovascular Disease

## 2015-03-23 ENCOUNTER — Encounter: Payer: Self-pay | Admitting: Physician Assistant

## 2015-03-23 ENCOUNTER — Ambulatory Visit (INDEPENDENT_AMBULATORY_CARE_PROVIDER_SITE_OTHER): Payer: MEDICARE | Admitting: Physician Assistant

## 2015-03-23 ENCOUNTER — Ambulatory Visit
Admission: RE | Admit: 2015-03-23 | Discharge: 2015-03-23 | Disposition: A | Discharge status: Home / Self Care | Payer: MEDICARE | Source: Ambulatory Visit | Attending: Thoracic Surgery (Cardiothoracic Vascular Surgery) | Admitting: Thoracic Surgery (Cardiothoracic Vascular Surgery)

## 2015-03-23 ENCOUNTER — Ambulatory Visit: Payer: Self-pay | Admitting: Physician Assistant

## 2015-03-23 VITALS — BP 108/60 | HR 86 | Ht 68.0 in | Wt 166.4 lb

## 2015-03-23 VITALS — BP 115/53 | HR 68 | Resp 16 | Ht 68.0 in | Wt 166.0 lb

## 2015-03-23 DIAGNOSIS — Z952 Presence of prosthetic heart valve: Secondary | ICD-10-CM

## 2015-03-23 DIAGNOSIS — I050 Rheumatic mitral stenosis: Secondary | ICD-10-CM | POA: Insufficient documentation

## 2015-03-23 DIAGNOSIS — T8203XA Leakage of heart valve prosthesis, initial encounter: Secondary | ICD-10-CM | POA: Insufficient documentation

## 2015-03-23 DIAGNOSIS — I5033 Acute on chronic diastolic (congestive) heart failure: Secondary | ICD-10-CM | POA: Diagnosis not present

## 2015-03-23 DIAGNOSIS — I350 Nonrheumatic aortic (valve) stenosis: Secondary | ICD-10-CM | POA: Insufficient documentation

## 2015-03-23 DIAGNOSIS — I481 Persistent atrial fibrillation: Secondary | ICD-10-CM

## 2015-03-23 DIAGNOSIS — I071 Rheumatic tricuspid insufficiency: Secondary | ICD-10-CM | POA: Diagnosis not present

## 2015-03-23 DIAGNOSIS — I517 Cardiomegaly: Secondary | ICD-10-CM | POA: Diagnosis not present

## 2015-03-23 DIAGNOSIS — I340 Nonrheumatic mitral (valve) insufficiency: Secondary | ICD-10-CM | POA: Diagnosis not present

## 2015-03-23 DIAGNOSIS — I2510 Atherosclerotic heart disease of native coronary artery without angina pectoris: Secondary | ICD-10-CM

## 2015-03-23 DIAGNOSIS — I5032 Chronic diastolic (congestive) heart failure: Secondary | ICD-10-CM | POA: Diagnosis not present

## 2015-03-23 DIAGNOSIS — Z954 Presence of other heart-valve replacement: Secondary | ICD-10-CM | POA: Diagnosis not present

## 2015-03-23 DIAGNOSIS — I482 Chronic atrial fibrillation, unspecified: Secondary | ICD-10-CM

## 2015-03-23 DIAGNOSIS — I4819 Other persistent atrial fibrillation: Secondary | ICD-10-CM

## 2015-03-23 DIAGNOSIS — I4891 Unspecified atrial fibrillation: Secondary | ICD-10-CM

## 2015-03-23 DIAGNOSIS — I10 Essential (primary) hypertension: Secondary | ICD-10-CM

## 2015-03-23 DIAGNOSIS — N186 End stage renal disease: Secondary | ICD-10-CM

## 2015-03-23 DIAGNOSIS — T8203XD Leakage of heart valve prosthesis, subsequent encounter: Secondary | ICD-10-CM

## 2015-03-23 LAB — POCT INR: INR: 1.8

## 2015-03-23 NOTE — Patient Instructions (Signed)

## 2015-03-23 NOTE — Patient Instructions (Signed)
Medication Instructions:  Your physician recommends that you continue on your current medications as directed. Please refer to the Current Medication list given to you today.   Labwork: NONE  Testing/Procedures: NONE  Follow-Up: DR. Curt Bears; WE WILL HAVE MELISSA OUR EP SCHEDULER CALL YOU WITH AN APPT TO SEE DR. Curt Bears   Any Other Special Instructions Will Be Listed Below (If Applicable).   If you need a refill on your cardiac medications before your next appointment, please call your pharmacy.

## 2015-03-23 NOTE — Progress Notes (Signed)
[name]      CARDIOTHORACIC SURGERY NOTE  Referring Provider is [name], MD PCP is [name], MD   HPI:  Patient returns to the office today for routine follow-up approximately one month status post transcatheter aortic valve replacement be a transapical approach on 02/16/2015.  He was last seen here in our office approximately 2 weeks ago on 03/09/2015. Over the past 2 weeks the patient reports continued problems with exertional shortness of breath and fatigue that waxes and wanes to some degree and severity but remain problematic to say the least. He has continued to have problems with rapid atrial fibrillation that has been difficult to control. He has been instructed to cut back his dose of diltiazem by his nephrologist because of hypotension and then later instructed to increase his dose of diltiazem by his cardiologists.  He was referred to the atrial fibrillation clinic by Dr. Radford Pax and evaluated by Dr. Rayann Heman and Roderic Palau on 03/16/2015. He now has an appointment to see Dr. Curt Bears tomorrow.  The patient states that he does not feel any better than he did prior to TAVR, although both he and his wife admit that he is doing better than he was when he initially presented last August.  However, the patient notes that he feels very poorly after dialysis treatments and he has had increasing problems with tachycardia and hypotension during and following dialysis treatments. He denies any symptoms of chest pain or chest tightness either with activity or rest. He no longer has any soreness related to his surgery. He denies resting shortness of breath, PND, dizzy spells, or syncope. He has been having problems with lower extremity edema. Appetite is fair. Energy level and strength is poor. He has not had fevers or chills.   Current Outpatient Prescriptions  Medication Sig Dispense Refill  . acetaminophen (TYLENOL) 500  MG tablet Take 1,000 mg by mouth every 6 (six) hours as needed for mild pain or moderate pain.    [name] aspirin 81 MG tablet Take 81 mg by mouth daily.    . B Complex-C-Folic Acid (RENAL VITAMIN PO) Take 1 tablet by mouth daily.    [name] diltiazem (DILTIAZEM CD) 180 MG 24 hr capsule Take 180 mg by mouth daily.    . Loperamide HCl (IMODIUM PO) Take 1 tablet by mouth 2 (two) times daily.    . metoprolol tartrate (LOPRESSOR) 25 MG tablet Take 0.5 tablets (12.5 mg total) by mouth 2 (two) times daily. 60 tablet 1  . Multiple Vitamins-Minerals (MULTIVITAMIN WITH MINERALS) tablet Take 1 tablet by mouth daily.    . Pancrelipase, Lip-Prot-Amyl, (CREON) 24000 UNITS CPEP Take 48,000 Units by mouth 3 (three) times daily with meals.     . sevelamer carbonate (RENVELA) 800 MG tablet 1,600 mg 3 (three) times daily with meals.     . warfarin (COUMADIN) 2 MG tablet Take 2 mg by mouth daily. OR AS DIRECTED     No current facility-administered medications for this visit.      Physical Exam:   BP 115/53 mmHg  Pulse 68  Resp 16  Ht 5\' 8"  (1.727 m)  Wt 166 lb (75.297 kg)  BMI 25.25 kg/m2  SpO2 94%  General:  Chronically ill-appearing  Chest:   Clear to auscultation  CV:   Irregular rate and rhythm with soft systolic murmur and diastolic murmur heard best at left lower sternal border  Incisions:  Small chest tube incision left lower  chest wall is clean and almost completely healed. All remaining incisions are well-healed, including the left mini thoracotomy incision  Abdomen:  Soft and nontender  Extremities:  Warm and well-perfused, moderate bilateral lower extremity edema  Diagnostic Tests:  Transthoracic Echocardiography  Patient:  [name], [name] MR #:    [id number] Study Date: 03/23/2015 Gender:   M Age:    74 Height:   172.7 cm Weight:   75.3 kg BSA:    1.91 m^2 Pt. Status: Room:  ATTENDING  [name], MD ORDERING   [name], MD REFERRING  [name],  MD PERFORMING  [facility], Outpatient SONOGRAPHER Mikki Santee  cc:  ------------------------------------------------------------------- LV EF: 60% -  65%  ------------------------------------------------------------------- Indications:   Aortic stenosis 424.1.  ------------------------------------------------------------------- History:  PMH: S/P TAVR. Dyspnea. Atrial fibrillation. Coronary artery disease. Congestive heart failure. Risk factors: Hypertension.  ------------------------------------------------------------------- Study Conclusions  - Left ventricle: The cavity size was normal. There was severe concentric hypertrophy. Systolic function was normal. The estimated ejection fraction was in the range of 60% to 65%. Wall motion was normal; there were no regional wall motion abnormalities. The study is not technically sufficient to allow evaluation of LV diastolic function. - Aortic valve: Stent valve. No obstruction or paravalvular leak. Mean gradient (S): 8 mm Hg. Peak gradient (S): 23 mm Hg. - Mitral valve: Calcified annulus. There is mild stenosis. There was mild regurgitation. Mean gradient (D): 8 mm Hg. - Left atrium: Severely dilated at 60 ml/m2. - Right atrium: Massively dilated at 35 cm2. - Atrial septum: No defect or patent foramen ovale was identified. - Tricuspid valve: There was mild regurgitation. - Pulmonary arteries: PA peak pressure: 38 mm Hg (S). - Inferior vena cava: The vessel was normal in size. The respirophasic diameter changes were in the normal range (>= 50%), consistent with normal central venous pressure.  Impressions:  - Compared to the most recent echo in 03/2015, the EF is stable. There is no longer a paravalvular leak. There is massive biatrial enlargement. There may be mild mitral stenosis with MAC and mild MR.  ------------------------------------------------------------------- Labs, prior  tests, procedures, and surgery: TAVR.     Transthoracic echocardiography. M-mode, complete 2D, spectral Doppler, and color Doppler. Birthdate: Patient birthdate: 1940-07-18. Age: Patient is 74 yr old. Sex: Gender: male.  BMI: 25.2 kg/m^2. Blood pressure:   108/60 Patient status: Outpatient. Study date: Study date: 03/23/2015. Study time: 02:25 PM. Location: Echo laboratory.  -------------------------------------------------------------------  ------------------------------------------------------------------- Left ventricle: The cavity size was normal. There was severe concentric hypertrophy. Systolic function was normal. The estimated ejection fraction was in the range of 60% to 65%. Wall motion was normal; there were no regional wall motion abnormalities. The study is not technically sufficient to allow evaluation of LV diastolic function.  ------------------------------------------------------------------- Aortic valve: Stent valve. No obstruction or paravalvular leak. Doppler:   VTI ratio of LVOT to aortic valve: 0.6. Peak velocity ratio of LVOT to aortic valve: 0.59. Mean velocity ratio of LVOT to aortic valve: 0.54.  Mean gradient (S): 8 mm Hg. Peak gradient (S): 23 mm Hg.  ------------------------------------------------------------------- Aorta: Aortic root: The aortic root was normal in size. Ascending aorta: The ascending aorta was normal in size.  ------------------------------------------------------------------- Mitral valve:  Calcified annulus. Doppler: There was mild regurgitation.  Mean gradient (D): 8 mm Hg.  ------------------------------------------------------------------- Left atrium: Severely dilated at 60 ml/m2.  ------------------------------------------------------------------- Atrial septum: No defect or patent foramen ovale was  identified.  ------------------------------------------------------------------- Pulmonic valve:  The valve appears to be grossly normal.  Doppler: There was trivial regurgitation.  ------------------------------------------------------------------- Tricuspid valve:  Doppler: There was mild regurgitation.  ------------------------------------------------------------------- Pulmonary artery:  The main pulmonary artery was normal-sized.  ------------------------------------------------------------------- Right atrium: Massively dilated at 35 cm2.  ------------------------------------------------------------------- Pericardium: There was no pericardial effusion.  ------------------------------------------------------------------- Systemic veins: Inferior vena cava: The vessel was normal in size. The respirophasic diameter changes were in the normal range (>= 50%), consistent with normal central venous pressure.  ------------------------------------------------------------------- Measurements  Left ventricle             Value    Reference LV ID, ED, PLAX chordal        45  mm   43 - 52 LV ID, ES, PLAX chordal        30  mm   23 - 38 LV fx shortening, PLAX chordal     33  %   >=29 LV PW thickness, ED          15  mm   --------- IVS/LV PW ratio, ED          1.07     <=1.3  Ventricular septum           Value    Reference IVS thickness, ED           16  mm   ---------  LVOT                  Value    Reference LVOT peak velocity, S         140  cm/s  --------- LVOT mean velocity, S         69.3 cm/s  --------- LVOT VTI, S              21.7 cm   --------- LVOT peak gradient, S         8   mm Hg ---------  Aortic valve              Value    Reference Aortic valve  peak velocity, S     238  cm/s  --------- Aortic valve mean velocity, S     129  cm/s  --------- Aortic valve VTI, S          35.9 cm   --------- Aortic mean gradient, S        8   mm Hg --------- Aortic peak gradient, S        23  mm Hg --------- VTI ratio, LVOT/AV           0.6     --------- Velocity ratio, peak, LVOT/AV     0.59     --------- Velocity ratio, mean, LVOT/AV     0.54     ---------  Aorta                 Value    Reference Aortic root ID, ED           26  mm   ---------  Left atrium              Value    Reference LA ID, A-P, ES             56  mm   --------- LA ID/bsa, A-P         (H)   2.93 cm/m^2 <=2.2 LA volume, S              116  ml   --------- LA volume/bsa, S  60.7 ml/m^2 --------- LA volume, ES, 1-p A4C         112  ml   --------- LA volume/bsa, ES, 1-p A4C       58.6 ml/m^2 --------- LA volume, ES, 1-p A2C         114  ml   --------- LA volume/bsa, ES, 1-p A2C       59.7 ml/m^2 ---------  Mitral valve              Value    Reference Mitral mean velocity, D        136  cm/s  --------- Mitral mean gradient, D        8   mm Hg --------- Mitral annulus VTI, D         58.1 cm   ---------  Pulmonary arteries           Value    Reference PA pressure, S, DP       (H)   38  mm Hg <=30  Tricuspid valve            Value    Reference Tricuspid regurg peak velocity     296  cm/s  --------- Tricuspid peak RV-RA gradient     35  mm Hg ---------  Legend: (L) and (H) mark values outside specified reference range.  ------------------------------------------------------------------- Prepared and  Electronically Authenticated by  Lyman Bishop MD 2016-11-22T17:[id number]:32    CHEST 2 VIEW  COMPARISON: March 15, 2015  FINDINGS: Small pleural effusions are present. There is no frank edema or consolidation. There is slight left base and left upper lobe atelectasis. There is cardiomegaly with pulmonary vascularity within normal limits. No adenopathy. There is a prosthetic aortic valve present. There is atherosclerotic change in the aorta. There is degenerative change in the thoracic spine.  IMPRESSION: Cardiomegaly with small pleural effusions. There may be mild congestive heart failure. However, there is no edema, and the pulmonary vascularity is regarded as within normal limits. There is mild atelectatic change in the left base and left upper lobe regions.   Electronically Signed  By: Lowella Grip III M.D.  On: 03/23/2015 15:41   Impression:  Although Mr Chai has not had any surgical complications, he continues to struggle approximately 1 month status post transcatheter aortic valve replacement via transapical approach. He continues to experience symptoms of severe exertional shortness of breath and fatigue consistent with chronic diastolic congestive heart failure, [place] Heart Association functional class IIIB.  He remains in chronic atrial fibrillation and has been struggling with adequate heart rate control.  I have personally reviewed the follow-up transthoracic echocardiogram performed earlier today. Left ventricular systolic function remains stable with ejection fraction estimated >60%.  Peak velocity across the aortic valve prosthesis ranged between 2.2 and 2.4 m/s corresponding to mean transvalvular gradient estimated 9 mmHg. I disagree with the official interpretation of this echocardiogram in that I believe the paravalvular leak persists. The patient has an obvious diastolic murmur on physical exam and the paravalvular leak can be seen on some of the  echocardiogram images.  I do not feel that the paravalvular leak appears any worse than it did at the time of surgery. The patient has moderate to severe diastolic dysfunction but no signs of significant dynamic left ventricular outflow tract obstruction. The patient has mild mitral regurgitation and may have mild mitral stenosis.  Given his underlying cardiac pathology it is not surprising that he does not tolerate atrial fibrillation when his heart rate  is not adequately controlled. Unfortunately, even with improved heart rate control I would expect that Mr. Mckechnie will continue to experience problems tolerating hemodialysis and have significant symptoms of chronic diastolic congestive heart failure.   Plan:  I had a long conversation with the patient and his wife in the office this afternoon. We discussed the many reasons why the patient continues to have problems with symptoms of chronic diastolic congestive heart failure. I have not recommended any changes to the patient's current medications at this time. We have discussed several possibilities, such as breaking his dose of diltiazem into smaller divided doses or trying to increase his beta blocker.   I would agree that Mr. Babayan should not be considered a candidate for an attempt at ablation for atrial fibrillation.  In his condition the risks would be considerable and the likelihood of long-term success probably very low.  In the long run it may be important that the patient's nephrologist and cardiologist work together to establish a coordinated plan to manage his medications for atrial fibrillation and hypertension.  In the meanwhile I have encouraged the patient to begin working with home health physical therapy on days that he does not go to dialysis.  Arrangements for home health physical therapy are in place, but the patient has not been able to get started yet for a variety of reasons.  All of his questions have been addressed.  We will plan  to obtain a follow-up echocardiogram in 6 months to reassess the severity of his paravalvular leak.  I spent in excess of 30 minutes during the conduct of this office consultation and >50% of this time involved direct face-to-face encounter with the patient for counseling and/or coordination of their care.     Valentina Gu. Roxy Manns, MD 03/23/2015 6:16 PM

## 2015-03-23 NOTE — Progress Notes (Signed)
  Echocardiogram 2D Echocardiogram has been performed.  Jennette Dubin 03/23/2015, 3:11 PM

## 2015-03-24 ENCOUNTER — Encounter: Payer: Self-pay | Admitting: Cardiology

## 2015-03-24 ENCOUNTER — Ambulatory Visit (INDEPENDENT_AMBULATORY_CARE_PROVIDER_SITE_OTHER): Payer: MEDICARE | Admitting: Cardiology

## 2015-03-24 VITALS — BP 122/70 | HR 90 | Ht 67.0 in | Wt 153.6 lb

## 2015-03-24 DIAGNOSIS — I482 Chronic atrial fibrillation, unspecified: Secondary | ICD-10-CM

## 2015-03-24 DIAGNOSIS — I2510 Atherosclerotic heart disease of native coronary artery without angina pectoris: Secondary | ICD-10-CM

## 2015-03-24 MED ORDER — DILTIAZEM HCL ER COATED BEADS 180 MG PO CP24: 180.0000 mg | ORAL_CAPSULE | Freq: Every day | ORAL | Status: DC

## 2015-03-24 MED ORDER — METOPROLOL TARTRATE 25 MG PO TABS: ORAL_TABLET | ORAL | Status: DC

## 2015-03-24 NOTE — Progress Notes (Signed)
Electrophysiology Office Note   Date:  03/24/2015   ID:  [name], [name] [birth year], MRN [id number]  PCP:  [name], MD  Cardiologist:  [name], MD Primary Electrophysiologist:  Bobie Caris Meredith Leeds, MD    No chief complaint on file.    History of Present Illness: [name] is a 74 y.o. male who presents today for electrophysiology evaluation.   He has a history of aortic stenosis, end-stage renal disease on dialysis, atrial fibrillation, HOCM, hypertension, diastolic heart failure, orthostatic hypotension, bladder cancer status post resection, and DVT. He has a history of left atrial appendage clot and is currently on chronic warfarin therapy. He had a TAVR 02/19/15 via the transapical approach. He was readmitted November 4 with A. fib with RVR and elevated troponin which was thought due to demand ischemia he has tried amiodarone 2 years ago but failed that due to a severely reduced DLCO. He was discharged on Cardizem CD 360 daily.he had severe he had a suspected hospital-acquired them pneumonia and was discharged on antibiotics. Since his discharge he is developed hypotension with dialysis and his diltiazem was decreased. He does complain of significant fatigue and weakness as well as exercise intolerance. He says that he had to rest when walking in from the parking lot today.   Today, he denies symptoms of palpitations, chest pain, shortness of breath, orthopnea, PND, lower extremity edema, claudication, dizziness, presyncope, syncope, bleeding, or neurologic sequela. The patient is tolerating medications without difficulties and is otherwise without complaint today.    Past Medical History  Diagnosis Date  . Atrial fibrillation, persistent ([place])     a. cardioversion 11/21/10 . b. 05/2013 - experiencing more SOB. 24-hr holter showed avg HR 49, max 82, min 38bpm. ETT showed baseline junctional bradycardia, HR incr only to 90bpm with drop in BP. Saw EP -  amiodarone reduced and eventually d/c'd with improvement in dyspnea.  . Hypertension   . Warfarin anticoagulation   . Aortic valve stenosis   . Orthostatic hypotension   . Diastolic dysfunction   . Arthritis   . Self-catheterizes urinary bladder   . Junctional bradycardia   . NSVT (nonsustained ventricular tachycardia) ([place])     a. By holter 01/2014.  [name] Shortness of breath dyspnea   . PVD (peripheral vascular disease) ([place])   . Cardiomyopathy ([place])     non-ischemic  . Hypomagnesemia   . A-V fistula ([place])   . CAD (coronary artery disease) 12/16/14    a. cath - nonobstrucive CAD 30% pro RCA; 25% dis RCA; 40T pro Lcx  . Heart murmur   . Dysrhythmia   . CHF (congestive heart failure) ([place])   . Full dentures   . Uses hearing aid   . Chronic diarrhea     takes imodium BID  . Pneumonia     hx of  . Hemodialysis patient [facility])     M,W,F - dialysis initiated August 2016  . Bruises easily   . Bladder cancer ([place])     a. 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  . 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 CKD (chronic kidney disease), stage V ([place])     a. Followed by Dr. Jimmy Footman. b. IV fistula placed 04/17/14. c. initiation of HD August 2016  . RTA (renal tubular acidosis)   . ESRD (end stage renal disease) on dialysis ([place])     "  MWF; [name]" (03/05/2015)  . Paravalvular leak of prosthetic heart valve    Past Surgical History  Procedure Laterality Date  . Cardioversion  11/21/2010  . Cystectomy    . Cholecystectomy    . Cataract extraction w/ intraocular lens  implant, bilateral    . Colonoscopy    . Av fistula placement Left 04/17/2014    Procedure: ARTERIOVENOUS (AV) FISTULA CREATION- LEFT UPPER ARM ;  Surgeon: Mal [name], MD;  Location: [facility];  Service: Vascular;  Laterality: Left;  . Bladder surgery      radical cystectomy  . Vascular  surgery    . Multiple tooth extractions    . Cardiac catheterization N/A 12/16/2014    Procedure: Right/Left Heart Cath and Coronary Angiography;  Surgeon: [name], MD;  Location: [place] CV LAB;  Service: Cardiovascular;  Laterality: N/A;  . Eye surgery    . Colon surgery  Nov 2002    reconstructed bladder from colon. per pt. at [facility]  . Tee without cardioversion N/A 01/01/2015    Procedure: TRANSESOPHAGEAL ECHOCARDIOGRAM (TEE);  Surgeon: [name], MD;  Location: [facility] ENDOSCOPY;  Service: Cardiovascular;  Laterality: N/A;  . Transcatheter aortic valve replacement, transapical N/A 02/16/2015    Procedure: TRANSCATHETER AORTIC VALVE REPLACEMENT, TRANSAPICAL;  Surgeon: [name], MD;  Location: [facility];  Service: Open Heart Surgery;  Laterality: N/A;  . Tee without cardioversion N/A 02/16/2015    Procedure: TRANSESOPHAGEAL ECHOCARDIOGRAM (TEE);  Surgeon: [name], MD;  Location: [facility];  Service: Open Heart Surgery;  Laterality: N/A;     Current Outpatient Prescriptions  Medication Sig Dispense Refill  . acetaminophen (TYLENOL) 500 MG tablet Take 1,000 mg by mouth every 6 (six) hours as needed for mild pain or moderate pain.    [name] aspirin 81 MG tablet Take 81 mg by mouth daily.    . B Complex-C-Folic Acid (RENAL VITAMIN PO) Take 1 tablet by mouth daily.    [name] diltiazem (DILTIAZEM CD) 180 MG 24 hr capsule Take 180 mg by mouth daily.    . Loperamide HCl (IMODIUM PO) Take 1 tablet by mouth 2 (two) times daily.    . metoprolol tartrate (LOPRESSOR) 25 MG tablet Take 0.5 tablets (12.5 mg total) by mouth 2 (two) times daily. 60 tablet 1  . Multiple Vitamins-Minerals (MULTIVITAMIN WITH MINERALS) tablet Take 1 tablet by mouth daily.    . Pancrelipase, Lip-Prot-Amyl, (CREON) 24000 UNITS CPEP Take 48,000 Units by mouth 3 (three) times daily with meals.     . sevelamer carbonate (RENVELA) 800 MG tablet 1,600 mg 3 (three) times daily with meals.     . warfarin (COUMADIN) 2 MG tablet  Take 2 mg by mouth daily. OR AS DIRECTED     No current facility-administered medications for this visit.    Allergies:   Amiodarone and Oxycodone hcl   Social History:  The patient  reports that he quit smoking about 31 years ago. His smoking use included Cigarettes. He has a 66 pack-year smoking history. He has never used smokeless tobacco. He reports that he drinks alcohol. He reports that he does not use illicit drugs.   Family History:  The patient's family history includes Alcohol abuse in his father; Alzheimer's disease in his sister; Arrhythmia in his father; CVA in his mother; Heart attack in his father; Heart disease in his father; Multiple sclerosis in his brother.    ROS:  Please see the history of present illness. All other systems are reviewed  and negative.    PHYSICAL EXAM: VS:  There were no vitals taken for this visit. , BMI There is no weight on file to calculate BMI. GEN: Well nourished, well developed, in no acute distress HEENT: normal Neck: no JVD, carotid bruits, or masses Cardiac: irregular rhythm; no murmurs, rubs, or gallops,no edema  Respiratory:  clear to auscultation bilaterally, normal work of breathing GI: soft, nontender, nondistended, + BS MS: no deformity or atrophy Skin: warm and dry Neuro:  Strength and sensation are intact Psych: euthymic mood, full affect  EKG:  EKG is not ordered today. The ekg ordered 03/16/15 shows atrial fibrillation  Recent Labs: 12/09/2014: Pro B Natriuretic peptide (BNP) 3135.0* 12/10/2014: B Natriuretic Peptide 3973.1* 03/05/2015: Magnesium 2.1 03/06/2015: ALT 69*; BUN 23*; Creatinine, Ser 3.36*; Hemoglobin 11.3*; Platelets 232; Potassium 4.4; Sodium 136; TSH 2.583    Lipid Panel  No results found for: CHOL, TRIG, HDL, CHOLHDL, VLDL, LDLCALC, LDLDIRECT   Wt Readings from Last 3 Encounters:  03/23/15 166 lb (75.297 kg)  03/23/15 166 lb 6.4 oz (75.479 kg)  03/16/15 166 lb 12.8 oz (75.66 kg)      Other studies  Reviewed: Additional studies/ records that were reviewed today include: TTE 03/23/15 Review of the above records today demonstrates:  - Left ventricle: The cavity size was normal. There was severe concentric hypertrophy. Systolic function was normal. The estimated ejection fraction was in the range of 60% to 65%. Wall motion was normal; there were no regional wall motion abnormalities. The study is not technically sufficient to allow evaluation of LV diastolic function. - Aortic valve: Stent valve. No obstruction or paravalvular leak. Mean gradient (S): 8 mm Hg. Peak gradient (S): 23 mm Hg. - Mitral valve: Calcified annulus. There is mild stenosis. There was mild regurgitation. Mean gradient (D): 8 mm Hg. - Left atrium: Severely dilated at 60 ml/m2. - Right atrium: Massively dilated at 35 cm2. - Atrial septum: No defect or patent foramen ovale was identified. - Tricuspid valve: There was mild regurgitation. - Pulmonary arteries: PA peak pressure: 38 mm Hg (S). - Inferior vena cava: The vessel was normal in size. The respirophasic diameter changes were in the normal range (>= 50%), consistent with normal central venous pressure.   ASSESSMENT AND PLAN:  1. Chronic Atrial Fibrillation:Has severe dilation of his left atrium at 60 mm. Has also tried amiodarone and dofetilide which have not worked for him in the past. He is on dialysis and therefore antiarrhythmic options are limited. With his severely enlarged left atrium it is unlikely that we Derrien Anschutz be able to get him out of atrial fibrillation. He does complain of weakness and fatigue and this is probably related to his permanent atrial fibrillation. To better control his heart rate we Bernese Doffing continue his Cardizem but we Carthel Castille increase his metoprolol to 50 mg in the morning and 25 mg in the evening. Hopefully this Ouida Abeyta not effect his blood pressure at dialysis.   2. Aortic Stenosis s/p TAVR: Stable post TAVR without any  paravalvular leak.  Continued follow up with surgery.  3. ESRD: On MWF dialysis.  4. Chronic Diastolic CHF: Volume management per dialysis.   5. CAD: Non-obstructive by [place] 8/16.  6. HTN: As noted, hypotension limits rate control therapy.     Current medicines are reviewed at length with the patient today.   The patient has concerns regarding his medicines.  The following changes were made today:  Metoprolol 50 mg in the morning 25 mg in the evening  Labs/ tests ordered today include:  No orders of the defined types were placed in this encounter.     Disposition:   FU with Vesta [place] 3 months  Signed, Jefry Lesinski Meredith Leeds, MD  03/24/2015 8:30 AM     [facility] [place] [address] [phone] (office) [phone] (fax)

## 2015-03-24 NOTE — Patient Instructions (Signed)
Medication Instructions:  Your physician has recommended you make the following change in your medication: 1) CHANGE the way you take METOPROLOL - take 50 mg in the morning and take 25 mg at night  Labwork: None ordered  Testing/Procedures: None ordered  Follow-Up: Your physician recommends that you schedule a follow-up appointment in: 3 months with Dr. Curt Bears.  If you need a refill on your cardiac medications before your next appointment, please call your pharmacy.  Thank you for choosing [facility]!!   Trinidad Curet, RN [phone]

## 2015-03-30 ENCOUNTER — Ambulatory Visit (INDEPENDENT_AMBULATORY_CARE_PROVIDER_SITE_OTHER): Payer: MEDICARE | Admitting: Pharmacist

## 2015-03-30 DIAGNOSIS — I4891 Unspecified atrial fibrillation: Secondary | ICD-10-CM

## 2015-03-30 LAB — POCT INR: INR: 4.7

## 2015-04-02 ENCOUNTER — Encounter (HOSPITAL_COMMUNITY): Payer: Self-pay | Admitting: Family Medicine

## 2015-04-02 ENCOUNTER — Emergency Department (HOSPITAL_COMMUNITY)
Admission: EM | Admit: 2015-04-02 | Discharge: 2015-04-02 | Disposition: A | Discharge status: Home / Self Care | Payer: MEDICARE | Attending: Emergency Medicine | Admitting: Emergency Medicine

## 2015-04-02 ENCOUNTER — Emergency Department (HOSPITAL_COMMUNITY): Payer: MEDICARE

## 2015-04-02 DIAGNOSIS — Z87891 Personal history of nicotine dependence: Secondary | ICD-10-CM | POA: Insufficient documentation

## 2015-04-02 DIAGNOSIS — Z992 Dependence on renal dialysis: Secondary | ICD-10-CM | POA: Diagnosis not present

## 2015-04-02 DIAGNOSIS — Z79899 Other long term (current) drug therapy: Secondary | ICD-10-CM | POA: Insufficient documentation

## 2015-04-02 DIAGNOSIS — M1990 Unspecified osteoarthritis, unspecified site: Secondary | ICD-10-CM | POA: Diagnosis not present

## 2015-04-02 DIAGNOSIS — I481 Persistent atrial fibrillation: Secondary | ICD-10-CM | POA: Insufficient documentation

## 2015-04-02 DIAGNOSIS — Z8551 Personal history of malignant neoplasm of bladder: Secondary | ICD-10-CM | POA: Insufficient documentation

## 2015-04-02 DIAGNOSIS — Z974 Presence of external hearing-aid: Secondary | ICD-10-CM | POA: Insufficient documentation

## 2015-04-02 DIAGNOSIS — S0990XA Unspecified injury of head, initial encounter: Secondary | ICD-10-CM

## 2015-04-02 DIAGNOSIS — Y9289 Other specified places as the place of occurrence of the external cause: Secondary | ICD-10-CM | POA: Diagnosis not present

## 2015-04-02 DIAGNOSIS — I739 Peripheral vascular disease, unspecified: Secondary | ICD-10-CM | POA: Insufficient documentation

## 2015-04-02 DIAGNOSIS — S80212A Abrasion, left knee, initial encounter: Secondary | ICD-10-CM

## 2015-04-02 DIAGNOSIS — S0081XA Abrasion of other part of head, initial encounter: Secondary | ICD-10-CM | POA: Insufficient documentation

## 2015-04-02 DIAGNOSIS — Z7982 Long term (current) use of aspirin: Secondary | ICD-10-CM | POA: Diagnosis not present

## 2015-04-02 DIAGNOSIS — Y9389 Activity, other specified: Secondary | ICD-10-CM | POA: Diagnosis not present

## 2015-04-02 DIAGNOSIS — R011 Cardiac murmur, unspecified: Secondary | ICD-10-CM | POA: Insufficient documentation

## 2015-04-02 DIAGNOSIS — N186 End stage renal disease: Secondary | ICD-10-CM | POA: Insufficient documentation

## 2015-04-02 DIAGNOSIS — Z7901 Long term (current) use of anticoagulants: Secondary | ICD-10-CM | POA: Diagnosis not present

## 2015-04-02 DIAGNOSIS — Z8701 Personal history of pneumonia (recurrent): Secondary | ICD-10-CM | POA: Diagnosis not present

## 2015-04-02 DIAGNOSIS — I509 Heart failure, unspecified: Secondary | ICD-10-CM | POA: Insufficient documentation

## 2015-04-02 DIAGNOSIS — Y998 Other external cause status: Secondary | ICD-10-CM | POA: Insufficient documentation

## 2015-04-02 DIAGNOSIS — I2510 Atherosclerotic heart disease of native coronary artery without angina pectoris: Secondary | ICD-10-CM | POA: Insufficient documentation

## 2015-04-02 DIAGNOSIS — Z9889 Other specified postprocedural states: Secondary | ICD-10-CM | POA: Insufficient documentation

## 2015-04-02 DIAGNOSIS — W1830XA Fall on same level, unspecified, initial encounter: Secondary | ICD-10-CM | POA: Diagnosis not present

## 2015-04-02 DIAGNOSIS — S0091XA Abrasion of unspecified part of head, initial encounter: Secondary | ICD-10-CM

## 2015-04-02 DIAGNOSIS — Z972 Presence of dental prosthetic device (complete) (partial): Secondary | ICD-10-CM | POA: Diagnosis not present

## 2015-04-02 DIAGNOSIS — I120 Hypertensive chronic kidney disease with stage 5 chronic kidney disease or end stage renal disease: Secondary | ICD-10-CM | POA: Diagnosis not present

## 2015-04-02 NOTE — ED Notes (Signed)
Pt here for trip and fall at dialysis today. sts his toe was caught on a rug. Pt here with left knee pain and head pain. Dialysis was worried due to pt being on blood thinners.

## 2015-04-02 NOTE — ED Notes (Signed)
Applied bacitracin to pts left knee per Dr. Chauncey Reading request. Wrapped with 4x4 and gauze. Good pedal pulses felt with pt in NAD and position of comfort. Back up supply given to family per request.

## 2015-04-02 NOTE — Discharge Instructions (Signed)
Abrasion An abrasion is a cut or scrape on the outer surface of your skin. An abrasion does not extend through all of the layers of your skin. It is important to care for your abrasion properly to prevent infection. CAUSES Most abrasions are caused by falling on or gliding across the ground or another surface. When your skin rubs on something, the outer and inner layer of skin rubs off.  SYMPTOMS A cut or scrape is the main symptom of this condition. The scrape may be bleeding, or it may appear red or pink. If there was an associated fall, there may be an underlying bruise. DIAGNOSIS An abrasion is diagnosed with a physical exam. TREATMENT Treatment for this condition depends on how large and deep the abrasion is. Usually, your abrasion will be cleaned with water and mild soap. This removes any dirt or debris that may be stuck. An antibiotic ointment may be applied to the abrasion to help prevent infection. A bandage (dressing) may be placed on the abrasion to keep it clean. You may also need a tetanus shot. HOME CARE INSTRUCTIONS Medicines  Take or apply medicines only as directed by your health care provider.  If you were prescribed an antibiotic ointment, finish all of it even if you start to feel better. Wound Care  Clean the wound with mild soap and water 2-3 times per day or as directed by your health care provider. Pat your wound dry with a clean towel. Do not rub it.  There are many different ways to close and cover a wound. Follow instructions from your health care provider about:  Wound care.  Dressing changes and removal.  Check your wound every day for signs of infection. Watch for:  Redness, swelling, or pain.  Fluid, blood, or pus. General Instructions  Keep the dressing dry as directed by your health care provider. Do not take baths, swim, use a hot tub, or do anything that would put your wound underwater until your health care provider approves.  If there is  swelling, raise (elevate) the injured area above the level of your heart while you are sitting or lying down.  Keep all follow-up visits as directed by your health care provider. This is important. SEEK MEDICAL CARE IF:  You received a tetanus shot and you have swelling, severe pain, redness, or bleeding at the injection site.  Your pain is not controlled with medicine.  You have increased redness, swelling, or pain at the site of your wound. SEEK IMMEDIATE MEDICAL CARE IF:  You have a red streak going away from your wound.  You have a fever.  You have fluid, blood, or pus coming from your wound.  You notice a bad smell coming from your wound or your dressing.   This information is not intended to replace advice given to you by your health care provider. Make sure you discuss any questions you have with your health care provider.   Document Released: 01/25/2005 Document Revised: 01/06/2015 Document Reviewed: 04/15/2014 Elsevier Interactive Patient Education 2016 [place] Injury, Adult You have received a head injury. It does not appear serious at this time. Headaches and vomiting are common following head injury. It should be easy to awaken from sleeping. Sometimes it is necessary for you to stay in the emergency department for a while for observation. Sometimes admission to the hospital may be needed. After injuries such as yours, most problems occur within the first 24 hours, but side effects may occur up  to 7-10 days after the injury. It is important for you to carefully monitor your condition and contact your health care provider or seek immediate medical care if there is a change in your condition. WHAT ARE THE TYPES OF HEAD INJURIES? Head injuries can be as minor as a bump. Some head injuries can be more severe. More severe head injuries include:  A jarring injury to the brain (concussion).  A bruise of the brain (contusion). This mean there is bleeding in the brain  that can cause swelling.  A cracked skull (skull fracture).  Bleeding in the brain that collects, clots, and forms a bump (hematoma). WHAT CAUSES A HEAD INJURY? A serious head injury is most likely to happen to someone who is in a car wreck and is not wearing a seat belt. Other causes of major head injuries include bicycle or motorcycle accidents, sports injuries, and falls. HOW ARE HEAD INJURIES DIAGNOSED? A complete history of the event leading to the injury and your current symptoms will be helpful in diagnosing head injuries. Many times, pictures of the brain, such as CT or MRI are needed to see the extent of the injury. Often, an overnight hospital stay is necessary for observation.  WHEN SHOULD I SEEK IMMEDIATE MEDICAL CARE?  You should get help right away if:  You have confusion or drowsiness.  You feel sick to your stomach (nauseous) or have continued, forceful vomiting.  You have dizziness or unsteadiness that is getting worse.  You have severe, continued headaches not relieved by medicine. Only take over-the-counter or prescription medicines for pain, fever, or discomfort as directed by your health care provider.  You do not have normal function of the arms or legs or are unable to walk.  You notice changes in the black spots in the center of the colored part of your eye (pupil).  You have a clear or bloody fluid coming from your nose or ears.  You have a loss of vision. During the next 24 hours after the injury, you must stay with someone who can watch you for the warning signs. This person should contact local emergency services (911 in the [place] if you have seizures, you become unconscious, or you are unable to wake up. HOW CAN I PREVENT A HEAD INJURY IN THE FUTURE? The most important factor for preventing major head injuries is avoiding motor vehicle accidents. To minimize the potential for damage to your head, it is crucial to wear seat belts while riding in motor  vehicles. Wearing helmets while bike riding and playing collision sports (like football) is also helpful. Also, avoiding dangerous activities around the house will further help reduce your risk of head injury.  WHEN CAN I RETURN TO NORMAL ACTIVITIES AND ATHLETICS? You should be reevaluated by your health care provider before returning to these activities. If you have any of the following symptoms, you should not return to activities or contact sports until 1 week after the symptoms have stopped:  Persistent headache.  Dizziness or vertigo.  Poor attention and concentration.  Confusion.  Memory problems.  Nausea or vomiting.  Fatigue or tire easily.  Irritability.  Intolerant of bright lights or loud noises.  Anxiety or depression.  Disturbed sleep. MAKE SURE YOU:   Understand these instructions.  Will watch your condition.  Will get help right away if you are not doing well or get worse.   This information is not intended to replace advice given to you by your health care provider. Make  sure you discuss any questions you have with your health care provider.   Document Released: 04/17/2005 Document Revised: 05/08/2014 Document Reviewed: 12/23/2012 Elsevier Interactive Patient Education [employer].

## 2015-04-02 NOTE — ED Provider Notes (Signed)
CSN: [id number]     Arrival date & time 04/02/15  1740 History   First MD Initiated Contact with Patient 04/02/15 1913     Chief Complaint  Patient presents with  . Fall  . Head Injury     (Consider location/radiation/quality/duration/timing/severity/associated sxs/prior Treatment) HPI Comments: Patient here after mechanical fall while at dialysis. Did not strike his head but does have a headache. Denies any neck pain. Also struck his left knee but has been able to ambulate. Denies any symptoms prior to the fall. No abdominal or chest discomfort this time. He does take Coumadin and was sent here for head CT. He has been has baseline state of health today. No confusion. No vomiting noted.  Patient is a 74 y.o. male presenting with fall and head injury. The history is provided by the patient.  Fall  Head Injury   Past Medical History  Diagnosis Date  . Atrial fibrillation, persistent ([place])     a. cardioversion 11/21/10 . b. 05/2013 - experiencing more SOB. 24-hr holter showed avg HR 49, max 82, min 38bpm. ETT showed baseline junctional bradycardia, HR incr only to 90bpm with drop in BP. Saw EP - amiodarone reduced and eventually d/c'd with improvement in dyspnea.  . Hypertension   . Warfarin anticoagulation   . Aortic valve stenosis   . Orthostatic hypotension   . Diastolic dysfunction   . Arthritis   . Self-catheterizes urinary bladder   . Junctional bradycardia   . NSVT (nonsustained ventricular tachycardia) ([place])     a. By holter 01/2014.  [name] Shortness of breath dyspnea   . PVD (peripheral vascular disease) ([place])   . Cardiomyopathy ([place])     non-ischemic  . Hypomagnesemia   . A-V fistula ([place])   . CAD (coronary artery disease) 12/16/14    a. cath - nonobstrucive CAD 30% pro RCA; 25% dis RCA; 40T pro Lcx  . Heart murmur   . Dysrhythmia   . CHF (congestive heart failure) ([place])   . Full dentures   . Uses hearing aid   . Chronic diarrhea     takes imodium BID  . Pneumonia     hx of  . Hemodialysis patient [facility])     M,W,F - dialysis initiated August 2016  . Bruises easily   . Bladder cancer ([place])     a. 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  . 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 CKD (chronic kidney disease), stage V ([place])     a. Followed by Dr. Jimmy Footman. b. IV fistula placed 04/17/14. c. initiation of HD August 2016  . RTA (renal tubular acidosis)   . ESRD (end stage renal disease) on dialysis [facility])     "MWF; [name]" (03/05/2015)  . Paravalvular leak of prosthetic heart valve    Past Surgical History  Procedure Laterality Date  . Cardioversion  11/21/2010  . Cystectomy    . Cholecystectomy    . Cataract extraction w/ intraocular lens  implant, bilateral    . Colonoscopy    . Av fistula placement Left 04/17/2014    Procedure: ARTERIOVENOUS (AV) FISTULA CREATION- LEFT UPPER ARM ;  Surgeon: Mal [name], MD;  Location: [place];  Service: Vascular;  Laterality: Left;  . Bladder surgery      radical cystectomy  . Vascular surgery    . Multiple tooth extractions    .  Cardiac catheterization N/A 12/16/2014    Procedure: Right/Left Heart Cath and Coronary Angiography;  Surgeon: [name], MD;  Location: [place] CV LAB;  Service: Cardiovascular;  Laterality: N/A;  . Eye surgery    . Colon surgery  Nov 2002    reconstructed bladder from colon. per pt. at [facility]  . Tee without cardioversion N/A 01/01/2015    Procedure: TRANSESOPHAGEAL ECHOCARDIOGRAM (TEE);  Surgeon: [name], MD;  Location: [facility] ENDOSCOPY;  Service: Cardiovascular;  Laterality: N/A;  . Transcatheter aortic valve replacement, transapical N/A 02/16/2015    Procedure: TRANSCATHETER AORTIC VALVE REPLACEMENT, TRANSAPICAL;  Surgeon: [name], MD;  Location: [place];  Service: Open Heart Surgery;  Laterality: N/A;  . Tee without cardioversion N/A  02/16/2015    Procedure: TRANSESOPHAGEAL ECHOCARDIOGRAM (TEE);  Surgeon: [name], MD;  Location: [place];  Service: Open Heart Surgery;  Laterality: N/A;   Family History  Problem Relation Age of Onset  . CVA Mother   . Heart attack Father   . Heart disease Father   . Arrhythmia Father   . Alzheimer's disease Sister   . Multiple sclerosis Brother   . Alcohol abuse Father    Social History  Substance Use Topics  . Smoking status: Former Smoker -- 2.00 packs/day for 33 years    Types: Cigarettes    Quit date: 05/02/1983  . Smokeless tobacco: Never Used  . Alcohol Use: Yes     Comment: rare    Review of Systems  All other systems reviewed and are negative.     Allergies  Amiodarone and Oxycodone hcl  Home Medications   Prior to Admission medications   Medication Sig Start Date End Date Taking? Authorizing Provider  acetaminophen (TYLENOL) 500 MG tablet Take 1,000 mg by mouth every 6 (six) hours as needed for mild pain or moderate pain.    Historical Provider, MD  aspirin 81 MG tablet Take 81 mg by mouth daily.    Historical Provider, MD  B Complex-C-Folic Acid (RENAL VITAMIN PO) Take 1 tablet by mouth daily.    Historical Provider, MD  diltiazem (DILTIAZEM CD) 180 MG 24 hr capsule Take 1 capsule (180 mg total) by mouth daily. 03/24/15   Will Meredith Leeds, MD  Loperamide HCl (IMODIUM PO) Take 1 tablet by mouth 2 (two) times daily.    Historical Provider, MD  metoprolol tartrate (LOPRESSOR) 25 MG tablet Take 2 tablets (50 mg total) by mouth every morning.  Take 1 tablet (25 mg total) by mouth every evening. 03/24/15   Will Meredith Leeds, MD  Multiple Vitamins-Minerals (MULTIVITAMIN WITH MINERALS) tablet Take 1 tablet by mouth daily.    Historical Provider, MD  Pancrelipase, Lip-Prot-Amyl, (CREON) 24000 UNITS CPEP Take 48,000 Units by mouth 3 (three) times daily with meals.     Historical Provider, MD  sevelamer carbonate (RENVELA) 800 MG tablet 1,600 mg 3 (three) times  daily with meals.     Historical Provider, MD  warfarin (COUMADIN) 2 MG tablet Take 2 mg by mouth daily. OR AS DIRECTED    Historical Provider, MD   BP 112/75 mmHg  Pulse 78  Temp(Src) 97.9 F (36.6 C) (Oral)  Resp 16  SpO2 100% Physical Exam  Constitutional: He is oriented to person, place, and time. He appears well-developed and well-nourished.  Non-toxic appearance. No distress.  HENT:  Head: Normocephalic and atraumatic.    Eyes: Conjunctivae, EOM and lids are normal. Pupils are equal, round, and reactive to light.  Neck: Normal  range of motion. Neck supple. No spinous process tenderness and no muscular tenderness present. No tracheal deviation present. No thyroid mass present.  Cardiovascular: Normal rate, regular rhythm and normal heart sounds.  Exam reveals no gallop.   No murmur heard. Pulmonary/Chest: Effort normal and breath sounds normal. No stridor. No respiratory distress. He has no decreased breath sounds. He has no wheezes. He has no rhonchi. He has no rales.  Abdominal: Soft. Normal appearance and bowel sounds are normal. He exhibits no distension. There is no tenderness. There is no rebound and no CVA tenderness.  Musculoskeletal: Normal range of motion. He exhibits no edema or tenderness.       Legs: Neurological: He is alert and oriented to person, place, and time. He has normal strength. No cranial nerve deficit or sensory deficit. GCS eye subscore is 4. GCS verbal subscore is 5. GCS motor subscore is 6.  Skin: Skin is warm and dry. No abrasion and no rash noted.  Psychiatric: He has a normal mood and affect. His speech is normal and behavior is normal.  Nursing note and vitals reviewed.   ED Course  Procedures (including critical care time) Labs Review Labs Reviewed - No data to display  Imaging Review Ct Head Wo Contrast  04/02/2015  CLINICAL DATA:  74 year old male with fall and bruise on the forehead. EXAM: CT HEAD WITHOUT CONTRAST TECHNIQUE: Contiguous  axial images were obtained from the base of the skull through the vertex without intravenous contrast. COMPARISON:  None. FINDINGS: There is slight prominence of the ventricles and sulci compatible with age-related volume loss. Mild periventricular and deep white matter hypodensities represent chronic microvascular ischemic changes. 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 There is no intracranial hemorrhage. No mass effect or midline shift identified. The visualized paranasal sinuses and mastoid air cells are well aerated. The calvarium is intact. IMPRESSION: No acute intracranial pathology. Mild age-related atrophy and chronic microvascular ischemic disease. Electronically Signed   By: Anner Crete M.D.   On: 04/02/2015 18:38   I have personally reviewed and evaluated these images and lab results as part of my medical decision-making.   EKG Interpretation None      MDM   Final diagnoses:  None    Patient's head CT without acute findings at this time. Left knee has full range of motion and no deformities. Does have an abrasion noted. I do not fill that he needs imaging at this time. Wound dressed by nursing. Return precautions given  Lacretia Leigh, MD 04/02/15 [phone]

## 2015-04-05 ENCOUNTER — Ambulatory Visit: Payer: Self-pay | Admitting: Thoracic Surgery (Cardiothoracic Vascular Surgery)

## 2015-04-06 ENCOUNTER — Ambulatory Visit (INDEPENDENT_AMBULATORY_CARE_PROVIDER_SITE_OTHER): Payer: MEDICARE | Admitting: *Deleted

## 2015-04-06 ENCOUNTER — Ambulatory Visit: Payer: Self-pay | Admitting: Physician Assistant

## 2015-04-06 DIAGNOSIS — I4891 Unspecified atrial fibrillation: Secondary | ICD-10-CM | POA: Diagnosis not present

## 2015-04-06 LAB — POCT INR: INR: 2

## 2015-04-13 ENCOUNTER — Encounter: Payer: Self-pay | Admitting: Podiatry

## 2015-04-13 ENCOUNTER — Ambulatory Visit (INDEPENDENT_AMBULATORY_CARE_PROVIDER_SITE_OTHER): Payer: MEDICARE | Admitting: Podiatry

## 2015-04-13 VITALS — BP 96/54 | HR 55 | Resp 18

## 2015-04-13 DIAGNOSIS — L97501 Non-pressure chronic ulcer of other part of unspecified foot limited to breakdown of skin: Secondary | ICD-10-CM

## 2015-04-13 DIAGNOSIS — I2510 Atherosclerotic heart disease of native coronary artery without angina pectoris: Secondary | ICD-10-CM | POA: Diagnosis not present

## 2015-04-13 MED ORDER — SILVER SULFADIAZINE 1 % EX CREA: 1.0000 "application " | TOPICAL_CREAM | Freq: Every day | CUTANEOUS | Status: DC

## 2015-04-13 NOTE — Progress Notes (Signed)
   Subjective:    Patient ID: [name], male    DOB: 29-May-1940, 74 y.o.   MRN: [id number]  HPI   74 year old male presents the office today with concerns of wounds to both of his feet which have been ongoing for the last several weeks. He states that the wound started after he had rubbing from his sock inside of his shoes in the form blisters. He did undergo around of cephalexin and then a dialysis he was been treated with antibiotics as well. He just finished his course of antibiotics today at dialysis. He does sit there is been cleared of bloody drainage coming from the wounds but denies any pus. She does that he has poor circulation to his feet.  They have been using over-the-counter Silver cream. He denies any systemic complaints such as fevers, chills, nausea, vomiting. No calf pain, chest pain, shortness of breath.   Review of Systems  HENT: Positive for hearing loss.        Objective:   Physical Exam General: AAO x3, NAD  Dermatological:  On both the left and right foot there are ulcerations overlying the toes. On the right foot on the dorsal aspect of the hallux, second/third digit and along then medial plantar hallux is an area of fibrotic, eschar tissue. On the left foot in between the hallux and second toe and the second and third toe and the distal hallux and dorsal hallux again a small areas of fibrotic, eshcar.  There is no drainage or purulence expressed. The wounds appear to be superficial there is no bogginess. No areas of fluxions or crepitus. No malodor. There does appear to be some dry, xerotic, erythematous skin to the dorsal aspect of the feet as well , likely tinea pedis.  Vascular:  DP/PT pulses decrease, CRT delayed.  Pedal hair absent.  Neruologic: Sensation decreased with Derrel Nip monofilament,  Decreased vibratory sensation  Musculoskeletal: No gross boney pedal deformities bilateral. No pain, crepitus, or limitation noted with foot and ankle range of  motion bilateral. Muscular strength 5/5 in all groups tested bilateral.  Gait: Unassisted, Nonantalgic.      Assessment & Plan:   74 year old male with bilateral ulcerations likely due from previous blister from friction -Treatment options discussed including all alternatives, risks, and complications -Etiology of symptoms were discussed - At this time he is completed 2 rounds of antibiotics and the wounds noted. The infected so therefore we will hold off on antibiotics at this time. If he has any signs or symptoms of infection to call the office immediately.  Discussed the wounds can become a limb threatening infection. -Recommend Silvadene to the wounds daily. - Will order arterial studies as well as consult with Dr. Gwenlyn Found.  -Monitor for any clinical signs or symptoms of infection and directed to call the office immediately should any occur or go to the ER. --Follow-up in 1 week or sooner if any problems arise. In the meantime, encouraged to call the office with any questions, concerns, change in symptoms.   Celesta Gentile, DPM

## 2015-04-14 ENCOUNTER — Telehealth: Payer: Self-pay | Admitting: *Deleted

## 2015-04-14 DIAGNOSIS — R0989 Other specified symptoms and signs involving the circulatory and respiratory systems: Secondary | ICD-10-CM

## 2015-04-14 NOTE — Telephone Encounter (Signed)
-----   Message from Trula Slade, DPM sent at 04/13/2015  8:25 PM EST ----- He needs arterial studies and can you go ahead and schedule a consult with Dr. Gwenlyn Found for decreased pulses and wounds to his fete. Thanks

## 2015-04-15 ENCOUNTER — Encounter: Payer: Self-pay | Admitting: Podiatry

## 2015-04-15 DIAGNOSIS — L97509 Non-pressure chronic ulcer of other part of unspecified foot with unspecified severity: Secondary | ICD-10-CM | POA: Insufficient documentation

## 2015-04-20 ENCOUNTER — Ambulatory Visit (INDEPENDENT_AMBULATORY_CARE_PROVIDER_SITE_OTHER): Payer: MEDICARE | Admitting: *Deleted

## 2015-04-20 DIAGNOSIS — I4891 Unspecified atrial fibrillation: Secondary | ICD-10-CM

## 2015-04-20 LAB — POCT INR: INR: 6

## 2015-04-20 LAB — PROTIME-INR
INR: 4.18 — ABNORMAL HIGH (ref <1.50)
Prothrombin Time: 41 seconds — ABNORMAL HIGH (ref 11.6–15.2)

## 2015-04-22 ENCOUNTER — Other Ambulatory Visit: Payer: Self-pay | Admitting: Podiatry

## 2015-04-22 ENCOUNTER — Ambulatory Visit (HOSPITAL_COMMUNITY)
Admission: RE | Admit: 2015-04-22 | Discharge: 2015-04-22 | Disposition: A | Discharge status: Home / Self Care | Payer: MEDICARE | Source: Ambulatory Visit | Attending: Podiatry | Admitting: Podiatry

## 2015-04-22 DIAGNOSIS — I739 Peripheral vascular disease, unspecified: Secondary | ICD-10-CM

## 2015-04-22 DIAGNOSIS — I120 Hypertensive chronic kidney disease with stage 5 chronic kidney disease or end stage renal disease: Secondary | ICD-10-CM | POA: Insufficient documentation

## 2015-04-22 DIAGNOSIS — R938 Abnormal findings on diagnostic imaging of other specified body structures: Secondary | ICD-10-CM | POA: Diagnosis not present

## 2015-04-22 DIAGNOSIS — I70202 Unspecified atherosclerosis of native arteries of extremities, left leg: Secondary | ICD-10-CM | POA: Diagnosis not present

## 2015-04-22 DIAGNOSIS — R0989 Other specified symptoms and signs involving the circulatory and respiratory systems: Secondary | ICD-10-CM | POA: Diagnosis present

## 2015-04-22 DIAGNOSIS — I771 Stricture of artery: Secondary | ICD-10-CM | POA: Diagnosis not present

## 2015-04-22 DIAGNOSIS — N186 End stage renal disease: Secondary | ICD-10-CM | POA: Diagnosis not present

## 2015-04-22 DIAGNOSIS — Z992 Dependence on renal dialysis: Secondary | ICD-10-CM | POA: Insufficient documentation

## 2015-04-27 ENCOUNTER — Ambulatory Visit (INDEPENDENT_AMBULATORY_CARE_PROVIDER_SITE_OTHER): Payer: MEDICARE | Admitting: Podiatry

## 2015-04-27 ENCOUNTER — Encounter: Payer: Self-pay | Admitting: Podiatry

## 2015-04-27 ENCOUNTER — Encounter: Payer: Self-pay | Admitting: Cardiovascular Disease

## 2015-04-27 ENCOUNTER — Ambulatory Visit (INDEPENDENT_AMBULATORY_CARE_PROVIDER_SITE_OTHER): Payer: MEDICARE | Admitting: Cardiovascular Disease

## 2015-04-27 VITALS — BP 90/54 | HR 54 | Ht 68.0 in | Wt 152.0 lb

## 2015-04-27 DIAGNOSIS — L97501 Non-pressure chronic ulcer of other part of unspecified foot limited to breakdown of skin: Secondary | ICD-10-CM | POA: Diagnosis not present

## 2015-04-27 DIAGNOSIS — I2510 Atherosclerotic heart disease of native coronary artery without angina pectoris: Secondary | ICD-10-CM

## 2015-04-27 DIAGNOSIS — I739 Peripheral vascular disease, unspecified: Secondary | ICD-10-CM | POA: Diagnosis not present

## 2015-04-27 NOTE — Patient Instructions (Signed)
Medication Instructions:  Your physician recommends that you continue on your current medications as directed. Please refer to the Current Medication list given to you today.   Labwork: none  Testing/Procedures: none  Follow-Up: Your physician wants you to follow-up in: 3 months with Dr. Gwenlyn Found. You will receive a reminder letter in the mail two months in advance. If you don't receive a letter, please call our office to schedule the follow-up appointment.   Any Other Special Instructions Will Be Listed Below (If Applicable).     If you need a refill on your cardiac medications before your next appointment, please call your pharmacy.

## 2015-04-27 NOTE — Progress Notes (Signed)
04/27/2015 [name]   December 25, 1940  [id number]  Primary Physician [name] Pel, MD Primary Cardiologist: [name] Harp MD [name]   HPI:  [name] is a 74 year old moderately overweight married Caucasian male comes in by his wife [name] today. He is referred by [name], podiatry, for peripheral vascular evaluation. hhe has a history of valvular heart disease status post apical TA VR in October of this year by [name] and [name] . His other problems include chronic renal insufficiency on hemodialysis since August, chronic A. Fib on Coumadin and a coagulation, COPD and hypertension. He has had nonhealing wounds on his right first and second toes which are currently wrapped with less severe wounds on his left first and second toes. His Dopplers not office on 04/22/15 revealed a right TBI 0.55 and a left ABI 0.42. His ABIs were not obtainable because of noncompressible vessels. He did not have large vessel disease on the right. He had an occluded posterior tibial. He does have a high-grade left popliteal artery stenosis with occluded left posterior tibial and peroneal artery. He denies claudication.   Current Outpatient Prescriptions  Medication Sig Dispense Refill  . acetaminophen (TYLENOL) 500 MG tablet Take 1,000 mg by mouth every 6 (six) hours as needed for mild pain or moderate pain.    [name] aspirin 81 MG tablet Take 81 mg by mouth daily.    . B Complex-C-Folic Acid (RENAL VITAMIN PO) Take 1 tablet by mouth daily.    [name] diltiazem (DILTIAZEM CD) 180 MG 24 hr capsule Take 1 capsule (180 mg total) by mouth daily. 90 capsule 1  . Loperamide HCl (IMODIUM PO) Take 1 tablet by mouth 2 (two) times daily.    . metoprolol tartrate (LOPRESSOR) 25 MG tablet Take 2 tablets (50 mg total) by mouth every morning.  Take 1 tablet (25 mg total) by mouth every evening. (Patient taking differently: Take 75 mg by mouth 2 (two) times daily. TAKE 25 MG IN THE MORNING AND TAKE  50 MG IN THE EVENING.) 90 tablet 4  . metoprolol tartrate (LOPRESSOR) 25 MG tablet Take 25 mg by mouth See admin instructions. Take 50mg  in the AM, then take 25mg  at night    . Multiple Vitamins-Minerals (MULTIVITAMIN WITH MINERALS) tablet Take 1 tablet by mouth daily.    . sevelamer carbonate (RENVELA) 800 MG tablet 1,600 mg 3 (three) times daily with meals.     . silver sulfADIAZINE (SILVADENE) 1 % cream Apply 1 application topically daily. 50 g 0  . warfarin (COUMADIN) 2 MG tablet Take 5 mg by mouth daily. TAKE 1/2 TABLET OR AS DIRECTED     No current facility-administered medications for this visit.    Allergies  Allergen Reactions  . Amiodarone Other (See Comments)    Severely reduced DLCO  . Oxycodone Hcl Rash and Other (See Comments)    Rash and itching    Social History   Social History  . Marital Status: Married    Spouse Name: N/A  . Number of Children: N/A  . Years of Education: N/A   Occupational History  . Not on file.   Social History Main Topics  . Smoking status: Former Smoker -- 2.00 packs/day for 33 years    Types: Cigarettes    Quit date: 05/02/1983  . Smokeless tobacco: Never Used  . Alcohol Use: Yes     Comment: rare  . Drug Use: No  . Sexual Activity: Not on file  Other Topics Concern  . Not on file   Social History Narrative     Review of Systems: General: negative for chills, fever, night sweats or weight changes.  Cardiovascular: negative for chest pain, dyspnea on exertion, edema, orthopnea, palpitations, paroxysmal nocturnal dyspnea or shortness of breath Dermatological: negative for rash Respiratory: negative for cough or wheezing Urologic: negative for hematuria Abdominal: negative for nausea, vomiting, diarrhea, bright red blood per rectum, melena, or hematemesis Neurologic: negative for visual changes, syncope, or dizziness All other systems reviewed and are otherwise negative except as noted above.    Blood pressure 90/54,  pulse 54, height 5\' 8"  (1.727 m), weight 152 lb (68.947 kg).  General appearance: alert and no distress Neck: no adenopathy, no carotid bruit, no JVD, supple, symmetrical, trachea midline and thyroid not enlarged, symmetric, no tenderness/mass/nodules Lungs: clear to auscultation bilaterally Heart: irregularly irregular rhythm and 0000000 diastolic murmur heard best at the left lateral sternal border consistent with aortic insufficiency Extremities: 2+ pitting edema bilaterally.  EKG not performed today  ASSESSMENT AND PLAN:   Peripheral arterial disease [facility]) [name]. [place] was referred to me by [name], podiatry, for evaluation of peripheral arterial disease and critical limb ischemia. He has multiple medical problems including recent TAVR, hypertension, chronic A. Fib on Coumadin, chronic renal insufficiency on hemodialysis, diastolic dysfunction with nonhealing ulcers on his right first and second toe. These are more severe than on his left foot. He denies claudication. He did have Doppler studies performed in the office on 04/22/15 revealed a right TBI of 0.55 and a left ABI of 0.4 to. His ABIs were not obtainable because of noncompressible vessels. Duplex imaging revealed an occluded right posterior tibial artery but otherwise no systemic and large vessel disease. He had a high-grade left popliteal artery stenosis and occluded posterior tibial and peroneal arteries. At this point, I do not think intervention is appropriate. Recommend aggressive local care. I would reserve intervention for progression of his wounds. This will require interruption of his Coumadin anticoagulation. I will see him back in 3 months for follow-up.      [name] Harp MD FACP,FACC,FAHA, [facility] 04/27/2015 10:04 AM

## 2015-04-27 NOTE — Assessment & Plan Note (Addendum)
Mr. Downham was referred to me by Dr. Jacqualyn Posey, podiatry, for evaluation of peripheral arterial disease and critical limb ischemia. He has multiple medical problems including recent TAVR, hypertension, chronic A. Fib on Coumadin, chronic renal insufficiency on hemodialysis, diastolic dysfunction with nonhealing ulcers on his right first and second toe. These are more severe than on his left foot. He denies claudication. He did have Doppler studies performed in the office on 04/22/15 revealed a right TBI of 0.55 and a left ABI of 0.4 to. His ABIs were not obtainable because of noncompressible vessels. Duplex imaging revealed an occluded right posterior tibial artery but otherwise no systemic and large vessel disease. He had a high-grade left popliteal artery stenosis and occluded posterior tibial and peroneal arteries. At this point, I do not think intervention is appropriate. Recommend aggressive local care. I would reserve intervention for progression of his wounds. This will require interruption of his Coumadin anticoagulation. I will see him back in 3 months for follow-up.

## 2015-04-29 ENCOUNTER — Telehealth: Payer: Self-pay | Admitting: Cardiology

## 2015-04-29 NOTE — Telephone Encounter (Signed)
Patient called to say he is not doing well and his wife wanted him to look into Palliative Care.  Gave the patient condolences, and told him to contact his PCP for recommendations. Patient is grateful for call and instruction.

## 2015-04-29 NOTE — Progress Notes (Signed)
  Patient ID: [name], male    DOB: 1941-01-26, 74 y.o.   MRN: [id number]  Subjective: Patient presents the office if up evaluation of wounds to both of his feet which is been ongoing for the last several weeks. He did follow up with Dr. Gwenlyn Found this morning however no intervention will be performed at this time. He does continue with Silvadene dressing changes daily. They deny any surrounding redness or red streaks at this time. He has completed his course of antibiotics. He denies any systemic complaints such as fevers, chills, nausea, vomiting. No calf pain, chest pain, shortness of breath.   Objective:  General: AAO x3, NAD  Dermatological:  On both the left and right foot there are ulcerations overlying the toes. On the right foot on the dorsal aspect of the hallux, second/third digit and along then medial plantar hallux is an area of fibrotic, eschar tissue. The eschar appears to have fallen off on the dorsal right first toe and is fibrotic wound present. On the left foot in between the hallux and second toe and the second and third toe and the distal hallux and dorsal hallux again a small areas of fibrotic, eshcar.  There is no drainage or purulence expressed. The wounds appear to be superficial there is no bogginess. There is cracking of the heels which has turned into a wound on the right side. No areas of fluctuation or crepitus. No malodor. There is no probing of any the wounds were undermining or tunneling. There is trace edema to the foot without any erythema or increase in warmth.   Vascular:  DP/PT pulses decreased, CRT delayed.  Pedal hair absent.  Neruologic: Sensation decreased with Derrel Nip monofilament,  Decreased vibratory sensation  Musculoskeletal: No gross boney pedal deformities bilateral. No pain, crepitus, or limitation noted with foot and ankle range of motion bilateral. Muscular strength 5/5 in all groups tested bilateral.  Gait: Unassisted, Nonantalgic.    Assessment: 74 year old male with bilateral ulcerations likely due from previous blister from friction  Plan: -Treatment options discussed including all alternatives, risks, and complications -Due to the continuation with without much improvement I do recommend a wound care consult. An order was placed today. -Continue with Silvadene dressing changes daily. -Monitor for any clinical signs or symptoms of infection and directed to call the office immediately should any occur or go to the ER. -Follow-up in 2 weeks or sooner if any problems arise. If he can and the wound care center at that time recommend wound care evaluation.  In the meantime, encouraged to call the office with any questions, concerns, change in symptoms.   Celesta Gentile, DPM

## 2015-04-29 NOTE — Telephone Encounter (Signed)
New Message  Pt calling to speak w/ RN concerning palliative care.Please call back and discuss.

## 2015-04-30 ENCOUNTER — Ambulatory Visit (INDEPENDENT_AMBULATORY_CARE_PROVIDER_SITE_OTHER): Payer: MEDICARE | Admitting: *Deleted

## 2015-04-30 DIAGNOSIS — I4891 Unspecified atrial fibrillation: Secondary | ICD-10-CM | POA: Diagnosis not present

## 2015-04-30 LAB — POCT INR: INR: 5.1

## 2015-04-30 MED ORDER — WARFARIN SODIUM 2.5 MG PO TABS: ORAL_TABLET | ORAL | Status: AC

## 2015-05-07 ENCOUNTER — Encounter: Payer: MEDICARE | Attending: Surgery | Admitting: Surgery

## 2015-05-07 DIAGNOSIS — Z8551 Personal history of malignant neoplasm of bladder: Secondary | ICD-10-CM | POA: Diagnosis not present

## 2015-05-07 DIAGNOSIS — N186 End stage renal disease: Secondary | ICD-10-CM | POA: Insufficient documentation

## 2015-05-07 DIAGNOSIS — I739 Peripheral vascular disease, unspecified: Secondary | ICD-10-CM | POA: Insufficient documentation

## 2015-05-07 DIAGNOSIS — I70245 Atherosclerosis of native arteries of left leg with ulceration of other part of foot: Secondary | ICD-10-CM | POA: Diagnosis not present

## 2015-05-07 DIAGNOSIS — I10 Essential (primary) hypertension: Secondary | ICD-10-CM | POA: Insufficient documentation

## 2015-05-07 DIAGNOSIS — I120 Hypertensive chronic kidney disease with stage 5 chronic kidney disease or end stage renal disease: Secondary | ICD-10-CM | POA: Diagnosis not present

## 2015-05-07 DIAGNOSIS — I70235 Atherosclerosis of native arteries of right leg with ulceration of other part of foot: Secondary | ICD-10-CM | POA: Diagnosis present

## 2015-05-07 DIAGNOSIS — Z87891 Personal history of nicotine dependence: Secondary | ICD-10-CM | POA: Insufficient documentation

## 2015-05-07 DIAGNOSIS — I4891 Unspecified atrial fibrillation: Secondary | ICD-10-CM | POA: Insufficient documentation

## 2015-05-08 NOTE — Progress Notes (Signed)
[name], [name] ([id number]) Visit Report for 05/07/2015 Abuse/Suicide Risk Screen Details Patient Name: [name], [name]. Date of Service: 05/07/2015 9:30 AM Medical Record Number: [id number] Patient Account Number: [id number] Date of Birth/Sex: 21-Sep-1940 (75 y.o. Male) Treating RN: Ahmed Prima Primary Care Physician: Deland Pretty Other Clinician: Referring Physician: Mauricia Area Treating Physician/Extender: Frann Rider in Treatment: 0 Abuse/Suicide Risk Screen Items Answer ABUSE/SUICIDE RISK SCREEN: Has anyone close to you tried to hurt or harm you recentlyo No Do you feel uncomfortable with anyone in your familyo No Has anyone forced you do things that you didnot want to doo No Do you have any thoughts of harming yourselfo No Patient displays signs or symptoms of abuse and/or neglect. No Electronic Signature(s) Signed: 05/07/2015 5:37:28 PM By: Alric Quan Entered By: Alric Quan on 05/07/2015 10:17:23 [name] ([id number]) -------------------------------------------------------------------------------- Activities of Daily Living Details Patient Name: [name], [name]. Date of Service: 05/07/2015 9:30 AM Medical Record Number: [id number] Patient Account Number: [id number] Date of Birth/Sex: Sep 21, 1940 (75 y.o. Male) Treating RN: Ahmed Prima Primary Care Physician: Deland Pretty Other Clinician: Referring Physician: Mauricia Area Treating Physician/Extender: Frann Rider in Treatment: 0 Activities of Daily Living Items Answer Activities of Daily Living (Please select one for each item) Drive Automobile Not Able Take Medications Need Assistance Use Telephone Need Assistance Care for Appearance Need Assistance Use Toilet Need Assistance Bath / Shower Need Assistance Dress Self Need Assistance Feed Self Need Assistance Walk Need Assistance Get In / Out Bed Need Assistance Housework Need Assistance Prepare Meals Need  Assistance Handle Money Need Assistance Shop for Self Need Assistance Electronic Signature(s) Signed: 05/07/2015 5:37:28 PM By: Alric Quan Entered By: Alric Quan on 05/07/2015 10:18:18 [name] ([id number]) -------------------------------------------------------------------------------- Education Assessment Details Patient Name: [name]. Date of Service: 05/07/2015 9:30 AM Medical Record Number: [id number] Patient Account Number: [id number] Date of Birth/Sex: Mar 21, 1941 (75 y.o. Male) Treating RN: Ahmed Prima Primary Care Physician: Deland Pretty Other Clinician: Referring Physician: Mauricia Area Treating Physician/Extender: Frann Rider in Treatment: 0 Primary Learner Assessed: Patient Learning Preferences/Education Level/Primary Language Learning Preference: Explanation, Printed Material Highest Education Level: High School Preferred Language: English Cognitive Barrier Assessment/Beliefs Language Barrier: No Translator Needed: No Memory Deficit: No Emotional Barrier: No Cultural/Religious Beliefs Affecting Medical No Care: Physical Barrier Assessment Impaired Vision: Yes Glasses Impaired Hearing: No Decreased Hand dexterity: No Knowledge/Comprehension Assessment Knowledge Level: High Comprehension Level: High Ability to understand written High instructions: Ability to understand verbal High instructions: Motivation Assessment Anxiety Level: Calm Cooperation: Cooperative Education Importance: Acknowledges Need Interest in Health Problems: Asks Questions Perception: Coherent Willingness to Engage in Self- High Management Activities: Readiness to Engage in Self- High Management Activities: Electronic Signature(s) [name], [name] ([id number]) Signed: 05/07/2015 5:37:28 PM By: Alric Quan Entered By: Alric Quan on 05/07/2015 10:18:56 [name]  ([id number]) -------------------------------------------------------------------------------- Fall Risk Assessment Details Patient Name: [name]. Date of Service: 05/07/2015 9:30 AM Medical Record Number: [id number] Patient Account Number: [id number] Date of Birth/Sex: 03-Jun-1940 (75 y.o. Male) Treating RN: Ahmed Prima Primary Care Physician: Deland Pretty Other Clinician: Referring Physician: Mauricia Area Treating Physician/Extender: Frann Rider in Treatment: 0 Fall Risk Assessment Items Have you had 2 or more falls in the last 12 monthso 0 No Have you had any fall that resulted in injury in the last 12 monthso 0 Yes FALL RISK ASSESSMENT: History of falling - immediate or within 3 months 25 Yes Secondary diagnosis 15 Yes Ambulatory aid None/bed rest/wheelchair/nurse 0 No Crutches/cane/walker 0 No  Furniture 0 No IV Access/Saline Lock 0 No Gait/Training Normal/bed rest/immobile 0 No Weak 10 Yes Impaired 0 No Mental Status Oriented to own ability 0 No Electronic Signature(s) Signed: 05/07/2015 5:37:28 PM By: Alric Quan Entered By: Alric Quan on 05/07/2015 10:19:40 [name] ([id number]) -------------------------------------------------------------------------------- Foot Assessment Details Patient Name: [name]. Date of Service: 05/07/2015 9:30 AM Medical Record Number: [id number] Patient Account Number: [id number] Date of Birth/Sex: May 29, 1940 (75 y.o. Male) Treating RN: Ahmed Prima Primary Care Physician: Deland Pretty Other Clinician: Referring Physician: Mauricia Area Treating Physician/Extender: Frann Rider in Treatment: 0 Foot Assessment Items Site Locations + = Sensation present, - = Sensation absent, C = Callus, U = Ulcer R = Redness, W = Warmth, M = Maceration, PU = Pre-ulcerative lesion F = Fissure, S = Swelling, D = Dryness Assessment Right: Left: Other Deformity: No No Prior Foot Ulcer: No  No Prior Amputation: No No Charcot Joint: No No Ambulatory Status: Ambulatory Without Help Gait: Unsteady Electronic Signature(s) Signed: 05/07/2015 5:37:28 PM By: Alric Quan Entered By: Alric Quan on 05/07/2015 10:21:32 [name] ([id number]) -------------------------------------------------------------------------------- Nutrition Risk Assessment Details Patient Name: [name]. Date of Service: 05/07/2015 9:30 AM Medical Record Number: [id number] Patient Account Number: [id number] Date of Birth/Sex: 12/13/1940 (75 y.o. Male) Treating RN: Ahmed Prima Primary Care Physician: Deland Pretty Other Clinician: Referring Physician: Mauricia Area Treating Physician/Extender: Frann Rider in Treatment: 0 Height (in): 70 Weight (lbs): 145 Body Mass Index (BMI): 20.8 Nutrition Risk Assessment Items NUTRITION RISK SCREEN: I have an illness or condition that made me change the kind and/or 2 Yes amount of food I eat I eat fewer than two meals per day 0 No I eat few fruits and vegetables, or milk products 0 No I have three or more drinks of beer, liquor or wine almost every day 0 No I have tooth or mouth problems that make it hard for me to eat 0 No I don't always have enough money to buy the food I need 0 No I eat alone most of the time 0 No I take three or more different prescribed or over-the-counter drugs a 1 Yes day Without wanting to, I have lost or gained 10 pounds in the last six 2 Yes months I am not always physically able to shop, cook and/or feed myself 2 Yes Nutrition Protocols Good Risk Protocol Moderate Risk Protocol Electronic Signature(s) Signed: 05/07/2015 5:37:28 PM By: Alric Quan Entered By: Alric Quan on 05/07/2015 10:20:21

## 2015-05-08 NOTE — Progress Notes (Signed)
KAELIB, REIERSON ([id number]) Visit Report for 05/07/2015 Chief Complaint Document Details Patient Name: [name], [name]. Date of Service: 05/07/2015 9:30 AM Medical Record Number: [id number] Patient Account Number: [id number] Date of Birth/Sex: June 16, 1940 (75 y.o. Male) Treating RN: Ahmed Prima Primary Care Physician: Deland Pretty Other Clinician: Referring Physician: Mauricia Area Treating Physician/Extender: Frann Rider in Treatment: 0 Information Obtained from: Patient Chief Complaint Patient presents to the wound care center for a consult due non healing wound 2 his hands lower extremities and feet for about 6 weeks. Electronic Signature(s) Signed: 05/07/2015 11:36:22 AM By: Christin Fudge MD, FACS Entered By: Christin Fudge on 05/07/2015 11:36:22 [name] ([id number]) -------------------------------------------------------------------------------- HPI Details Patient Name: [name], BOBST. Date of Service: 05/07/2015 9:30 AM Medical Record Number: [id number] Patient Account Number: [id number] Date of Birth/Sex: 1940-09-26 (75 y.o. Male) Treating RN: Ahmed Prima Primary Care Physician: Deland Pretty Other Clinician: Referring Physician: Mauricia Area Treating Physician/Extender: Frann Rider in Treatment: 0 History of Present Illness Location: dry eschar over his hands feet and lower extremities Quality: Patient reports experiencing a sharp pain to affected area(s). Severity: Patient states wound are getting worse. Duration: Patient has had the wound for > 3 months prior to seeking treatment at the wound center Timing: Pain in wound is Intermittent (comes and goes Context: The wound appeared gradually over time Modifying Factors: Other treatment(s) tried include: recent vascular workup done by Dr. Gwenlyn Found and details are noted Associated Signs and Symptoms: Patient reports having difficulty standing for long periods. HPI Description: 75 year old  gentleman who was recently seen by Dr. Earleen Newport of podiatry and Dr. Quay Burow for an arterial study and was seen for peripheral arterial disease and critical limb ischemia. The patient was worked up with Doppler studies performed on 04/22/2015 which revealed a right TBI of 0.55 and a left ABI of 0.4. Duplex imaging revealed an occluded right posterior tibial artery but otherwise no systemic and large vessel disease. He had high-grade left popliteal artery stenosis and occluded posterior tibial and peroneal arteries. No intervention was recommended at this stage and he was to be followed up by the interventional cardiologist after 3 months. The patient's past medical history includes a recent T aVR, hypertension, chronic A. fib on Coumadin, chronic renal insufficiency on hemodialysis, diastolic dysfunction, history of bladder cancer with total cystectomy with a colonic diverting urostomy. The patient is a former smoker smoker for 33 years and gave it up and [place] Signature(s) Signed: 05/07/2015 11:40:58 AM By: Christin Fudge MD, FACS Entered By: Christin Fudge on 05/07/2015 11:40:58 [name] ([id number]) -------------------------------------------------------------------------------- Physical Exam Details Patient Name: [name]. Date of Service: 05/07/2015 9:30 AM Medical Record Number: [id number] Patient Account Number: [id number] Date of Birth/Sex: 1940/11/07 (75 y.o. Male) Treating RN: Ahmed Prima Primary Care Physician: Deland Pretty Other Clinician: Referring Physician: Mauricia Area Treating Physician/Extender: Frann Rider in Treatment: 0 Constitutional . Pulse regular. Respirations normal and unlabored. Afebrile. . Eyes Nonicteric. Reactive to light. Ears, Nose, Mouth, and Throat Lips, teeth, and gums WNL.[name] Moist mucosa without lesions. Neck supple and nontender. No palpable supraclavicular or cervical adenopathy. Normal sized without  goiter. Respiratory WNL. No retractions.. Cardiovascular Pedal Pulses WNL. no ABIs could be measured today but recent reports noted. No clubbing, cyanosis or edema. Gastrointestinal (GI) Abdomen without masses or tenderness.. No liver or spleen enlargement or tenderness.. Lymphatic No adneopathy. No adenopathy. No adenopathy. Musculoskeletal Adexa without tenderness or enlargement.. Digits and nails w/o clubbing, cyanosis, infection, petechiae, ischemia, or inflammatory  conditions.. Integumentary (Hair, Skin) No suspicious lesions. No crepitus or fluctuance. No peri-wound warmth or erythema. No masses.[name] Psychiatric Judgement and insight Intact.. No evidence of depression, anxiety, or agitation.. Notes he has multiple dry eschars over his hands, fingers, feet and lower extremities bilaterally. 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 there are a few areas between the toes on his left foot and on the hand of the left middle finger which has some moisture and over these we will use an appropriate dressing. Electronic Signature(s) Signed: 05/07/2015 11:42:55 AM By: Christin Fudge MD, FACS Previous Signature: 05/07/2015 11:42:15 AM Version By: Christin Fudge MD, FACS Entered By: Christin Fudge on 05/07/2015 11:42:55 [name] ([id number]) -------------------------------------------------------------------------------- Physician Orders Details Patient Name: [name], [name]. Date of Service: 05/07/2015 9:30 AM Medical Record Number: [id number] Patient Account Number: [id number] Date of Birth/Sex: 29-Feb-1941 (75 y.o. 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 Wound #1 Right,Circumferential Toe Great o Clean wound with Normal Saline. Wound #10 Left Toe Second o Clean wound with Normal Saline. Wound #11 Left Toe Third o Clean wound with Normal Saline. Wound #12 Left Toe Fourth o Clean wound with Normal Saline. Wound #13 Left Toe Fifth o Clean wound with Normal Saline. Wound #14 Left Calcaneus o Clean wound with Normal Saline. Wound #15 Left,Posterior Lower Leg o Clean wound with Normal Saline. Wound #16 Left Knee o Clean wound with Normal Saline. Wound #17 Right Hand - 2nd Digit o Clean wound with Normal Saline. Wound #18 Right Hand - 3rd Digit RYZEN, TO. ([id number]) o Clean wound with Normal Saline. Wound #19 Right Hand - 5th Digit o Clean wound with Normal Saline. Wound #2 Right,Circumferential Toe Second o Clean wound with Normal Saline. Wound #20 Left Hand - 1st Digit o Clean wound with Normal Saline. Wound #21 Left Hand - 3rd Digit o Clean wound with Normal Saline. Wound #22 Left Hand - 4th Digit o Clean wound with Normal Saline. Wound #3 Right Toe Third o Clean wound with Normal Saline. Wound #4 Right,Medial Toe Fifth o Clean wound with Normal Saline. Wound #5 Right Metatarsal head first o Clean wound with Normal Saline. Wound #6 Right Calcaneus o Clean wound with Normal Saline. Wound #7 Right Malleolus o Clean wound with Normal Saline. Wound #8 Right,Posterior Lower Leg o Clean wound with Normal Saline. Primary Wound Dressing Wound #1 Right,Circumferential Toe Great o Other: - paint with betadine Wound #10 Left Toe Second o Aquacel Ag Wound #11 Left Toe Third o Other: - paint with betadine Wound #12  Left Toe Fourth o Other: - paint with betadine Wound #13 Left Toe Fifth SEIJI, LETBETTER ([id number]) o Other: - paint with betadine Wound #14 Left Calcaneus o Other: - paint with betadine Wound #15 Left,Posterior Lower Leg o Other: - paint with betadine o Other: - paint with betadine Wound #16 Left Knee o Other: - paint with betadine Wound #17 Right Hand - 2nd Digit o Other: - paint with betadine Wound #18 Right Hand - 3rd Digit o Other: -  paint with betadine Wound #19 Right Hand - 5th Digit o Other: - paint with betadine Wound #2 Right,Circumferential Toe Second o Other: - paint with betadine Wound #20 Left Hand - 1st Digit o Aquacel Ag Wound #21 Left Hand - 3rd Digit o Aquacel Ag Wound #22 Left Hand - 4th Digit o Other: - paint with betadine Wound #3 Right Toe Third o Other: - paint with betadine Wound #4 Right,Medial Toe Fifth o Other: - paint with betadine Wound #5 Right Metatarsal head first o Other: - paint with betadine Wound #6 Right Calcaneus o Other: - paint with betadine Wound #7 Right Malleolus o Other: - paint with betadine Wound #8 Right,Posterior Lower Leg o Other: - paint with betadine EVERARDO, CROUSER ([id number]) o Other: - paint with betadine Secondary Dressing o ABD and Kerlix/Conform Wound #1 Right,Circumferential Toe Great o ABD and Kerlix/Conform Wound #10 Left Toe Second o ABD and Kerlix/Conform Wound #11 Left Toe Third o ABD and Kerlix/Conform Wound #12 Left Toe Fourth o ABD and Kerlix/Conform Wound #13 Left Toe Fifth o ABD and Kerlix/Conform Wound #14 Left Calcaneus o ABD and Kerlix/Conform Wound #15 Left,Posterior Lower Leg o ABD and Kerlix/Conform Wound #16 Left Knee o ABD and Kerlix/Conform Wound #17 Right Hand - 2nd Digit o ABD and Kerlix/Conform Wound #18 Right Hand - 3rd Digit o ABD and Kerlix/Conform Wound #19 Right Hand - 5th Digit o ABD and  Kerlix/Conform Wound #2 Right,Circumferential Toe Second o ABD and Kerlix/Conform Wound #20 Left Hand - 1st Digit o ABD and Kerlix/Conform Wound #21 Left Hand - 3rd Digit o ABD and Kerlix/Conform Wound #22 Left Hand - 4th Digit o ABD and Kerlix/Conform SUEO, ISRAEL. ([id number]) Wound #3 Right Toe Third o ABD and Kerlix/Conform Wound #4 Right,Medial Toe Fifth o ABD and Kerlix/Conform Wound #5 Right Metatarsal head first o ABD and Kerlix/Conform Wound #6 Right Calcaneus o ABD and Kerlix/Conform Wound #7 Right Malleolus o ABD and Kerlix/Conform Wound #8 Right,Posterior Lower Leg o ABD and Kerlix/Conform Dressing Change Frequency Wound #1 Right,Circumferential Toe Great o Change dressing every other day. Wound #10 Left Toe Second o Change dressing every day. Wound #11 Left Toe Third o Change dressing every other day. Wound #12 Left Toe Fourth o Change dressing every other day. Wound #13 Left Toe Fifth o Change dressing every other day. Wound #14 Left Calcaneus o Change dressing every other day. Wound #15 Left,Posterior Lower Leg o Change dressing every other day. Wound #16 Left Knee o Change dressing every other day. Wound #17 Right Hand - 2nd Digit o Change dressing every other day. Wound #18 Right Hand - 3rd Digit o Change dressing every other day. RANVIR, HALSEY ([id number]) Wound #19 Right Hand - 5th Digit o Change dressing every other day. Wound #2 Right,Circumferential Toe Second o Change dressing every other day. Wound #21 Left Hand - 3rd Digit o Change dressing every day. Wound #22 Left Hand - 4th Digit o Change dressing every day. o Change dressing every other day. Wound #3 Right Toe Third o Change dressing every other day. Wound #4 Right,Medial Toe Fifth o Change dressing every other day. Wound #5 Right Metatarsal head first o Change dressing every other day. Wound #6 Right Calcaneus o  Change dressing every other day. Wound #7 Right Malleolus o Change dressing every other day. Wound #8 Right,Posterior Lower Leg o Change dressing every other day. Follow-up Appointments o Return Appointment in 1 week. Wound #1 Right,Circumferential Toe Great o Return Appointment in 1  week. Wound #10 Left Toe Second o Return Appointment in 1 week. Wound #11 Left Toe Third o Return Appointment in 1 week. Wound #12 Left Toe Fourth o Return Appointment in 1 week. Wound #13 Left Toe Fifth o Return Appointment in 1 week. MESHULEM, DUNAGAN ([id number]) Wound #14 Left Calcaneus o Return Appointment in 1 week. Wound #15 Left,Posterior Lower Leg o Return Appointment in 1 week. Wound #16 Left Knee o Return Appointment in 1 week. Wound #17 Right Hand - 2nd Digit o Return Appointment in 1 week. Wound #18 Right Hand - 3rd Digit o Return Appointment in 1 week. Wound #19 Right Hand - 5th Digit o Return Appointment in 1 week. Wound #2 Right,Circumferential Toe Second o Return Appointment in 1 week. Wound #20 Left Hand - 1st Digit o Return Appointment in 1 week. Wound #21 Left Hand - 3rd Digit o Return Appointment in 1 week. Wound #22 Left Hand - 4th Digit o Return Appointment in 1 week. Wound #3 Right Toe Third o Return Appointment in 1 week. Wound #4 Right,Medial Toe Fifth o Return Appointment in 1 week. Wound #5 Right Metatarsal head first o Return Appointment in 1 week. Wound #6 Right Calcaneus o Return Appointment in 1 week. Wound #7 Right Malleolus o Return Appointment in 1 week. Wound #8 Right,Posterior Lower Leg o Return Appointment in 1 week. [name] ([id number]) Discharge From [facility] Services o Discharge from [place] - Transfer care to [place] #1 Right,Circumferential Toe Great o Discharge from [place] - Transfer care to [place] #10 Left Toe Second o  Discharge from [place] - Transfer care to [place] #11 Left Toe Third o Discharge from [place] - Transfer care to [place] #12 Left Toe Fourth o Discharge from [place] - Transfer care to [place] #13 Left Toe Fifth o Discharge from [place] - Transfer care to [place] #14 Left Calcaneus o Discharge from [place] - Transfer care to [place] #15 [place] Leg o Discharge from [place] - Transfer care to [place] #16 Left Knee o Discharge from [place] - Transfer care to [place] #17 Right Hand - 2nd Digit o Discharge from [place] - Transfer care to [place] #18 Right Hand - 3rd Digit o Discharge from [place] - Transfer care to [place] #19 Right Hand - 5th Digit o Discharge from [place] - Transfer care to [place] #2 Right,Circumferential Toe Second o Discharge from [place] - Transfer care to [place] #20 Left Hand - 1st Digit o Discharge from [place] - Transfer care to [place] #21 Left Hand - 3rd Digit o Discharge from [place] - Transfer care to [place] #22 Left Hand - 4th Digit o Discharge from [place] - Transfer care to [place] #3 Right Toe Third o Discharge from [place] - Transfer care to [place], [place]. ([id number]) Wound #4 Right,Medial Toe Fifth o Discharge from [place] - Transfer care to [place] #5 Right Metatarsal head first o Discharge from [place] - Transfer care to [place] #6 Right Calcaneus o  Discharge from [place] - Transfer  care to [place] #7 Right Malleolus o Discharge from [place] - Transfer care to [place] #8 Right,Posterior Lower Leg o Discharge from [place] - Transfer care to [place] Signature(s) Signed: 05/07/2015 5:05:49 PM By: Christin Fudge MD, FACS Signed: 05/07/2015 5:37:28 PM By: Alric Quan Entered By: Alric Quan on 05/07/2015 11:36:24 [name] ([id number]) -------------------------------------------------------------------------------- Problem List Details Patient Name: KHIZER, TOEWS. Date of Service: 05/07/2015 9:30 AM Medical Record Number: [id number] Patient Account Number: [id number] Date of Birth/Sex: 06-27-1940 (75 y.o. 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 Date of Service: 05/07/2015 9:30 AM Medical Record Number: [id number] Patient Account Number: [id number] Date of Birth/Sex: Feb 04, 1941  (75 y.o. Male) Treating RN: Ahmed Prima Primary Care Physician: Deland Pretty Other Clinician: Referring Physician: Mauricia Area Treating Physician/Extender: Frann Rider in Treatment: 0 Subjective Chief Complaint Information obtained from Patient Patient presents to the wound care center for a consult due non healing wound 2 his hands lower extremities and feet for about 6 weeks. History of Present Illness (HPI) The following HPI elements were documented for the patient's wound: Location: dry eschar over his hands feet and lower extremities Quality: Patient reports experiencing a sharp pain to affected area(s). Severity: Patient states wound are getting worse. Duration: Patient has had the wound for > 3 months prior to seeking treatment at the wound center Timing: Pain in wound is Intermittent (comes and goes Context: The wound appeared gradually over time Modifying Factors: Other treatment(s) tried include: recent vascular workup done by Dr. Gwenlyn Found and details are noted Associated Signs and Symptoms: Patient reports having difficulty standing for long periods. 75 year old gentleman who was recently seen by Dr. Earleen Newport of podiatry and Dr. Quay Burow for an arterial study and was seen for peripheral arterial disease and critical limb ischemia. The patient was worked up with Doppler studies performed on 04/22/2015 which revealed a right TBI of 0.55 and a left ABI of 0.4. Duplex imaging revealed an occluded right posterior tibial artery but otherwise no systemic and large vessel disease. He had high-grade left popliteal artery stenosis and occluded posterior tibial and peroneal arteries. No intervention was recommended at this stage and he was to be followed up by the interventional cardiologist after 3 months. The patient's past medical history includes a recent T aVR, hypertension, chronic A. fib on Coumadin, chronic renal insufficiency on hemodialysis, diastolic  dysfunction, history of bladder cancer with total cystectomy with a colonic diverting urostomy. The patient is a former smoker smoker for 33 years and gave it up and 1985 Wound History Patient reportedly has not tested positive for osteomyelitis. Patient reportedly has had testing performed to evaluate circulation  in the legs. Patient experiences the following problems associated with their wounds: swelling. Patient History Information obtained from Patient. KAIDIN, RUSHLOW ([id number]) Allergies No allergies have been documented for the patient Family History Cancer - Child, Heart Disease - Father, Stroke - Mother, No family history of Diabetes, Hereditary Spherocytosis, Hypertension, Kidney Disease, Lung Disease, Seizures, Thyroid Problems, Tuberculosis. Social History Former smoker - quit 1985, Marital Status - Married, Alcohol Use - Never - quit 1981, Drug Use - No History, Caffeine Use - Never. Medical History Eyes Patient has history of Cataracts - removed Respiratory Patient has history of Chronic Obstructive Pulmonary Disease (COPD) Cardiovascular Patient has history of Hypertension Musculoskeletal Patient has history of Osteoarthritis Neurologic Patient has history of Neuropathy Oncologic Patient has history of Received Chemotherapy - 1996-1998, Received Radiation - 1996-1998 Medical And Surgical History Notes Cardiovascular a-fib, heart vavle replacement, enlarged heart, calcification of heart Gastrointestinal right colon pouch Oncologic hx of bladder cancer Review of Systems (ROS) Constitutional Symptoms (General Health) The patient has no complaints or symptoms. Eyes Complains or has symptoms of Glasses / Contacts - glasses. Ear/Nose/Mouth/Throat The patient has no complaints or symptoms. Hematologic/Lymphatic Complains or has symptoms of Bleeding / Clotting Disorders - pt is on warfarin. Respiratory Complains or has symptoms of Chronic or frequent coughs,  Shortness of Breath. Gastrointestinal The patient has no complaints or symptoms. Endocrine The patient has no complaints or symptoms. Genitourinary [name] ([id number]) Complains or has symptoms of Kidney failure/ Dialysis. Immunological The patient has no complaints or symptoms. Integumentary (Skin) Complains or has symptoms of Wounds, Bleeding or bruising tendency - coumadin. Musculoskeletal Complains or has symptoms of Muscle Weakness. Neurologic The patient has no complaints or symptoms. Medications vancomycin intravenous solution intravenous aspirin 81 mg tablet,delayed release oral 1 1 tablet,delayed release (DR/EC) oral Tylenol Extra Strength 500 mg tablet oral 1 1 tablet oral warfarin 5 mg tablet oral tablet oral Adult Probiotic 3 billion cell capsule oral 1 1 capsule oral Imodium Multi-Symptom Relief 2 mg-125 mg tablet oral 1 1 tablet oral metoprolol succinate ER 50 mg tablet,extended release 24 hr oral tablet extended release 24 hr oral diltiazem ER 180 mg tablet,extended release 24 hr oral 1 1 tablet extended release 24 hr oral Renvela 800 mg tablet oral 1 1 tablet oral Silvadene 1 % topical cream topical cream topical Rena-Vite 0.8 mg tablet oral tablet oral Objective Constitutional Pulse regular. Respirations normal and unlabored. Afebrile. Vitals Time Taken: 9:50 AM, Height: 70 in, Source: Stated, Weight: 145 lbs, Source: Stated, BMI: 20.8, Pulse: 72 bpm, Respiratory Rate: 18 breaths/min, Blood Pressure: 99/40 mmHg. Eyes Nonicteric. Reactive to light. Ears, Nose, Mouth, and Throat Lips, teeth, and gums WNL.[name] Moist mucosa without lesions. Neck supple and nontender. No palpable supraclavicular or cervical adenopathy. Normal sized without goiter. Respiratory MALYKE, YANNUZZI ([id number]) WNL. No retractions.. Cardiovascular Pedal Pulses WNL. no ABIs could be measured today but recent reports noted. No clubbing, cyanosis or edema. Gastrointestinal  (GI) Abdomen without masses or tenderness.. No liver or spleen enlargement or tenderness.. Lymphatic No adneopathy. No adenopathy. No adenopathy. Musculoskeletal Adexa without tenderness or enlargement.. Digits and nails w/o clubbing, cyanosis, infection, petechiae, ischemia, or inflammatory conditions.[name] Psychiatric Judgement and insight Intact.. No evidence of depression, anxiety, or agitation.. General Notes: he has multiple dry eschars over his hands, fingers, feet and lower extremities bilaterally. 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 there are a few areas between the toes on his left foot and on the hand of the left middle finger which has some  moisture and over these we will use an appropriate dressing. Integumentary (Hair, Skin) No suspicious lesions. No crepitus or fluctuance. No peri-wound warmth or erythema. No masses.. Wound #1 status is Open. Original cause of wound was Gradually Appeared. The wound is located on the Right,Circumferential Toe Great. The wound measures 7.5cm length x 6.5cm width x 0.1cm depth; 38.288cm^2 area and 3.829cm^3 volume. The wound is limited to skin breakdown. There is no tunneling or undermining noted. There is a none present amount of drainage noted. The wound margin is flat and intact. There is no granulation within the wound bed. There is a large (67-100%) amount of necrotic tissue within the wound bed including Eschar. Periwound temperature was noted as No Abnormality. Wound #10 status is Open. Original cause of wound was Gradually Appeared. The wound is located on the Left Toe Second. The wound measures 2.5cm length x 1cm width x 0.1cm depth; 1.963cm^2 area and 0.196cm^3 volume. The wound is limited to skin breakdown. There is no tunneling or undermining noted. There is a none present amount of drainage noted. The wound margin is flat and intact. There is no granulation within the wound bed. There is a large (67-100%) amount of necrotic tissue within  the wound bed including Eschar. The periwound skin appearance had no abnormalities noted for moisture. The periwound skin appearance exhibited: Localized Edema. Periwound temperature was noted as No Abnormality. Wound #11 status is Open. Original cause of wound was Gradually Appeared. The wound is located on the Left Toe Third. The wound measures 1cm length x 1cm width x 0.1cm depth; 0.785cm^2 area and 0.079cm^3 volume. The wound is limited to skin breakdown. There is no tunneling or undermining noted. There is a none present amount of drainage noted. The wound margin is flat and intact. There is no granulation within the wound bed. There is a large (67-100%) amount of necrotic tissue within the wound bed including Eschar. The periwound skin appearance exhibited: Localized Edema. Wound #12 status is Open. Original cause of wound was Gradually Appeared. The wound is located on the [place]. ([id number]) Left Toe Fourth. The wound measures 0.5cm length x 0.7cm width x 0.1cm depth; 0.275cm^2 area and 0.027cm^3 volume. The wound is limited to skin breakdown. There is no tunneling or undermining noted. There is a none present amount of drainage noted. The wound margin is flat and intact. There is no granulation within the wound bed. There is a large (67-100%) amount of necrotic tissue within the wound bed including Eschar. The periwound skin appearance exhibited: Localized Edema. The periwound skin appearance did not exhibit: Dry/Scaly, Maceration, Moist. Wound #12 status is Healed - Epithelialized. Original cause of wound was Gradually Appeared. The wound is located on the Left Toe Fourth. The wound measures 0cm length x 0cm width x 0cm depth; 0cm^2 area and 0cm^3 volume. Wound #12 status is Healed - Epithelialized. Original cause of wound was Gradually Appeared. The wound is located on the Left Toe Fourth. The wound measures 0cm length x 0cm width x 0cm depth; 0cm^2 area and 0cm^3  volume. Wound #13 status is Open. Original cause of wound was Gradually Appeared. The wound is located on the Left Toe Fifth. The wound measures 4.5cm length x 7cm width x 0.1cm depth; 24.74cm^2 area and 2.474cm^3 volume. The wound is limited to skin breakdown. There is no tunneling or undermining noted. There is a none present amount of drainage noted. The wound margin is flat and intact. There is no granulation within the wound  bed. There is a large (67-100%) amount of necrotic tissue within the wound bed including Eschar. The periwound skin appearance had no abnormalities noted for moisture. The periwound skin appearance exhibited: Localized Edema. Wound #14 status is Open. Original cause of wound was Gradually Appeared. The wound is located on the Left Calcaneus. The wound measures 4.3cm length x 3cm width x 0.1cm depth; 10.132cm^2 area and 1.013cm^3 volume. The wound is limited to skin breakdown. There is no tunneling or undermining noted. There is a none present amount of drainage noted. The wound margin is flat and intact. There is no granulation within the wound bed. There is a large (67-100%) amount of necrotic tissue within the wound bed including Eschar. The periwound skin appearance had no abnormalities noted for moisture. The periwound skin appearance exhibited: Localized Edema. Periwound temperature was noted as No Abnormality. Wound #15 status is Open. Original cause of wound was Gradually Appeared. The wound is located on the Left,Posterior Lower Leg. The wound measures 7cm length x 1cm width x 0.1cm depth; 5.498cm^2 area and 0.55cm^3 volume. There is no tunneling or undermining noted. There is a medium amount of serous drainage noted. The wound margin is flat and intact. There is no granulation within the wound bed. There is a large (67-100%) amount of necrotic tissue within the wound bed including Eschar and Adherent Slough. The periwound skin appearance exhibited: Localized  Edema, Moist. Periwound temperature was noted as No Abnormality. The periwound has tenderness on palpation. Wound #16 status is Open. Original cause of wound was Gradually Appeared. The wound is located on the Left Knee. The wound measures 3cm length x 1.4cm width x 0.1cm depth; 3.299cm^2 area and 0.33cm^3 volume. The wound is limited to skin breakdown. There is no tunneling or undermining noted. There is a none present amount of drainage noted. The wound margin is flat and intact. There is no granulation within the wound bed. There is a large (67-100%) amount of necrotic tissue within the wound bed including Eschar. The periwound skin appearance had no abnormalities noted for texture. The periwound skin appearance did not exhibit: Dry/Scaly, Maceration, Moist. Periwound temperature was noted as No Abnormality. The periwound has tenderness on palpation. Wound #17 status is Open. Original cause of wound was Gradually Appeared. The wound is located on the Right Hand - 2nd Digit. The wound measures 5.8cm length x 2.5cm width x 0.1cm depth; 11.388cm^2 area LENO, TASCA. ([id number]) and 1.139cm^3 volume. The wound is limited to skin breakdown. There is a none present amount of drainage noted. The wound margin is flat and intact. There is no granulation within the wound bed. There is a large (67-100%) amount of necrotic tissue within the wound bed including Eschar. The periwound skin appearance did not exhibit: Callus, Crepitus, Excoriation, Fluctuance, Friable, Induration, Localized Edema, Rash, Scarring, Dry/Scaly, Maceration, Moist, Atrophie Blanche, Cyanosis, Ecchymosis, Hemosiderin Staining, Mottled, Pallor, Rubor, Erythema. Wound #18 status is Open. Original cause of wound was Gradually Appeared. The wound is located on the Right Hand - 3rd Digit. The wound measures 0.9cm length x 0.7cm width x 0.1cm depth; 0.495cm^2 area and 0.049cm^3 volume. There is no tunneling or undermining noted. There  is a none present amount of drainage noted. The wound margin is flat and intact. There is no granulation within the wound bed. There is a large (67-100%) amount of necrotic tissue within the wound bed including Eschar. The periwound skin appearance exhibited: Localized Edema. The periwound skin appearance did not exhibit: Dry/Scaly, Maceration, Moist. Periwound temperature  was noted as No Abnormality. The periwound has tenderness on palpation. Wound #19 status is Open. Original cause of wound was Gradually Appeared. The wound is located on the Right Hand - 5th Digit. The wound measures 2cm length x 1.4cm width x 0.1cm depth; 2.199cm^2 area and 0.22cm^3 volume. The wound is limited to skin breakdown. There is no undermining noted. There is a none present amount of drainage noted. There is no granulation within the wound bed. There is a large (67- 100%) amount of necrotic tissue within the wound bed including Eschar. The periwound skin appearance exhibited: Localized Edema. The periwound skin appearance did not exhibit: Dry/Scaly, Maceration, Moist. Periwound temperature was noted as No Abnormality. The periwound has tenderness on palpation. Wound #2 status is Open. Original cause of wound was Gradually Appeared. The wound is located on the Right,Circumferential Toe Second. The wound measures 5.6cm length x 5cm width x 0.1cm depth; 21.991cm^2 area and 2.199cm^3 volume. The wound is limited to skin breakdown. There is no tunneling or undermining noted. There is a none present amount of drainage noted. The wound margin is flat and intact. There is no granulation within the wound bed. There is a large (67-100%) amount of necrotic tissue within the wound bed including Eschar. The periwound skin appearance exhibited: Localized Edema. Periwound temperature was noted as No Abnormality. Wound #20 status is Open. Original cause of wound was Gradually Appeared. The wound is located on the Left Hand - 1st  Digit. The wound measures 0.6cm length x 0.5cm width x 0.1cm depth; 0.236cm^2 area and 0.024cm^3 volume. The wound is limited to skin breakdown. There is a small amount of sanguinous drainage noted. The wound margin is indistinct and nonvisible. There is small (1-33%) granulation within the wound bed. There is no necrotic tissue within the wound bed. The periwound skin appearance exhibited: Moist. The periwound skin appearance did not exhibit: Callus, Crepitus, Excoriation, Fluctuance, Friable, Induration, Localized Edema, Rash, Scarring, Dry/Scaly, Maceration, Atrophie Blanche, Cyanosis, Ecchymosis, Hemosiderin Staining, Mottled, Pallor, Rubor, Erythema. Wound #20 status is Healed - Epithelialized. Original cause of wound was Gradually Appeared. The wound is located on the Left Hand - 1st Digit. The wound measures 0cm length x 0cm width x 0cm depth; 0cm^2 area and 0cm^3 volume. Wound #21 status is Open. Original cause of wound was Gradually Appeared. The wound is located on the Left Hand - 3rd Digit. The wound measures 5cm length x 2cm width x 0.1cm depth; 7.854cm^2 area and 0.785cm^3 volume. The wound is limited to skin breakdown. There is no tunneling or undermining noted. There is a none present amount of drainage noted. The wound margin is flat and intact. There is no granulation within the wound bed. There is a large (67-100%) amount of necrotic tissue within the wound bed including Eschar. The periwound skin appearance did not exhibit: Dry/Scaly, Maceration, WILMAR, BRUNEY. ([id number]) Periwound temperature was noted as No Abnormality. The periwound has tenderness on palpation. Wound #22 status is Open. Original cause of wound was Gradually Appeared. The wound is located on the Left Hand - 4th Digit. The wound measures 0.8cm length x 1.3cm width x 0.1cm depth; 0.817cm^2 area and 0.082cm^3 volume. The wound is limited to skin breakdown. There is a none present amount of  drainage noted. The wound margin is flat and intact. There is no granulation within the wound bed. There is a large (67-100%) amount of necrotic tissue within the wound bed including Eschar. The periwound skin appearance did not exhibit: Callus, Crepitus,  Excoriation, Fluctuance, Friable, Induration, Localized Edema, Rash, Scarring, Dry/Scaly, Maceration, Moist, Atrophie Blanche, Cyanosis, Ecchymosis, Hemosiderin Staining, Mottled, Pallor, Rubor, Erythema. Wound #3 status is Open. Original cause of wound was Gradually Appeared. The wound is located on the Right Toe Third. The wound measures 1.5cm length x 1.7cm width x 0.1cm depth; 2.003cm^2 area and 0.2cm^3 volume. The wound is limited to skin breakdown. There is no tunneling or undermining noted. There is a none present amount of drainage noted. The wound margin is flat and intact. There is no granulation within the wound bed. There is a large (67-100%) amount of necrotic tissue within the wound bed including Eschar. The periwound skin appearance exhibited: Localized Edema. Periwound temperature was noted as No Abnormality. Wound #4 status is Open. Original cause of wound was Gradually Appeared. The wound is located on the Right,Medial Toe Fifth. The wound measures 0.9cm length x 0.5cm width x 0.1cm depth; 0.353cm^2 area and 0.035cm^3 volume. The wound is limited to skin breakdown. There is no tunneling or undermining noted. There is a none present amount of drainage noted. The wound margin is flat and intact. There is no granulation within the wound bed. There is a large (67-100%) amount of necrotic tissue within the wound bed including Eschar. The periwound skin appearance exhibited: Localized Edema. Periwound temperature was noted as No Abnormality. Wound #5 status is Open. Original cause of wound was Gradually Appeared. The wound is located on the Right Metatarsal head first. The wound measures 1.5cm length x 2.2cm width x 0.1cm depth;  2.592cm^2 area and 0.259cm^3 volume. The wound is limited to skin breakdown. There is no tunneling or undermining noted. There is a none present amount of drainage noted. The wound margin is flat and intact. There is no granulation within the wound bed. There is a large (67-100%) amount of necrotic tissue within the wound bed including Eschar. The periwound skin appearance exhibited: Localized Edema. Periwound temperature was noted as No Abnormality. Wound #6 status is Open. Original cause of wound was Gradually Appeared. The wound is located on the Right Calcaneus. The wound measures 3cm length x 5.5cm width x 0.1cm depth; 12.959cm^2 area and 1.296cm^3 volume. The wound is limited to skin breakdown. There is no tunneling or undermining noted. There is a none present amount of drainage noted. There is no granulation within the wound bed. There is a large (67-100%) amount of necrotic tissue within the wound bed including Eschar. The periwound skin appearance exhibited: Localized Edema. The periwound skin appearance did not exhibit: Rash, Dry/Scaly, Maceration, Moist. Periwound temperature was noted as No Abnormality. Wound #7 status is Open. Original cause of wound was Gradually Appeared. The wound is located on the Right Malleolus. The wound measures 3.2cm length x 4cm width x 0.1cm depth; 10.053cm^2 area and 1.005cm^3 volume. The wound is limited to skin breakdown. There is no tunneling or undermining noted. There is a none present amount of drainage noted. The wound margin is flat and intact. There is no granulation within the wound bed. There is a large (67-100%) amount of necrotic tissue within the wound bed including Eschar. The periwound skin appearance had no abnormalities noted for moisture. The periwound has tenderness on palpation. KEIJUAN, IWANICKI. ([id number]) Wound #8 status is Open. Original cause of wound was Gradually Appeared. The wound is located on the Right,Posterior Lower  Leg. The wound measures 9cm length x 3cm width x 0.1cm depth; 21.206cm^2 area and 2.121cm^3 volume. The wound is limited to skin breakdown. There is no tunneling  or undermining noted. There is a none present amount of drainage noted. The wound margin is flat and intact. There is no granulation within the wound bed. There is a large (67-100%) amount of necrotic tissue within the wound bed including Eschar and Adherent Slough. The periwound skin appearance had no abnormalities noted for moisture. The periwound skin appearance exhibited: Localized Edema. Periwound temperature was noted as No Abnormality. The periwound has tenderness on palpation. Wound #9 status is Open. Original cause of wound was Gradually Appeared. The wound is located on the Left Toe Great. The wound measures 3.5cm length x 4.5cm width x 0.1cm depth; 12.37cm^2 area and 1.237cm^3 volume. The wound is limited to skin breakdown. There is no tunneling or undermining noted. There is a none present amount of drainage noted. There is no granulation within the wound bed. There is a large (67-100%) amount of necrotic tissue within the wound bed including Eschar. The periwound skin appearance had no abnormalities noted for moisture. The periwound skin appearance exhibited: Localized Edema. Periwound temperature was noted as No Abnormality. 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 he also has significant bilateral arthrosclerotic disease and due to his multiple comorbidities no  vascular process has been recommended. The patient and his wife have opted for palliative care and at this stage we will support him with appropriate management of his wounds. I have recommended Betadine painting of all the dry areas with eschar and we will use Aquacel Ag to the areas which are more mildly moist. Will continue with hemodialysis and appropriate antibiotic during this period and would continue to follow-up with all his other physicians. He would like to follow-up at the [facility] wound center and will make appropriate arrangements for a transfer. DAVARION, SAFARIAN ([id number]) Plan Wound Cleansing: Clean wound with Normal Saline. Wound #1 Right,Circumferential Toe Great: Clean wound with Normal Saline. Wound #10 Left Toe Second: Clean wound with Normal Saline. Wound #11 Left Toe Third: Clean wound with Normal Saline. Wound #12 Left Toe Fourth: Clean wound with Normal Saline. Wound #13 Left Toe Fifth: Clean wound with Normal Saline. Wound #14 Left Calcaneus: Clean wound with Normal Saline. Wound #15 Left,Posterior Lower Leg: Clean wound with Normal Saline. Wound #16 Left Knee: Clean wound with Normal Saline. Wound #17 Right Hand - 2nd Digit: Clean wound with Normal Saline. Wound #18 Right Hand - 3rd Digit: Clean wound with Normal Saline. Wound #19 Right Hand - 5th Digit: Clean wound with Normal Saline. Wound #2 Right,Circumferential Toe Second: Clean wound with Normal Saline. Wound #20 Left Hand - 1st Digit: Clean wound with Normal Saline. Wound #21 Left Hand - 3rd Digit: Clean wound with Normal Saline. Wound #22 Left Hand - 4th Digit: Clean wound with Normal Saline. Wound #3 Right Toe Third: Clean wound with Normal Saline. Wound #4 Right,Medial Toe Fifth: Clean wound with Normal Saline. Wound #5 Right Metatarsal head first: Clean wound with Normal Saline. Wound #6 Right Calcaneus: Clean wound with Normal Saline. Wound #7 Right Malleolus: Clean wound  with Normal Saline. Wound #8 Right,Posterior Lower Leg: Clean wound with Normal Saline. Primary Wound Dressing: Wound #  1 Right,Circumferential Toe Great: Other: - paint with betadine Wound #10 Left Toe SecondLUDWIK, DANIELS ([id number]) Aquacel Ag Wound #11 Left Toe Third: Other: - paint with betadine Wound #12 Left Toe Fourth: Other: - paint with betadine Wound #13 Left Toe Fifth: Other: - paint with betadine Wound #14 Left Calcaneus: Other: - paint with betadine Wound #15 Left,Posterior Lower Leg: Other: - paint with betadine Other: - paint with betadine Wound #16 Left Knee: Other: - paint with betadine Wound #17 Right Hand - 2nd Digit: Other: - paint with betadine Wound #18 Right Hand - 3rd Digit: Other: - paint with betadine Wound #19 Right Hand - 5th Digit: Other: - paint with betadine Wound #2 Right,Circumferential Toe Second: Other: - paint with betadine Wound #20 Left Hand - 1st Digit: Aquacel Ag Wound #21 Left Hand - 3rd Digit: Aquacel Ag Wound #22 Left Hand - 4th Digit: Other: - paint with betadine Wound #3 Right Toe Third: Other: - paint with betadine Wound #4 Right,Medial Toe Fifth: Other: - paint with betadine Wound #5 Right Metatarsal head first: Other: - paint with betadine Wound #6 Right Calcaneus: Other: - paint with betadine Wound #7 Right Malleolus: Other: - paint with betadine Wound #8 Right,Posterior Lower Leg: Other: - paint with betadine Other: - paint with betadine Secondary Dressing: ABD and Kerlix/Conform Wound #1 Right,Circumferential Toe Great: ABD and Kerlix/Conform Wound #10 Left Toe Second: ABD and Kerlix/Conform Wound #11 Left Toe Third: ABD and Kerlix/Conform Wound #12 Left Toe Fourth: ABD and Kerlix/Conform MOHMMED, DHARIA ([id number]) Wound #13 Left Toe Fifth: ABD and Kerlix/Conform Wound #14 Left Calcaneus: ABD and Kerlix/Conform Wound #15 Left,Posterior Lower Leg: ABD and Kerlix/Conform Wound #16 Left Knee: ABD  and Kerlix/Conform Wound #17 Right Hand - 2nd Digit: ABD and Kerlix/Conform Wound #18 Right Hand - 3rd Digit: ABD and Kerlix/Conform Wound #19 Right Hand - 5th Digit: ABD and Kerlix/Conform Wound #2 Right,Circumferential Toe Second: ABD and Kerlix/Conform Wound #20 Left Hand - 1st Digit: ABD and Kerlix/Conform Wound #21 Left Hand - 3rd Digit: ABD and Kerlix/Conform Wound #22 Left Hand - 4th Digit: ABD and Kerlix/Conform Wound #3 Right Toe Third: ABD and Kerlix/Conform Wound #4 Right,Medial Toe Fifth: ABD and Kerlix/Conform Wound #5 Right Metatarsal head first: ABD and Kerlix/Conform Wound #6 Right Calcaneus: ABD and Kerlix/Conform Wound #7 Right Malleolus: ABD and Kerlix/Conform Wound #8 Right,Posterior Lower Leg: ABD and Kerlix/Conform Dressing Change Frequency: Wound #1 Right,Circumferential Toe Great: Change dressing every other day. Wound #10 Left Toe Second: Change dressing every day. Wound #11 Left Toe Third: Change dressing every other day. Wound #12 Left Toe Fourth: Change dressing every other day. Wound #13 Left Toe Fifth: Change dressing every other day. Wound #14 Left Calcaneus: Change dressing every other day. Wound #15 Left,Posterior Lower Leg: Change dressing every other day. Wound #16 Left Knee: Change dressing every other day. MAURIO, COBERLEY ([id number]) Wound #17 Right Hand - 2nd Digit: Change dressing every other day. Wound #18 Right Hand - 3rd Digit: Change dressing every other day. Wound #19 Right Hand - 5th Digit: Change dressing every other day. Wound #2 Right,Circumferential Toe Second: Change dressing every other day. Wound #21 Left Hand - 3rd Digit: Change dressing every day. Wound #22 Left Hand - 4th Digit: Change dressing every day. Change dressing every other day. Wound #3 Right Toe Third: Change dressing every other day. Wound #4 Right,Medial Toe Fifth: Change dressing every other day. Wound #5 Right Metatarsal head  first: Change dressing every other day. Wound #  6 Right Calcaneus: Change dressing every other day. Wound #7 Right Malleolus: Change dressing every other day. Wound #8 Right,Posterior Lower Leg: Change dressing every other day. Follow-up Appointments: Return Appointment in 1 week. Wound #1 Right,Circumferential Toe Great: Return Appointment in 1 week. Wound #10 Left Toe Second: Return Appointment in 1 week. Wound #11 Left Toe Third: Return Appointment in 1 week. Wound #12 Left Toe Fourth: Return Appointment in 1 week. Wound #13 Left Toe Fifth: Return Appointment in 1 week. Wound #14 Left Calcaneus: Return Appointment in 1 week. Wound #15 Left,Posterior Lower Leg: Return Appointment in 1 week. Wound #16 Left Knee: Return Appointment in 1 week. Wound #17 Right Hand - 2nd Digit: Return Appointment in 1 week. Wound #18 Right Hand - 3rd Digit: Return Appointment in 1 week. Wound #19 Right Hand - 5th Digit: Return Appointment in 1 week. Wound #2 Right,Circumferential Toe Second: Return Appointment in 1 week. LENG, QUISPE ([id number]) Wound #20 Left Hand - 1st Digit: Return Appointment in 1 week. Wound #21 Left Hand - 3rd Digit: Return Appointment in 1 week. Wound #22 Left Hand - 4th Digit: Return Appointment in 1 week. Wound #3 Right Toe Third: Return Appointment in 1 week. Wound #4 Right,Medial Toe Fifth: Return Appointment in 1 week. Wound #5 Right Metatarsal head first: Return Appointment in 1 week. Wound #6 Right Calcaneus: Return Appointment in 1 week. Wound #7 Right Malleolus: Return Appointment in 1 week. Wound #8 Right,Posterior Lower Leg: Return Appointment in 1 week. Discharge From [facility]: Discharge from [place] - Transfer care to [place] #1 Right,Circumferential Toe Great: Discharge from [place] - Transfer care to [place] #10 Left Toe Second: Discharge from [place] - Transfer  care to [place] #11 Left Toe Third: Discharge from [place] - Transfer care to [place] #12 Left Toe Fourth: Discharge from [place] - Transfer care to [place] #13 Left Toe Fifth: Discharge from [place] - Transfer care to [place] #14 Left Calcaneus: Discharge from [place] - Transfer care to [place] #15 [place] Leg: Discharge from [place] - Transfer care to [place] #16 Left Knee: Discharge from [place] - Transfer care to [place] #17 Right Hand - 2nd Digit: Discharge from [place] - Transfer care to [place] #18 Right Hand - 3rd Digit: Discharge from [place] - Transfer care to [place] #19 Right Hand - 5th Digit: Discharge from [place] - Transfer care to [place] #2 Right,Circumferential Toe Second: Discharge from [place] - Transfer care to [place] #20 Left Hand - 1st Digit: Discharge from [place] - Transfer care to [place] #21 Left Hand - 3rd Digit: Discharge from [place] - Transfer care to [place] #22 Left Hand - 4th Digit: Discharge from [place] - Transfer care to [place] #3 Right Toe Third: [name] ([id number]) Discharge from [place] - Transfer care to [place] #4 Right,Medial Toe Fifth: Discharge from [place] - Transfer care to [place] #5 Right Metatarsal head first: Discharge from [place] - Transfer care to [place] #6 Right Calcaneus: Discharge  from [place] - Transfer care to [place] #7 Right Malleolus: Discharge from [place] -  Transfer care to [place] #8 Right,Posterior Lower Leg: Discharge from [place] - Transfer care to [place] This 75 year old morbidly gentleman who has multiple comorbidities has 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 he also has significant bilateral arthrosclerotic disease and due to his multiple comorbidities no vascular process has been recommended. The patient and his wife have opted for palliative care and at this stage we will support him with appropriate management of his wounds. I have recommended Betadine painting of all the dry areas with eschar and we will use Aquacel Ag to the areas which are more mildly moist. Will continue with hemodialysis and appropriate antibiotic during this period and would continue to follow-up with all his other physicians. He would like to follow-up at the [facility] wound center and will make appropriate arrangements for a transfer. Electronic Signature(s) Signed: 05/07/2015 11:48:20 AM By: Christin Fudge MD, FACS Entered By: Christin Fudge on 05/07/2015 11:48:20 [name] ([id number]) -------------------------------------------------------------------------------- ROS/PFSH Details Patient Name: [name]. Date of Service: 05/07/2015 9:30 AM Medical Record Number: [id number] Patient Account Number: [id number] Date of Birth/Sex: 1940-10-17 (75 y.o. Male) Treating RN: Ahmed Prima Primary Care Physician: Deland Pretty Other Clinician: Referring Physician: Mauricia Area Treating Physician/Extender: Frann Rider in Treatment: 0 Information Obtained From Patient Wound History Do you currently have one or more open woundso Yes Approximately how long have you had your woundso 2 months How have you been treating your wound(s) until nowo silverdene Has your wound(s) ever healed and then re-openedo No Have you had any lab work done in the past montho  No Have you tested positive for an antibiotic resistant organism (MRSA, VRE)o No Have you tested positive for osteomyelitis (bone infection)o No Have you had any tests for circulation on your legso Yes Who ordered the testo Alvester Chou Where was the test doneo [place] Have you had other problems associated with your woundso Swelling Eyes Complaints and Symptoms: Positive for: Glasses / Contacts - glasses Medical History: Positive for: Cataracts - removed Hematologic/Lymphatic Complaints and Symptoms: Positive for: Bleeding / Clotting Disorders - pt is on warfarin Respiratory Complaints and Symptoms: Positive for: Chronic or frequent coughs; Shortness of Breath Medical History: Positive for: Chronic Obstructive Pulmonary Disease (COPD) Genitourinary Complaints and Symptoms: Positive for: Kidney failure/ Dialysis JOHNWESLEY, MERICLE ([id number]) Integumentary (Skin) Complaints and Symptoms: Positive for: Wounds; Bleeding or bruising tendency - coumadin Musculoskeletal Complaints and Symptoms: Positive for: Muscle Weakness Medical History: Positive for: Osteoarthritis Constitutional Symptoms (General Health) Complaints and Symptoms: No Complaints or Symptoms Ear/Nose/Mouth/Throat Complaints and Symptoms: No Complaints or Symptoms Cardiovascular Medical History: Positive for: Hypertension Past Medical History Notes: a-fib, heart vavle replacement, enlarged heart, calcification of heart Gastrointestinal Complaints and Symptoms: No Complaints or Symptoms Medical History: Past Medical History Notes: right colon pouch Endocrine Complaints and Symptoms: No Complaints or Symptoms Immunological Complaints and Symptoms: No Complaints or Symptoms Neurologic Complaints and Symptoms: No Complaints or Symptoms JOHNHENRY, GRISSO ([id number]) Medical History: Positive for: Neuropathy Oncologic Medical History: Positive for: Received Chemotherapy [id number]; Received Radiation [id number] Past Medical History Notes: hx of bladder cancer HBO Extended History Items Eyes: Cataracts Family and Social History Cancer: Yes - Child; Diabetes: No; Heart Disease: Yes - Father; Hereditary Spherocytosis: No; Hypertension: No; Kidney Disease: No; Lung Disease: No; Seizures: No; Stroke: Yes - Mother; Thyroid  Problems: No; Tuberculosis: No; Former smoker - quit 1985; Marital Status - Married; Alcohol Use: Never - quit 1981; Drug Use: No History; Caffeine Use: Never; Financial Concerns: No; Food, Clothing or Shelter Needs: No; Support System Lacking: No; Transportation Concerns: Yes - pt cant drive; Advanced Directives: Yes (Not Provided); Patient does not want information on Advanced Directives; Do not resuscitate: Yes (Not Provided); Living Will: Yes (Copy provided); Medical Power of Attorney: Yes (Not Provided) Physician Affirmation I have reviewed and agree with the above information. Electronic Signature(s) Signed: 05/07/2015 11:03:01 AM By: Christin Fudge MD, FACS Signed: 05/07/2015 5:37:28 PM By: Alric Quan Entered By: Christin Fudge on 05/07/2015 11:02:59 [name] ([id number]) -------------------------------------------------------------------------------- SuperBill Details Patient Name: EUTIMIO, SHADDOX. Date of Service: 05/07/2015 Medical Record Number: [id number] Patient Account Number: [id number] Date of Birth/Sex: 10/22/1940 (75 y.o. 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.59 Other disorders  of calcium metabolism Facility Procedures CPT4 Code: [id number] Description: [id number] - WOUND CARE VISIT-LEV 5 EST PT Modifier: Quantity: 1 Physician Procedures CPT4: Description Modifier Quantity Code [id number] - WC PHYS LEVEL 4 - NEW PT 1 ICD-10 Description Diagnosis I70.235 Atherosclerosis of native arteries of right leg with ulceration of other part of foot I70.245 Atherosclerosis of native arteries of  left leg with ulceration of other part of foot N18.6 End stage renal disease E83.59 Other disorders of calcium metabolism Electronic Signature(s) Signed: 05/07/2015 11:48:44 AM By: Christin Fudge MD, FACS Entered By: Christin Fudge on 05/07/2015 11:48:44

## 2015-05-08 NOTE — Progress Notes (Signed)
DENNISON, LABOMBARD ([id number]) Visit Report for 05/07/2015 Allergy List Details Patient Name: [name], [name]. Date of Service: 05/07/2015 9:30 AM Medical Record Number: [id number] Patient Account Number: [id number] Date of Birth/Sex: 1940-12-17 (75 y.o. Male) Treating RN: Ahmed Prima Primary Care Physician: Deland Pretty Other Clinician: Referring Physician: Mauricia Area Treating Physician/Extender: Frann Rider in Treatment: 0 Electronic Signature(s) Signed: 05/07/2015 5:37:28 PM By: Alric Quan Entered By: Alric Quan on 05/07/2015 10:04:40 [name] ([id number]) -------------------------------------------------------------------------------- Arrival Information Details Patient Name: [name], [name]. Date of Service: 05/07/2015 9:30 AM Medical Record Number: [id number] Patient Account Number: [id number] Date of Birth/Sex: [birth year] (75 y.o. Male) Treating RN: Ahmed Prima Primary Care Physician: Deland Pretty Other Clinician: Referring Physician: Mauricia Area Treating Physician/Extender: Frann Rider in Treatment: 0 Visit Information Patient Arrived: Ambulatory Arrival Time: 09:47 Accompanied By: wife Transfer Assistance: None Patient Identification Verified: Yes Secondary Verification Process Yes Completed: Patient Requires Transmission- No Based Precautions: Patient Has Alerts: Yes Patient Alerts: Patient on Blood Thinner warfarin Electronic Signature(s) Signed: 05/07/2015 5:37:28 PM By: Alric Quan Entered By: Alric Quan on 05/07/2015 09:49:33 [name] ([id number]) -------------------------------------------------------------------------------- Clinic Level of Care Assessment Details Patient Name: [name]. Date of Service: 05/07/2015 9:30 AM Medical Record Number: [id number] Patient Account Number: [id number] Date of Birth/Sex: Mar 31, 1941 (75 y.o. Male) Treating RN: Cornell Barman Primary Care Physician:  Deland Pretty Other Clinician: Referring Physician: Mauricia Area Treating Physician/Extender: Frann Rider in Treatment: 0 Clinic Level of Care Assessment Items TOOL 2 Quantity Score []  - Use when only an EandM is performed on the INITIAL visit 0 ASSESSMENTS - Nursing Assessment / Reassessment X - General Physical Exam (combine w/ comprehensive assessment (listed just 1 20 below) when performed on new pt. evals) X - Comprehensive Assessment (HX, ROS, Risk Assessments, Wounds Hx, etc.) 1 25 ASSESSMENTS - Wound and Skin Assessment / Reassessment []  - Simple Wound Assessment / Reassessment - one wound 0 X - Complex Wound Assessment / Reassessment - multiple wounds 22 5 []  - Dermatologic / Skin Assessment (not related to wound area) 0 ASSESSMENTS - Ostomy and/or Continence Assessment and Care []  - Incontinence Assessment and Management 0 []  - Ostomy Care Assessment and Management (repouching, etc.) 0 PROCESS - Coordination of Care []  - Simple Patient / Family Education for ongoing care 0 X - Complex (extensive) Patient / Family Education for ongoing care 1 20 X - Staff obtains Programmer, systems, Records, Test Results / Process Orders 1 10 []  - Staff telephones HHA, Nursing Homes / Clarify orders / etc 0 []  - Routine Transfer to another Facility (non-emergent condition) 0 []  - Routine Hospital Admission (non-emergent condition) 0 X - New Admissions / Biomedical engineer / Ordering NPWT, Apligraf, etc. 1 15 []  - Emergency Hospital Admission (emergent condition) 0 X - Simple Discharge Coordination 1 10 TREVONTAE, GLOSS. ([id number]) []  - Complex (extensive) Discharge Coordination 0 PROCESS - Special Needs []  - Pediatric / Minor Patient Management 0 []  - Isolation Patient Management 0 []  - Hearing / Language / Visual special needs 0 []  - Assessment of Community assistance (transportation, D/C planning, etc.) 0 []  - Additional assistance / Altered mentation 0 []  - Support Surface(s)  Assessment (bed, cushion, seat, etc.) 0 INTERVENTIONS - Wound Cleansing / Measurement X - Wound Imaging (photographs - any number of wounds) 1 5 []  - Wound Tracing (instead of photographs) 0 []  - Simple Wound Measurement - one wound 0 X - Complex Wound Measurement - multiple wounds 22 5 []  - Simple  Wound Cleansing - one wound 0 X - Complex Wound Cleansing - multiple wounds 22 5 INTERVENTIONS - Wound Dressings X - Small Wound Dressing one or multiple wounds 22 10 []  - Medium Wound Dressing one or multiple wounds 0 []  - Large Wound Dressing one or multiple wounds 0 []  - Application of Medications - injection 0 INTERVENTIONS - Miscellaneous []  - External ear exam 0 []  - Specimen Collection (cultures, biopsies, blood, body fluids, etc.) 0 []  - Specimen(s) / Culture(s) sent or taken to Lab for analysis 0 []  - Patient Transfer (multiple staff / Harrel Lemon Lift / Similar devices) 0 []  - Simple Staple / Suture removal (25 or less) 0 []  - Complex Staple / Suture removal (26 or more) 0 MYKAIL, STOKELY. ([id number]) []  - Hypo / Hyperglycemic Management (close monitor of Blood Glucose) 0 []  - Ankle / Brachial Index (ABI) - do not check if billed separately 0 Has the patient been seen at the hospital within the last three years: Yes Total Score: 655 Level Of Care: New/Established - Level 5 Electronic Signature(s) Signed: 05/07/2015 5:51:05 PM By: Gretta Cool, RN, BSN, Kim RN, BSN Entered By: Gretta Cool, RN, BSN, Kim on 05/07/2015 11:33:55 [name] ([id number]) -------------------------------------------------------------------------------- Encounter Discharge Information Details Patient Name: [name]. Date of Service: 05/07/2015 9:30 AM Medical Record Number: [id number] Patient Account Number: [id number] Date of Birth/Sex: [birth year] (75 y.o. Male) Treating RN: Ahmed Prima Primary Care Physician: Deland Pretty Other Clinician: Referring Physician: Mauricia Area Treating Physician/Extender:  Frann Rider in Treatment: 0 Encounter Discharge Information Items Discharge Pain Level: 10 Discharge Condition: Stable Ambulatory Status: Ambulatory Other (Note Discharge Destination: Required) Transportation: Private Auto Accompanied By: wife Schedule Follow-up Appointment: Yes Medication Reconciliation completed and provided to Patient/Care Yes Robt Okuda: Provided on Clinical Summary of Care: 05/07/2015 Form Type Recipient Paper Patient KV Electronic Signature(s) Signed: 05/07/2015 11:56:39 AM By: Ruthine Dose Entered By: Ruthine Dose on 05/07/2015 11:56:39 [name] ([id number]) -------------------------------------------------------------------------------- Lower Extremity Assessment Details Patient Name: [name]. Date of Service: 05/07/2015 9:30 AM Medical Record Number: [id number] Patient Account Number: [id number] Date of Birth/Sex: [birth year] (75 y.o. Male) Treating RN: Ahmed Prima Primary Care Physician: Deland Pretty Other Clinician: Referring Physician: Mauricia Area Treating Physician/Extender: Frann Rider in Treatment: 0 Edema Assessment Assessed: [Left: No] [Right: No] Edema: [Left: Yes] [Right: Yes] Calf Left: Right: Point of Measurement: 34 cm From Medial Instep 35 cm 37 cm Ankle Left: Right: Point of Measurement: 11 cm From Medial Instep 24 cm 22.4 cm Vascular Assessment Pulses: Posterior Tibial Dorsalis Pedis Palpable: [Left:No] [Right:No] Doppler: [Left:Multiphasic] [Right:Multiphasic] Extremity colors, hair growth, and conditions: Extremity Color: [Left:Hyperpigmented] [Right:Hyperpigmented] Hair Growth on Extremity: [Left:No] [Right:No] Temperature of Extremity: [Left:Warm] [Right:Warm] Capillary Refill: [Left:< 3 seconds] [Right:< 3 seconds] Toe Nail Assessment Left: Right: Thick: No No Discolored: Yes Yes Deformed: Yes Improper Length and Hygiene: No No Electronic Signature(s) Signed: 05/07/2015 5:37:28  PM By: Alric Quan Entered By: Alric Quan on 05/07/2015 09:59:32 [name] ([id number] [name] ([id number]) -------------------------------------------------------------------------------- Multi Wound Chart Details Patient Name: [name]. Date of Service: 05/07/2015 9:30 AM Medical Record Number: [id number] Patient Account Number: [id number] Date of Birth/Sex: [birth year] (75 y.o. 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] Secondary Etiology: [1:N/A] [10:N/A] [11:N/A] Comorbid History: [1:Cataracts, Chronic Obstructive Pulmonary Disease (COPD), Hypertension, Osteoarthritis, Received Osteoarthritis, Received Osteoarthritis, Received Chemotherapy, Received Chemotherapy, Received Chemotherapy, Received Radiation]  [10:Cataracts, Chronic Obstructive Pulmonary Disease (COPD), Hypertension, Radiation] [11:Cataracts, Chronic Obstructive Pulmonary Disease (COPD), Hypertension, Radiation] Date Acquired: [1:03/07/2015] [10:04/23/2015] [11:04/23/2015] Weeks of Treatment: [1:0] [10:0] [11:0] Wound Status: [1:Open] [10:Open] [11:Open] Measurements L x W x D 7.5x6.5x0.1 [10:2.5x1x0.1] [11:1x1x0.1] (cm) Area (cm) : [1:38.288] [10:1.963] [11:0.785] Volume (cm) : [1:3.829] [10:0.196] [11:0.079] % Reduction in  Area: [1:N/A] [10:N/A] [11:N/A] % Reduction in Volume: N/A [10:N/A] [11:N/A] Classification: [1:Partial Thickness] [10:Partial Thickness] [11:Partial Thickness] Exudate Amount: [1:None Present] [10:None Present] [11:None Present] Exudate Type: [1:N/A] [10:N/A] [11:N/A] Exudate Color: [1:N/A] [10:N/A] [11:N/A] Wound Margin: [1:Flat and Intact] [10:Flat and Intact] [11:Flat and Intact] Granulation Amount: [1:None Present (0%)] [10:None Present (0%)] [11:None Present (0%)] Necrotic Amount: [1:Large (67-100%)] [10:Large (67-100%)] [11:Large (67-100%)] Necrotic Tissue: [1:Eschar] [10:Eschar] [11:Eschar] Exposed Structures: Fascia: No Fascia: No Fascia: No Fat: No Fat: No Fat: No Tendon: No Tendon: No Tendon: No Muscle: No Muscle: No Muscle: No Joint: No Joint: No Joint: No Bone: No Bone: No Bone: No Limited to Skin Limited to Skin Limited to Skin Breakdown Breakdown Breakdown Epithelialization: None None None Periwound Skin Texture: No Abnormalities Noted Edema: Yes Edema: Yes Periwound Skin No Abnormalities Noted No Abnormalities Noted No Abnormalities Noted Moisture: Periwound Skin Color: No Abnormalities Noted No Abnormalities Noted No Abnormalities Noted Temperature: No Abnormality No Abnormality N/A Tenderness on No No No Palpation: Wound Preparation: Ulcer Cleansing: Ulcer Cleansing: Not Ulcer Cleansing: Not Rinsed/Irrigated with Cleansed Cleansed Saline Topical Anesthetic Applied: None Wound Number: [id number]  Photos: No Photos No Photos No Photos Wound Location: Left Toe Fourth Left Toe Fourth Left Toe Fourth Wounding Event: Gradually Appeared Gradually Appeared Gradually Appeared 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 Secondary Etiology: N/A N/A N/A Comorbid History: Cataracts, Chronic N/A N/A Obstructive Pulmonary Disease (COPD), Hypertension, Osteoarthritis, Received Chemotherapy,  Received Radiation Date Acquired: 04/23/2015 04/23/2015 04/23/2015 Weeks of Treatment: 0 0 0 Wound Status: Open Healed - Epithelialized Healed - Epithelialized Measurements L x W x D 0.5x0.7x0.1 0x0x0 0x0x0 (cm) Area (cm) : 0.275 0 0 Volume (cm) : 0.027 0 0 % Reduction in Area: N/A N/A N/A % Reduction in Volume: N/A N/A N/A Classification: Partial Thickness N/A N/A Exudate Amount: None Present N/A N/A Exudate Type: N/A N/A N/A Exudate Color: N/A N/A N/A Wound Margin: Flat and Intact N/A N/A BRODEN, MAZIN ([id number]) Granulation Amount: None Present (0%) N/A N/A Necrotic Amount: Large (67-100%) N/A N/A Necrotic Tissue: Eschar N/A N/A Exposed Structures: Fascia: No N/A N/A Fat: No Tendon: No Muscle: No Joint: No Bone: No Limited to Skin Breakdown Epithelialization: None N/A N/A Periwound Skin Texture: Edema: Yes No Abnormalities Noted No Abnormalities Noted Periwound Skin Maceration: No No Abnormalities Noted No Abnormalities Noted Moisture: Moist: No Dry/Scaly: No Periwound Skin Color: No Abnormalities Noted No Abnormalities Noted No Abnormalities Noted Temperature: N/A N/A N/A Tenderness on No No No Palpation: Wound Preparation: Ulcer Cleansing: Not N/A N/A Cleansed Topical Anesthetic Applied: None Wound Number: [id number]  Photos: No Photos No Photos No Photos Wound Location: Left Toe Fifth Left Calcaneus Left Lower Leg - Posterior Wounding Event: Gradually Appeared Gradually Appeared Gradually Appeared 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  Arterial Insufficiency Ulcer Secondary Etiology: N/A N/A N/A Comorbid History: Cataracts, Chronic Cataracts, Chronic Cataracts, Chronic Obstructive Pulmonary Obstructive Pulmonary Obstructive Pulmonary Disease (COPD), Disease (COPD), Disease (COPD), Hypertension, Hypertension, Hypertension, Osteoarthritis, Received Osteoarthritis, Received Osteoarthritis, Received Chemotherapy,  Received Chemotherapy, Received Chemotherapy, Received Radiation Radiation Radiation Date Acquired: 04/23/2015 04/23/2015 04/23/2015 Weeks of Treatment: 0 0 0 Wound Status: Open Open Open Measurements L x W x D 4.5x7x0.1 4.3x3x0.1 7x1x0.1 (cm) Area (cm) : 24.74 10.132 5.498 Volume (cm) : 2.474 1.013 0.55 % Reduction in Area: N/A N/A N/A % Reduction in Volume: N/A N/A N/A Classification: Partial Thickness Partial Thickness Partial Thickness DEVANTE, PENNELLA ([id number]) Exudate Amount: None Present None Present Medium Exudate Type: N/A N/A Serous Exudate Color: N/A N/A amber Wound Margin: Flat and Intact Flat and Intact Flat and Intact Granulation Amount: None Present (0%) None Present (0%) None Present (0%) Necrotic Amount: Large (67-100%) Large (67-100%) Large (67-100%) Necrotic Tissue: Eschar Eschar Eschar, Adherent Slough Exposed Structures: Fascia: No Fascia: No N/A Fat: No Fat: No Tendon: No Tendon: No Muscle: No Muscle: No Joint: No Joint: No Bone: No Bone: No Limited to Skin Limited to Skin Breakdown Breakdown Epithelialization: None None None Periwound Skin Texture: Edema: Yes Edema: Yes Edema: Yes Periwound Skin No Abnormalities Noted No Abnormalities Noted Moist: Yes Moisture: Periwound Skin Color: No Abnormalities Noted No Abnormalities Noted No Abnormalities Noted Temperature: N/A No Abnormality No Abnormality Tenderness on No No Yes Palpation: Wound Preparation: Ulcer Cleansing: Not Ulcer Cleansing: Not Ulcer Cleansing: Cleansed Cleansed Rinsed/Irrigated with Saline Topical Anesthetic Topical Anesthetic Applied: None Applied: None Wound Number: [id number]  Photos: No Photos No Photos No Photos Wound Location: Left Knee Right Hand - 2nd Digit Right Hand - 3rd Digit Wounding Event: Gradually Appeared Gradually Appeared Gradually Appeared 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 Secondary  Etiology: N/A N/A N/A Comorbid History: Cataracts, Chronic Cataracts, Chronic Cataracts, Chronic Obstructive Pulmonary Obstructive Pulmonary Obstructive Pulmonary Disease (COPD), Disease (COPD), Disease (COPD), Hypertension, Hypertension, Hypertension, Osteoarthritis, Received Osteoarthritis, Received Osteoarthritis, Received Chemotherapy, Received Chemotherapy, Received Chemotherapy, Received Radiation Radiation Radiation Date Acquired: 04/23/2015 03/02/2015 04/23/2015 Weeks of Treatment: 0 0 0 Wound Status: Open Open Open Measurements L x W x D 3x1.4x0.1 5.8x2.5x0.1 0.9x0.7x0.1 (cm) Area (cm) : 3.299 11.388 0.495 Volume (cm) : 0.33 1.139 0.049 KELCY, STERCHI. ([id number]) % Reduction in Area: N/A N/A N/A % Reduction in Volume: N/A N/A N/A Classification: Partial Thickness Full Thickness Without Partial Thickness Exposed Support Structures Exudate Amount: None Present None Present None Present Exudate Type: N/A N/A N/A Exudate Color: N/A N/A N/A Wound Margin: Flat and Intact Flat and Intact Flat and Intact Granulation Amount: None Present (0%) None Present (0%) None Present (0%) Necrotic Amount: Large (67-100%) Large (67-100%) Large (67-100%) Necrotic Tissue: Eschar Eschar Eschar Exposed Structures: Fascia: No Fascia: No N/A Fat: No Fat: No Tendon: No Tendon: No Muscle: No Muscle: No Joint: No Joint: No Bone: No Bone: No Limited to Skin Limited to Skin Breakdown Breakdown Epithelialization: None N/A None Periwound Skin Texture: No Abnormalities Noted Edema: No Edema: Yes Excoriation: No Induration: No Callus: No Crepitus: No Fluctuance: No Friable: No Rash: No Scarring: No Periwound Skin Maceration: No Maceration: No Maceration: No Moisture: Moist: No Moist: No Moist: No Dry/Scaly: No Dry/Scaly: No Dry/Scaly: No Periwound Skin Color: No Abnormalities Noted Atrophie Blanche: No No Abnormalities Noted Cyanosis: No Ecchymosis: No Erythema:  No Hemosiderin Staining: No Mottled: No Pallor: No Rubor: No Temperature: No Abnormality N/A No Abnormality Tenderness  on Yes No Yes Palpation: Wound Preparation: Ulcer Cleansing: Not Ulcer Cleansing: N/A Cleansed Rinsed/Irrigated with Saline Topical Anesthetic Applied: None Topical Anesthetic Applied: None FRANKLYN, BARSH ([id number]) Wound Number: [id number]  Photos: No Photos No Photos No Photos Wound Location: Right Hand - 5th Digit Right Toe Second - Left Hand - 1st Digit Circumfernential Wounding Event: Gradually Appeared Gradually Appeared Gradually Appeared 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 Secondary Etiology: N/A N/A N/A Comorbid History: Cataracts, Chronic Cataracts, Chronic Cataracts, Chronic Obstructive Pulmonary Obstructive Pulmonary Obstructive Pulmonary Disease (COPD), Disease (COPD), Disease (COPD), Hypertension, Hypertension, Hypertension, Osteoarthritis, Received Osteoarthritis, Received Osteoarthritis, Received Chemotherapy, Received Chemotherapy, Received Chemotherapy, Received Radiation Radiation Radiation Date Acquired: 04/23/2015 03/07/2015 04/23/2015 Weeks of Treatment: 0 0 0 Wound Status: Open Open Open Measurements L x W x D 2x1.4x0.1 5.6x5x0.1 0.6x0.5x0.1 (cm) Area (cm) : 2.199 21.991 0.236 Volume (cm) : 0.22 2.199 0.024 % Reduction in Area: N/A N/A N/A % Reduction in Volume: N/A N/A N/A Classification: Partial Thickness Partial Thickness Full Thickness Without Exposed Support Structures Exudate Amount: None Present None Present Small Exudate Type: N/A N/A Sanguinous Exudate Color: N/A N/A red Wound Margin: N/A Flat and Intact Indistinct, nonvisible Granulation Amount: None Present (0%) None Present (0%) Small (1-33%) Necrotic Amount: Large (67-100%) Large (67-100%) None Present (0%) Necrotic Tissue: Eschar Eschar N/A Exposed Structures: Fascia: No Fascia: No Fascia:  No Fat: No Fat: No Fat: No Tendon: No Tendon: No Tendon: No Muscle: No Muscle: No Muscle: No Joint: No Joint: No Joint: No Bone: No Bone: No Bone: No Limited to Skin Limited to Skin Limited to Skin Breakdown Breakdown Breakdown Epithelialization: None None N/A Periwound Skin Texture: Edema: Yes Edema: Yes Edema: No Excoriation: No Induration: No Callus: No Crepitus: No Fluctuance: No Friable: No JOHNNIE, ZEIMET ([id number]) Rash: No Scarring: No Periwound Skin Maceration: No No Abnormalities Noted Moist: Yes Moisture: Moist: No Maceration: No Dry/Scaly: No Dry/Scaly: No Periwound Skin Color: No Abnormalities Noted No Abnormalities Noted Atrophie Blanche: No Cyanosis: No Ecchymosis: No Erythema: No Hemosiderin Staining: No Mottled: No Pallor: No Rubor: No Temperature: No Abnormality No Abnormality N/A Tenderness on Yes No No Palpation: Wound Preparation: Ulcer Cleansing: Not Ulcer Cleansing: Ulcer Cleansing: Cleansed Rinsed/Irrigated with Rinsed/Irrigated with Saline Saline Topical Anesthetic Applied: None Topical Anesthetic Applied: None Wound Number: [id number]  Photos: No Photos No Photos No Photos Wound Location: Left Hand - 1st Digit Left Hand - 3rd Digit Left Hand - 4th Digit Wounding Event: Gradually Appeared Gradually Appeared Gradually Appeared 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 Secondary Etiology: N/A N/A N/A Comorbid History: N/A Cataracts, Chronic Cataracts, Chronic Obstructive Pulmonary Obstructive Pulmonary Disease (COPD), Disease (COPD), Hypertension, Hypertension, Osteoarthritis, Received Osteoarthritis, Received Chemotherapy, Received Chemotherapy, Received Radiation Radiation Date Acquired: 04/23/2015 04/23/2015 03/02/2015 Weeks of Treatment: 0 0 0 Wound Status: Healed - Epithelialized Open Open Measurements L x W x D 0x0x0 5x2x0.1 0.8x1.3x0.1 (cm) Area (cm) :  0 7.854 0.817 Volume (cm) : 0 0.785 0.082 % Reduction in Area: N/A N/A 0.00% % Reduction in Volume: N/A N/A 0.00% Classification: N/A Partial Thickness Full Thickness Without Exposed Support Structures Exudate Amount: N/A None Present None Present Exudate Type: N/A N/A N/A JONATHEN, CALLUM ([id number]) Exudate Color: N/A N/A N/A Wound Margin: N/A Flat and Intact Flat and Intact Granulation Amount: N/A None Present (0%) None Present (0%) Necrotic Amount: N/A Large (67-100%) Large (67-100%) Necrotic Tissue: N/A Eschar Eschar Exposed Structures: N/A Fascia: No Fascia: No  Fat: No Fat: No Tendon: No Tendon: No Muscle: No Muscle: No Joint: No Joint: No Bone: No Bone: No Limited to Skin Limited to Skin Breakdown Breakdown Epithelialization: N/A None N/A Periwound Skin Texture: No Abnormalities Noted No Abnormalities Noted Edema: No Excoriation: No Induration: No Callus: No Crepitus: No Fluctuance: No Friable: No Rash: No Scarring: No Periwound Skin No Abnormalities Noted Maceration: No Maceration: No Moisture: Moist: No Moist: No Dry/Scaly: No Dry/Scaly: No Periwound Skin Color: No Abnormalities Noted No Abnormalities Noted Atrophie Blanche: No Cyanosis: No Ecchymosis: No Erythema: No Hemosiderin Staining: No Mottled: No Pallor: No Rubor: No Temperature: N/A No Abnormality N/A Tenderness on No Yes No Palpation: Wound Preparation: N/A Ulcer Cleansing: Not N/A Cleansed Topical Anesthetic Applied: None Wound Number: [id number]  Photos: No Photos No Photos No Photos Wound Location: Right Toe Third Right Toe Fifth - Medial Right Metatarsal head first Wounding Event: Gradually Appeared Gradually Appeared Gradually Appeared 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 ([id number]) Cataracts, Chronic Cataracts, Chronic Cataracts,  Chronic Obstructive Pulmonary Obstructive Pulmonary Obstructive Pulmonary Disease (COPD), Disease (COPD), Disease (COPD), Hypertension, Hypertension, Hypertension, Osteoarthritis, Received Osteoarthritis, Received Osteoarthritis, Received Chemotherapy, Received Chemotherapy, Received Chemotherapy, Received Radiation Radiation Radiation Date Acquired: 04/23/2015 04/23/2015 04/23/2015 Weeks of Treatment: 0 0 0 Wound Status: Open Open Open Measurements L x W x D 1.5x1.7x0.1 0.9x0.5x0.1 1.5x2.2x0.1 (cm) Area (cm) : 2.003 0.353 2.592 Volume (cm) : 0.2 0.035 0.259 % Reduction in Area: N/A N/A N/A % Reduction in Volume: N/A N/A N/A Classification: Full Thickness Without Partial Thickness Partial Thickness Exposed Support Structures Exudate Amount: None Present None Present None Present Exudate Type: N/A N/A N/A Exudate Color: N/A N/A N/A Wound Margin: Flat and Intact Flat and Intact Flat and Intact Granulation Amount: None Present (0%) None Present (0%) None Present (0%) Necrotic Amount: Large (67-100%) Large (67-100%) Large (67-100%) Necrotic Tissue: Eschar Eschar Eschar Exposed Structures: Fascia: No Fascia: No Fascia: No Fat: No Fat: No Fat: No Tendon: No Tendon: No Tendon: No Muscle: No Muscle: No Muscle: No Joint: No Joint: No Joint: No Bone: No Bone: No Bone: No Limited to Skin Limited to Skin Limited to Skin Breakdown Breakdown Breakdown Epithelialization: None None None Periwound Skin Texture: Edema: Yes Edema: Yes Edema: Yes Periwound Skin No Abnormalities Noted No Abnormalities Noted No Abnormalities Noted Moisture: Periwound Skin Color: No Abnormalities Noted No Abnormalities Noted No Abnormalities Noted Temperature: No Abnormality No Abnormality No Abnormality Tenderness on No No No Palpation: Wound Preparation: Ulcer Cleansing: Ulcer Cleansing: Ulcer Cleansing: Rinsed/Irrigated with Rinsed/Irrigated with Rinsed/Irrigated with Saline Saline  Saline Wound Number: [id number]  Photos: No Photos No Photos No Photos Wound Location: Right Calcaneus Right Malleolus THANG, CRAVER ([id number]) Right Lower Leg - Posterior Wounding Event: Gradually Appeared Gradually Appeared Gradually Appeared 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 N/A N/A Comorbid History: Cataracts, Chronic Cataracts, Chronic Cataracts, Chronic Obstructive Pulmonary Obstructive Pulmonary Obstructive Pulmonary Disease (COPD), Disease (COPD), Disease (COPD), Hypertension, Hypertension, Hypertension, Osteoarthritis, Received Osteoarthritis, Received Osteoarthritis, Received Chemotherapy, Received Chemotherapy, Received Chemotherapy, Received Radiation Radiation Radiation Date Acquired: 04/23/2015 04/23/2015 04/23/2015 Weeks of Treatment: 0 0 0 Wound Status: Open Open Open Measurements L x W x D 3x5.5x0.1 3.2x4x0.1 9x3x0.1 (cm) Area (cm) : 12.959 10.053 21.206 Volume (cm) : 1.296 1.005 2.121 % Reduction in Area: N/A N/A 0.00% % Reduction in Volume: N/A N/A 0.00% Classification: Partial Thickness Partial Thickness  Partial Thickness Exudate Amount: None Present None Present None Present Exudate Type: N/A N/A N/A Exudate Color: N/A N/A N/A Wound Margin: N/A Flat and Intact Flat and Intact Granulation Amount: None Present (0%) None Present (0%) None Present (0%) Necrotic Amount: Large (67-100%) Large (67-100%) Large (67-100%) Necrotic Tissue: Eschar Eschar Eschar, Adherent Slough Exposed Structures: Fascia: No Fascia: No Fascia: No Fat: No Fat: No Fat: No Tendon: No Tendon: No Tendon: No Muscle: No Muscle: No Muscle: No Joint: No Joint: No Joint: No Bone: No Bone: No Bone: No Limited to Skin Limited to Skin Limited to Skin Breakdown Breakdown Breakdown Epithelialization: None None None Periwound Skin Texture: Edema: Yes No Abnormalities Noted Edema:  Yes Rash: No Periwound Skin Maceration: No No Abnormalities Noted No Abnormalities Noted Moisture: Moist: No Dry/Scaly: No Periwound Skin Color: No Abnormalities Noted No Abnormalities Noted No Abnormalities Noted Temperature: No Abnormality N/A No Abnormality Tenderness on No Yes Yes Palpation: Wound Preparation: N/A [name] ([id number]) Ulcer Cleansing: Ulcer Cleansing: Rinsed/Irrigated with Rinsed/Irrigated with Saline Saline Wound Number: 9 N/A N/A Photos: No Photos N/A N/A Wound Location: Left Toe Great N/A N/A Wounding Event: Gradually Appeared N/A N/A Primary Etiology: Arterial Insufficiency Ulcer N/A N/A Secondary Etiology: N/A N/A N/A Comorbid History: Cataracts, Chronic N/A N/A Obstructive Pulmonary Disease (COPD), Hypertension, Osteoarthritis, Received Chemotherapy, Received Radiation Date Acquired: 04/23/2015 N/A N/A Weeks of Treatment: 0 N/A N/A Wound Status: Open N/A N/A Measurements L x W x D 3.5x4.5x0.1 N/A N/A (cm) Area (cm) : 12.37 N/A N/A Volume (cm) : 1.237 N/A N/A % Reduction in Area: N/A N/A N/A % Reduction in Volume: N/A N/A N/A Classification: Partial Thickness N/A N/A Exudate Amount: None Present N/A N/A Exudate Type: N/A N/A N/A Exudate Color: N/A N/A N/A Wound Margin: N/A N/A N/A Granulation Amount: None Present (0%) N/A N/A Necrotic Amount: Large (67-100%) N/A N/A Necrotic Tissue: Eschar N/A N/A Exposed Structures: Fascia: No N/A N/A Fat: No Tendon: No Muscle: No Joint: No Bone: No Limited to Skin Breakdown Epithelialization: None N/A N/A Periwound Skin Texture: Edema: Yes N/A N/A Periwound Skin No Abnormalities Noted N/A N/A Moisture: Periwound Skin Color: No Abnormalities Noted N/A N/A Temperature: No Abnormality N/A N/A No N/A N/A REZNOR, BANKA ([id number]) Tenderness on Palpation: Wound Preparation: N/A N/A N/A Treatment Notes Electronic Signature(s) Signed: 05/07/2015 5:37:28 PM By: Alric Quan Entered By: Alric Quan on 05/07/2015 11:06:44 [name] ([id number]) -------------------------------------------------------------------------------- [place] Details Patient Name: [name]. Date of Service: 05/07/2015 9:30 AM Medical Record Number: [id number] Patient Account Number: [id number] Date of Birth/Sex: [birth year] (75 y.o. Male) Treating RN: Ahmed Prima Primary Care Physician: Deland Pretty Other Clinician: Referring Physician: Mauricia Area Treating Physician/Extender: Frann Rider in Treatment: 0 Active Inactive Abuse / Safety / Falls / Self Care Management Nursing Diagnoses: Potential for falls Goals: Patient will remain injury free Date Initiated: 05/07/2015 Goal Status: Active Interventions: Provide education on fall prevention Notes: Necrotic Tissue Nursing Diagnoses: Impaired tissue integrity related to necrotic/devitalized tissue Knowledge deficit related to management of necrotic/devitalized tissue Goals: Necrotic/devitalized tissue will be minimized in the wound bed Date Initiated: 05/07/2015 Goal Status: Active Interventions: Provide education on necrotic tissue and debridement process Treatment Activities: Apply topical anesthetic as ordered : 05/07/2015 Notes: Nutrition Nursing Diagnoses: Potential for alteratiion in Nutrition/Potential for imbalanced nutrition AB, ELLERBE ([id number]) Goals: Patient/caregiver agrees to and verbalizes understanding of need to obtain nutritional consultation Date Initiated: 05/07/2015 Goal Status: Active Interventions: Assess patient nutrition upon admission and as  needed per policy Notes: Orientation to the Wound Care Program Nursing Diagnoses: Knowledge deficit related to the wound healing center program Goals: Patient/caregiver will verbalize understanding of the [place] Program Date Initiated: 05/07/2015 Goal Status:  Active Interventions: Provide education on orientation to the wound center Notes: Soft Tissue Infection Nursing Diagnoses: Potential for infection: soft tissue Goals: Patient will remain free of wound infection Date Initiated: 05/07/2015 Goal Status: Active Interventions: Assess signs and symptoms of infection every visit Notes: Wound/Skin Impairment Nursing Diagnoses: Impaired tissue integrity Goals: Ulcer/skin breakdown will heal within 14 weeks SUMMER, HAENEL ([id number]) Date Initiated: 05/07/2015 Goal Status: Active Interventions: Assess patient/caregiver ability to obtain necessary supplies Notes: Electronic Signature(s) Signed: 05/07/2015 5:37:28 PM By: Alric Quan Entered By: Alric Quan on 05/07/2015 11:06:26 [name] ([id number]) -------------------------------------------------------------------------------- Pain Assessment Details Patient Name: [name]. Date of Service: 05/07/2015 9:30 AM Medical Record Number: [id number] Patient Account Number: [id number] Date of Birth/Sex: 1941-02-16 (75 y.o. Male) Treating RN: Ahmed Prima Primary Care Physician: Deland Pretty Other Clinician: Referring Physician: Mauricia Area Treating Physician/Extender: Frann Rider in Treatment: 0 Active Problems Location of Pain Severity and Description of Pain Patient Has Paino Yes Site Locations Pain Location: Pain in Ulcers With Dressing Change: Yes Duration of the Pain. Constant / Intermittento Constant Rate the pain. Current Pain Level: 10 Character of Pain Describe the Pain: Throbbing Pain Management and Medication Current Pain Management: Electronic Signature(s) Signed: 05/07/2015 5:37:28 PM By: Alric Quan Entered By: Alric Quan on 05/07/2015 09:49:59 [name] ([id number]) -------------------------------------------------------------------------------- Patient/Caregiver Education Details Patient Name: [name]. Date of Service: 05/07/2015 9:30 AM Medical Record Number: [id number] Patient Account Number: [id number] Date of Birth/Gender: March 12, 1941 (75 y.o. Male) Treating RN: Ahmed Prima Primary Care Physician: Deland Pretty Other Clinician: Referring Physician: Mauricia Area Treating Physician/Extender: Frann Rider in Treatment: 0 Education Assessment Education Provided To: Patient and Caregiver wife Education Topics Provided Wound/Skin Impairment: Handouts: Other: change dressings as directed and keep appts Methods: Demonstration, Explain/Verbal Responses: State content correctly Electronic Signature(s) Signed: 05/07/2015 5:37:28 PM By: Alric Quan Entered By: Alric Quan on 05/07/2015 11:40:04 [name] ([id number]) -------------------------------------------------------------------------------- Wound Assessment Details Patient Name: [name]. Date of Service: 05/07/2015 9:30 AM Medical Record Number: [id number] Patient Account Number: [id number] Date of Birth/Sex: 05-Nov-1940 (75 y.o. Male) Treating RN: Ahmed Prima Primary Care Physician: Deland Pretty Other Clinician: Referring Physician: Mauricia Area Treating Physician/Extender: Frann Rider in Treatment: 0 Wound Status Wound Number: 1 Primary Arterial Insufficiency Ulcer Etiology: Wound Location: Right Toe Great - Circumfernential Wound Open Status: Wounding Event: Gradually Appeared Comorbid Cataracts, Chronic Obstructive Date Acquired: 03/07/2015 History: Pulmonary Disease (COPD), Weeks Of Treatment: 0 Hypertension, Osteoarthritis, Received Clustered Wound: No Chemotherapy, Received Radiation Photos Photo Uploaded By: Gretta Cool, RN, BSN, Kim on 05/07/2015 17:20:44 Wound Measurements Length: (cm) 7.5 Width: (cm) 6.5 Depth: (cm) 0.1 Area: (cm) 38.288 Volume: (cm) 3.829 % Reduction in Area: % Reduction in Volume: Epithelialization: None Tunneling: No Undermining:  No Wound Description Classification: Partial Thickness Wound Margin: Flat and Intact Exudate Amount: None Present Foul Odor After Cleansing: No Wound Bed Granulation Amount: None Present (0%) Exposed Structure Necrotic Amount: Large (67-100%) Fascia Exposed: No Necrotic Quality: Eschar Fat Layer Exposed: No Tendon Exposed: No Muscle Exposed: No [name] ([id number]) Joint Exposed: No Bone Exposed: No Limited to Skin Breakdown Periwound Skin Texture Texture Color No Abnormalities Noted: No No Abnormalities Noted: No Moisture Temperature / Pain No Abnormalities Noted: No Temperature: No Abnormality Wound Preparation  Ulcer Cleansing: Rinsed/Irrigated with Saline Treatment Notes Wound #1 (Right, Circumferential Toe Great) 1. Cleansed with: Clean wound with Normal Saline 4. Dressing Applied: Other dressing (specify in notes) 5. Secondary Dressing Applied Kerlix/Conform 7. Secured with Paper tape Notes Painted wounds with betadine Electronic Signature(s) Signed: 05/07/2015 5:37:28 PM By: Alric Quan Entered By: Alric Quan on 05/07/2015 10:24:59 [name] ([id number]) -------------------------------------------------------------------------------- Wound Assessment Details Patient Name: [name]. Date of Service: 05/07/2015 9:30 AM Medical Record Number: [id number] Patient Account Number: [id number] Date of Birth/Sex: 14-Jan-1941 (75 y.o. Male) Treating RN: Ahmed Prima Primary Care Physician: Deland Pretty Other Clinician: Referring Physician: Mauricia Area Treating Physician/Extender: Frann Rider in Treatment: 0 Wound Status Wound Number: 10 Primary Arterial Insufficiency Ulcer Etiology: Wound Location: Left Toe Second Wound Open Wounding Event: Gradually Appeared Status: Date Acquired: 04/23/2015 Comorbid Cataracts, Chronic Obstructive Weeks Of Treatment: 0 History: Pulmonary Disease (COPD), Clustered Wound:  No Hypertension, Osteoarthritis, Received Chemotherapy, Received Radiation Photos Photo Uploaded By: Gretta Cool, RN, BSN, Kim on 05/07/2015 17:20:44 Wound Measurements Length: (cm) 2.5 Width: (cm) 1 Depth: (cm) 0.1 Area: (cm) 1.963 Volume: (cm) 0.196 % Reduction in Area: % Reduction in Volume: Epithelialization: None Tunneling: No Undermining: No Wound Description Classification: Partial Thickness Wound Margin: Flat and Intact Exudate Amount: None Present Foul Odor After Cleansing: No Wound Bed Granulation Amount: None Present (0%) Exposed Structure Necrotic Amount: Large (67-100%) Fascia Exposed: No Necrotic Quality: Eschar Fat Layer Exposed: No Tendon Exposed: No Muscle Exposed: No TAVAUGHN, LISIECKI ([id number]) Joint Exposed: No Bone Exposed: No Limited to Skin Breakdown Periwound Skin Texture Texture Color No Abnormalities Noted: No No Abnormalities Noted: No Localized Edema: Yes Temperature / Pain Moisture Temperature: No Abnormality No Abnormalities Noted: Yes Wound Preparation Ulcer Cleansing: Not Cleansed Treatment Notes Wound #10 (Left Toe Second) 1. Cleansed with: Clean wound with Normal Saline 4. Dressing Applied: Aquacel Ag 5. Secondary Dressing Applied Gauze and Kerlix/Conform 7. Secured with Recruitment consultant) Signed: 05/07/2015 5:37:28 PM By: Alric Quan Entered By: Alric Quan on 05/07/2015 10:39:32 [name] ([id number]) -------------------------------------------------------------------------------- Wound Assessment Details Patient Name: [name]. Date of Service: 05/07/2015 9:30 AM Medical Record Number: [id number] Patient Account Number: [id number] Date of Birth/Sex: [birth year] (75 y.o. Male) Treating RN: Ahmed Prima Primary Care Physician: Deland Pretty Other Clinician: Referring Physician: Mauricia Area Treating Physician/Extender: Frann Rider in Treatment: 0 Wound Status Wound Number:  11 Primary Arterial Insufficiency Ulcer Etiology: Wound Location: Left Toe Third Wound Open Wounding Event: Gradually Appeared Status: Date Acquired: 04/23/2015 Comorbid Cataracts, Chronic Obstructive Weeks Of Treatment: 0 History: Pulmonary Disease (COPD), Clustered Wound: No Hypertension, Osteoarthritis, Received Chemotherapy, Received Radiation Photos Photo Uploaded By: Gretta Cool, RN, BSN, Kim on 05/07/2015 17:21:11 Wound Measurements Length: (cm) 1 Width: (cm) 1 Depth: (cm) 0.1 Area: (cm) 0.785 Volume: (cm) 0.079 % Reduction in Area: % Reduction in Volume: Epithelialization: None Tunneling: No Undermining: No Wound Description Classification: Partial Thickness Wound Margin: Flat and Intact Exudate Amount: None Present Foul Odor After Cleansing: No Wound Bed Granulation Amount: None Present (0%) Exposed Structure Necrotic Amount: Large (67-100%) Fascia Exposed: No Necrotic Quality: Eschar Fat Layer Exposed: No Tendon Exposed: No Muscle Exposed: No SASHA, HAUER ([id number]) Joint Exposed: No Bone Exposed: No Limited to Skin Breakdown Periwound Skin Texture Texture Color No Abnormalities Noted: No No Abnormalities Noted: No Localized Edema: Yes Moisture No Abnormalities Noted: No Wound Preparation Ulcer Cleansing: Not Cleansed Topical Anesthetic Applied: None Treatment Notes Wound #11 (Left Toe Third) 1. Cleansed with: Clean wound with  Normal Saline 4. Dressing Applied: Other dressing (specify in notes) 5. Secondary Dressing Applied Kerlix/Conform 7. Secured with Paper tape Notes Painted wounds with betadine Electronic Signature(s) Signed: 05/07/2015 5:37:28 PM By: Alric Quan Entered By: Alric Quan on 05/07/2015 10:40:40 [name] ([id number]) -------------------------------------------------------------------------------- Wound Assessment Details Patient Name: [name]. Date of Service: 05/07/2015 9:30 AM Medical  Record Number: [id number] Patient Account Number: [id number] Date of Birth/Sex: 11/16/1940 (75 y.o. Male) Treating RN: Ahmed Prima Primary Care Physician: Deland Pretty Other Clinician: Referring Physician: Mauricia Area Treating Physician/Extender: Frann Rider in Treatment: 0 Wound Status Wound Number: 12 Primary Arterial Insufficiency Ulcer Etiology: Wound Location: Left Toe Fourth Wound Open Wounding Event: Gradually Appeared Status: Date Acquired: 04/23/2015 Comorbid Cataracts, Chronic Obstructive Weeks Of Treatment: 0 History: Pulmonary Disease (COPD), Clustered Wound: No Hypertension, Osteoarthritis, Received Chemotherapy, Received Radiation Photos Photo Uploaded By: Gretta Cool, RN, BSN, Kim on 05/07/2015 17:21:12 Wound Measurements Length: (cm) 0.5 Width: (cm) 0.7 Depth: (cm) 0.1 Area: (cm) 0.275 Volume: (cm) 0.027 % Reduction in Area: % Reduction in Volume: Epithelialization: None Tunneling: No Undermining: No Wound Description Classification: Partial Thickness Wound Margin: Flat and Intact Exudate Amount: None Present Foul Odor After Cleansing: No Wound Bed Granulation Amount: None Present (0%) Exposed Structure Necrotic Amount: Large (67-100%) Fascia Exposed: No Necrotic Quality: Eschar Fat Layer Exposed: No Tendon Exposed: No Muscle Exposed: No FORRESTER, COTRELL ([id number]) Joint Exposed: No Bone Exposed: No Limited to Skin Breakdown Periwound Skin Texture Texture Color No Abnormalities Noted: No No Abnormalities Noted: No Localized Edema: Yes Moisture No Abnormalities Noted: No Dry / Scaly: No Maceration: No Moist: No Wound Preparation Ulcer Cleansing: Not Cleansed Topical Anesthetic Applied: None Treatment Notes Wound #12 (Left Toe Fourth) 1. Cleansed with: Clean wound with Normal Saline 4. Dressing Applied: Other dressing (specify in notes) 5. Secondary Dressing Applied Kerlix/Conform 7. Secured with Paper  tape Notes Painted wounds with betadine Electronic Signature(s) Signed: 05/07/2015 5:37:28 PM By: Alric Quan Entered By: Alric Quan on 05/07/2015 10:42:08 [name] ([id number]) -------------------------------------------------------------------------------- Wound Assessment Details Patient Name: [name]. Date of Service: 05/07/2015 9:30 AM Medical Record Number: [id number] Patient Account Number: [id number] Date of Birth/Sex: Oct 25, 1940 (75 y.o. Male) Treating RN: Ahmed Prima Primary Care Physician: Deland Pretty Other Clinician: Referring Physician: Mauricia Area Treating Physician/Extender: Frann Rider in Treatment: 0 Wound Status Wound Number: 12 Primary Etiology: Arterial Insufficiency Ulcer Wound Location: Left Toe Fourth Wound Status: Healed - Epithelialized Wounding Event: Gradually Appeared Date Acquired: 04/23/2015 Weeks Of Treatment: 0 Clustered Wound: No Wound Measurements Length: (cm) Width: (cm) Depth: (cm) Area: (cm) Volume: (cm) 0 % Reduction in Area: 0 % Reduction in Volume: 0 0 0 Periwound Skin Texture Texture Color No Abnormalities Noted: No No Abnormalities Noted: No Moisture No Abnormalities Noted: No Electronic Signature(s) Signed: 05/07/2015 5:37:28 PM By: Alric Quan Entered By: Alric Quan on 05/07/2015 10:42:47 [name] ([id number]) -------------------------------------------------------------------------------- Wound Assessment Details Patient Name: [name]. Date of Service: 05/07/2015 9:30 AM Medical Record Number: [id number] Patient Account Number: [id number] Date of Birth/Sex: 08-14-1940 (75 y.o. Male) Treating RN: Ahmed Prima Primary Care Physician: Deland Pretty Other Clinician: Referring Physician: Mauricia Area Treating Physician/Extender: Frann Rider in Treatment: 0 Wound Status Wound Number: 25 Primary Arterial Insufficiency  Ulcer Etiology: Wound Location: Left Toe Fifth Wound Open Wounding Event: Gradually Appeared Status: Date Acquired: 04/23/2015 Comorbid Cataracts, Chronic Obstructive Weeks Of Treatment: 0 History: Pulmonary Disease (COPD), Clustered Wound: No Hypertension, Osteoarthritis, Received Chemotherapy, Received Radiation Photos Photo Uploaded  ByGretta Cool, RN, BSN, Kim on 05/07/2015 17:22:39 Wound Measurements Length: (cm) 4.5 Width: (cm) 7 Depth: (cm) 0.1 Area: (cm) 24.74 Volume: (cm) 2.474 % Reduction in Area: % Reduction in Volume: Epithelialization: None Tunneling: No Undermining: No Wound Description Classification: Partial Thickness Wound Margin: Flat and Intact Exudate Amount: None Present Foul Odor After Cleansing: No Wound Bed Granulation Amount: None Present (0%) Exposed Structure Necrotic Amount: Large (67-100%) Fascia Exposed: No Necrotic Quality: Eschar Fat Layer Exposed: No Tendon Exposed: No Muscle Exposed: No DAYLIN, VOGLER ([id number]) Joint Exposed: No Bone Exposed: No Limited to Skin Breakdown Periwound Skin Texture Texture Color No Abnormalities Noted: No No Abnormalities Noted: No Localized Edema: Yes Moisture No Abnormalities Noted: Yes Wound Preparation Ulcer Cleansing: Not Cleansed Topical Anesthetic Applied: None Treatment Notes Wound #13 (Left Toe Fifth) 1. Cleansed with: Clean wound with Normal Saline 4. Dressing Applied: Other dressing (specify in notes) 5. Secondary Dressing Applied Kerlix/Conform 7. Secured with Paper tape Notes Painted wounds with betadine Electronic Signature(s) Signed: 05/07/2015 5:37:28 PM By: Alric Quan Entered By: Alric Quan on 05/07/2015 10:44:02 [name] ([id number]) -------------------------------------------------------------------------------- Wound Assessment Details Patient Name: [name]. Date of Service: 05/07/2015 9:30 AM Medical Record Number: [id number] Patient  Account Number: [id number] Date of Birth/Sex: 1940/08/01 (75 y.o. Male) Treating RN: Ahmed Prima Primary Care Physician: Deland Pretty Other Clinician: Referring Physician: Mauricia Area Treating Physician/Extender: Frann Rider in Treatment: 0 Wound Status Wound Number: 14 Primary Arterial Insufficiency Ulcer Etiology: Wound Location: Left Calcaneus Wound Open Wounding Event: Gradually Appeared Status: Date Acquired: 04/23/2015 Comorbid Cataracts, Chronic Obstructive Weeks Of Treatment: 0 History: Pulmonary Disease (COPD), Clustered Wound: No Hypertension, Osteoarthritis, Received Chemotherapy, Received Radiation Photos Photo Uploaded By: Gretta Cool, RN, BSN, Kim on 05/07/2015 17:22:40 Wound Measurements Length: (cm) 4.3 Width: (cm) 3 Depth: (cm) 0.1 Area: (cm) 10.132 Volume: (cm) 1.013 % Reduction in Area: % Reduction in Volume: Epithelialization: None Tunneling: No Undermining: No Wound Description Classification: Partial Thickness Wound Margin: Flat and Intact Exudate Amount: None Present Foul Odor After Cleansing: No Wound Bed Granulation Amount: None Present (0%) Exposed Structure Necrotic Amount: Large (67-100%) Fascia Exposed: No Necrotic Quality: Eschar Fat Layer Exposed: No Tendon Exposed: No Muscle Exposed: No KAWIKA, ZAFAR ([id number]) Joint Exposed: No Bone Exposed: No Limited to Skin Breakdown Periwound Skin Texture Texture Color No Abnormalities Noted: No No Abnormalities Noted: No Localized Edema: Yes Temperature / Pain Moisture Temperature: No Abnormality No Abnormalities Noted: Yes Wound Preparation Ulcer Cleansing: Not Cleansed Topical Anesthetic Applied: None Treatment Notes Wound #14 (Left Calcaneus) 1. Cleansed with: Clean wound with Normal Saline 4. Dressing Applied: Other dressing (specify in notes) 5. Secondary Dressing Applied Kerlix/Conform 7. Secured with Paper tape Notes Painted wounds with  betadine Electronic Signature(s) Signed: 05/07/2015 5:37:28 PM By: Alric Quan Entered By: Alric Quan on 05/07/2015 10:45:15 [name] ([id number]) -------------------------------------------------------------------------------- Wound Assessment Details Patient Name: [name]. Date of Service: 05/07/2015 9:30 AM Medical Record Number: [id number] Patient Account Number: [id number] Date of Birth/Sex: 03/17/1941 (75 y.o. Male) Treating RN: Ahmed Prima Primary Care Physician: Deland Pretty Other Clinician: Referring Physician: Mauricia Area Treating Physician/Extender: Frann Rider in Treatment: 0 Wound Status Wound Number: 15 Primary Arterial Insufficiency Ulcer Etiology: Wound Location: Left Lower Leg - Posterior Wound Open Wounding Event: Gradually Appeared Status: Date Acquired: 04/23/2015 Comorbid Cataracts, Chronic Obstructive Weeks Of Treatment: 0 History: Pulmonary Disease (COPD), Clustered Wound: No Hypertension, Osteoarthritis, Received Chemotherapy, Received Radiation Photos Photo Uploaded By: Gretta Cool RN, BSN, Kim on 05/07/2015 17:22:40  Wound Measurements Length: (cm) 7 Width: (cm) 1 Depth: (cm) 0.1 Area: (cm) 5.498 Volume: (cm) 0.55 % Reduction in Area: % Reduction in Volume: Epithelialization: None Tunneling: No Undermining: No Wound Description Classification: Partial Thickness Wound Margin: Flat and Intact Exudate Amount: Medium Exudate Type: Serous Exudate Color: amber Foul Odor After Cleansing: No Wound Bed Granulation Amount: None Present (0%) Necrotic Amount: Large (67-100%) Necrotic Quality: Eschar, Adherent [address], [place]. ([id number]) Periwound Skin Texture Texture Color No Abnormalities Noted: No No Abnormalities Noted: No Localized Edema: Yes Temperature / Pain Moisture Temperature: No Abnormality No Abnormalities Noted: No Tenderness on Palpation: Yes Moist: Yes Wound Preparation Ulcer  Cleansing: Rinsed/Irrigated with Saline Treatment Notes Wound #15 (Left, Posterior Lower Leg) 1. Cleansed with: Clean wound with Normal Saline 4. Dressing Applied: Aquacel Ag 5. Secondary Dressing Applied Gauze and Kerlix/Conform 7. Secured with Recruitment consultant) Signed: 05/07/2015 5:37:28 PM By: Alric Quan Entered By: Alric Quan on 05/07/2015 10:47:30 [name] ([id number]) -------------------------------------------------------------------------------- Wound Assessment Details Patient Name: [name]. Date of Service: 05/07/2015 9:30 AM Medical Record Number: [id number] Patient Account Number: [id number] Date of Birth/Sex: [birth year] (75 y.o. 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 Wound Open Wounding Event: Gradually Appeared Status: Date Acquired: 04/23/2015 Comorbid Cataracts, Chronic Obstructive Weeks Of Treatment: 0 History: Pulmonary Disease (COPD), Clustered Wound: No Hypertension, Osteoarthritis, Received Chemotherapy, Received Radiation Photos Photo Uploaded By: Gretta Cool, RN, BSN, Kim on 05/07/2015 17:23:18 Wound Measurements Length: (cm) 3 Width: (cm) 1.4 Depth: (cm) 0.1 Area: (cm) 3.299 Volume: (cm) 0.33 % Reduction in Area: % Reduction in Volume: Epithelialization: None Tunneling: No Undermining: No Wound Description Classification: Partial Thickness Wound Margin: Flat and Intact Exudate Amount: None Present Foul Odor After Cleansing: No Wound Bed Granulation Amount: None Present (0%) Exposed Structure Necrotic Amount: Large (67-100%) Fascia Exposed: No Necrotic Quality: Eschar Fat Layer Exposed: No Tendon Exposed: No Muscle Exposed: No BERNICE, ZIMNY  ([id number]) Joint Exposed: No Bone Exposed: No Limited to Skin Breakdown Periwound Skin Texture Texture Color No Abnormalities Noted: Yes No Abnormalities Noted: No Moisture Temperature / Pain No Abnormalities Noted: No Temperature: No Abnormality Dry / Scaly: No Tenderness on Palpation: Yes Maceration: No Moist: No Wound Preparation Ulcer Cleansing: Not Cleansed Topical Anesthetic Applied: None Treatment Notes Wound #16 (Left Knee) 1. Cleansed with: Clean wound with Normal Saline 4. Dressing Applied: Other dressing (specify in notes) 5. Secondary Dressing Applied Kerlix/Conform 7. Secured with Paper tape Notes Painted wounds with betadine Electronic Signature(s) Signed: 05/07/2015 5:37:28 PM By: Alric Quan Entered By: Alric Quan on 05/07/2015 10:49:39 [name] ([id number]) -------------------------------------------------------------------------------- Wound Assessment Details Patient Name: [name]. Date of Service: 05/07/2015 9:30 AM Medical Record Number: [id number] Patient Account Number: [id number] Date of Birth/Sex: 1941-04-09 (75 y.o. 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 - 2nd Digit Wound Open Wounding Event: Gradually Appeared Status: Date Acquired: 03/02/2015 Comorbid Cataracts, Chronic Obstructive Weeks Of Treatment: 0 History: Pulmonary Disease (COPD), Clustered Wound: No Hypertension, Osteoarthritis, Received Chemotherapy, Received Radiation Photos Photo Uploaded By: Gretta Cool, RN, BSN, Kim on 05/07/2015 17:23:18 Wound Measurements Length: (cm) 5.8 % Reduction i Width: (cm) 2.5 % Reduction i  Depth: (cm) 0.1 Area: (cm) 11.388 Volume: (cm) 1.139 n Area: n  Volume: Wound Description Full Thickness Without Exposed Classification: Support Structures Wound Margin: Flat and Intact Exudate None Present Amount: Wound Bed Granulation Amount: None Present (0%) Exposed Structure Necrotic Amount: Large (67-100%) Fascia Exposed: No Necrotic Quality: Eschar Fat Layer Exposed: No CAROL, BELLUS ([id number]) Tendon Exposed: No Muscle Exposed: No Joint Exposed: No Bone Exposed: No Limited to Skin Breakdown Periwound Skin Texture Texture Color No Abnormalities Noted: No No Abnormalities Noted: No Callus: No Atrophie Blanche: No Crepitus: No Cyanosis: No Excoriation: No Ecchymosis: No Fluctuance: No Erythema: No Friable: No Hemosiderin Staining: No Induration: No Mottled: No Localized Edema: No Pallor: No Rash: No Rubor: No Scarring: No Moisture No Abnormalities Noted: No Dry / Scaly: No Maceration: No Moist: No Wound Preparation Ulcer Cleansing: Rinsed/Irrigated with Saline Topical Anesthetic Applied: None Treatment Notes Wound #17 (Right Hand - 2nd Digit) 1. Cleansed with: Clean wound with Normal Saline 4. Dressing Applied: Other dressing (specify in notes) 5. Secondary Dressing Applied Kerlix/Conform 7. Secured with Paper tape Notes Painted wounds with betadine Electronic Signature(s) Signed: 05/07/2015 5:51:05 PM By: Gretta Cool, RN, BSN, Kim RN, BSN Entered By: Gretta Cool, RN, BSN, Kim on 05/07/2015 10:50:53 [name] ([id number]) -------------------------------------------------------------------------------- Wound Assessment Details Patient Name: STORM, BRANDSMA. Date of Service: 05/07/2015 9:30 AM Medical Record Number: [id number] Patient Account Number: [id number] Date of Birth/Sex: 1940/11/10 (75 y.o. 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 - 3rd Digit Wound Open Wounding Event: Gradually Appeared Status: Date Acquired: 04/23/2015 Comorbid Cataracts, Chronic Obstructive Weeks Of Treatment: 0 History: Pulmonary Disease (COPD), Clustered Wound: No Hypertension, Osteoarthritis, Received Chemotherapy, Received Radiation Photos Photo Uploaded By: Gretta Cool, RN, BSN, Kim on 05/07/2015 17:23:19 Wound Measurements Length: (cm) 0.9 Width: (cm) 0.7 Depth: (cm) 0.1 Area: (cm) 0.495 Volume: (cm) 0.049 % Reduction in Area: % Reduction in Volume: Epithelialization: None Tunneling: No Undermining: No Wound Description Classification: Partial Thickness Wound Margin: Flat and Intact Exudate Amount: None Present Foul Odor After Cleansing: No Wound Bed Granulation Amount: None Present (0%) Necrotic Amount: Large (67-100%) Necrotic Quality: Eschar Periwound Skin Texture BARRINGTON, RIHN. ([id number]) Texture Color No Abnormalities Noted: No No Abnormalities Noted: No Localized Edema: Yes Temperature / Pain Moisture Temperature: No Abnormality No Abnormalities Noted: No Tenderness on Palpation: Yes Dry / Scaly: No Maceration: No Moist: No Treatment Notes Wound #18 (Right Hand - 3rd Digit) 1. Cleansed with: Clean wound with Normal Saline 4. Dressing Applied: Aquacel Ag 5. Secondary Dressing Applied Gauze and Kerlix/Conform 7. Secured with Recruitment consultant) Signed: 05/07/2015 5:37:28 PM By: Alric Quan Entered By: Alric Quan on 05/07/2015 10:51:24 [name] ([id number]) -------------------------------------------------------------------------------- Wound Assessment Details Patient Name: [name]. Date of Service: 05/07/2015 9:30 AM Medical Record Number: [id number] Patient Account Number: [id number] Date of Birth/Sex: April 25, 1941 (75 y.o. 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 - 5th Digit Wound Open Wounding Event: Gradually Appeared Status: Date Acquired: 04/23/2015 Comorbid Cataracts, Chronic Obstructive Weeks Of Treatment: 0 History: Pulmonary Disease (COPD), Clustered Wound: No Hypertension, Osteoarthritis, Received Chemotherapy, Received Radiation Photos Photo Uploaded By: Gretta Cool RN, BSN, Kim on 05/07/2015  17:24:09 Wound Measurements Length: (cm) 2 Width: (cm) 1.4 Depth: (cm) 0.1 Area: (cm) 2.199 Volume: (cm) 0.22 % Reduction in Area: % Reduction in Volume: Epithelialization: None Undermining: No Wound Description Classification: Partial Thickness Exudate Amount: None Present Foul Odor After Cleansing: No Wound Bed Granulation Amount: None Present (0%) Exposed Structure Necrotic Amount: Large (67-100%) Fascia Exposed: No Necrotic Quality: Eschar Fat Layer Exposed: No Tendon Exposed: No Muscle Exposed: No Joint Exposed: No LINDON, MAX ([id number]) Bone Exposed: No Limited to Skin Breakdown Periwound Skin Texture Texture Color No Abnormalities Noted: No No Abnormalities Noted: No Localized Edema: Yes Temperature / Pain Moisture Temperature: No Abnormality No Abnormalities Noted: No Tenderness on Palpation: Yes Dry / Scaly: No Maceration: No Moist: No Wound Preparation Ulcer Cleansing: Not Cleansed Topical Anesthetic Applied: None Treatment Notes Wound #19 (Right Hand - 5th Digit) 1. Cleansed with: Clean wound with Normal Saline 4. Dressing Applied: Other dressing (specify in notes) 5. Secondary Dressing Applied Kerlix/Conform 7. Secured with Paper tape Notes Painted wounds with betadine Electronic Signature(s) Signed: 05/07/2015 5:37:28 PM By: Alric Quan Entered By: Alric Quan on 05/07/2015 10:53:08 [name] ([id number]) -------------------------------------------------------------------------------- Wound Assessment Details Patient Name: [name]. Date of Service: 05/07/2015 9:30 AM Medical Record Number: [id number] Patient Account Number: [id number] Date of Birth/Sex: 1940/05/03 (75 y.o. 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 Second - Circumfernential Wound Open Status: Wounding Event: Gradually Appeared Comorbid Cataracts, Chronic Obstructive Date Acquired: 03/07/2015 History: Pulmonary Disease (COPD), Weeks Of Treatment: 0 Hypertension, Osteoarthritis, Received Clustered Wound: No Chemotherapy, Received Radiation Photos Photo Uploaded By: Gretta Cool, RN, BSN, Kim on 05/07/2015 17:24:10 Wound Measurements Length: (cm) 5.6 Width: (cm) 5 Depth: (cm) 0.1 Area: (cm) 21.991 Volume: (cm) 2.199 % Reduction in Area: % Reduction in Volume: Epithelialization: None Tunneling: No Undermining: No Wound Description Classification: Partial Thickness Wound Margin: Flat and Intact Exudate Amount: None Present Foul Odor After Cleansing: No Wound Bed Granulation Amount: None Present (0%) Exposed Structure Necrotic Amount: Large (67-100%) Fascia Exposed: No Necrotic Quality: Eschar Fat Layer Exposed: No Tendon Exposed: No Muscle Exposed: No BENITO, LEVITIN ([id number]) Joint Exposed: No Bone Exposed: No Limited to Skin Breakdown Periwound Skin Texture Texture Color No Abnormalities Noted: No No Abnormalities Noted: No Localized Edema: Yes Temperature / Pain Moisture Temperature: No Abnormality No Abnormalities Noted: No Wound Preparation Ulcer  Cleansing: Rinsed/Irrigated with Saline Treatment Notes Wound #2 (Right, Circumferential Toe Second) 1. Cleansed with: Clean wound with Normal Saline 4. Dressing Applied: Other dressing (specify in notes) 5. Secondary Dressing Applied Kerlix/Conform 7. Secured with Paper tape Notes Painted wounds with betadine Electronic Signature(s) Signed: 05/07/2015 5:37:28 PM By: Alric Quan Entered By: Alric Quan on 05/07/2015 10:26:40 [name] ([id number]) -------------------------------------------------------------------------------- Wound Assessment Details Patient Name: [name]. Date of Service: 05/07/2015 9:30 AM Medical Record Number: [id number] Patient Account Number: [id number] Date of Birth/Sex: Feb 04, 1941 (76 y.o. 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 - 1st Digit Wound Open Wounding Event: Gradually Appeared Status: Date Acquired: 04/23/2015 Comorbid Cataracts, Chronic Obstructive Weeks Of Treatment: 0 History: Pulmonary Disease (COPD), Clustered Wound: No Hypertension, Osteoarthritis, Received Chemotherapy, Received Radiation Photos Photo Uploaded  ByGretta Cool, RN, BSN, Kim on 05/07/2015 17:25:06 Wound Measurements Length: (cm) 0.6 % Reduction i Width: (cm) 0.5 % Reduction i Depth: (cm) 0.1 Area: (cm) 0.236 Volume: (cm) 0.024 n Area: n Volume: Wound Description Full Thickness Without Exposed Classification: Support Structures Wound Margin: Indistinct, nonvisible Exudate Small Amount: Exudate Type: Sanguinous Exudate Color: red Foul Odor After Cleansing: No Wound Bed Granulation Amount: Small (1-33%) Exposed Structure BRILYN, KISE ([id number]) Necrotic Amount: None Present  (0%) Fascia Exposed: No Fat Layer Exposed: No Tendon Exposed: No Muscle Exposed: No Joint Exposed: No Bone Exposed: No Limited to Skin Breakdown Periwound Skin Texture Texture Color No Abnormalities Noted: No No Abnormalities Noted: No Callus: No Atrophie Blanche: No Crepitus: No Cyanosis: No Excoriation: No Ecchymosis: No Fluctuance: No Erythema: No Friable: No Hemosiderin Staining: No Induration: No Mottled: No Localized Edema: No Pallor: No Rash: No Rubor: No Scarring: No Moisture No Abnormalities Noted: No Dry / Scaly: No Maceration: No Moist: Yes Wound Preparation Ulcer Cleansing: Rinsed/Irrigated with Saline Topical Anesthetic Applied: None Treatment Notes Wound #20 (Left Hand - 1st Digit) 1. Cleansed with: Clean wound with Normal Saline 4. Dressing Applied: Aquacel Ag 5. Secondary Dressing Applied Gauze and Kerlix/Conform 7. Secured with Recruitment consultant) Signed: 05/07/2015 5:51:05 PM By: Gretta Cool, RN, BSN, Kim RN, BSN Entered By: Gretta Cool, RN, BSN, Kim on 05/07/2015 10:56:28 [name] ([id number]) -------------------------------------------------------------------------------- Wound Assessment Details Patient Name: BAYNE, BUEHRER. Date of Service: 05/07/2015 9:30 AM Medical Record Number: [id number] Patient Account Number: [id number] Date of Birth/Sex: 1941-04-07 (75 y.o. Male) Treating RN: Ahmed Prima Primary Care Physician: Deland Pretty Other Clinician: Referring Physician: Mauricia Area Treating Physician/Extender: Frann Rider in Treatment: 0 Wound Status Wound Number: 20 Primary Etiology: Arterial Insufficiency Ulcer Wound Location: Left Hand - 1st Digit Wound Status: Healed - Epithelialized Wounding Event: Gradually Appeared Date Acquired: 04/23/2015 Weeks Of Treatment: 0 Clustered Wound: No Wound Measurements Length: (cm) Width: (cm) Depth: (cm) Area: (cm) Volume: (cm) 0 % Reduction in Area: 0 %  Reduction in Volume: 0 0 0 Periwound Skin Texture Texture Color No Abnormalities Noted: No No Abnormalities Noted: No Moisture No Abnormalities Noted: No Electronic Signature(s) Signed: 05/07/2015 5:37:28 PM By: Alric Quan Entered By: Alric Quan on 05/07/2015 10:57:15 [name] ([id number]) -------------------------------------------------------------------------------- Wound Assessment Details Patient Name: [name]. Date of Service: 05/07/2015 9:30 AM Medical Record Number: [id number] Patient Account Number: [id number] Date of Birth/Sex: 1940/09/05 (75 y.o. 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 Wound Open Wounding Event: Gradually Appeared Status: Date Acquired: 04/23/2015 Comorbid Cataracts, Chronic Obstructive Weeks Of Treatment: 0 History: Pulmonary Disease (COPD), Clustered Wound: No Hypertension, Osteoarthritis, Received Chemotherapy, Received Radiation Photos Photo Uploaded By: Gretta Cool, RN, BSN, Kim on 05/07/2015 17:25:06 Wound Measurements Length: (cm) 5 Width: (cm) 2 Depth: (cm) 0.1 Area: (cm) 7.854 Volume: (cm) 0.785 % Reduction in Area: % Reduction in Volume: Epithelialization: None Tunneling: No Undermining: No Wound Description Classification: Partial Thickness Wound Margin: Flat and Intact Exudate Amount: None Present Foul Odor After Cleansing: No Wound Bed Granulation Amount: None Present (0%) Exposed Structure Necrotic Amount: Large (67-100%) Fascia Exposed: No Necrotic Quality: Eschar Fat Layer Exposed: No Tendon Exposed: No Muscle Exposed: No ASAAD, WOHLER ([id number]) Joint Exposed: No Bone Exposed: No Limited to Skin Breakdown Periwound  Skin Texture Texture Color  No Abnormalities Noted: No No Abnormalities Noted: No Moisture Temperature / Pain No Abnormalities Noted: No Temperature: No Abnormality Dry / Scaly: No Tenderness on Palpation: Yes Maceration: No Moist: No Wound Preparation Ulcer Cleansing: Not Cleansed Topical Anesthetic Applied: None Treatment Notes Wound #21 (Left Hand - 3rd Digit) 1. Cleansed with: Clean wound with Normal Saline 4. Dressing Applied: Aquacel Ag 5. Secondary Dressing Applied Gauze and Kerlix/Conform 7. Secured with Recruitment consultant) Signed: 05/07/2015 5:37:28 PM By: Alric Quan Entered By: Alric Quan on 05/07/2015 10:56:39 [name] ([id number]) -------------------------------------------------------------------------------- Wound Assessment Details Patient Name: [name]. Date of Service: 05/07/2015 9:30 AM Medical Record Number: [id number] Patient Account Number: [id number] Date of Birth/Sex: [birth year] (75 y.o. 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 - 4th Digit Wound Open Wounding Event: Gradually Appeared Status: Date Acquired: 03/02/2015 Comorbid Cataracts, Chronic Obstructive Weeks Of Treatment: 0 History: Pulmonary Disease (COPD), Clustered Wound: No Hypertension, Osteoarthritis, Received Chemotherapy, Received Radiation Photos Photo Uploaded By: Gretta Cool, RN, BSN, Kim on 05/07/2015 17:25:07 Wound Measurements Length: (cm) 0.8 Width: (cm) 1.3 Depth: (cm) 0.1 Area: (cm) 0.817 Volume: (cm) 0.082 % Reduction in Area: 0% % Reduction in Volume: 0% Wound Description Full Thickness Without Exposed Classification: Support Structures Wound Margin: Flat and Intact Exudate None  Present Amount: Wound Bed Granulation Amount: None Present (0%) Exposed Structure Necrotic Amount: Large (67-100%) Fascia Exposed: No Necrotic Quality: Eschar Fat Layer Exposed: No JOEVON, CATALA ([id number]) Tendon Exposed: No Muscle Exposed: No Joint Exposed: No Bone Exposed: No Limited to Skin Breakdown Periwound Skin Texture Texture Color No Abnormalities Noted: No No Abnormalities Noted: No Callus: No Atrophie Blanche: No Crepitus: No Cyanosis: No Excoriation: No Ecchymosis: No Fluctuance: No Erythema: No Friable: No Hemosiderin Staining: No Induration: No Mottled: No Localized Edema: No Pallor: No Rash: No Rubor: No Scarring: No Moisture No Abnormalities Noted: No Dry / Scaly: No Maceration: No Moist: No Treatment Notes Wound #22 (Left Hand - 4th Digit) 1. Cleansed with: Clean wound with Normal Saline 4. Dressing Applied: Other dressing (specify in notes) 5. Secondary Dressing Applied Kerlix/Conform 7. Secured with Paper tape Notes Painted wounds with betadine Electronic Signature(s) Signed: 05/07/2015 5:51:05 PM By: Gretta Cool, RN, BSN, Kim RN, BSN Entered By: Gretta Cool, RN, BSN, Kim on 05/07/2015 10:54:28 [name] ([id number]) -------------------------------------------------------------------------------- Wound Assessment Details Patient Name: MARLEN, STEWARD. Date of Service: 05/07/2015 9:30 AM Medical Record Number: [id number] Patient Account Number: [id number] Date of Birth/Sex: [birth year] (75 y.o. 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 Third Wound Open Wounding Event: Gradually Appeared Status: Date Acquired: 04/23/2015 Comorbid Cataracts, Chronic Obstructive Weeks Of Treatment:  0 History: Pulmonary Disease (COPD), Clustered Wound: No Hypertension, Osteoarthritis, Received Chemotherapy, Received Radiation Photos Photo Uploaded By: Gretta Cool, RN, BSN, Kim on 05/07/2015 17:25:41 Wound Measurements Length: (cm) 1.5 Width: (cm) 1.7 Depth: (cm) 0.1 Area: (cm) 2.003 Volume: (cm) 0.2 % Reduction in Area: % Reduction in Volume: Epithelialization: None Tunneling: No Undermining: No Wound Description Full Thickness Without Exposed Classification: Support Structures Wound Margin: Flat and Intact Exudate None Present Amount: Foul Odor After Cleansing: No Wound Bed Granulation Amount: None Present (0%) Exposed Structure Necrotic Amount:  Large (67-100%) Fascia Exposed: No Necrotic Quality: Eschar Fat Layer Exposed: No CARDERO, QUIZHPI ([id number]) Tendon Exposed: No Muscle Exposed: No Joint Exposed: No Bone Exposed: No Limited to Skin Breakdown Periwound Skin Texture Texture Color No Abnormalities Noted: No No Abnormalities Noted: No Localized Edema: Yes Temperature / Pain Moisture Temperature: No Abnormality No Abnormalities Noted: No Wound Preparation Ulcer Cleansing: Rinsed/Irrigated with Saline Treatment Notes Wound #3 (Right Toe Third) 1. Cleansed with: Clean wound with Normal Saline 4. Dressing Applied: Other dressing (specify in notes) 5. Secondary Dressing Applied Kerlix/Conform 7. Secured with Paper tape Notes Painted wounds with betadine Electronic Signature(s) Signed: 05/07/2015 5:37:28 PM By: Alric Quan Entered By: Alric Quan on 05/07/2015 10:28:27 [name] ([id number]) -------------------------------------------------------------------------------- Wound Assessment Details Patient Name: [name]. Date of Service: 05/07/2015 9:30 AM Medical Record Number: [id number] Patient Account Number: [id number] Date of Birth/Sex: 14-Nov-1940 (75 y.o. 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 Fifth - Medial Wound Open Wounding Event: Gradually Appeared Status: Date Acquired: 04/23/2015 Comorbid Cataracts, Chronic Obstructive Weeks Of Treatment: 0 History: Pulmonary Disease (COPD), Clustered Wound: No Hypertension, Osteoarthritis, Received Chemotherapy, Received Radiation Photos Photo Uploaded By: Gretta Cool, RN, BSN, Kim on 05/07/2015 17:25:41 Wound Measurements Length: (cm) 0.9 Width: (cm) 0.5 Depth: (cm) 0.1 Area: (cm) 0.353 Volume: (cm) 0.035 % Reduction in Area: % Reduction in Volume: Epithelialization: None Tunneling: No Undermining: No Wound Description Classification: Partial Thickness Wound Margin: Flat and Intact Exudate Amount: None Present Foul Odor After Cleansing: No Wound Bed Granulation Amount: None Present (0%) Exposed Structure Necrotic Amount: Large (67-100%) Fascia Exposed: No Necrotic Quality: Eschar Fat Layer Exposed: No Tendon Exposed: No Muscle Exposed: No HAYZE, HASS ([id number]) Joint Exposed: No Bone Exposed: No Limited to Skin Breakdown Periwound Skin Texture Texture Color No Abnormalities Noted: No No Abnormalities Noted: No Localized Edema: Yes Temperature / Pain Moisture Temperature: No Abnormality No Abnormalities Noted: No Wound Preparation Ulcer Cleansing: Rinsed/Irrigated with Saline Treatment Notes Wound #4 (Right, Medial Toe Fifth) 1. Cleansed with: Clean wound with Normal Saline 4. Dressing Applied: Other dressing (specify in notes) 5. Secondary Dressing Applied Kerlix/Conform 7. Secured with Paper tape Notes Painted wounds with betadine Electronic Signature(s) Signed: 05/07/2015 5:37:28 PM By: Alric Quan Entered By: Alric Quan on 05/07/2015 10:30:21 [name] ([id number]) -------------------------------------------------------------------------------- Wound Assessment Details Patient Name: [name]. Date of Service: 05/07/2015 9:30 AM Medical Record Number: [id number] Patient Account Number: [id number] Date of Birth/Sex: 1940-09-05 (75 y.o. 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 Wound Open Wounding Event: Gradually Appeared Status: Date Acquired: 04/23/2015 Comorbid Cataracts, Chronic Obstructive Weeks Of Treatment: 0 History: Pulmonary Disease (COPD), Clustered Wound: No Hypertension, Osteoarthritis, Received Chemotherapy, Received Radiation Photos Photo Uploaded By: Gretta Cool, RN, BSN, Kim on 05/07/2015 17:25:42 Wound Measurements Length: (cm) 1.5 Width: (cm) 2.2 Depth: (cm) 0.1 Area: (cm) 2.592 Volume: (cm) 0.259 % Reduction in Area: % Reduction in Volume: Epithelialization: None Tunneling: No Undermining: No Wound Description Classification: Partial Thickness Wound Margin: Flat and Intact Exudate Amount: None Present Foul Odor After Cleansing: No Wound Bed Granulation Amount: None Present (0%) Exposed Structure Necrotic Amount: Large (67-100%) Fascia Exposed:  No Necrotic Quality: Eschar Fat Layer Exposed: No Tendon Exposed: No Muscle Exposed: No LESLEE, LEHR ([id number]) Joint Exposed: No Bone Exposed: No Limited to Skin Breakdown Periwound Skin Texture Texture Color No Abnormalities Noted: No No Abnormalities Noted: No Localized Edema: Yes Temperature / Pain Moisture Temperature: No Abnormality No Abnormalities Noted: No Wound Preparation Ulcer Cleansing: Rinsed/Irrigated with Saline Treatment Notes Wound #5 (Right  Metatarsal head first) 1. Cleansed with: Clean wound with Normal Saline 4. Dressing Applied: Other dressing (specify in notes) 5. Secondary Dressing Applied Kerlix/Conform 7. Secured with Paper tape Notes Painted wounds with betadine Electronic Signature(s) Signed: 05/07/2015 5:37:28 PM By: Alric Quan Entered By: Alric Quan on 05/07/2015 10:32:12 [name] ([id number]) -------------------------------------------------------------------------------- Wound Assessment Details Patient Name: [name]. Date of Service: 05/07/2015 9:30 AM Medical Record Number: [id number] Patient Account Number: [id number] Date of Birth/Sex: 08-10-1940 (75 y.o. Male) Treating RN: Ahmed Prima Primary Care Physician: Deland Pretty Other Clinician: Referring Physician: Mauricia Area Treating Physician/Extender: Frann Rider in Treatment: 0 Wound Status Wound Number: 6 Primary Atypical Etiology: Wound Location: Right Calcaneus Secondary Arterial Insufficiency Ulcer Wounding Event: Gradually Appeared Etiology: Date Acquired: 04/23/2015 Wound Open Weeks Of Treatment: 0 Status: Clustered Wound: No Comorbid Cataracts, Chronic Obstructive History: Pulmonary Disease (COPD), Hypertension, Osteoarthritis, Received Chemotherapy, Received Radiation Photos Photo Uploaded By: Gretta Cool, RN, BSN, Kim on 05/07/2015 17:26:27 Wound Measurements Length: (cm) 3 Width: (cm) 5.5 Depth: (cm) 0.1 Area: (cm) 12.959 Volume: (cm) 1.296 % Reduction in Area: % Reduction in Volume: Epithelialization: None Tunneling: No Undermining: No Wound Description Classification: Partial Thickness Exudate Amount: None Present Foul Odor After Cleansing: No Wound Bed Granulation Amount: None Present (0%) Exposed Structure Necrotic Amount: Large (67-100%) Fascia Exposed: No Necrotic Quality: Eschar Fat Layer Exposed: No Tendon Exposed: No SHERRICK, SCHAPER ([id number]) Muscle Exposed:  No Joint Exposed: No Bone Exposed: No Limited to Skin Breakdown Periwound Skin Texture Texture Color No Abnormalities Noted: No No Abnormalities Noted: No Localized Edema: Yes Temperature / Pain Rash: No Temperature: No Abnormality Moisture No Abnormalities Noted: No Dry / Scaly: No Maceration: No Moist: No Wound Preparation Ulcer Cleansing: Rinsed/Irrigated with Saline Treatment Notes Wound #6 (Right Calcaneus) 1. Cleansed with: Clean wound with Normal Saline 4. Dressing Applied: Other dressing (specify in notes) 5. Secondary Dressing Applied Kerlix/Conform 7. Secured with Paper tape Notes Painted wounds with betadine Electronic Signature(s) Signed: 05/07/2015 5:37:28 PM By: Alric Quan Entered By: Alric Quan on 05/07/2015 10:33:50 [name] ([id number]) -------------------------------------------------------------------------------- Wound Assessment Details Patient Name: [name]. Date of Service: 05/07/2015 9:30 AM Medical Record Number: [id number] Patient Account Number: [id number] Date of Birth/Sex: 26-Apr-1941 (75 y.o. 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 Wound Open Wounding Event: Gradually Appeared Status: Date Acquired: 04/23/2015 Comorbid Cataracts, Chronic Obstructive Weeks Of Treatment: 0 History: Pulmonary Disease (COPD), Clustered Wound: No Hypertension, Osteoarthritis, Received Chemotherapy, Received Radiation Photos Photo Uploaded By: Gretta Cool, RN, BSN, Kim on 05/07/2015 17:26:27 Wound Measurements Length: (cm) 3.2 Width: (cm) 4 Depth: (cm) 0.1 Area: (cm) 10.053 Volume: (cm) 1.005 % Reduction in Area: % Reduction in Volume: Epithelialization:  None Tunneling: No Undermining: No Wound Description Classification: Partial Thickness Wound Margin: Flat and Intact Exudate Amount: None Present Foul Odor After Cleansing: No Wound Bed Granulation Amount: None Present (0%) Exposed Structure Necrotic Amount: Large (67-100%) Fascia Exposed: No  Necrotic Quality: Eschar Fat Layer Exposed: No Tendon Exposed: No Muscle Exposed: No LEMARION, WALSINGHAM ([id number]) Joint Exposed: No Bone Exposed: No Limited to Skin Breakdown Periwound Skin Texture Texture Color No Abnormalities Noted: No No Abnormalities Noted: No Moisture Temperature / Pain No Abnormalities Noted: Yes Tenderness on Palpation: Yes Treatment Notes Wound #7 (Right Malleolus) 1. Cleansed with: Clean wound with Normal Saline 4. Dressing Applied: Other dressing (specify in notes) 5. Secondary Dressing Applied Kerlix/Conform 7. Secured with Paper tape Notes Painted wounds with betadine Electronic Signature(s) Signed: 05/07/2015 5:37:28 PM By: Alric Quan Entered By: Alric Quan on 05/07/2015 10:35:09 [name] ([id number]) -------------------------------------------------------------------------------- Wound Assessment Details Patient Name: [name]. Date of Service: 05/07/2015 9:30 AM Medical Record Number: [id number] Patient Account Number: [id number] Date of Birth/Sex: 1941-03-09 (75 y.o. 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 - Posterior Wound Open Wounding Event: Gradually Appeared Status: Date Acquired: 04/23/2015 Comorbid Cataracts, Chronic Obstructive Weeks Of Treatment: 0 History: Pulmonary Disease (COPD), Clustered Wound: No Hypertension, Osteoarthritis,  Received Chemotherapy, Received Radiation Photos Photo Uploaded By: Gretta Cool, RN, BSN, Kim on 05/07/2015 17:26:28 Wound Measurements Length: (cm) 9 Width: (cm) 3 Depth: (cm) 0.1 Area: (cm) 21.206 Volume: (cm) 2.121 % Reduction in Area: 0% % Reduction in Volume: 0% Epithelialization: None Tunneling: No Undermining: No Wound Description Classification: Partial Thickness Wound Margin: Flat and Intact Exudate Amount: None Present Foul Odor After Cleansing: No Wound Bed Granulation Amount: None Present (0%) Exposed Structure Necrotic Amount: Large (67-100%) Fascia Exposed: No Necrotic Quality: Eschar, Adherent Slough Fat Layer Exposed: No Tendon Exposed: No Muscle Exposed: No DENZEL, HAWES ([id number]) Joint Exposed: No Bone Exposed: No Limited to Skin Breakdown Periwound Skin Texture Texture Color No Abnormalities Noted: No No Abnormalities Noted: No Localized Edema: Yes Temperature / Pain Moisture Temperature: No Abnormality No Abnormalities Noted: Yes Tenderness on Palpation: Yes Wound Preparation Ulcer Cleansing: Rinsed/Irrigated with Saline Treatment Notes Wound #8 (Right, Posterior Lower Leg) 1. Cleansed with: Clean wound with Normal Saline 4. Dressing Applied: Aquacel Ag 5. Secondary Dressing Applied Gauze and Kerlix/Conform 7. Secured with Recruitment consultant) Signed: 05/07/2015 5:37:28 PM By: Alric Quan Entered By: Alric Quan on 05/07/2015 10:48:27 [name] ([id number]) -------------------------------------------------------------------------------- Wound Assessment Details Patient Name: [name]. Date of Service: 05/07/2015 9:30 AM Medical Record Number: [id number] Patient Account Number: [id number] Date of Birth/Sex: 1940-09-17 (75 y.o. Male) Treating RN: Ahmed Prima Primary Care Physician: Deland Pretty Other Clinician: Referring Physician: Mauricia Area Treating Physician/Extender: Frann Rider in  Treatment: 0 Wound Status Wound Number: 9 Primary Arterial Insufficiency Ulcer Etiology: Wound Location: Left Toe Great Wound Open Wounding Event: Gradually Appeared Status: Date Acquired: 04/23/2015 Comorbid Cataracts, Chronic Obstructive Weeks Of Treatment: 0 History: Pulmonary Disease (COPD), Clustered Wound: No Hypertension, Osteoarthritis, Received Chemotherapy, Received Radiation Photos Photo Uploaded By: Gretta Cool, RN, BSN, Kim on 05/07/2015 17:26:42 Wound Measurements Length: (cm) 3.5 Width: (cm) 4.5 Depth: (cm) 0.1 Area: (cm) 12.37 Volume: (cm) 1.237 % Reduction in Area: % Reduction in Volume: Epithelialization: None Tunneling: No Undermining: No Wound Description Classification: Partial Thickness Exudate Amount: None Present Foul Odor After Cleansing: No Wound Bed Granulation Amount: None Present (0%) Exposed Structure Necrotic Amount: Large (67-100%) Fascia Exposed: No Necrotic Quality: Eschar Fat Layer Exposed: No Tendon Exposed: No Muscle Exposed: No Joint Exposed: No Mctigue, Edy M. (  [id number]) Bone Exposed: No Limited to Skin Breakdown Periwound Skin Texture Texture Color No Abnormalities Noted: No No Abnormalities Noted: No Localized Edema: Yes Temperature / Pain Moisture Temperature: No Abnormality No Abnormalities Noted: Yes Treatment Notes Wound #9 (Left Toe Great) 1. Cleansed with: Clean wound with Normal Saline 4. Dressing Applied: Other dressing (specify in notes) 5. Secondary Dressing Applied Kerlix/Conform 7. Secured with Paper tape Notes Painted wounds with betadine Electronic Signature(s) Signed: 05/07/2015 5:37:28 PM By: Alric Quan Entered By: Alric Quan on 05/07/2015 10:38:03 [name] ([id number]) -------------------------------------------------------------------------------- Vitals Details Patient Name: [name]. Date of Service: 05/07/2015 9:30 AM Medical Record Number: [id number] Patient  Account Number: [id number] Date of Birth/Sex: 1940/11/15 (75 y.o. Male) Treating RN: Ahmed Prima Primary Care Physician: Deland Pretty Other Clinician: Referring Physician: Mauricia Area Treating Physician/Extender: Frann Rider in Treatment: 0 Vital Signs Time Taken: 09:50 Pulse (bpm): 72 Height (in): 70 Respiratory Rate (breaths/min): 18 Source: Stated Blood Pressure (mmHg): 99/40 Weight (lbs): 145 Reference Range: 80 - 120 mg / dl Source: Stated Body Mass Index (BMI): 20.8 Electronic Signature(s) Signed: 05/07/2015 5:37:28 PM By: Alric Quan Entered By: Alric Quan on 05/07/2015 09:50:51

## 2015-05-11 ENCOUNTER — Ambulatory Visit (INDEPENDENT_AMBULATORY_CARE_PROVIDER_SITE_OTHER): Payer: MEDICARE

## 2015-05-11 ENCOUNTER — Ambulatory Visit: Payer: MEDICARE | Admitting: Podiatry

## 2015-05-11 DIAGNOSIS — I4891 Unspecified atrial fibrillation: Secondary | ICD-10-CM

## 2015-05-11 LAB — POCT INR: INR: 3.2

## 2015-05-18 ENCOUNTER — Ambulatory Visit (INDEPENDENT_AMBULATORY_CARE_PROVIDER_SITE_OTHER): Payer: MEDICARE | Admitting: *Deleted

## 2015-05-18 DIAGNOSIS — I4891 Unspecified atrial fibrillation: Secondary | ICD-10-CM

## 2015-05-18 LAB — POCT INR: INR: 4.9

## 2015-05-20 ENCOUNTER — Encounter (HOSPITAL_BASED_OUTPATIENT_CLINIC_OR_DEPARTMENT_OTHER): Payer: MEDICARE | Attending: Internal Medicine

## 2015-05-20 DIAGNOSIS — Z992 Dependence on renal dialysis: Secondary | ICD-10-CM | POA: Diagnosis not present

## 2015-05-20 DIAGNOSIS — I130 Hypertensive heart and chronic kidney disease with heart failure and stage 1 through stage 4 chronic kidney disease, or unspecified chronic kidney disease: Secondary | ICD-10-CM | POA: Diagnosis not present

## 2015-05-20 DIAGNOSIS — Z9221 Personal history of antineoplastic chemotherapy: Secondary | ICD-10-CM | POA: Diagnosis not present

## 2015-05-20 DIAGNOSIS — Z923 Personal history of irradiation: Secondary | ICD-10-CM | POA: Insufficient documentation

## 2015-05-20 DIAGNOSIS — Z87891 Personal history of nicotine dependence: Secondary | ICD-10-CM | POA: Diagnosis not present

## 2015-05-20 DIAGNOSIS — L98491 Non-pressure chronic ulcer of skin of other sites limited to breakdown of skin: Secondary | ICD-10-CM | POA: Insufficient documentation

## 2015-05-20 DIAGNOSIS — I2510 Atherosclerotic heart disease of native coronary artery without angina pectoris: Secondary | ICD-10-CM | POA: Diagnosis not present

## 2015-05-20 DIAGNOSIS — I509 Heart failure, unspecified: Secondary | ICD-10-CM | POA: Insufficient documentation

## 2015-05-20 DIAGNOSIS — N186 End stage renal disease: Secondary | ICD-10-CM | POA: Insufficient documentation

## 2015-05-20 DIAGNOSIS — I739 Peripheral vascular disease, unspecified: Secondary | ICD-10-CM | POA: Diagnosis not present

## 2015-05-20 DIAGNOSIS — L97221 Non-pressure chronic ulcer of left calf limited to breakdown of skin: Secondary | ICD-10-CM | POA: Insufficient documentation

## 2015-05-20 DIAGNOSIS — Z906 Acquired absence of other parts of urinary tract: Secondary | ICD-10-CM | POA: Insufficient documentation

## 2015-05-20 DIAGNOSIS — Z8551 Personal history of malignant neoplasm of bladder: Secondary | ICD-10-CM | POA: Insufficient documentation

## 2015-05-20 DIAGNOSIS — L97521 Non-pressure chronic ulcer of other part of left foot limited to breakdown of skin: Secondary | ICD-10-CM | POA: Diagnosis not present

## 2015-05-20 DIAGNOSIS — L97211 Non-pressure chronic ulcer of right calf limited to breakdown of skin: Secondary | ICD-10-CM | POA: Diagnosis not present

## 2015-05-20 DIAGNOSIS — Z7901 Long term (current) use of anticoagulants: Secondary | ICD-10-CM | POA: Diagnosis not present

## 2015-05-20 DIAGNOSIS — I4891 Unspecified atrial fibrillation: Secondary | ICD-10-CM | POA: Insufficient documentation

## 2015-05-20 DIAGNOSIS — Z936 Other artificial openings of urinary tract status: Secondary | ICD-10-CM | POA: Insufficient documentation

## 2015-05-20 DIAGNOSIS — L97411 Non-pressure chronic ulcer of right heel and midfoot limited to breakdown of skin: Secondary | ICD-10-CM | POA: Insufficient documentation

## 2015-05-20 DIAGNOSIS — L97421 Non-pressure chronic ulcer of left heel and midfoot limited to breakdown of skin: Secondary | ICD-10-CM | POA: Diagnosis not present

## 2015-05-29 ENCOUNTER — Inpatient Hospital Stay (HOSPITAL_COMMUNITY): Payer: MEDICARE

## 2015-05-29 ENCOUNTER — Emergency Department (HOSPITAL_COMMUNITY): Payer: MEDICARE

## 2015-05-29 ENCOUNTER — Encounter (HOSPITAL_COMMUNITY): Payer: Self-pay | Admitting: *Deleted

## 2015-05-29 ENCOUNTER — Inpatient Hospital Stay (HOSPITAL_COMMUNITY)
Admission: EM | Admit: 2015-05-29 | Discharge: 2015-06-02 | DRG: 871 | Disposition: E | Payer: MEDICARE | Attending: Internal Medicine | Admitting: Internal Medicine

## 2015-05-29 ENCOUNTER — Encounter: Payer: Self-pay | Admitting: Cardiology

## 2015-05-29 DIAGNOSIS — Z8249 Family history of ischemic heart disease and other diseases of the circulatory system: Secondary | ICD-10-CM | POA: Diagnosis not present

## 2015-05-29 DIAGNOSIS — I96 Gangrene, not elsewhere classified: Secondary | ICD-10-CM | POA: Diagnosis present

## 2015-05-29 DIAGNOSIS — E872 Acidosis, unspecified: Secondary | ICD-10-CM | POA: Diagnosis present

## 2015-05-29 DIAGNOSIS — Z992 Dependence on renal dialysis: Secondary | ICD-10-CM

## 2015-05-29 DIAGNOSIS — Z79899 Other long term (current) drug therapy: Secondary | ICD-10-CM | POA: Diagnosis not present

## 2015-05-29 DIAGNOSIS — I2510 Atherosclerotic heart disease of native coronary artery without angina pectoris: Secondary | ICD-10-CM | POA: Diagnosis present

## 2015-05-29 DIAGNOSIS — E871 Hypo-osmolality and hyponatremia: Secondary | ICD-10-CM | POA: Diagnosis present

## 2015-05-29 DIAGNOSIS — A419 Sepsis, unspecified organism: Secondary | ICD-10-CM | POA: Diagnosis present

## 2015-05-29 DIAGNOSIS — Z7901 Long term (current) use of anticoagulants: Secondary | ICD-10-CM | POA: Diagnosis not present

## 2015-05-29 DIAGNOSIS — M869 Osteomyelitis, unspecified: Secondary | ICD-10-CM | POA: Diagnosis present

## 2015-05-29 DIAGNOSIS — L8991 Pressure ulcer of unspecified site, stage 1: Secondary | ICD-10-CM | POA: Diagnosis present

## 2015-05-29 DIAGNOSIS — Z952 Presence of prosthetic heart valve: Secondary | ICD-10-CM | POA: Diagnosis not present

## 2015-05-29 DIAGNOSIS — D631 Anemia in chronic kidney disease: Secondary | ICD-10-CM | POA: Diagnosis present

## 2015-05-29 DIAGNOSIS — Z66 Do not resuscitate: Secondary | ICD-10-CM | POA: Diagnosis present

## 2015-05-29 DIAGNOSIS — A480 Gas gangrene: Secondary | ICD-10-CM | POA: Diagnosis present

## 2015-05-29 DIAGNOSIS — W19XXXA Unspecified fall, initial encounter: Secondary | ICD-10-CM | POA: Diagnosis present

## 2015-05-29 DIAGNOSIS — M86272 Subacute osteomyelitis, left ankle and foot: Secondary | ICD-10-CM | POA: Diagnosis present

## 2015-05-29 DIAGNOSIS — R6520 Severe sepsis without septic shock: Secondary | ICD-10-CM | POA: Diagnosis present

## 2015-05-29 DIAGNOSIS — L8993 Pressure ulcer of unspecified site, stage 3: Secondary | ICD-10-CM | POA: Diagnosis present

## 2015-05-29 DIAGNOSIS — Z87891 Personal history of nicotine dependence: Secondary | ICD-10-CM | POA: Diagnosis not present

## 2015-05-29 DIAGNOSIS — Z8551 Personal history of malignant neoplasm of bladder: Secondary | ICD-10-CM

## 2015-05-29 DIAGNOSIS — L8990 Pressure ulcer of unspecified site, unspecified stage: Secondary | ICD-10-CM | POA: Diagnosis present

## 2015-05-29 DIAGNOSIS — Z7982 Long term (current) use of aspirin: Secondary | ICD-10-CM

## 2015-05-29 DIAGNOSIS — Z888 Allergy status to other drugs, medicaments and biological substances status: Secondary | ICD-10-CM

## 2015-05-29 DIAGNOSIS — E43 Unspecified severe protein-calorie malnutrition: Secondary | ICD-10-CM | POA: Diagnosis present

## 2015-05-29 DIAGNOSIS — I5032 Chronic diastolic (congestive) heart failure: Secondary | ICD-10-CM | POA: Diagnosis present

## 2015-05-29 DIAGNOSIS — I428 Other cardiomyopathies: Secondary | ICD-10-CM | POA: Diagnosis present

## 2015-05-29 DIAGNOSIS — I129 Hypertensive chronic kidney disease with stage 1 through stage 4 chronic kidney disease, or unspecified chronic kidney disease: Secondary | ICD-10-CM | POA: Diagnosis present

## 2015-05-29 DIAGNOSIS — Z954 Presence of other heart-valve replacement: Secondary | ICD-10-CM

## 2015-05-29 DIAGNOSIS — I959 Hypotension, unspecified: Secondary | ICD-10-CM | POA: Diagnosis present

## 2015-05-29 DIAGNOSIS — I481 Persistent atrial fibrillation: Secondary | ICD-10-CM | POA: Diagnosis present

## 2015-05-29 DIAGNOSIS — I739 Peripheral vascular disease, unspecified: Secondary | ICD-10-CM | POA: Diagnosis present

## 2015-05-29 DIAGNOSIS — R627 Adult failure to thrive: Secondary | ICD-10-CM | POA: Diagnosis present

## 2015-05-29 DIAGNOSIS — Z885 Allergy status to narcotic agent status: Secondary | ICD-10-CM

## 2015-05-29 DIAGNOSIS — N186 End stage renal disease: Secondary | ICD-10-CM | POA: Diagnosis present

## 2015-05-29 DIAGNOSIS — I350 Nonrheumatic aortic (valve) stenosis: Secondary | ICD-10-CM

## 2015-05-29 DIAGNOSIS — L89890 Pressure ulcer of other site, unstageable: Secondary | ICD-10-CM | POA: Diagnosis present

## 2015-05-29 DIAGNOSIS — E039 Hypothyroidism, unspecified: Secondary | ICD-10-CM | POA: Diagnosis present

## 2015-05-29 DIAGNOSIS — R34 Anuria and oliguria: Secondary | ICD-10-CM | POA: Diagnosis present

## 2015-05-29 DIAGNOSIS — Z515 Encounter for palliative care: Secondary | ICD-10-CM | POA: Diagnosis present

## 2015-05-29 DIAGNOSIS — I4891 Unspecified atrial fibrillation: Secondary | ICD-10-CM | POA: Diagnosis not present

## 2015-05-29 DIAGNOSIS — M86271 Subacute osteomyelitis, right ankle and foot: Secondary | ICD-10-CM | POA: Diagnosis present

## 2015-05-29 DIAGNOSIS — Z01810 Encounter for preprocedural cardiovascular examination: Secondary | ICD-10-CM | POA: Diagnosis not present

## 2015-05-29 LAB — I-STAT VENOUS BLOOD GAS, ED
Acid-base deficit: 2 mmol/L (ref 0.0–2.0)
BICARBONATE: 23.5 meq/L (ref 20.0–24.0)
O2 Saturation: 64 %
PH VEN: 7.335 — AB (ref 7.250–7.300)
PO2 VEN: 36 mmHg (ref 30.0–45.0)
TCO2: 25 mmol/L (ref 0–100)
pCO2, Ven: 44.1 mmHg — ABNORMAL LOW (ref 45.0–50.0)

## 2015-05-29 LAB — CBC WITH DIFFERENTIAL/PLATELET
BASOS ABS: 0 10*3/uL (ref 0.0–0.1)
BASOS PCT: 0 %
EOS PCT: 0 %
Eosinophils Absolute: 0 10*3/uL (ref 0.0–0.7)
HEMATOCRIT: 29.6 % — AB (ref 39.0–52.0)
HEMOGLOBIN: 9.2 g/dL — AB (ref 13.0–17.0)
LYMPHS ABS: 1.8 10*3/uL (ref 0.7–4.0)
Lymphocytes Relative: 7 %
MCH: 34.8 pg — ABNORMAL HIGH (ref 26.0–34.0)
MCHC: 31.1 g/dL (ref 30.0–36.0)
MCV: 112.1 fL — ABNORMAL HIGH (ref 78.0–100.0)
MONOS PCT: 4 %
Monocytes Absolute: 1.1 10*3/uL — ABNORMAL HIGH (ref 0.1–1.0)
Neutro Abs: 23.5 10*3/uL — ABNORMAL HIGH (ref 1.7–7.7)
Neutrophils Relative %: 89 %
Platelets: 248 10*3/uL (ref 150–400)
RBC: 2.64 MIL/uL — AB (ref 4.22–5.81)
RDW: 17.9 % — ABNORMAL HIGH (ref 11.5–15.5)
WBC: 26.4 10*3/uL — AB (ref 4.0–10.5)

## 2015-05-29 LAB — I-STAT CHEM 8, ED
BUN: 59 mg/dL — AB (ref 6–20)
CALCIUM ION: 1.04 mmol/L — AB (ref 1.13–1.30)
Chloride: 94 mmol/L — ABNORMAL LOW (ref 101–111)
Creatinine, Ser: 6.7 mg/dL — ABNORMAL HIGH (ref 0.61–1.24)
Glucose, Bld: 91 mg/dL (ref 65–99)
HEMATOCRIT: 30 % — AB (ref 39.0–52.0)
HEMOGLOBIN: 10.2 g/dL — AB (ref 13.0–17.0)
Potassium: 4.2 mmol/L (ref 3.5–5.1)
SODIUM: 133 mmol/L — AB (ref 135–145)
TCO2: 23 mmol/L (ref 0–100)

## 2015-05-29 LAB — COMPREHENSIVE METABOLIC PANEL
ALBUMIN: 1.9 g/dL — AB (ref 3.5–5.0)
ALT: 25 U/L (ref 17–63)
AST: 33 U/L (ref 15–41)
Alkaline Phosphatase: 118 U/L (ref 38–126)
Anion gap: 19 — ABNORMAL HIGH (ref 5–15)
BUN: 59 mg/dL — AB (ref 6–20)
CHLORIDE: 95 mmol/L — AB (ref 101–111)
CO2: 21 mmol/L — AB (ref 22–32)
CREATININE: 6.9 mg/dL — AB (ref 0.61–1.24)
Calcium: 8.6 mg/dL — ABNORMAL LOW (ref 8.9–10.3)
GFR calc Af Amer: 8 mL/min — ABNORMAL LOW (ref >60)
GFR, EST NON AFRICAN AMERICAN: 7 mL/min — AB (ref >60)
GLUCOSE: 97 mg/dL (ref 65–99)
POTASSIUM: 4.6 mmol/L (ref 3.5–5.1)
SODIUM: 135 mmol/L (ref 135–145)
Total Bilirubin: 0.7 mg/dL (ref 0.3–1.2)
Total Protein: 5.9 g/dL — ABNORMAL LOW (ref 6.5–8.1)

## 2015-05-29 LAB — I-STAT TROPONIN, ED: TROPONIN I, POC: 0.06 ng/mL (ref 0.00–0.08)

## 2015-05-29 LAB — PROTIME-INR
INR: 2.44 — AB (ref 0.00–1.49)
PROTHROMBIN TIME: 26.2 s — AB (ref 11.6–15.2)

## 2015-05-29 LAB — I-STAT CG4 LACTIC ACID, ED: LACTIC ACID, VENOUS: 3.74 mmol/L — AB (ref 0.5–2.0)

## 2015-05-29 MED ORDER — SODIUM CHLORIDE 0.9 % IV BOLUS (SEPSIS)
2000.0000 mL | Freq: Once | INTRAVENOUS | Status: AC
  Administered 2015-05-29: 2000 mL via INTRAVENOUS

## 2015-05-29 MED ORDER — DILTIAZEM LOAD VIA INFUSION
15.0000 mg | Freq: Once | INTRAVENOUS | Status: AC
  Administered 2015-05-30: 5 mg via INTRAVENOUS
  Filled 2015-05-29: qty 15

## 2015-05-29 MED ORDER — ACETAMINOPHEN 650 MG RE SUPP: 650.0000 mg | Freq: Four times a day (QID) | RECTAL | Status: DC | PRN

## 2015-05-29 MED ORDER — WARFARIN 1.25 MG HALF TABLET
1.2500 mg | ORAL_TABLET | Freq: Once | ORAL | Status: AC
  Administered 2015-05-30: 1.25 mg via ORAL
  Filled 2015-05-29: qty 1

## 2015-05-29 MED ORDER — SEVELAMER CARBONATE 800 MG PO TABS
1600.0000 mg | ORAL_TABLET | Freq: Three times a day (TID) | ORAL | Status: DC
  Administered 2015-05-30 (×2): 1600 mg via ORAL
  Filled 2015-05-29 (×2): qty 2

## 2015-05-29 MED ORDER — SODIUM CHLORIDE 0.9 % IV BOLUS (SEPSIS)
500.0000 mL | Freq: Once | INTRAVENOUS | Status: AC
  Administered 2015-05-29: 500 mL via INTRAVENOUS

## 2015-05-29 MED ORDER — ACETAMINOPHEN 325 MG PO TABS: 650.0000 mg | ORAL_TABLET | Freq: Four times a day (QID) | ORAL | Status: DC | PRN

## 2015-05-29 MED ORDER — VANCOMYCIN HCL IN DEXTROSE 1-5 GM/200ML-% IV SOLN: 1000.0000 mg | Freq: Once | INTRAVENOUS | Status: DC

## 2015-05-29 MED ORDER — SODIUM CHLORIDE 0.9 % IV SOLN
1500.0000 mg | Freq: Once | INTRAVENOUS | Status: AC
  Administered 2015-05-29: 1500 mg via INTRAVENOUS
  Filled 2015-05-29: qty 1500

## 2015-05-29 MED ORDER — RISAQUAD PO CAPS
1.0000 | ORAL_CAPSULE | Freq: Every day | ORAL | Status: DC
  Administered 2015-05-29 – 2015-05-30 (×2): 1 via ORAL
  Filled 2015-05-29 (×2): qty 1

## 2015-05-29 MED ORDER — SENNA 8.6 MG PO TABS
1.0000 | ORAL_TABLET | Freq: Two times a day (BID) | ORAL | Status: DC
  Administered 2015-05-30 (×2): 8.6 mg via ORAL
  Filled 2015-05-29 (×3): qty 1

## 2015-05-29 MED ORDER — SODIUM CHLORIDE 0.9% FLUSH
3.0000 mL | Freq: Two times a day (BID) | INTRAVENOUS | Status: DC
  Administered 2015-05-29 – 2015-05-31 (×4): 3 mL via INTRAVENOUS

## 2015-05-29 MED ORDER — ONDANSETRON HCL 4 MG PO TABS: 4.0000 mg | ORAL_TABLET | Freq: Four times a day (QID) | ORAL | Status: DC | PRN

## 2015-05-29 MED ORDER — METOPROLOL SUCCINATE ER 100 MG PO TB24
50.0000 mg | ORAL_TABLET | Freq: Every day | ORAL | Status: DC
  Administered 2015-05-30: 50 mg via ORAL
  Filled 2015-05-29: qty 1

## 2015-05-29 MED ORDER — PIPERACILLIN-TAZOBACTAM 3.375 G IVPB 30 MIN: 3.3750 g | Freq: Once | INTRAVENOUS | Status: DC

## 2015-05-29 MED ORDER — GABAPENTIN 300 MG PO CAPS
300.0000 mg | ORAL_CAPSULE | Freq: Two times a day (BID) | ORAL | Status: DC
  Administered 2015-05-29 – 2015-05-31 (×4): 300 mg via ORAL
  Filled 2015-05-29 (×5): qty 1

## 2015-05-29 MED ORDER — GABAPENTIN 600 MG PO TABS
300.0000 mg | ORAL_TABLET | Freq: Two times a day (BID) | ORAL | Status: DC
  Filled 2015-05-29: qty 0.5

## 2015-05-29 MED ORDER — FENTANYL CITRATE (PF) 100 MCG/2ML IJ SOLN
50.0000 ug | INTRAMUSCULAR | Status: DC | PRN
  Filled 2015-05-29: qty 2

## 2015-05-29 MED ORDER — FENTANYL CITRATE (PF) 100 MCG/2ML IJ SOLN
25.0000 ug | INTRAMUSCULAR | Status: DC | PRN
  Administered 2015-05-29 – 2015-05-30 (×4): 25 ug via INTRAVENOUS
  Filled 2015-05-29 (×4): qty 2

## 2015-05-29 MED ORDER — ONDANSETRON HCL 4 MG/2ML IJ SOLN: 4.0000 mg | Freq: Four times a day (QID) | INTRAMUSCULAR | Status: DC | PRN

## 2015-05-29 MED ORDER — WARFARIN - PHARMACIST DOSING INPATIENT: Freq: Every day | Status: DC

## 2015-05-29 MED ORDER — PIPERACILLIN-TAZOBACTAM IN DEX 2-0.25 GM/50ML IV SOLN
2.2500 g | Freq: Three times a day (TID) | INTRAVENOUS | Status: DC
  Administered 2015-05-30 (×3): 2.25 g via INTRAVENOUS
  Filled 2015-05-29 (×6): qty 50

## 2015-05-29 MED ORDER — TRAMADOL HCL 50 MG PO TABS
50.0000 mg | ORAL_TABLET | Freq: Once | ORAL | Status: AC
  Administered 2015-05-29: 50 mg via ORAL
  Filled 2015-05-29: qty 1

## 2015-05-29 MED ORDER — PIPERACILLIN-TAZOBACTAM 3.375 G IVPB 30 MIN
3.3750 g | Freq: Once | INTRAVENOUS | Status: AC
  Administered 2015-05-29: 3.375 g via INTRAVENOUS
  Filled 2015-05-29: qty 50

## 2015-05-29 MED ORDER — ASPIRIN EC 81 MG PO TBEC
81.0000 mg | DELAYED_RELEASE_TABLET | Freq: Every day | ORAL | Status: DC
  Administered 2015-05-29 – 2015-05-30 (×2): 81 mg via ORAL
  Filled 2015-05-29 (×3): qty 1

## 2015-05-29 MED ORDER — HYDROMORPHONE HCL 1 MG/ML IJ SOLN: 1.0000 mg | INTRAMUSCULAR | Status: DC | PRN

## 2015-05-29 MED ORDER — DILTIAZEM HCL 100 MG IV SOLR
5.0000 mg/h | INTRAVENOUS | Status: DC
  Administered 2015-05-30: 5 mg/h via INTRAVENOUS
  Filled 2015-05-29: qty 100

## 2015-05-29 NOTE — ED Provider Notes (Signed)
CSN: [id number]     Arrival date & time 05/17/2015  1656 History   First MD Initiated Contact with Patient 05/27/2015 1701     Chief Complaint  Patient presents with  . Weakness     (Consider location/radiation/quality/duration/timing/severity/associated sxs/prior Treatment) Patient is a 75 y.o. male presenting with weakness. The history is provided by the patient and the spouse.  Weakness This is a new problem. The current episode started in the past 7 days. The problem occurs constantly. The problem has been rapidly worsening. Associated symptoms include chills, coughing and weakness. Pertinent negatives include no abdominal pain, chest pain, fever, nausea, neck pain or vomiting. Nothing aggravates the symptoms. He has tried nothing for the symptoms. The treatment provided no relief.    Past Medical History  Diagnosis Date  . Atrial fibrillation, persistent ([place])     a. cardioversion 11/21/10 . b. 05/2013 - experiencing more SOB. 24-hr holter showed avg HR 49, max 82, min 38bpm. ETT showed baseline junctional bradycardia, HR incr only to 90bpm with drop in BP. Saw EP - amiodarone reduced and eventually d/c'd with improvement in dyspnea.  . Hypertension   . Warfarin anticoagulation   . Aortic valve stenosis   . Orthostatic hypotension   . Diastolic dysfunction   . Arthritis   . Self-catheterizes urinary bladder   . Junctional bradycardia   . NSVT (nonsustained ventricular tachycardia) ([employer])     a. By holter 01/2014.  [name] Shortness of breath dyspnea   . PVD (peripheral vascular disease) ([place])     critical limb ischemia  . Cardiomyopathy ([place])     non-ischemic  . Hypomagnesemia   . A-V fistula ([place])   . CAD (coronary artery disease) 12/16/14    a. cath - nonobstrucive CAD 30% pro RCA; 25% dis RCA; 40T pro Lcx  . Heart murmur   . Dysrhythmia   . CHF (congestive heart failure) ([place])   . Full dentures   . Uses hearing aid   . Chronic diarrhea     takes imodium BID  . Pneumonia     hx  of  . Hemodialysis patient [facility])     M,W,F - dialysis initiated August 2016  . Bruises easily   . Bladder cancer ([place])     a. 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  . 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 CKD (chronic kidney disease), stage V ([place])     a. Followed by Dr. Jimmy Footman. b. IV fistula placed 04/17/14. c. initiation of HD August 2016  . RTA (renal tubular acidosis)   . ESRD (end stage renal disease) on dialysis [facility])     "MWF; [name]" (03/05/2015)  . Paravalvular leak of prosthetic heart valve    Past Surgical History  Procedure Laterality Date  . Cardioversion  11/21/2010  . Cystectomy  2002  . Cholecystectomy    . Cataract extraction w/ intraocular lens  implant, bilateral    . Colonoscopy    . Av fistula placement Left 04/17/2014    Procedure: ARTERIOVENOUS (AV) FISTULA CREATION- LEFT UPPER ARM ;  Surgeon: Mal [name], MD;  Location: [place];  Service: Vascular;  Laterality: Left;  . Bladder surgery  2002    radical cystectomy  . Vascular surgery    . Multiple tooth extractions    . Cardiac catheterization N/A 12/16/2014    Procedure: Right/Left Heart Cath and  Coronary Angiography;  Surgeon: [name], MD;  Location: [place] CV LAB;  Service: Cardiovascular;  Laterality: N/A;  . Eye surgery    . Colon surgery  Nov 2002    reconstructed bladder from colon. per pt. at [facility]  . Tee without cardioversion N/A 01/01/2015    Procedure: TRANSESOPHAGEAL ECHOCARDIOGRAM (TEE);  Surgeon: [name], MD;  Location[facility] ENDOSCOPY;  Service: Cardiovascular;  Laterality: N/A;  . Transcatheter aortic valve replacement, transapical N/A 02/16/2015    Procedure: TRANSCATHETER AORTIC VALVE REPLACEMENT, TRANSAPICAL;  Surgeon: [name], MD;  Location: [place];  Service: Open Heart Surgery;  Laterality: N/A;  . Tee without cardioversion N/A  02/16/2015    Procedure: TRANSESOPHAGEAL ECHOCARDIOGRAM (TEE);  Surgeon: [name], MD;  Location: [place];  Service: Open Heart Surgery;  Laterality: N/A;   Family History  Problem Relation Age of Onset  . CVA Mother   . Heart attack Father   . Heart disease Father   . Arrhythmia Father   . Alzheimer's disease Sister   . Multiple sclerosis Brother   . Alcohol abuse Father    Social History  Substance Use Topics  . Smoking status: Former Smoker -- 2.00 packs/day for 33 years    Types: Cigarettes    Quit date: 05/02/1983  . Smokeless tobacco: Never Used  . Alcohol Use: Yes     Comment: rare    Review of Systems  Constitutional: Positive for chills. Negative for fever.  HENT: Negative.   Eyes: Negative for visual disturbance.  Respiratory: Positive for cough. Negative for shortness of breath.   Cardiovascular: Positive for leg swelling. Negative for chest pain.  Gastrointestinal: Negative for nausea, vomiting, abdominal pain and diarrhea.  Genitourinary: Positive for enuresis.  Musculoskeletal: Negative.  Negative for neck pain and neck stiffness.  Skin: Positive for color change, pallor and wound.  Neurological: Positive for weakness.      Allergies  Amiodarone and Oxycodone hcl  Home Medications   Prior to Admission medications   Medication Sig Start Date End Date Taking? Authorizing Provider  acetaminophen (TYLENOL) 500 MG tablet Take 1,000 mg by mouth every 6 (six) hours as needed for mild pain or moderate pain.   Yes Historical Provider, MD  aspirin EC 81 MG tablet Take 81 mg by mouth at bedtime.   Yes Historical Provider, MD  Loperamide-Simethicone (IMODIUM MULTI-SYMPTOM RELIEF PO) Take 1 tablet by mouth 2 (two) times daily.   Yes Historical Provider, MD  metoprolol succinate (TOPROL-XL) 50 MG 24 hr tablet Take 50 mg by mouth at bedtime. Take with or immediately following a meal.   Yes Historical Provider, MD  multivitamin (RENA-VIT) TABS tablet Take 1 tablet  by mouth daily.   Yes Historical Provider, MD  Povidone-Iodine (BETADINE EX) Apply 1 application topically every other day. Apply to hard-crusted wounds   Yes Historical Provider, MD  Probiotic Product (ALIGN PO) Take 1 tablet by mouth daily.   Yes Historical Provider, MD  sevelamer carbonate (RENVELA) 800 MG tablet Take 1,600 mg by mouth 3 (three) times daily with meals.    Yes Historical Provider, MD  Silver-Carboxymethylcellulose (AQUACEL-AG EXTRA HYDROFIBER EX) Apply 3 patches topically every other day. Apply to weeping wounds   Yes Historical Provider, MD  traMADol (ULTRAM) 50 MG tablet Take 50 mg by mouth every 6 (six) hours as needed (pain).   Yes Historical Provider, MD  warfarin (COUMADIN) 2.5 MG tablet Take as directed by coumadin clinic Patient taking differently: Take 1.25 mg  by mouth at bedtime. Take as directed by coumadin clinic 04/30/15  Yes Sueanne Margarita, MD  diltiazem (DILTIAZEM CD) 180 MG 24 hr capsule Take 1 capsule (180 mg total) by mouth daily. Patient not taking: Reported on 05/21/2015 03/24/15   Will Meredith Leeds, MD  metoprolol tartrate (LOPRESSOR) 25 MG tablet Take 2 tablets (50 mg total) by mouth every morning.  Take 1 tablet (25 mg total) by mouth every evening. Patient not taking: Reported on 05/10/2015 03/24/15   Will Meredith Leeds, MD   BP 86/57 mmHg  Pulse 135  Temp(Src) 97.9 F (36.6 C) (Oral)  Resp 20  Ht 5\' 10"  (1.778 m)  Wt 73 kg  BMI 23.09 kg/m2  SpO2 100% Physical Exam  Constitutional: He is oriented to person, place, and time. He appears cachectic. He has a sickly appearance.  HENT:  Head: Normocephalic and atraumatic.  Eyes: Pupils are equal, round, and reactive to light. Scleral icterus is present.  Neck: Normal range of motion. Neck supple.  Cardiovascular: Normal heart sounds.  Tachycardia present.   Unable to palpate lower extremity distal pulses.  Pulmonary/Chest: Effort normal. He has no wheezes. He has no rales.  Abdominal: Soft. He  exhibits no distension. There is no tenderness. There is no rebound and no guarding.  Musculoskeletal:  Multiple areas of ischemic changes to all 4 distal extremities. Multiple nonhealing wounds to the lower extremity as well as large pitting edema.  Neurological: He is alert and oriented to person, place, and time. No cranial nerve deficit. Coordination normal.  Skin: No rash noted. There is erythema. There is pallor.  Psychiatric: He has a normal mood and affect.  Nursing note and vitals reviewed.   ED Course  Procedures (including critical care time) Labs Review Labs Reviewed  COMPREHENSIVE METABOLIC PANEL - Abnormal; Notable for the following:    Chloride 95 (*)    CO2 21 (*)    BUN 59 (*)    Creatinine, Ser 6.90 (*)    Calcium 8.6 (*)    Total Protein 5.9 (*)    Albumin 1.9 (*)    GFR calc non Af Amer 7 (*)    GFR calc Af Amer 8 (*)    Anion gap 19 (*)    All other components within normal limits  CBC WITH DIFFERENTIAL/PLATELET - Abnormal; Notable for the following:    WBC 26.4 (*)    RBC 2.64 (*)    Hemoglobin 9.2 (*)    HCT 29.6 (*)    MCV 112.1 (*)    MCH 34.8 (*)    RDW 17.9 (*)    Neutro Abs 23.5 (*)    Monocytes Absolute 1.1 (*)    All other components within normal limits  PROTIME-INR - Abnormal; Notable for the following:    Prothrombin Time 26.2 (*)    INR 2.44 (*)    All other components within normal limits  I-STAT CG4 LACTIC ACID, ED - Abnormal; Notable for the following:    Lactic Acid, Venous 3.74 (*)    All other components within normal limits  I-STAT CHEM 8, ED - Abnormal; Notable for the following:    Sodium 133 (*)    Chloride 94 (*)    BUN 59 (*)    Creatinine, Ser 6.70 (*)    Calcium, Ion 1.04 (*)    Hemoglobin 10.2 (*)    HCT 30.0 (*)    All other components within normal limits  I-STAT VENOUS BLOOD GAS, ED - Abnormal;  Notable for the following:    pH, Ven 7.335 (*)    pCO2, Ven 44.1 (*)    All other components within normal limits   CULTURE, BLOOD (ROUTINE X 2)  CULTURE, BLOOD (ROUTINE X 2)  BASIC METABOLIC PANEL  CBC  PROTIME-INR  LACTIC ACID, PLASMA  LACTIC ACID, PLASMA  MAGNESIUM  TSH  I-STAT TROPOININ, ED    Imaging Review Dg Chest Portable 1 View  05/15/2015  CLINICAL DATA:  75 year old with end-stage renal disease on hemodialysis, patient missed dialysis yesterday, presenting with 2-3 day history of generalized weakness. Hypotension and tachycardia upon presentation. EXAM: PORTABLE CHEST 1 VIEW COMPARISON:  03/23/2015 and earlier. FINDINGS: Cardiac silhouette moderately enlarged for AP portable technique, unchanged. Prior TAVR procedure. Dense mitral annular calcification is noted previously. Prominent paracardiac fat pad on the left. Lungs clear. Bronchovascular markings normal. Pulmonary vascularity normal. No visible pleural effusions. No pneumothorax. IMPRESSION: Stable cardiomegaly.  No acute cardiopulmonary disease. Electronically Signed   By: Evangeline Dakin M.D.   On: 05/08/2015 17:41   Dg Foot Complete Left  05/09/2015  CLINICAL DATA:  Severe weakness in both legs for 3 days. Pain and swelling mostly in the left foot. Bilateral leg pain. EXAM: LEFT FOOT - COMPLETE 3+ VIEW COMPARISON:  None. FINDINGS: Examination is limited due patient positioning. Diffuse bone demineralization. No acute fracture or dislocation. There is soft tissue swelling over the dorsum of the left foot and ankle and over the plantar aspect of the forefoot. No soft tissue gas collections or radiopaque foreign bodies are identified. There is evidence of underlying bone erosion with cortical loss and bone sclerosis involving the second and third tarsometatarsal joints with focal cortical loss at the dorsal aspect of the middle cuneiform. There is also focal erosion demonstrated in the base of the first metatarsal bone. These changes are suspicious for osteomyelitis. Diffuse vascular calcification consistent with sides. IMPRESSION: Diffuse  soft tissue swelling over the left foot vertically at the forefoot region. Cortical loss, focal bone destruction, and sclerosis involving the second and third metatarsal phalangeal joints and focal bone erosion involving the proximal aspect of the first metatarsal bone. Appearance is suspicious for osteomyelitis. Electronically Signed   By: Lucienne Capers M.D.   On: 05/24/2015 20:55   Dg Foot Complete Right  05/27/2015  CLINICAL DATA:  Severe weakness in both legs for 3 days. More pain in the left foot. Left foot is swollen. Bilateral leg pain. EXAM: RIGHT FOOT COMPLETE - 3+ VIEW COMPARISON:  None. FINDINGS: Diffuse bone demineralization. No evidence of acute fracture or dislocation. Soft tissue swelling and soft tissue gas adjacent to the first metatarsal-phalangeal joint. This suggest infection/ cellulitis due to gas-forming organism. Underlying bones appear intact. There is no evidence of bone loss or cortical destruction or sclerosis that would suggest evidence of osteomyelitis. MRI would be more sensitive if clinically indicated. Plantar calcaneal spur. Extensive vascular calcifications consistent with diabetes. IMPRESSION: Soft tissue swelling and soft tissue gas around the first metatarsal-phalangeal joint suggesting cellulitis with gas-forming organism. No bone changes that would suggest osteomyelitis. Electronically Signed   By: Lucienne Capers M.D.   On: 05/31/2015 20:52   I have personally reviewed and evaluated these images and lab results as part of my medical decision-making.   EKG Interpretation None      MDM   Final diagnoses:  Severe sepsis [facility])     EMERGENCY DEPARTMENT [place] CARDIAC EXAM "Study: Limited Ultrasound of the heart and pericardium"  INDICATIONS:Hypotension and Tachycardia Multiple views  of the heart and pericardium were obtained in real-time with a multi-frequency probe.  PERFORMED [id number] and Other (See attached note)  IMAGES ARCHIVED?: Yes  FINDINGS: No  pericardial effusion, Hyperdynamic contractility and IVC dilated  LIMITATIONS:  Body habitus and Emergent procedure  VIEWS USED: Subcostal 4 chamber, Parasternal long axis, Parasternal short axis, Apical 4 chamber  and Inferior Vena Cava  INTERPRETATION: Cardiac activity present, Pericardial effusioin absent and Probable elevated CVP  Patient is a 75 year old male with multiple medical problems who presents with worsening fatigue for 4 days. He missed his dialysis yesterday due to this. Otherwise denies any fevers or diarrhea. Further history and exam as above notable for tachycardia, hypotension but otherwise at his baseline mental status. He has multiple ischemic areas of all 4 extremities with multiple wounds due to his underlying vascular disease.  Patient found to have a leukocytosis with elevated lactate. Also with a hemoglobin 9.2. Concern for severe sepsis at this time likely 2/2 multiple ischemic distal extremities and gangrene. vanc and Zosyn will be started as well as IV fluids. Patient is a DNR/DNI thus critical care consulted and patient will be admitted to stepdown unit for further management and evaluation.    Heriberto Antigua, MD 05/30/15 0005  Elnora Morrison, MD 05/30/15 2221

## 2015-05-29 NOTE — Consult Note (Signed)
PULMONARY / CRITICAL CARE MEDICINE   Name: [name] MRN: [id number] DOB: December 05, 1940    ADMISSION DATE:  05/10/2015 CONSULTATION DATE:  05/11/2015  REFERRING MD :  Dr. Zenia [name]  CHIEF COMPLAINT:  Leg weakness   HISTORY OF PRESENT ILLNESS:   75 yoCM with extensive Pmed history including AS (s/p 29 [place] TAVR 01/2015), Afib on warfarin, PVD, ESRD (minimal urine production) on HD (M/W/F).  He states that on Wed 1/25 he underwent his usual HD session and completed the full run without complication.  On Thursday 1/26 morning he tried to get up and had profound weakness in his legs b/l, his legs gave out while walking and he fell and hit is head on his right orbit.  He did not lose consciousness and has no residual symptoms from this.  Since that time his legs have become progressively weaker and now he is unable to stand.  So he came to the ED.  In the ED he was found to be hypotensive and tachycardic.  Critical care is consulted for possible ICU admission.  Of note: He denies fevers, chills, chest pain SOB, presyncopal symptoms. He reports severe pain in his feet b/l. States his baseline SBP is 90's - 120's. Pt states he is DNR/DNI and would not want invasive catheters such as CVC or Arterial lines for vasopressors.  PAST MEDICAL HISTORY :   has a past medical history of Atrial fibrillation, persistent ([place]); Hypertension; Warfarin anticoagulation; Aortic valve stenosis; Orthostatic hypotension; Diastolic dysfunction; Arthritis; Self-catheterizes urinary bladder; Junctional bradycardia; NSVT (nonsustained ventricular tachycardia) ([place]); Shortness of breath dyspnea; PVD (peripheral vascular disease) ([place]); Cardiomyopathy ([place]); Hypomagnesemia; A-V fistula ([facility]); CAD (coronary artery disease) (12/16/14); Heart murmur; Dysrhythmia; CHF (congestive heart failure) ([place]); Full dentures; Uses hearing aid; Chronic diarrhea; Pneumonia; Hemodialysis patient [facility]); Bruises easily; Bladder cancer ([place]);  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  has past surgical history that includes Cardioversion (11/21/2010); Cystectomy; Cholecystectomy; Cataract extraction w/ intraocular lens  implant, bilateral; Colonoscopy; AV fistula placement (Left, 04/17/2014); Bladder surgery; Vascular surgery; Multiple tooth extractions; Cardiac catheterization (N/A, 12/16/2014); Eye surgery; Colon surgery (Nov 2002); TEE without cardioversion (N/A, 01/01/2015); Transcatheter aortic valve replacement, transapical (N/A, 02/16/2015); and TEE without cardioversion (N/A, 02/16/2015). Prior to Admission medications   Medication Sig Start Date End Date Taking? Authorizing Provider  acetaminophen (TYLENOL) 500 MG tablet Take 1,000 mg by mouth every 6 (six) hours as needed for mild pain or moderate pain.   Yes Historical Provider, MD  aspirin EC 81 MG tablet Take 81 mg by mouth at bedtime.   Yes Historical Provider, MD  Loperamide-Simethicone (IMODIUM MULTI-SYMPTOM RELIEF PO) Take 1 tablet by mouth 2 (two) times daily.   Yes Historical Provider, MD  metoprolol succinate (TOPROL-XL) 50 MG 24 hr tablet Take 50 mg by mouth at bedtime. Take with or immediately following a meal.   Yes Historical Provider, MD  multivitamin (RENA-VIT) TABS tablet Take 1 tablet by mouth daily.   Yes Historical Provider, MD  Povidone-Iodine (BETADINE EX) Apply 1 application topically every other day. Apply to hard-crusted wounds   Yes Historical Provider, MD  Probiotic Product (ALIGN PO) Take 1 tablet by mouth daily.   Yes Historical Provider, MD  sevelamer carbonate (RENVELA) 800 MG tablet Take 1,600 mg by mouth 3 (three) times daily with meals.    Yes Historical Provider, MD  Silver-Carboxymethylcellulose (AQUACEL-AG EXTRA HYDROFIBER EX) Apply 3 patches topically every  other day. Apply to weeping wounds   Yes Historical Provider, MD  traMADol (ULTRAM) 50 MG tablet Take 50 mg by mouth every 6 (six) hours as needed (pain).   Yes Historical Provider, MD  warfarin (COUMADIN) 2.5 MG tablet Take as directed by coumadin clinic Patient taking differently: Take 1.25 mg by mouth at bedtime. Take as directed by coumadin clinic 04/30/15  Yes Sueanne Margarita, MD  diltiazem (DILTIAZEM CD) 180 MG 24 hr capsule Take 1 capsule (180 mg total) by mouth daily. Patient not taking: Reported on 05/20/2015 03/24/15   Will Meredith Leeds, MD  metoprolol tartrate (LOPRESSOR) 25 MG tablet Take 2 tablets (50 mg total) by mouth every morning.  Take 1 tablet (25 mg total) by mouth every evening. Patient not taking: Reported on 05/13/2015 03/24/15   Will Meredith Leeds, MD   Allergies  Allergen Reactions  . Amiodarone Other (See Comments)    Severely reduced DLCO  . Oxycodone Hcl Rash and Other (See Comments)    Rash and itching    FAMILY HISTORY:  indicated that his mother is deceased. He indicated that his father is deceased. He indicated that his sister is deceased. He indicated that only one of his two brothers is alive. He indicated that his maternal grandmother is deceased. He indicated that his maternal grandfather is deceased. He indicated that his paternal grandmother is deceased. He indicated that his paternal grandfather is deceased.  SOCIAL HISTORY:  reports that he quit smoking about 32 years ago. His smoking use included Cigarettes. He has a 66 pack-year smoking history. He has never used smokeless tobacco. He reports that he drinks alcohol. He reports that he does not use illicit drugs.  REVIEW OF SYSTEMS:   Denies: Fever, chills, n/v/d, SOB, DOE, chest pain, headache, visual changes, abdominal pain. Reports: B/l leg pain, b/l leg weakness. B/l foot pain.  SUBJECTIVE:   VITAL SIGNS: Pulse Rate:  [25-65] 25 (01/28 1900) Resp:  [17-28] 28 (01/28 1900) BP:  (79-125)/(55-106) 93/69 mmHg (01/28 1900) SpO2:  [70 %-92 %] 89 % (01/28 1900) HEMODYNAMICS:   VENTILATOR SETTINGS:   INTAKE / OUTPUT:  Intake/Output Summary (Last 24 hours) at 05/06/2015 1941 Last data filed at 05/15/2015 1823  Gross per 24 hour  Intake      0 ml  Output    350 ml  Net   -350 ml    PHYSICAL EXAMINATION: General:  Ill appearing, thin, NAD Neuro:  CN II-XII intact, no focal deficits HEENT:  Right orbital ecchymosis, healed right infra-orbital laceration.  Cardiovascular:  Irreg rhythm, Tachycardic, + holosystolic murmur across precordium 3/6 Lungs:  CTA b/l no w/r/r Abdomen:  Soft, no TTP Musculoskeletal:   - Left foot: 2+ pitting edema, no palpable DP pulse (dopplerable), dry gangrene/necrosis of the digits  - Right foot: 2+ pitting edema, palpable DP pulse, dry gangrene/necrosis of the digits.  Necrotic, foul smelling  wound on the achilles area/ supra-calcaneal. Draining frank pus. Skin:  As above.  Cool LE b/l, no palpable crepitus.  LABS:  CBC  Recent Labs Lab 05/17/2015 1720 05/13/2015 1739  WBC 26.4*  --   HGB 9.2* 10.2*  HCT 29.6* 30.0*  PLT 248  --    Coag's No results for input(s): APTT, INR in the last 168 hours. BMET  Recent Labs Lab 05/06/2015 1720 05/28/2015 1739  NA 135 133*  K 4.6 4.2  CL 95* 94*  CO2 21*  --  BUN 59* 59*  CREATININE 6.90* 6.70*  GLUCOSE 97 91   Electrolytes  Recent Labs Lab 05/23/2015 1720  CALCIUM 8.6*   Sepsis Markers  Recent Labs Lab 05/20/2015 1740  LATICACIDVEN 3.74*   ABG No results for input(s): PHART, PCO2ART, PO2ART in the last 168 hours. Liver Enzymes  Recent Labs Lab 05/21/2015 1720  AST 33  ALT 25  ALKPHOS 118  BILITOT 0.7  ALBUMIN 1.9*   Cardiac Enzymes No results for input(s): TROPONINI, PROBNP in the last 168 hours. Glucose No results for input(s): GLUCAP in the last 168 hours.  Imaging Dg Chest Portable 1 View  05/11/2015  CLINICAL DATA:  75 year old with end-stage renal disease  on hemodialysis, patient missed dialysis yesterday, presenting with 2-3 day history of generalized weakness. Hypotension and tachycardia upon presentation. EXAM: PORTABLE CHEST 1 VIEW COMPARISON:  03/23/2015 and earlier. FINDINGS: Cardiac silhouette moderately enlarged for AP portable technique, unchanged. Prior TAVR procedure. Dense mitral annular calcification is noted previously. Prominent paracardiac fat pad on the left. Lungs clear. Bronchovascular markings normal. Pulmonary vascularity normal. No visible pleural effusions. No pneumothorax. IMPRESSION: Stable cardiomegaly.  No acute cardiopulmonary disease. Electronically Signed   By: Evangeline Dakin M.D.   On: 05/14/2015 17:41     ASSESSMENT / PLAN:  75 yo male with sepsis likely from cellulitis vs osteomyelitis of the right foot, other sources could be left pleural effusion, or urine though no pulmonary or renal symptoms to suggest this.   His elevated lactate is likely from his gangrenous feet in the setting of anuric ESRD and not due to systemic hypoperfusion as he has no other markers of end organ damage (LFTs WNL, mentating well, skin is warm well perfused, troponin negative).  He currently has no ICU needs and escalation of therapy beyond the capability of the stepdown unit is not within his goals of care.  Recommend: Stepdown  PULMONARY A: Left sided pleural effusion, is likely due to volume overload but would need to be explored as a possible source of infection. P:    - Thoracentesis after INR down trends given he is on warfarin  CARDIOVASCULAR A: Atrial fibrillation with RVR, currently his MAP is preserved.  Elevated lactate likely from gangrenous LE not systemic hypoperfusion as discussed above. P:  - volume resucitation @ 250cc/hr x4 hours - restart home diltiazem - trend lactate  RENAL A:  ESRD on HD.  AGMA likely due to elevated BUN and lactate. P:   - no indications for emergent HD - Consult nephrology, plan HD  tomorrow  GASTROINTESTINAL A:   No active issues.  HEMATOLOGIC A:  Leukocytosis 2/2 LE cellulitis vs Osteomyelitis.  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 P:  - treatment of sepsis  INFECTIOUS A:  Sepsis 2/2 gangrenous LE and possible cellulitis vs Osteomyelitis of right foot.  Currently do not suspect necrotizing fasciitis, though given his immunosuppression this would need to remain on the differential for now P:   - Xray of right foot ordered if negative likely needs MRI (without gadolinium) - Recommend consult to orthopedics - for further recommendations regarding imaging and source control - CRP ordered BCx2 1/28 UC 1/28 Sputum Ordered Abx: Vanc/zosyn/clindamycin  ENDOCRINE A:  No active issues   P:   - finger sticks and SSI PRN - Goal BG <180  NEUROLOGIC A:  S/p mechanical fall on warfarin with head injury.  No focal deficits though need to r/o subclinical subdural and/or orbital fracture. P:   - CT head non contrast ordered -  CT face non contrast ordered   FAMILY: Wife at bedside.  Updated patient and wife at bedside.   Total critical care time: 45 min  Critical care time was exclusive of separately billable procedures and treating other patients.  Critical care was necessary to treat or prevent imminent or life-threatening deterioration.  Critical care was time spent personally by me on the following activities: development of treatment plan with patient and/or surrogate as well as nursing, discussions with consultants, evaluation of patient's response to treatment, examination of patient, obtaining history from patient or surrogate, ordering and performing treatments and interventions, ordering and review of laboratory studies, ordering and review of radiographic studies, pulse oximetry and re-evaluation of patient's condition.   Meribeth Mattes, DO., MS [facility]       Pulmonary and [place] Pager: [phone]  05/21/2015, 7:41 PM

## 2015-05-29 NOTE — H&P (Signed)
History and Physical  Patient Name: [name]     [id number]    DOB: 09/26/1940    DOA: 05/26/2015 Referring physician: Dr. Lanetta [name] PCP: [name] Pel, MD      Chief Complaint: Weakness, progressive  HPI: [name] is a 75 y.o. male with a past medical history significant for ESRD on HD MWF, chronic diastolic CHF NYHA III and severe AS now s/p TAVR 01/2015, and worsening PVD with chronic leg wounds who presents with inability to walk.  Since starting dialysis 6 months ago, the patient has suffered from intra- and post-dialytic hypotension and also progressive fatigue and SOB with exertion.  This did not improve with TAVR and the patient and his wife note that it just seems to get steadily worse.    Now in the last week, his weakness has progressed.  Whereas he previously was able to walk short distances at home and only needed wheelchair when he left the house, this week he was so tired that he fell on Thursday, was too weak to leave the house for HD on Friday (his last HD was Weds 1/25), and then today couldn't get off the toilet.    In the ED, he was in Afib with RVR, but mentating well and appearing to oxygenate well.  He was evaluated by PCCM who diagnosed elevated lactic acid from progressive gangrene and recommended stepdown admission to Medicine given the patient's stated desires for no intubation or invasive lines.  The patient denied fever, chills, cough, sputum or change in dyspnea.  He had no confusion.  No diarrhea, abdominal pain, vomiting.  He had only worsening rest pain in both feet, worsening discharge from leg wounds, and weakness/inability to stand.       Review of Systems:  Pt complains of rest pain, leg drainage, weakness, chronic cough, chronic dyspnea with exertion, recent fall. Pt denies any fever, chills, confusion, focal weakness, seizures, passing out, sputum, worsening dyspnea, change in cough, abdominal pain, vomiting, diarrhea.  All other  systems negative except as just noted or noted in the history of present illness.  Allergies  Allergen Reactions  . Amiodarone Other (See Comments)    Severely reduced DLCO  . Oxycodone Hcl Rash and Other (See Comments)    Rash and itching    Prior to Admission medications   Medication Sig Start Date End Date Taking? Authorizing Provider  acetaminophen (TYLENOL) 500 MG tablet Take 1,000 mg by mouth every 6 (six) hours as needed for mild pain or moderate pain.   Yes Historical Provider, MD  aspirin EC 81 MG tablet Take 81 mg by mouth at bedtime.   Yes Historical Provider, MD  Loperamide-Simethicone (IMODIUM MULTI-SYMPTOM RELIEF PO) Take 1 tablet by mouth 2 (two) times daily.   Yes Historical Provider, MD  metoprolol succinate (TOPROL-XL) 50 MG 24 hr tablet Take 50 mg by mouth at bedtime. Take with or immediately following a meal.   Yes Historical Provider, MD  multivitamin (RENA-VIT) TABS tablet Take 1 tablet by mouth daily.   Yes Historical Provider, MD  Povidone-Iodine (BETADINE EX) Apply 1 application topically every other day. Apply to hard-crusted wounds   Yes Historical Provider, MD  Probiotic Product (ALIGN PO) Take 1 tablet by mouth daily.   Yes Historical Provider, MD  sevelamer carbonate (RENVELA) 800 MG tablet Take 1,600 mg by mouth 3 (three) times daily with meals.    Yes Historical Provider, MD  Silver-Carboxymethylcellulose (AQUACEL-AG EXTRA HYDROFIBER EX) Apply 3 patches topically every other  day. Apply to weeping wounds   Yes Historical Provider, MD  traMADol (ULTRAM) 50 MG tablet Take 50 mg by mouth every 6 (six) hours as needed (pain).   Yes Historical Provider, MD  warfarin (COUMADIN) 2.5 MG tablet Take as directed by coumadin clinic Patient taking differently: Take 1.25 mg by mouth at bedtime. Take as directed by coumadin clinic 04/30/15  Yes [name] Margarita, MD  diltiazem (DILTIAZEM CD) 180 MG 24 hr capsule Take 1 capsule (180 mg total) by mouth daily. Patient not taking:  Reported on 05/13/2015 03/24/15   [name] [name] Leeds, MD  metoprolol tartrate (LOPRESSOR) 25 MG tablet Take 2 tablets (50 mg total) by mouth every morning.  Take 1 tablet (25 mg total) by mouth every evening. Patient not taking: Reported on 05/14/2015 03/24/15   [name] [name] Leeds, MD    Past Medical History  Diagnosis Date  . Atrial fibrillation, persistent ([place])     a. cardioversion 11/21/10 . b. 05/2013 - experiencing more SOB. 24-hr holter showed avg HR 49, max 82, min 38bpm. ETT showed baseline junctional bradycardia, HR incr only to 90bpm with drop in BP. Saw EP - amiodarone reduced and eventually d/c'd with improvement in dyspnea.  . Hypertension   . Warfarin anticoagulation   . Aortic valve stenosis   . Orthostatic hypotension   . Diastolic dysfunction   . Arthritis   . Self-catheterizes urinary bladder   . Junctional bradycardia   . NSVT (nonsustained ventricular tachycardia) ([place])     a. By holter 01/2014.  [name] Shortness of breath dyspnea   . PVD (peripheral vascular disease) ([place])     critical limb ischemia  . Cardiomyopathy ([place])     non-ischemic  . Hypomagnesemia   . A-V fistula ([place])   . CAD (coronary artery disease) 12/16/14    a. cath - nonobstrucive CAD 30% pro RCA; 25% dis RCA; 40T pro Lcx  . Heart murmur   . Dysrhythmia   . CHF (congestive heart failure) ([place])   . Full dentures   . Uses hearing aid   . Chronic diarrhea     takes imodium BID  . Pneumonia     hx of  . Hemodialysis patient [facility])     M,W,F - dialysis initiated August 2016  . Bruises easily   . Bladder cancer ([place])     a. 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  . 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 CKD (chronic kidney disease), stage V ([place])     a. Followed by Dr. Jimmy [name]. b. IV fistula placed 04/17/14. c. initiation of HD August 2016  . RTA (renal tubular  acidosis)   . ESRD (end stage renal disease) on dialysis [facility])     "MWF; [name]" (03/05/2015)  . Paravalvular leak of prosthetic heart valve     Past Surgical History  Procedure Laterality Date  . Cardioversion  11/21/2010  . Cystectomy  2002  . Cholecystectomy    . Cataract extraction w/ intraocular lens  implant, bilateral    . Colonoscopy    . Av fistula placement Left 04/17/2014    Procedure: ARTERIOVENOUS (AV) FISTULA CREATION- LEFT UPPER ARM ;  Surgeon: Mal [name], MD;  Location: [place];  Service: Vascular;  Laterality: Left;  . Bladder surgery  2002    radical cystectomy  . Vascular surgery    . Multiple tooth  extractions    . Cardiac catheterization N/A 12/16/2014    Procedure: Right/Left Heart Cath and Coronary Angiography;  Surgeon: [name], MD;  Location: [place] [facility];  Service: Cardiovascular;  Laterality: N/A;  . Eye surgery    . Colon surgery  Nov 2002    reconstructed bladder from colon. per pt. at [facility]  . Tee without cardioversion N/A 01/01/2015    Procedure: TRANSESOPHAGEAL ECHOCARDIOGRAM (TEE);  Surgeon: [name], MD;  Location: [facility];  Service: Cardiovascular;  Laterality: N/A;  . Transcatheter aortic valve replacement, transapical N/A 02/16/2015    Procedure: TRANSCATHETER AORTIC VALVE REPLACEMENT, TRANSAPICAL;  Surgeon: [name], MD;  Location: [place];  Service: Open Heart Surgery;  Laterality: N/A;  . Tee without cardioversion N/A 02/16/2015    Procedure: TRANSESOPHAGEAL ECHOCARDIOGRAM (TEE);  Surgeon: [name], MD;  Location: [place];  Service: Open Heart Surgery;  Laterality: N/A;    Family history: family history includes Alcohol abuse in his father; Alzheimer's disease in his sister; Arrhythmia in his father; CVA in his mother; Heart attack in his father; Heart disease in his father; Multiple sclerosis in his brother.  Social History: Patient lives with his wife.  He is from [place], and was an Runner, broadcasting/film/video.  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  He smoked years ago but quit in 2002.  He used to walk without assistance, but now requires a walker or wheelchair until the present illness.         Physical Exam: BP 77/54 mmHg  Pulse 143  Resp 20  SpO2 92% General appearance: Frail adult male, alert and in no acute distress.   Eyes: Anicteric, right eye injected. Bruising around right eye.     ENT: No nasal deformity, discharge, or epistaxis.  Oral mucosa dry.   Lymph: No cervical or supraclavicular lymphadenopathy. Skin: Warm and dry.  There is cracking and gangrene of the skin of the hands.  On the right and left feet there are purulent open draining wounds with foul smell, the largest on the right heel.  There are numerous black and dry dessicated toes on both feet.  There are two sacral ulcers. Cardiac:Irregularly irregular, tachycardic, nl S1-S2, no murmurs appreciated by me.  Capillary refill is brisk.  2+ pitting LE edema to knee.  Radial pulses 1+ and symmetric, DP pulses not appreciated. Respiratory: Normal respiratory rate and rhythm.  CTAB without rales or wheezes, I do not appreicate diminished sounds on L. Abdomen: Abdomen soft without rigidity.  No TTP. Stoma visualized on right.  No ascites, distension.   MSK: No deformities or effusions. Neuro: Cranial nerves normal.  Sensorium intact and responding to questions, attention normal.  Speech is fluent. Globally weak, but moves all extremities equally.    Psych: Behavior appropriate.  Affect flat.  No evidence of aural or visual hallucinations or delusions.       Labs on Admission:  The metabolic panel shows mild acidosis, elevated AG. Transaminases and bilirubin are normal. Albumin markedly low. Blood cultures pending TNI normal Lactic acid 3.24 mmol/L The complete blood count shows leukocytosis 26.4K/uL, anemia Hgb 9.2 g/dL, close to baseline.  No thrombocytopenia.   Radiological Exams on  Admission: Personally reviewed: Dg Chest Portable 1 View 05/20/2015  Left lower opacity, similar to previous, favored not to be effusion.    Dg Foot Complete Left 05/20/2015 IMPRESSION: Diffuse soft tissue swelling over the left foot vertically at the forefoot region.  Cortical loss, focal bone destruction, and sclerosis involving the second and third metatarsal phalangeal joints and focal bone erosion involving the proximal aspect of the first metatarsal bone. Appearance is suspicious for osteomyelitis.    Dg Foot Complete Right 05/30/2015   IMPRESSION: Soft tissue swelling and soft tissue gas around the first metatarsal-phalangeal joint suggesting cellulitis with gas-forming organism. No bone changes that would suggest osteomyelitis.     EKG: Independently reviewed. Rate 140, atrial fibrillation with narrow complex.    Assessment/Plan 1. 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  Has received 2L (about 2/3rds) of 30 cc/kg bolus, [name] defer remainder per PCCM.  His appointment with Cardiology last month suggested that he has no intervenable lower extremity target for his PVD.  He currently appears to have progression of his chronic lower extremity arterial insufficiency related wounds.    With regard to amputation, the patient's stated preference to me is to "do something" even if it might hasten his death, if it gave him the chance of "a few more months" with his wife.  His wife is unable to care for him at home alone regardless of the outcome of this hospitalization.  [name] consult Cardiology re: preoperative clearance, although my suspicion is that this is not a risk anyone would be willing to take.  Re: Hospice, the patient has discussed this before, but  was previously unwilling to forego dialysis.   -Vancomycin and piperacillin-tazobactam for gangrene -Consult to Cardiology re: pre-operative clearance, appreciate recommendations -Consult to Vascular Surgery re: gangrene, appreciate cares -Consult to Palliative Care re: goals of care, appreciate cares -Consult to PT/OT and Care Management re: placement   2. Afib with RVR:  -Diltiazem gtt for now -Continue metoprolol -Fluid resuscitation as above  3. ESRD on HD MWF:  -Consult to Nephrology re: maintenance HD  4. Anemia of renal disease:  Stable -Trend CBC  5. Pleural effusion, possible:  This is not evident on exam, and I am not clear that there is an appreciable effusion here, and agree with PCCM that parapneumonic effusion or empyema are unlikely even if effusion is present.  Patient respiratory status at time of admission is stable.  Discussed reversal of warfarin and thoracentesis with Dr. Diona Fanti, but he has equipoise on this plan and I [name] defer given priority of above workup re: gangrene.  Warfarin could be reversed without regard for bioprosthetic AV.  6. Severe PCM:  -Renal diet -Consult to Palliative Care  7. Chronic diastolic CHF and severe AS s/p TAVR Oct 2016:  -Fluid optimization per Nephrology -Continue BB  8. Hyponatremia: Acute on chronic.  In setting of worsening gangrene. -Fluids and trend BMP      DVT PPx: Warfarin Diet: Renal Consultants: Cardiology, Vascular Surgery, Palliative Care, Nephrology Code Status: DO NOT RESUSCITATE Family Communication: Wife, present at bedside.  Current condition re: gangrene discussed.  Plan for Cardiology evaluation and Palliative Care evaluation discussed.  Consult with Vascular if cleared for surgery afterwards.  CODE STATUS confirmed.  Medical decision making: What exists of the patient's previous chart was reviewed in depth and the case was discussed with Dr. Diona Fanti, Dr. Lanetta [name] and Dr. Marlowe Sax (strictly by phone,  regarding AV). Patient seen 9:22 PM on 05/16/2015.  Disposition Plan:  I  recommend admission to [facility].  Clinical condition: poor, but stable.  Anticipate evaluation by Cardiology and Palliative Care.  I am not clear that the patient is a surgical candidate, which may conflict with patient's hope to "do something".  Expect discharge to SNF within 4-5 days, possibly with desisting dialysis and enrolling in Hospice.      Edwin Dada [facility] Pager [phone]

## 2015-05-29 NOTE — Progress Notes (Signed)
[place] Progress Note Patient Name: YANG GEGENHEIMER DOB: 03/19/1941 MRN: [id number]   Date of Service  05/28/2015  HPI/Events of Note  Hypotension - BP = 85/62.   eICU Interventions  Will bolus with 0.9 NaCl 500 mL IV over 30 minutes now.      Intervention Category Major Interventions: Hypotension - evaluation and management  Sommer,Steven Cornelia Copa 05/26/2015, 10:25 PM

## 2015-05-29 NOTE — Progress Notes (Signed)
Attempted to call for report. Nurse to return call

## 2015-05-29 NOTE — ED Notes (Signed)
The pt is c/o of weakness for the past 2-3  Days.  He is a dialysis pt that missed dialysis yesterday.  Fistula lt arm.  He fell Wednesday.at home  He lives with his wife.  Wounds to both his lower legs that he has had for 4 months  He has a very bad odor from the leg wounds initially his bp was 80/40 p 156 r28  02 sats 94%  Nasal 02 at 4 liters.  He is weak and has soreness all over his body.  Many bruises and swelling

## 2015-05-29 NOTE — Progress Notes (Signed)
Pharmacy Code Sepsis Protocol  Time of code sepsis page: [id number] [x]  Antibiotics delivered at 1802 []  Antibiotics administered prior to code at  (if checked, omit next 2 questions)  Were antibiotics ordered at the time of the code sepsis page? No Was it required to contact the physician? [x]  Physician not contacted []  Physician contacted to order antibiotics for code sepsis []  Physician contacted to recommend changing antibiotics  Pharmacy consulted for: vanc/zosyn  Anti-infectives    Start     Dose/Rate Route Frequency Ordered Stop   05/16/2015 1800  vancomycin (VANCOCIN) IVPB 1000 mg/200 mL premix  Status:  Discontinued     1,000 mg 200 mL/hr over 60 Minutes Intravenous  Once 05/07/2015 1749 05/07/2015 1757   05/17/2015 1800  piperacillin-tazobactam (ZOSYN) IVPB 3.375 g     3.375 g 100 mL/hr over 30 Minutes Intravenous  Once 05/02/2015 1749     05/28/2015 1800  piperacillin-tazobactam (ZOSYN) IVPB 3.375 g  Status:  Discontinued     3.375 g 100 mL/hr over 30 Minutes Intravenous  Once 05/30/2015 1753 05/12/2015 1753   05/24/2015 1800  vancomycin (VANCOCIN) IVPB 1000 mg/200 mL premix  Status:  Discontinued     1,000 mg 200 mL/hr over 60 Minutes Intravenous  Once 05/05/2015 1753 05/10/2015 1753   05/17/2015 1800  vancomycin (VANCOCIN) 1,500 mg in sodium chloride 0.9 % 500 mL IVPB     1,500 mg 250 mL/hr over 120 Minutes Intravenous  Once 05/31/2015 1757          Nurse education provided: [x]  Minutes left to administer antibiotics to achieve 1 hour goal [x]  Correct order of antibiotic administration [x]  Antibiotic Y-site compatibilities     Wilfred Lacy, PharmD 05/05/2015, 6:09 PM

## 2015-05-29 NOTE — ED Notes (Signed)
Pt states does not make urine.

## 2015-05-29 NOTE — Progress Notes (Addendum)
ANTICOAGULATION CONSULT NOTE - Initial Consult  Pharmacy Consult for Coumadin Indication: atrial fibrillation  Allergies  Allergen Reactions  . Amiodarone Other (See Comments)    Severely reduced DLCO  . Oxycodone Hcl Rash and Other (See Comments)    Rash and itching    Vital Signs: Temp Source: Oral (01/28 1701) BP: 77/54 mmHg (01/28 2111) Pulse Rate: 143 (01/28 2111)  Labs:  Recent Labs  05/15/2015 1720 05/26/2015 1739  HGB 9.2* 10.2*  HCT 29.6* 30.0*  PLT 248  --   CREATININE 6.90* 6.70*    CrCl cannot be calculated (Unknown ideal weight.).   Medical History: Past Medical History  Diagnosis Date  . Atrial fibrillation, persistent ([place])     a. cardioversion 11/21/10 . b. 05/2013 - experiencing more SOB. 24-hr holter showed avg HR 49, max 82, min 38bpm. ETT showed baseline junctional bradycardia, HR incr only to 90bpm with drop in BP. Saw EP - amiodarone reduced and eventually d/c'd with improvement in dyspnea.  . Hypertension   . Warfarin anticoagulation   . Aortic valve stenosis   . Orthostatic hypotension   . Diastolic dysfunction   . Arthritis   . Self-catheterizes urinary bladder   . Junctional bradycardia   . NSVT (nonsustained ventricular tachycardia) ([place])     a. By holter 01/2014.  [name] Shortness of breath dyspnea   . PVD (peripheral vascular disease) ([place])     critical limb ischemia  . Cardiomyopathy ([place])     non-ischemic  . Hypomagnesemia   . A-V fistula ([place])   . CAD (coronary artery disease) 12/16/14    a. cath - nonobstrucive CAD 30% pro RCA; 25% dis RCA; 40T pro Lcx  . Heart murmur   . Dysrhythmia   . CHF (congestive heart failure) ([place])   . Full dentures   . Uses hearing aid   . Chronic diarrhea     takes imodium BID  . Pneumonia     hx of  . Hemodialysis patient [facility])     M,W,F - dialysis initiated August 2016  . Bruises easily   . Bladder cancer ([place])     a. 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  . S/P TAVR  (transcatheter aortic valve replacement) 02/16/2015    29 mm Edwards Sapien XT transcatheter heart valve placed via transapical approach  . CKD (chronic kidney disease), stage V ([place])     a. Followed by Dr. Jimmy Footman. b. IV fistula placed 04/17/14. c. initiation of HD August 2016  . RTA (renal tubular acidosis)   . ESRD (end stage renal disease) on dialysis [facility])     "MWF; [name]" (03/05/2015)  . Paravalvular leak of prosthetic heart valve    Assessment: 74yom on coumadin pta for afib. Admit INR pending.  Home dose: 1.25mg  daily - last dose 1/27  Goal of Therapy:  INR 2-3 Monitor platelets by anticoagulation protocol: Yes   Plan:  1) STAT INR  Deboraha Sprang 05/05/2015,9:59 PM   Admit INR = 2.44  Coumadin 1.25 mg po x 1 tonight  Thank you Anette Guarneri, PharmD

## 2015-05-29 NOTE — Progress Notes (Signed)
ANTIBIOTIC CONSULT NOTE - INITIAL  Pharmacy Consult for vanc/zosyn Indication: rule out sepsis  Allergies  Allergen Reactions  . Amiodarone Other (See Comments)    Severely reduced DLCO  . Oxycodone Hcl Rash and Other (See Comments)    Rash and itching    Patient Measurements:   Adjusted Body Weight:   Vital Signs: Temp Source: Oral (01/28 1701) BP: 92/65 mmHg (01/28 1701) Intake/Output from previous day:   Intake/Output from this shift:    Labs:  Recent Labs  05/28/2015 1720 05/28/2015 1739  WBC 26.4*  --   HGB 9.2* 10.2*  PLT 248  --   CREATININE  --  6.70*   CrCl cannot be calculated (Unknown ideal weight.). No results for input(s): VANCOTROUGH, VANCOPEAK, VANCORANDOM, GENTTROUGH, GENTPEAK, GENTRANDOM, TOBRATROUGH, TOBRAPEAK, TOBRARND, AMIKACINPEAK, AMIKACINTROU, AMIKACIN in the last 72 hours.   Microbiology: No results found for this or any previous visit (from the past 720 hour(s)).  Medical History: Past Medical History  Diagnosis Date  . Atrial fibrillation, persistent ([place])     a. cardioversion 11/21/10 . b. 05/2013 - experiencing more SOB. 24-hr holter showed avg HR 49, max 82, min 38bpm. ETT showed baseline junctional bradycardia, HR incr only to 90bpm with drop in BP. Saw EP - amiodarone reduced and eventually d/c'd with improvement in dyspnea.  . Hypertension   . Warfarin anticoagulation   . Aortic valve stenosis   . Orthostatic hypotension   . Diastolic dysfunction   . Arthritis   . Self-catheterizes urinary bladder   . Junctional bradycardia   . NSVT (nonsustained ventricular tachycardia) ([place])     a. By holter 01/2014.  [name] Shortness of breath dyspnea   . PVD (peripheral vascular disease) ([place])     critical limb ischemia  . Cardiomyopathy ([place])     non-ischemic  . Hypomagnesemia   . A-V fistula ([place])   . CAD (coronary artery disease) 12/16/14    a. cath - nonobstrucive CAD 30% pro RCA; 25% dis RCA; 40T pro Lcx  . Heart murmur   . Dysrhythmia    . CHF (congestive heart failure) ([place])   . Full dentures   . Uses hearing aid   . Chronic diarrhea     takes imodium BID  . Pneumonia     hx of  . Hemodialysis patient [facility])     M,W,F - dialysis initiated August 2016  . Bruises easily   . Bladder cancer ([place])     a. 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  . 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 CKD (chronic kidney disease), stage V ([place])     a. Followed by Dr. Jimmy Footman. b. IV fistula placed 04/17/14. c. initiation of HD August 2016  . RTA (renal tubular acidosis)   . ESRD (end stage renal disease) on dialysis [facility])     "MWF; [name]" (03/05/2015)  . Paravalvular leak of prosthetic heart valve     Medications:  Scheduled:   Infusions:  . piperacillin-tazobactam 3.375 g (05/10/2015 1811)  . vancomycin 1,500 mg (05/15/2015 1810)   Assessment: 75 yo ESRD who presented with weakness of the past 2-3 days. He missed HD yesterday (MWF). He has leukocytosis with WBC at 26k. Vanc and zosyn have been ordered.  Goal of Therapy:  PreHD vanc = 15-25  Plan:   Vanc 1.5g IV x1  F/u with next HD schedule Zosyn  3.375g IV x1 then 2.25g IV q8 Level as needed  Onnie Boer, PharmD Pager: [phone] 05/02/2015 6:16 PM

## 2015-05-30 ENCOUNTER — Inpatient Hospital Stay (HOSPITAL_COMMUNITY): Payer: MEDICARE

## 2015-05-30 ENCOUNTER — Encounter (HOSPITAL_COMMUNITY): Payer: Self-pay | Admitting: Radiology

## 2015-05-30 DIAGNOSIS — Z01810 Encounter for preprocedural cardiovascular examination: Secondary | ICD-10-CM

## 2015-05-30 DIAGNOSIS — M86271 Subacute osteomyelitis, right ankle and foot: Secondary | ICD-10-CM | POA: Diagnosis present

## 2015-05-30 DIAGNOSIS — Z515 Encounter for palliative care: Secondary | ICD-10-CM

## 2015-05-30 DIAGNOSIS — I96 Gangrene, not elsewhere classified: Secondary | ICD-10-CM

## 2015-05-30 DIAGNOSIS — I428 Other cardiomyopathies: Secondary | ICD-10-CM | POA: Diagnosis present

## 2015-05-30 DIAGNOSIS — M86272 Subacute osteomyelitis, left ankle and foot: Secondary | ICD-10-CM | POA: Diagnosis present

## 2015-05-30 DIAGNOSIS — I429 Cardiomyopathy, unspecified: Secondary | ICD-10-CM

## 2015-05-30 DIAGNOSIS — I4891 Unspecified atrial fibrillation: Secondary | ICD-10-CM

## 2015-05-30 DIAGNOSIS — E871 Hypo-osmolality and hyponatremia: Secondary | ICD-10-CM | POA: Diagnosis present

## 2015-05-30 DIAGNOSIS — A480 Gas gangrene: Secondary | ICD-10-CM | POA: Diagnosis present

## 2015-05-30 LAB — BASIC METABOLIC PANEL
ANION GAP: 16 — AB (ref 5–15)
BUN: 58 mg/dL — ABNORMAL HIGH (ref 6–20)
CHLORIDE: 104 mmol/L (ref 101–111)
CO2: 17 mmol/L — AB (ref 22–32)
CREATININE: 6.74 mg/dL — AB (ref 0.61–1.24)
Calcium: 7.6 mg/dL — ABNORMAL LOW (ref 8.9–10.3)
GFR calc non Af Amer: 7 mL/min — ABNORMAL LOW (ref >60)
GFR, EST AFRICAN AMERICAN: 8 mL/min — AB (ref >60)
Glucose, Bld: 71 mg/dL (ref 65–99)
POTASSIUM: 5.1 mmol/L (ref 3.5–5.1)
Sodium: 137 mmol/L (ref 135–145)

## 2015-05-30 LAB — CBC WITH DIFFERENTIAL/PLATELET
BASOS ABS: 0 10*3/uL (ref 0.0–0.1)
Basophils Relative: 0 %
EOS ABS: 0 10*3/uL (ref 0.0–0.7)
EOS PCT: 0 %
HCT: 27.4 % — ABNORMAL LOW (ref 39.0–52.0)
Hemoglobin: 8.6 g/dL — ABNORMAL LOW (ref 13.0–17.0)
LYMPHS ABS: 1.1 10*3/uL (ref 0.7–4.0)
Lymphocytes Relative: 4 %
MCH: 35.1 pg — AB (ref 26.0–34.0)
MCHC: 31.4 g/dL (ref 30.0–36.0)
MCV: 111.8 fL — ABNORMAL HIGH (ref 78.0–100.0)
MONO ABS: 0.5 10*3/uL (ref 0.1–1.0)
Monocytes Relative: 2 %
NEUTROS PCT: 94 %
Neutro Abs: 25.6 10*3/uL — ABNORMAL HIGH (ref 1.7–7.7)
PLATELETS: 202 10*3/uL (ref 150–400)
RBC: 2.45 MIL/uL — AB (ref 4.22–5.81)
RDW: 17.6 % — AB (ref 11.5–15.5)
WBC: 27.2 10*3/uL — AB (ref 4.0–10.5)

## 2015-05-30 LAB — LACTIC ACID, PLASMA
LACTIC ACID, VENOUS: 1.7 mmol/L (ref 0.5–2.0)
Lactic Acid, Venous: 2.8 mmol/L (ref 0.5–2.0)
Lactic Acid, Venous: 1.8 mmol/L (ref 0.5–2.0)

## 2015-05-30 LAB — COMPREHENSIVE METABOLIC PANEL
ALT: 23 U/L (ref 17–63)
AST: 27 U/L (ref 15–41)
Albumin: 1.7 g/dL — ABNORMAL LOW (ref 3.5–5.0)
Alkaline Phosphatase: 117 U/L (ref 38–126)
Anion gap: 14 (ref 5–15)
BILIRUBIN TOTAL: 0.9 mg/dL (ref 0.3–1.2)
BUN: 58 mg/dL — AB (ref 6–20)
CALCIUM: 8.1 mg/dL — AB (ref 8.9–10.3)
CHLORIDE: 103 mmol/L (ref 101–111)
CO2: 21 mmol/L — ABNORMAL LOW (ref 22–32)
CREATININE: 6.98 mg/dL — AB (ref 0.61–1.24)
GFR, EST AFRICAN AMERICAN: 8 mL/min — AB (ref >60)
GFR, EST NON AFRICAN AMERICAN: 7 mL/min — AB (ref >60)
Glucose, Bld: 88 mg/dL (ref 65–99)
Potassium: 4.7 mmol/L (ref 3.5–5.1)
Sodium: 138 mmol/L (ref 135–145)
TOTAL PROTEIN: 5.5 g/dL — AB (ref 6.5–8.1)

## 2015-05-30 LAB — DIGOXIN LEVEL: Digoxin Level: 0.9 ng/mL (ref 0.8–2.0)

## 2015-05-30 LAB — PROTIME-INR
INR: 2.77 — AB (ref 0.00–1.49)
PROTHROMBIN TIME: 28.8 s — AB (ref 11.6–15.2)

## 2015-05-30 LAB — T4, FREE: FREE T4: 0.76 ng/dL (ref 0.61–1.12)

## 2015-05-30 LAB — MRSA PCR SCREENING: MRSA by PCR: NEGATIVE

## 2015-05-30 LAB — MAGNESIUM
MAGNESIUM: 1.9 mg/dL (ref 1.7–2.4)
MAGNESIUM: 1.9 mg/dL (ref 1.7–2.4)

## 2015-05-30 LAB — TSH: TSH: 6.61 u[IU]/mL — AB (ref 0.350–4.500)

## 2015-05-30 MED ORDER — CETYLPYRIDINIUM CHLORIDE 0.05 % MT LIQD
7.0000 mL | Freq: Two times a day (BID) | OROMUCOSAL | Status: DC
  Administered 2015-05-30 (×2): 7 mL via OROMUCOSAL

## 2015-05-30 MED ORDER — DIGOXIN 0.25 MG/ML IJ SOLN: 0.1250 mg | Freq: Every day | INTRAMUSCULAR | Status: DC

## 2015-05-30 MED ORDER — SODIUM CHLORIDE 0.9 % IV BOLUS (SEPSIS)
250.0000 mL | Freq: Once | INTRAVENOUS | Status: AC
  Administered 2015-05-30: 250 mL via INTRAVENOUS

## 2015-05-30 MED ORDER — SODIUM CHLORIDE 0.9 % IV SOLN
75.0000 ug/h | INTRAVENOUS | Status: DC
  Administered 2015-05-31: 75 ug/h via INTRAVENOUS
  Filled 2015-05-30 (×2): qty 50

## 2015-05-30 MED ORDER — DIGOXIN 0.25 MG/ML IJ SOLN
0.2500 mg | Freq: Once | INTRAMUSCULAR | Status: AC
  Administered 2015-05-30: 0.25 mg via INTRAVENOUS
  Filled 2015-05-30: qty 2

## 2015-05-30 MED ORDER — CHLORHEXIDINE GLUCONATE 0.12 % MT SOLN
15.0000 mL | Freq: Two times a day (BID) | OROMUCOSAL | Status: DC
  Administered 2015-05-30: 15 mL via OROMUCOSAL

## 2015-05-30 MED ORDER — VANCOMYCIN HCL 10 G IV SOLR
1500.0000 mg | Freq: Once | INTRAVENOUS | Status: AC
  Administered 2015-05-30: 1500 mg via INTRAVENOUS
  Filled 2015-05-30: qty 1500

## 2015-05-30 MED ORDER — WARFARIN 1.25 MG HALF TABLET
1.2500 mg | ORAL_TABLET | Freq: Every day | ORAL | Status: DC
  Filled 2015-05-30: qty 1

## 2015-05-30 MED ORDER — SODIUM CHLORIDE 0.9 % IV SOLN
INTRAVENOUS | Status: DC
  Administered 2015-05-30: 15:00:00 via INTRAVENOUS

## 2015-05-30 NOTE — Progress Notes (Signed)
[place] TEAM 1 - Stepdown/ICU TEAM Progress Note  [name] [id number] DOB: Oct 18, 1940 DOA: 05/11/2015 PCP: [name], MD  Admit HPI / Brief Narrative: 75 y.o. 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  Since starting dialysis 6 months ago, the patient has suffered from intra- and post-dialytic hypotension and also progressive fatigue and SOB with exertion. This did not improve with TAVR and the patient and his wife note that it just seems to get steadily worse.   Now in the last week, his weakness has progressed. Whereas he previously was able to walk short distances at home and only needed wheelchair when he left the house, this week he was so tired that he fell on Thursday, was too weak to leave the house for HD on Friday (his last HD was Weds 1/25), and then today couldn't get off the toilet.   In the ED, he was in Afib with RVR, but mentating well and appearing to oxygenate well. He was evaluated by PCCM who diagnosed elevated lactic acid from progressive gangrene and recommended stepdown admission to Medicine given the patient's stated desires for no intubation or invasive lines.  The patient denied fever, chills, cough, sputum or change in dyspnea. He had no confusion. No diarrhea, abdominal pain, vomiting. He had only worsening rest pain in both feet, worsening discharge from leg wounds, and weakness/inability to stand.    HPI/Subjective: 1/29 A/O 0 (patient heavily sedated secondary to pain). Per wife patient's leg wounds/ulcerations have been progressively worsening. States sees wound care who has been dressing them. Patient missed his Friday HD session secondary to legs becoming so painful unable  to ambulate.    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 Has received 2L (about 2/3rds) of 30 cc/kg bolus, will defer remainder per PCCM. -With regard to amputation, the patient's stated preference to me is to "do something" even if it might hasten his death, if it gave him the chance of "a few more months" with his wife. His wife is unable to care for him at home alone regardless of the outcome of this hospitalization. Will consult Cardiology re: preoperative clearance, although my suspicion is that this is not a risk anyone would be willing to take. -Spoke with [facility] Orthopedic Surgery who has agreed to evaluate patient  -Normal saline 21ml/hr -Continue current antibiotics  Afib with RVR:  -Current lead in sinus arrhythmia -Digoxin 0.125 mg daily -Digoxin level pending -Metoprolol 50 mg daily -DC warfarin -Place on Heparin drip; if patient requires surgery will need to be able to DC anticoagulation quickly -Strict in and out -Daily weight -Spoke with PA Consuelo Pandy Cardiology Request clearance for surgery from cardiology; feel unlikely given patient's MMP will be cleared  Chronic Diastolic CHF and severe AS s/p Bovine TAVR Oct 2016:/Nonischemic Cardiomyopathy -See A. fib RVR -Transfuse for hemoglobin<8  ESRD on HD MWF:  -Spoke with Dr. [name] Nephrology who will see patient. -Patient missed his Friday HD session secondary to being unable to ambulate  Anemia of renal disease:  -Stable -Trend CBC  Hyponatremia: -Resolved  Hypothyroidism -Current TSH= 6.6 -Obtain free T4  Goals of care -Wife is agreeable to speaking with palliative care and consult has been placed. She has  spoken the past with palliative care [facility] but they had not made decision on Palliative Care vs Hospice   Code Status: DO NOT RESUSCITATE Family Communication: Wife present at time of exam Disposition Plan: ??????   Consultants: Dr. [name] nephrology Dr Erlinda Hong [facility] orthopedic surgery PA Silas Flood [place] [facility]   Procedure/Significant Events: 03/23/15 echocardiogram- Left ventricle: severe concentric hypertrophy. -LVEF= 60% to 65%.  - Aortic valve: Stent valve. No obstruction or paravalvular leak. - Left atrium: Severely dilated at 60 ml/m2. - Right atrium: Massively dilated at 35 cm2. - Tricuspid valve: There was mild regurgitation. - Pulmonary arteries: PA peak pressure: 38 mm Hg (S). - Inferior vena cava: The vessel was normal in size. The respirophasic diameter changes were in the normal range (>= 50%), consistent with normal central venous pressure.m;  1/28 x-ray left foot:-Diffuse soft tissue swelling over the left foot vertically at the forefoot region. Cortical loss, focal bone destruction, and sclerosis second and third metatarsal phalangeal joints and focal bone erosion involving the proximal aspect first metatarsal bone.Suspicious osteomyelitis. 1/28 x-ray right foot;Soft tissue swelling and soft tissue gas around the first metatarsal-phalangeal joint suggesting cellulitis with gas-forming organism.  1/29 MRI left foot;Notable arthropathy at the Lisfranc joint favors erosive arthropathy or early Charcot arthropathy over infection. - Dorsal subcutaneous edema is extensive in the foot and tracks could reflect cellulitis. - Faintly reduced T1 signal in the proximal phalanx of the small toe is only suggested on the coronal T1 weighted images.  Could be an early sign of osteomyelitis   1/29 MRI right foot;-Extensive edema and speckled gas tracking along the medial ball of the foot vicinity first digit sesamoids and first digit MTP joint, a compatible with  ulceration and some tissue necrosis in this vicinity. Fluid tracking along the tendon of the abductor hallucis muscle c/w  Tenosynovitis; infectious?. Negative osteomyelitis involving the first metatarsal or proximal phalanx or sesamoids. - Dorsal subcutaneous edema in the foot cellulitis?.- Low grade edema tracks along the plantar musculature of the foot, especially the abductor hallucis muscle,myositis?. I do not see an overt drainable abscess.   Culture 1/28 blood pending 1/29 MRSA by PCR negative   Antibiotics: Zosyn 1/28>> Vancomycin 1/28>>   DVT prophylaxis: Heparin drip   Devices    LINES / TUBES:      Continuous Infusions: . sodium chloride 75 mL/hr at 05/30/15 1456    Objective: VITAL SIGNS: Temp: 97 F (36.1 C) (01/29 0800) Temp Source: Oral (01/29 0800) BP: 105/72 mmHg (01/29 1000) Pulse Rate: 42 (01/29 1000) SPO2; FIO2:   Intake/Output Summary (Last 24 hours) at 05/30/15 1513 Last data filed at 05/30/15 1005  Gross per 24 hour  Intake 102.25 ml  Output    350 ml  Net -247.75 ml     Exam: General: A/O 0, patient received pain medication secondary to extreme foot pain. No acute respiratory distress Eyes: Negative headache, negative scleral hemorrhage ENT: Negative Runny nose, negative gingival bleeding, Neck:  Negative scars, masses, torticollis, lymphadenopathy, JVD Lungs: Clear to auscultation bilaterally without wheezes or crackles Cardiovascular: Tachycardic, irregular rhythm without murmur gallop or rub normal S1 and S2 Abdomen:negative abdominal pain, nondistended, positive soft, bowel sounds, no rebound, no ascites, no appreciable mass Extremities: bilateral swelling from mid calf down. Both  calfs with erythematous, open wounds with foul-smelling discharge, multiple toes on bilateral feet gangrenous, multiple gangrenous ulcers on plantar surface, nonpalpable pulse bilateral though can obtain Doppler pulse on the right. Psychiatric: Unable  to assess secondary to altered mental status (pain medication) Neurologic: Unable to assess secondary to altered mental status (pain medication)      Data Reviewed: Basic Metabolic Panel:  Recent Labs Lab 05/21/2015 1720 05/15/2015 1739 05/04/2015 2245 05/30/15 0730 05/30/15 0940  NA 135 133*  --  137 138  K 4.6 4.2  --  5.1 4.7  CL 95* 94*  --  104 103  CO2 21*  --   --  17* 21*  GLUCOSE 97 91  --  71 88  BUN 59* 59*  --  58* 58*  CREATININE 6.90* 6.70*  --  6.74* 6.98*  CALCIUM 8.6*  --   --  7.6* 8.1*  MG  --   --  1.9  --  1.9   Liver Function Tests:  Recent Labs Lab 05/24/2015 1720 05/30/15 0940  AST 33 27  ALT 25 23  ALKPHOS 118 117  BILITOT 0.7 0.9  PROT 5.9* 5.5*  ALBUMIN 1.9* 1.7*   No results for input(s): LIPASE, AMYLASE in the last 168 hours. No results for input(s): AMMONIA in the last 168 hours. CBC:  Recent Labs Lab 05/28/2015 1720 05/12/2015 1739 05/30/15 0940  WBC 26.4*  --  27.2*  NEUTROABS 23.5*  --  25.6*  HGB 9.2* 10.2* 8.6*  HCT 29.6* 30.0* 27.4*  MCV 112.1*  --  111.8*  PLT 248  --  202   Cardiac Enzymes: No results for input(s): CKTOTAL, CKMB, CKMBINDEX, TROPONINI in the last 168 hours. BNP (last 3 results)  Recent Labs  06/09/14 1641 12/10/14 2341  BNP 2524.7* 3973.1*    ProBNP (last 3 results)  Recent Labs  12/09/14 1522  PROBNP 3135.0*    CBG: No results for input(s): GLUCAP in the last 168 hours.  Recent Results (from the past 240 hour(s))  Culture, blood (routine x 2)     Status: None (Preliminary result)   Collection Time: 05/22/2015  6:39 PM  Result Value Ref Range Status   Specimen Description BLOOD RIGHT HAND  Final   Special Requests IN PEDIATRIC BOTTLE 3CC  Final   Culture PENDING  Incomplete   Report Status PENDING  Incomplete  MRSA PCR Screening     Status: None   Collection Time: 05/30/15 12:42 AM  Result Value Ref Range Status   MRSA by PCR NEGATIVE NEGATIVE Final    Comment:        The GeneXpert MRSA  Assay (FDA approved for NASAL specimens only), is one component of a comprehensive MRSA colonization surveillance program. It is not intended to diagnose MRSA infection nor to guide or monitor treatment for MRSA infections.      Studies:  Recent x-ray studies have been reviewed in detail by the Attending Physician  Scheduled Meds:  Scheduled Meds: . acidophilus  1 capsule Oral Daily  . antiseptic oral rinse  7 mL Mouth Rinse q12n4p  . aspirin EC  81 mg Oral QHS  . chlorhexidine  15 mL Mouth Rinse BID  . [START ON 05/31/2015] digoxin  0.125 mg Intravenous Daily  . gabapentin  300 mg Oral BID  . metoprolol succinate  50 mg Oral QHS  . piperacillin-tazobactam (ZOSYN)  IV  2.25 g Intravenous Q8H  . senna  1 tablet Oral BID  . sevelamer carbonate  1,600 mg Oral TID WC  . sodium chloride flush  3 mL Intravenous Q12H    Time spent on care of this patient: 40 mins   Archie Shea, Geraldo Docker , MD  [facility] Office  [phone] Pager - [phone]  On-Call/Text Page:      [email or web address]      password [email or web address]  If 7PM-7AM, please contact night-coverage [email or web address] Password [email or web address] 05/30/2015, 3:13 PM   LOS: 1 day   Care during the described time interval was provided by me .  I have reviewed this patient's available data, including medical history, events of note, physical examination, and all test results as part of my evaluation. I have personally reviewed and interpreted all radiology studies.   Dia Crawford, MD [phone] Pager

## 2015-05-30 NOTE — Progress Notes (Signed)
Spoke with [name] and appt set for 0900 05/31/15 for [place] discussion. Thank you,  Romona Curls, ANP

## 2015-05-30 NOTE — Progress Notes (Signed)
PT Cancellation Note and D/C note  Patient Details Name: HARBOR BARRERAS MRN: [id number] DOB: Sep 23, 1940   Cancelled Treatment:    Reason Eval/Treat Not Completed: Other (comment) (Family states pt end of life.  They request no therapy.)Will sign off.  Thanks.   Irwin Brakeman F 05/30/2015, 2:50 PM [facility] [facility] [phone] (pager)

## 2015-05-30 NOTE — Progress Notes (Addendum)
ANTICOAGULATION CONSULT NOTE - Follow Up Consult  Pharmacy Consult for Coumadin Indication: atrial fibrillation  Allergies  Allergen Reactions  . Amiodarone Other (See Comments)    Severely reduced DLCO  . Oxycodone Hcl Rash and Other (See Comments)    Rash and itching    Vital Signs: Temp: 97 F (36.1 C) (01/29 0800) Temp Source: Oral (01/29 0800) BP: 107/77 mmHg (01/29 0800) Pulse Rate: 81 (01/29 0434)  Labs:  Recent Labs  05/19/2015 1720 05/31/2015 1739 05/05/2015 2245 05/30/15 0730  HGB 9.2* 10.2*  --   --   HCT 29.6* 30.0*  --   --   PLT 248  --   --   --   LABPROT  --   --  26.2*  --   INR  --   --  2.44*  --   CREATININE 6.90* 6.70*  --  6.74*    Estimated Creatinine Clearance: 9.8 mL/min (by C-G formula based on Cr of 6.74).   Medical History: Past Medical History  Diagnosis Date  . Atrial fibrillation, persistent ([place])     a. cardioversion 11/21/10 . b. 05/2013 - experiencing more SOB. 24-hr holter showed avg HR 49, max 82, min 38bpm. ETT showed baseline junctional bradycardia, HR incr only to 90bpm with drop in BP. Saw EP - amiodarone reduced and eventually d/c'd with improvement in dyspnea.  . Hypertension   . Warfarin anticoagulation   . Aortic valve stenosis   . Orthostatic hypotension   . Diastolic dysfunction   . Arthritis   . Self-catheterizes urinary bladder   . Junctional bradycardia   . NSVT (nonsustained ventricular tachycardia) ([place])     a. By holter 01/2014.  [name] Shortness of breath dyspnea   . PVD (peripheral vascular disease) ([place])     critical limb ischemia  . Cardiomyopathy ([place])     non-ischemic  . Hypomagnesemia   . A-V fistula ([place])   . CAD (coronary artery disease) 12/16/14    a. cath - nonobstrucive CAD 30% pro RCA; 25% dis RCA; 40T pro Lcx  . Heart murmur   . Dysrhythmia   . CHF (congestive heart failure) ([place])   . Full dentures   . Uses hearing aid   . Chronic diarrhea     takes imodium BID  . Pneumonia     hx of  .  Hemodialysis patient [facility])     M,W,F - dialysis initiated August 2016  . Bruises easily   . Bladder cancer ([place])     a. 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  . 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 CKD (chronic kidney disease), stage V ([place])     a. Followed by Dr. Jimmy Footman. b. IV fistula placed 04/17/14. c. initiation of HD August 2016  . RTA (renal tubular acidosis)   . ESRD (end stage renal disease) on dialysis [facility])     "MWF; [name]" (03/05/2015)  . Paravalvular leak of prosthetic heart valve    Assessment: 74yom on coumadin pta for afib. Admit INR therapeutic at 2.44. H/H low stable, Plt wnl   Home dose: 1.25mg  daily - last dose 1/27  Goal of Therapy:  INR 2-3 Monitor platelets by anticoagulation protocol: Yes   Plan:  -Coumadin 1.25 mg daily -Monitor daily INR   Albertina Parr, PharmD., BCPS Clinical Pharmacist Phone [phone]   Addendum: Now switching from oral anticoagulation to  IV heparin. INR remains therapeutic at 2.77. Will order daily INR and start IV heparin when INR < 2.   Albertina Parr, PharmD., BCPS Clinical Pharmacist Phone [phone]

## 2015-05-30 NOTE — Consult Note (Signed)
Renal Service Consult Note [facility]  [name] 05/30/2015 [name] D Requesting Physician:  Dr [name]  Reason for Consult:  ESRD pt with gangrene HPI: The patient is a 75 y.o. year-old with hx of afib, ESRD, DM, AS sp TAVRS procedure and hx bladder cancer. He presented to ED last night with gen weakness for 2-3 day, missed HD yesterday.  [place] [facility] at home.  Wounds on both legs getting worse, over last 4 months. Bad odor from leg wounds WBC 26k,  Hb 10.2, plts 248. Started on vanc/ zosyn IV.   Patient is obtunded and does not provide any history. Wife says he got sick w/o having a temperature or fever.  They have talked w Dr [name] today and are planning on transitioning to comfort care, have pall care meeting this afternoon planned.    Chart review: 2007 - CBD stones/ pancreatitis > lap chole, ERCP x 2 w stent. Also afib/ RVR, AKI, bladder ca hx XRT and chemo/ cystectomy / urostomy.  2008 - recur afib, HCOM/ HTN/ hx bladder CA/ hx DCCV for afib > sotalol loading 2008 - recur afib, sotalol loading 2008 - recur afib, amio loading Dec 2015 - SOB, afib /RVR, CKD IV > rx with diltiazem. CKD V not on dialysis yet, AVF placed 12/8. Diuresed.  Jan 2016 - SOB/ afib w RVR > improved w lasix IV, cont BB and dilt for afib, coumadin.  Feb 2016 - SOB, underlying COPD/ diast CHF > diuresis, mod AS. HTN on dilt/ BB. CKD creat 4.5, po lasxi 40 bid. Afib Aug 2016 - a/c CHF/ vol overload > diuresed. Seen for possible TAVRS.  Oct 2016 - TAVRS procedure, trans-apical for severe AS. Hemodialysis.  Nov 2016 - AFib w RVR > IV then po dilt, coumadin. HD for ESRD. HCOM.     ROS    Past Medical History  Past Medical History  Diagnosis Date  . Atrial fibrillation, persistent ([place])     a. cardioversion 11/21/10 . b. 05/2013 - experiencing more SOB. 24-hr holter showed avg HR 49, max 82, min 38bpm. ETT showed baseline junctional bradycardia, HR incr only to 90bpm with drop in BP.  Saw EP - amiodarone reduced and eventually d/c'd with improvement in dyspnea.  . Hypertension   . Warfarin anticoagulation   . Aortic valve stenosis   . Orthostatic hypotension   . Diastolic dysfunction   . Arthritis   . Self-catheterizes urinary bladder   . Junctional bradycardia   . NSVT (nonsustained ventricular tachycardia) ([name])     a. By holter 01/2014.  [name] Shortness of breath dyspnea   . PVD (peripheral vascular disease) ([name])     critical limb ischemia  . Cardiomyopathy ([name])     non-ischemic  . Hypomagnesemia   . A-V fistula ([name])   . CAD (coronary artery disease) 12/16/14    a. cath - nonobstrucive CAD 30% pro RCA; 25% dis RCA; 40T pro Lcx  . Heart murmur   . Dysrhythmia   . CHF (congestive heart failure) ([place])   . Full dentures   . Uses hearing aid   . Chronic diarrhea     takes imodium BID  . Pneumonia     hx of  . Hemodialysis patient [facility])     M,W,F - dialysis initiated August 2016  . Bruises easily   . Bladder cancer ([place])     a. 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  . 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 CKD (chronic kidney disease), stage V ([place])     a. Followed by Dr. Jimmy Footman. b. IV fistula placed 04/17/14. c. initiation of HD August 2016  . RTA (renal tubular acidosis)   . ESRD (end stage renal disease) on dialysis [facility])     "MWF; [name]" (03/05/2015)  . Paravalvular leak of prosthetic heart valve    Past Surgical History  Past Surgical History  Procedure Laterality Date  . Cardioversion  11/21/2010  . Cystectomy  2002  . Cholecystectomy    . Cataract extraction w/ intraocular lens  implant, bilateral    . Colonoscopy    . Av fistula placement Left 04/17/2014    Procedure: ARTERIOVENOUS (AV) FISTULA CREATION- LEFT UPPER ARM ;  Surgeon: Mal [name], MD;  Location: [place];  Service: Vascular;  Laterality:  Left;  . Bladder surgery  2002    radical cystectomy  . Vascular surgery    . Multiple tooth extractions    . Cardiac catheterization N/A 12/16/2014    Procedure: Right/Left Heart Cath and Coronary Angiography;  Surgeon: [name], MD;  Location: [place] [facility];  Service: Cardiovascular;  Laterality: N/A;  . Eye surgery    . Colon surgery  Nov 2002    reconstructed bladder from colon. [facility] pt. at [facility]  . Tee without cardioversion N/A 01/01/2015    Procedure: TRANSESOPHAGEAL ECHOCARDIOGRAM (TEE);  Surgeon: [name], MD;  [name];  Service: Cardiovascular;  Laterality: N/A;  . Transcatheter aortic valve replacement, transapical N/A 02/16/2015    Procedure: TRANSCATHETER AORTIC VALVE REPLACEMENT, TRANSAPICAL;  Surgeon: [name], MD;  Location: [place];  Service: Open Heart Surgery;  Laterality: N/A;  . Tee without cardioversion N/A 02/16/2015    Procedure: TRANSESOPHAGEAL ECHOCARDIOGRAM (TEE);  Surgeon: [name], MD;  Location: [place];  Service: Open Heart Surgery;  Laterality: N/A;  . Tavr -valve researched and ok fpr 1.5t or 3t mri  researched on 05/30/2015    [name] XT transcatheter heart valve (size 41mm, model # 9300TFX, serial [id number]   Family History  Family History  Problem Relation Age of Onset  . CVA Mother   . Heart attack Father   . Heart disease Father   . Arrhythmia Father   . Alzheimer's disease Sister   . Multiple sclerosis Brother   . Alcohol abuse Father    Social History  reports that he quit smoking about 32 years ago. His smoking use included Cigarettes. He has a 66 pack-year smoking history. He has never used smokeless tobacco. He reports that he drinks alcohol. He reports that he does not use illicit drugs. Allergies  Allergies  Allergen Reactions  . Amiodarone Other (See Comments)    Severely reduced DLCO  . Oxycodone Hcl Rash and Other (See Comments)    Rash and itching   Home medications Prior to Admission  medications   Medication Sig Start Date End Date Taking? Authorizing Provider  acetaminophen (TYLENOL) 500 MG tablet Take 1,000 mg by mouth every 6 (six) hours as needed for mild pain or moderate pain.   Yes Historical Provider, MD  aspirin EC 81 MG tablet Take 81 mg by mouth at bedtime.   Yes Historical Provider, MD  Loperamide-Simethicone (IMODIUM MULTI-SYMPTOM RELIEF PO) Take 1 tablet by mouth 2 (two) times daily.   Yes Historical Provider, MD  metoprolol succinate (TOPROL-XL) 50 MG 24  hr tablet Take 50 mg by mouth at bedtime. Take with or immediately following a meal.   Yes Historical Provider, MD  multivitamin (RENA-VIT) TABS tablet Take 1 tablet by mouth daily.   Yes Historical Provider, MD  Povidone-Iodine (BETADINE EX) Apply 1 application topically every other day. Apply to hard-crusted wounds   Yes Historical Provider, MD  Probiotic Product (ALIGN PO) Take 1 tablet by mouth daily.   Yes Historical Provider, MD  sevelamer carbonate (RENVELA) 800 MG tablet Take 1,600 mg by mouth 3 (three) times daily with meals.    Yes Historical Provider, MD  Silver-Carboxymethylcellulose (AQUACEL-AG EXTRA HYDROFIBER EX) Apply 3 patches topically every other day. Apply to weeping wounds   Yes Historical Provider, MD  traMADol (ULTRAM) 50 MG tablet Take 50 mg by mouth every 6 (six) hours as needed (pain).   Yes Historical Provider, MD  warfarin (COUMADIN) 2.5 MG tablet Take as directed by coumadin clinic Patient taking differently: Take 1.25 mg by mouth at bedtime. Take as directed by coumadin clinic 04/30/15  Yes Sueanne Margarita, MD  diltiazem (DILTIAZEM CD) 180 MG 24 hr capsule Take 1 capsule (180 mg total) by mouth daily. Patient not taking: Reported on 05/16/2015 03/24/15   Will Meredith Leeds, MD  metoprolol tartrate (LOPRESSOR) 25 MG tablet Take 2 tablets (50 mg total) by mouth every morning.  Take 1 tablet (25 mg total) by mouth every evening. Patient not taking: Reported on 05/21/2015 03/24/15   Will  Meredith Leeds, MD   Liver Function Tests  Recent Labs Lab 05/26/2015 1720 05/30/15 0940  AST 33 27  ALT 25 23  ALKPHOS 118 117  BILITOT 0.7 0.9  PROT 5.9* 5.5*  ALBUMIN 1.9* 1.7*   No results for input(s): LIPASE, AMYLASE in the last 168 hours. CBC  Recent Labs Lab 05/02/2015 1720 05/28/2015 1739 05/30/15 0940  WBC 26.4*  --  27.2*  NEUTROABS 23.5*  --  25.6*  HGB 9.2* 10.2* 8.6*  HCT 29.6* 30.0* 27.4*  MCV 112.1*  --  111.8*  PLT 248  --  [id number]   Basic Metabolic Panel  Recent Labs Lab 05/17/2015 1720 05/24/2015 1739 05/30/15 0730 05/30/15 0940  NA 135 133* 137 138  K 4.6 4.2 5.1 4.7  CL 95* 94* 104 103  CO2 21*  --  17* 21*  GLUCOSE 97 91 71 88  BUN 59* 59* 58* 58*  CREATININE 6.90* 6.70* 6.74* 6.98*  CALCIUM 8.6*  --  7.6* 8.1*    Filed Vitals:   05/30/15 0500 05/30/15 0515 05/30/15 0800 05/30/15 1000  BP: 95/61 104/61 107/77 105/72  Pulse:    42  Temp:   97 F (36.1 C)   TempSrc:   Oral   Resp: 15 13 16 17   Height:      Weight:      SpO2:    83%   Exam Obtunded No rash, cyanosis or gangrene Sclera anicteric, throat clear No jvd Chest clear ant and lat RRR soft SEM no RG Abd soft nondistended dec'd BS GU normal male MS no joint effusion Ext 1-2+ pretib edema bilat Marked gangrenous changes R toes/ foot and L toes/ heel Strong odor of necrotic tissue Neuro is not responding   Dialysis: 4h  400/800  68kg  3/2 bath  Hep 4000  LUA AVF Bp's soft SBP 100- 110 usually, on MTP 50/ d only for afib most likely 9.0/ 21%/ 621 >> Mircera 75 ug q 2wks last 1/25 9.9/ 5.0/ 160 >> renvela  800 take 2 ac Compliance: good  Assessment: 1 Bilat foot infection/ wet gangrene - pt's wife says the are planning transition to comfort care 2 ESRD usual HD MWF.  Not likely to be able to tolerate HD in this condition 3 Afib 4 AS sp TAVRS 5 FTT 6 Hx bladder Ca s/p cystect/urostomy   Plan - comfort care, no further dialysis.  Renal svc will sign off.   Kelly Splinter  MD [employer] pager [phone]    cell [phone] 05/30/2015, 1:58 PM

## 2015-05-30 NOTE — Progress Notes (Signed)
OT Cancellation Note/Discharge  Patient Details Name: [name] MRN: [id number] DOB: 07/12/1940   Cancelled Treatment:    Reason Eval/Treat Not Completed: Other (comment) Per PT, family does not want therapy. Will sign off.   Burr, OTR/L  [id number] 05/30/2015 05/30/2015, 10:44 PM

## 2015-05-30 NOTE — Consult Note (Signed)
ORTHOPAEDIC CONSULTATION  REQUESTING PHYSICIAN: Allie Bossier, MD  Chief Complaint: Bilateral foot gangrene and ulcers  HPI: [name] is a 75 y.o. male who presents with progressively worsening ulcers and dry gangrene since November.  He has been to many wound centers for treatment.  He presented last night with sepsis.  He has multiple medical problems including ESRD, Afib on coumadin, CHF.  HPI obtained from wife as patient is sedated.  The gangrene does cause him pain.  Ortho consulted.  Past Medical History  Diagnosis Date  . Atrial fibrillation, persistent ([place])     a. cardioversion 11/21/10 . b. 05/2013 - experiencing more SOB. 24-hr holter showed avg HR 49, max 82, min 38bpm. ETT showed baseline junctional bradycardia, HR incr only to 90bpm with drop in BP. Saw EP - amiodarone reduced and eventually d/c'd with improvement in dyspnea.  . Hypertension   . Warfarin anticoagulation   . Aortic valve stenosis   . Orthostatic hypotension   . Diastolic dysfunction   . Arthritis   . Self-catheterizes urinary bladder   . Junctional bradycardia   . NSVT (nonsustained ventricular tachycardia) ([place])     a. By holter 01/2014.  [name] Shortness of breath dyspnea   . PVD (peripheral vascular disease) ([place])     critical limb ischemia  . Cardiomyopathy ([place])     non-ischemic  . Hypomagnesemia   . A-V fistula ([place])   . CAD (coronary artery disease) 12/16/14    a. cath - nonobstrucive CAD 30% pro RCA; 25% dis RCA; 40T pro Lcx  . Heart murmur   . Dysrhythmia   . CHF (congestive heart failure) ([place])   . Full dentures   . Uses hearing aid   . Chronic diarrhea     takes imodium BID  . Pneumonia     hx of  . Hemodialysis patient [facility])     M,W,F - dialysis initiated August 2016  . Bruises easily   . Bladder cancer ([place])     a. 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  . 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 CKD (chronic kidney disease), stage V ([place])     a. Followed by Dr. Jimmy Footman. b. IV fistula placed 04/17/14. c. initiation of HD August 2016  . RTA (renal tubular acidosis)   . ESRD (end stage renal disease) on dialysis [facility])     "MWF; [name]" (03/05/2015)  . Paravalvular leak of prosthetic heart valve    Past Surgical History  Procedure Laterality Date  . Cardioversion  11/21/2010  . Cystectomy  2002  . Cholecystectomy    . Cataract extraction w/ intraocular lens  implant, bilateral    . Colonoscopy    . Av fistula placement Left 04/17/2014    Procedure: ARTERIOVENOUS (AV) FISTULA CREATION- LEFT UPPER ARM ;  Surgeon: Mal [name], MD;  Location: [place];  Service: Vascular;  Laterality: Left;  . Bladder surgery  2002    radical cystectomy  . Vascular surgery    . Multiple tooth extractions    . Cardiac catheterization N/A 12/16/2014    Procedure: Right/Left Heart Cath and Coronary Angiography;  Surgeon: [name], MD;  Location: [place] CV LAB;  Service: Cardiovascular;  Laterality: N/A;  . Eye surgery    . Colon surgery  Nov 2002    reconstructed bladder from colon. per pt. at  [facility]  Darden Dates without cardioversion N/A 01/01/2015    Procedure: TRANSESOPHAGEAL ECHOCARDIOGRAM (TEE);  Surgeon: [name], MD;  Location[facility] ENDOSCOPY;  Service: Cardiovascular;  Laterality: N/A;  . Transcatheter aortic valve replacement, transapical N/A 02/16/2015    Procedure: TRANSCATHETER AORTIC VALVE REPLACEMENT, TRANSAPICAL;  Surgeon: [name], MD;  Location: [place];  Service: Open Heart Surgery;  Laterality: N/A;  . Tee without cardioversion N/A 02/16/2015    Procedure: TRANSESOPHAGEAL ECHOCARDIOGRAM (TEE);  Surgeon: [name], MD;  Location: [place];  Service: Open Heart Surgery;  Laterality: N/A;  . Tavr -valve researched and ok fpr 1.5t or 3t mri  researched on 05/30/2015    [name] XT transcatheter heart  valve (size 49mm, model # 9300TFX, serial [id number]   Social History   Social History  . Marital Status: Married    Spouse Name: N/A  . Number of Children: N/A  . Years of Education: N/A   Social History Main Topics  . Smoking status: Former Smoker -- 2.00 packs/day for 33 years    Types: Cigarettes    Quit date: 05/02/1983  . Smokeless tobacco: Never Used  . Alcohol Use: Yes     Comment: rare  . Drug Use: No  . Sexual Activity: Not Asked   Other Topics Concern  . None   Social History Narrative   Family History  Problem Relation Age of Onset  . CVA Mother   . Heart attack Father   . Heart disease Father   . Arrhythmia Father   . Alzheimer's disease Sister   . Multiple sclerosis Brother   . Alcohol abuse Father    - negative except otherwise stated in the family history section Allergies  Allergen Reactions  . Amiodarone Other (See Comments)    Severely reduced DLCO  . Oxycodone Hcl Rash and Other (See Comments)    Rash and itching   Prior to Admission medications   Medication Sig Start Date End Date Taking? Authorizing Provider  acetaminophen (TYLENOL) 500 MG tablet Take 1,000 mg by mouth every 6 (six) hours as needed for mild pain or moderate pain.   Yes Historical Provider, MD  aspirin EC 81 MG tablet Take 81 mg by mouth at bedtime.   Yes Historical Provider, MD  Loperamide-Simethicone (IMODIUM MULTI-SYMPTOM RELIEF PO) Take 1 tablet by mouth 2 (two) times daily.   Yes Historical Provider, MD  metoprolol succinate (TOPROL-XL) 50 MG 24 hr tablet Take 50 mg by mouth at bedtime. Take with or immediately following a meal.   Yes Historical Provider, MD  multivitamin (RENA-VIT) TABS tablet Take 1 tablet by mouth daily.   Yes Historical Provider, MD  Povidone-Iodine (BETADINE EX) Apply 1 application topically every other day. Apply to hard-crusted wounds   Yes Historical Provider, MD  Probiotic Product (ALIGN PO) Take 1 tablet by mouth daily.   Yes Historical Provider, MD   sevelamer carbonate (RENVELA) 800 MG tablet Take 1,600 mg by mouth 3 (three) times daily with meals.    Yes Historical Provider, MD  Silver-Carboxymethylcellulose (AQUACEL-AG EXTRA HYDROFIBER EX) Apply 3 patches topically every other day. Apply to weeping wounds   Yes Historical Provider, MD  traMADol (ULTRAM) 50 MG tablet Take 50 mg by mouth every 6 (six) hours as needed (pain).   Yes Historical Provider, MD  warfarin (COUMADIN) 2.5 MG tablet Take as directed by coumadin clinic Patient taking differently: Take 1.25 mg by mouth at bedtime. Take as directed by coumadin clinic 04/30/15  Yes  Sueanne Margarita, MD  diltiazem (DILTIAZEM CD) 180 MG 24 hr capsule Take 1 capsule (180 mg total) by mouth daily. Patient not taking: Reported on 05/12/2015 03/24/15   Will Meredith Leeds, MD  metoprolol tartrate (LOPRESSOR) 25 MG tablet Take 2 tablets (50 mg total) by mouth every morning.  Take 1 tablet (25 mg total) by mouth every evening. Patient not taking: Reported on 05/08/2015 03/24/15   Will Meredith Leeds, MD   Ct Head Wo Contrast  05/30/2015  CLINICAL DATA:  Fall, head and face injury, facial abrasions and bruising. Weakness and confusion. EXAM: CT HEAD WITHOUT CONTRAST CT MAXILLOFACIAL WITHOUT CONTRAST TECHNIQUE: Multidetector CT imaging of the head and maxillofacial structures were performed using the standard protocol without intravenous contrast. Multiplanar CT image reconstructions of the maxillofacial structures were also generated. COMPARISON:  04/02/2015 FINDINGS: CT HEAD FINDINGS Stable brain atrophy and chronic white matter microvascular ischemic changes throughout the periventricular white matter. No acute intracranial hemorrhage, mass lesion, definite infarction, midline shift, herniation, or hydrocephalus. No extra-axial fluid collection. No focal mass effect or edema. Cisterns are patent. Cerebellar atrophy as well. CT MAXILLOFACIAL FINDINGS Exam is limited with motion artifact despite repeating  portions of the study. Small minimally depressed nasal bone fractures suspected, more so on the right. Mandible, maxilla, pterygoid plates, zygomas, skullbase, nasal septum, and orbits appear intact. Sinuses remain clear. Mastoids are clear as well. No orbital abnormality or proptosis. No orbital blowout fracture. IMPRESSION: Stable atrophy and chronic white matter microvascular ischemic changes. No interval change or acute process by noncontrast CT. Nasal bone fractures, minimally depressed on the right. No other acute facial bony trauma within the limits of the study. Electronically Signed   By: Jerilynn Mages.  Shick M.D.   On: 05/30/2015 13:16   Mr Foot Right Wo Contrast  05/30/2015  CLINICAL DATA:  Discoloration of the toes with foul odor, query osteomyelitis. EXAM: MRI OF THE RIGHT FOREFOOT WITHOUT CONTRAST TECHNIQUE: Multiplanar, multisequence MR imaging was performed. No intravenous contrast was administered. COMPARISON:  05/08/2015 FINDINGS: Despite efforts by the technologist and patient, motion artifact is present on today's exam and could not be eliminated. This reduces exam sensitivity and specificity. Plantar and medial to the great toe there is abnormal subcutaneous edema signal and small locules of gas indicating ulceration and potentially tissue necrosis. This is especially just plantar to the sesamoids. The soft tissues overlying the tufts of the great toe and second toe appear thin, possibly from prior resections. There is questionable edema signal in the tuft of the great toe, for example with poor definition of the normal high T1 signal on images 12-13 of series 6 and a suggestion of possible high-signal in the tuft on image 16 of series 10. There is mild abnormal edema tracking along the dorsum of the foot and along the plantar musculature of the foot. The edema is most confluent along the medial ball of the foot. The muscular edema is most confluent within the muscle and around the tendon of the  adductor hallucis. IMPRESSION: 1. Extensive edema and speckled gas tracking along the medial ball of the foot in the vicinity of the first digit sesamoids and first digit MTP joint, as shown on conventional radiography, compatible with ulceration and some tissue necrosis in this vicinity. There is some fluid tracking along the tendon of the abductor hallucis muscle compatible with tenosynovitis which could be infectious. However, we do not observe osteomyelitis involving the first metatarsal or proximal phalanx or sesamoids. 2. Suspected edema in the  tuft of the distal phalanx of the great toe. This is fairly minimal and could simply be reactive rather than necessarily indicating osteomyelitis. There is a suggestion of mild thinning of the soft tissues along the distal margins of the great toe and second toe, query prior resections. 3. Dorsal subcutaneous edema in the foot potentially reflecting cellulitis. Low grade edema tracks along the plantar musculature of the foot, especially the abductor hallucis muscle, and may reflect myositis. I do not see an overt drainable abscess. Electronically Signed   By: Van Clines M.D.   On: 05/30/2015 12:36   Mr Foot Left Wo Contrast  05/30/2015  CLINICAL DATA:  Osteomyelitis EXAM: MRI OF THE LEFT FOREFOOT WITHOUT CONTRAST TECHNIQUE: Multiplanar, multisequence MR imaging was performed. No intravenous contrast was administered. COMPARISON:  05/14/2015 FINDINGS: Despite efforts by the technologist and patient, motion artifact is present on today's exam and could not be eliminated. This reduces exam sensitivity and specificity. There is severe arthropathy at the Lisfranc joint with spurring, subcortical cystic lesions, and erosions. This is most striking distally in the middle cuneiform or there is extensive low T1 and high T2 signal and poor definition of cortical margins, and conceivably some bony fragmentation. Low-level osseous edema noted in the bases of the  metatarsals, most notably the third, and there is intermetatarsal arthropathy as well. There is no malalignment at the Lisfranc joint but the Lisfranc ligament itself is highly indistinct. The findings at the Lisfranc joint are more in keeping of erosive arthropathy or early Charcot arthropathy rather than osteomyelitis. Despite the lack of clear visualization of the Lisfranc ligament, the degree of arthropathy without subluxation makes this less likely to be due to Lisfranc joint instability. On T1 weighted images there is some faint low signal in the shaft of the proximal phalanx small toe. This digit is difficult Celsius on other imaging sequences due to motion. There is extensive subcutaneous edema tracking along the dorsum of the foot. This tracks into the toes. No drainable abscess observed. IMPRESSION: 1. Notable arthropathy at the Lisfranc joint favors erosive arthropathy or early Charcot arthropathy over infection. The Lisfranc ligament is not well seen, but there is no current malalignment at the Lisfranc joint to suggest that this is necessarily the sequela of Lisfranc joint instability. 2. Dorsal subcutaneous edema is extensive in the foot and tracks into the toes, and could reflect cellulitis. 3. Faintly reduced T1 signal in the proximal phalanx of the small toe is only suggested on the coronal T1 weighted images. Although this could be an early sign of osteomyelitis the lack of correlating findings on other sequences is not further supportive. Correlate with any particular confluence of findings involving the small toe clinically such as swelling, erythema, or ulceration. Electronically Signed   By: Van Clines M.D.   On: 05/30/2015 12:21   Dg Chest Portable 1 View  05/09/2015  CLINICAL DATA:  75 year old with end-stage renal disease on hemodialysis, patient missed dialysis yesterday, presenting with 2-3 day history of generalized weakness. Hypotension and tachycardia upon presentation. EXAM:  PORTABLE CHEST 1 VIEW COMPARISON:  03/23/2015 and earlier. FINDINGS: Cardiac silhouette moderately enlarged for AP portable technique, unchanged. Prior TAVR procedure. Dense mitral annular calcification is noted previously. Prominent paracardiac fat pad on the left. Lungs clear. Bronchovascular markings normal. Pulmonary vascularity normal. No visible pleural effusions. No pneumothorax. IMPRESSION: Stable cardiomegaly.  No acute cardiopulmonary disease. Electronically Signed   By: Evangeline Dakin M.D.   On: 05/17/2015 17:41   Dg Foot Complete  Left  05/28/2015  CLINICAL DATA:  Severe weakness in both legs for 3 days. Pain and swelling mostly in the left foot. Bilateral leg pain. EXAM: LEFT FOOT - COMPLETE 3+ VIEW COMPARISON:  None. FINDINGS: Examination is limited due patient positioning. Diffuse bone demineralization. No acute fracture or dislocation. There is soft tissue swelling over the dorsum of the left foot and ankle and over the plantar aspect of the forefoot. No soft tissue gas collections or radiopaque foreign bodies are identified. There is evidence of underlying bone erosion with cortical loss and bone sclerosis involving the second and third tarsometatarsal joints with focal cortical loss at the dorsal aspect of the middle cuneiform. There is also focal erosion demonstrated in the base of the first metatarsal bone. These changes are suspicious for osteomyelitis. Diffuse vascular calcification consistent with sides. IMPRESSION: Diffuse soft tissue swelling over the left foot vertically at the forefoot region. Cortical loss, focal bone destruction, and sclerosis involving the second and third metatarsal phalangeal joints and focal bone erosion involving the proximal aspect of the first metatarsal bone. Appearance is suspicious for osteomyelitis. Electronically Signed   By: Lucienne Capers M.D.   On: 05/14/2015 20:55   Dg Foot Complete Right  05/13/2015  CLINICAL DATA:  Severe weakness in both legs  for 3 days. More pain in the left foot. Left foot is swollen. Bilateral leg pain. EXAM: RIGHT FOOT COMPLETE - 3+ VIEW COMPARISON:  None. FINDINGS: Diffuse bone demineralization. No evidence of acute fracture or dislocation. Soft tissue swelling and soft tissue gas adjacent to the first metatarsal-phalangeal joint. This suggest infection/ cellulitis due to gas-forming organism. Underlying bones appear intact. There is no evidence of bone loss or cortical destruction or sclerosis that would suggest evidence of osteomyelitis. MRI would be more sensitive if clinically indicated. Plantar calcaneal spur. Extensive vascular calcifications consistent with diabetes. IMPRESSION: Soft tissue swelling and soft tissue gas around the first metatarsal-phalangeal joint suggesting cellulitis with gas-forming organism. No bone changes that would suggest osteomyelitis. Electronically Signed   By: Lucienne Capers M.D.   On: 05/26/2015 20:52   Ct Maxillofacial Wo Cm  05/30/2015  CLINICAL DATA:  Fall, head and face injury, facial abrasions and bruising. Weakness and confusion. EXAM: CT HEAD WITHOUT CONTRAST CT MAXILLOFACIAL WITHOUT CONTRAST TECHNIQUE: Multidetector CT imaging of the head and maxillofacial structures were performed using the standard protocol without intravenous contrast. Multiplanar CT image reconstructions of the maxillofacial structures were also generated. COMPARISON:  04/02/2015 FINDINGS: CT HEAD FINDINGS Stable brain atrophy and chronic white matter microvascular ischemic changes throughout the periventricular white matter. No acute intracranial hemorrhage, mass lesion, definite infarction, midline shift, herniation, or hydrocephalus. No extra-axial fluid collection. No focal mass effect or edema. Cisterns are patent. Cerebellar atrophy as well. CT MAXILLOFACIAL FINDINGS Exam is limited with motion artifact despite repeating portions of the study. Small minimally depressed nasal bone fractures suspected, more so  on the right. Mandible, maxilla, pterygoid plates, zygomas, skullbase, nasal septum, and orbits appear intact. Sinuses remain clear. Mastoids are clear as well. No orbital abnormality or proptosis. No orbital blowout fracture. IMPRESSION: Stable atrophy and chronic white matter microvascular ischemic changes. No interval change or acute process by noncontrast CT. Nasal bone fractures, minimally depressed on the right. No other acute facial bony trauma within the limits of the study. Electronically Signed   By: Jerilynn Mages.  Shick M.D.   On: 05/30/2015 13:16   - pertinent xrays, CT, MRI studies were reviewed and independently interpreted  Positive ROS: All other  systems have been reviewed and were otherwise negative with the exception of those mentioned in the HPI and as above.  Physical Exam: General: Sedated. Cardiovascular: Tachycardic Respiratory: Satting in the high 90s GI: No organomegaly, abdomen is soft and non-tender Skin: Dry gangrene of toes Neurologic: unable to be tested due to sedation Psychiatric: Patient is sedated Lymphatic: No axillary or cervical lymphadenopathy  MUSCULOSKELETAL:  Right foot - dry gangrene of 1st-3rd toes, pitting edema, nonpalpable pulses - posterior calf ulcer  Left foot - dry gangrene of 5th toe, nonpalpable pulses - posterior calf ulcer  Assessment: BLE dry gangrene Sepsis ESRD, Afib, CHF DNR/DNI  Plan: - had discussion with wife who is HCPOA on their wishes - I agree with Dr. Manya Silvas assessment that surgery is very high risk  - with his severe PVD, I do not think he would be able to heal any surgery either - I agree that comfort care and palliative care are the appropriate route - ortho signed off  Thank you for the consult and the opportunity to see Mr. Arval Mcguiness. Eduard Roux, MD [place] 4:20 PM

## 2015-05-30 NOTE — Progress Notes (Addendum)
  Addendum: Chart reviewed. Palliative Care meeting not until tomorrow. Surgery cancelled. Will stop IVF and all non-essential meds. Has been getting prn Fentanyl boluses for severe LE pain. Will start fentanyl gtt for comfort.   Bensimhon, Daniel,MD 11:45 PM

## 2015-05-30 NOTE — Progress Notes (Signed)
PT Cancellation Note  Patient Details Name: [name] MRN: [id number] DOB: 08/08/1940   Cancelled Treatment:    Reason Eval/Treat Not Completed: Patient at procedure or test/unavailable (MRI tech present to take pt to MRI.  Will check back as able)Thanks.   Irwin Brakeman F 05/30/2015, 10:18 AM  [facility] [phone] (pager)

## 2015-05-30 NOTE — Consult Note (Addendum)
CARDIOLOGY CONSULT NOTE  Patient ID: [name] MRN: [id number], DOB/AGE: October 15, 1940   Admit date: 05/30/2015   Primary Physician: [name], MD Primary Cardiologist: Dr. Radford Pax Referring physician: [place]  Pt. 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 Cardiology has been consulted by Dr. Dyann Kief for surgical clearance, in the event that he will need amputation. He has extensive cardiovascular history including AS s/p TAVR 01/2015, chronic atrial fibrillation with history of left atrial appendage clot and is currently on chronic warfarin therapy, HOCM, chronic diastolic CHF and HTN, along with ESRD on HD and prior h/o bladder cancer and DVT.    Problem List  Past Medical History  Diagnosis Date  . Atrial fibrillation, persistent ([place])     a. cardioversion 11/21/10 . b. 05/2013 - experiencing more SOB. 24-hr holter showed avg HR 49, max 82, min 38bpm. ETT showed baseline junctional bradycardia, HR incr only to 90bpm with drop in BP. Saw EP - amiodarone reduced and eventually d/c'd with improvement in dyspnea.  . Hypertension   . Warfarin anticoagulation   . Aortic valve stenosis   . Orthostatic hypotension   . Diastolic dysfunction   . Arthritis   . Self-catheterizes urinary bladder   . Junctional bradycardia   . NSVT (nonsustained ventricular tachycardia) ([place])     a. By holter 01/2014.  [name] Shortness of breath dyspnea   . PVD (peripheral vascular disease) ([place])     critical limb ischemia  . Cardiomyopathy ([place])     non-ischemic  . Hypomagnesemia   . A-V fistula ([place])   . CAD (coronary artery disease) 12/16/14    a. cath - nonobstrucive CAD 30% pro RCA; 25% dis RCA; 40T pro Lcx  . Heart murmur   . Dysrhythmia   . CHF (congestive heart failure) ([place])   . Full dentures   . Uses hearing aid   . Chronic diarrhea     takes imodium BID  . Pneumonia     hx of  . Hemodialysis patient [facility])    M,W,F - dialysis initiated August 2016  . Bruises easily   . Bladder cancer ([place])     a. 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  . 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 CKD (chronic kidney disease), stage V ([place])     a. Followed by Dr. Jimmy Footman. b. IV fistula placed 04/17/14. c. initiation of HD August 2016  . RTA (renal tubular acidosis)   . ESRD (end stage renal disease) on dialysis [facility])     "MWF; [name]" (03/05/2015)  . Paravalvular leak of prosthetic heart valve     Past Surgical History  Procedure Laterality Date  . Cardioversion  11/21/2010  . Cystectomy  2002  . Cholecystectomy    . Cataract extraction w/ intraocular lens  implant, bilateral    . Colonoscopy    . Av fistula placement Left 04/17/2014    Procedure: ARTERIOVENOUS (AV) FISTULA CREATION- LEFT UPPER ARM ;  Surgeon: Mal [name], MD;  Location: [place];  Service: Vascular;  Laterality: Left;  . Bladder surgery  2002    radical cystectomy  . Vascular surgery    . Multiple tooth extractions    . Cardiac catheterization N/A 12/16/2014    Procedure: Right/Left Heart Cath and Coronary Angiography;  Surgeon: [name],  MD;  Location: [place] CV LAB;  Service: Cardiovascular;  Laterality: N/A;  . Eye surgery    . Colon surgery  Nov 2002    reconstructed bladder from colon. per pt. at [facility]  . Tee without cardioversion N/A 01/01/2015    Procedure: TRANSESOPHAGEAL ECHOCARDIOGRAM (TEE);  Surgeon: [name], MD;  Location: [facility] ENDOSCOPY;  Service: Cardiovascular;  Laterality: N/A;  . Transcatheter aortic valve replacement, transapical N/A 02/16/2015    Procedure: TRANSCATHETER AORTIC VALVE REPLACEMENT, TRANSAPICAL;  Surgeon: [name], MD;  Location: [place];  Service: Open Heart Surgery;  Laterality: N/A;  . Tee without cardioversion N/A 02/16/2015    Procedure:  TRANSESOPHAGEAL ECHOCARDIOGRAM (TEE);  Surgeon: [name], MD;  Location: [place];  Service: Open Heart Surgery;  Laterality: N/A;  . Tavr -valve researched and ok fpr 1.5t or 3t mri  researched on 05/30/2015    [name] XT transcatheter heart valve (size 13mm, model # 9300TFX, serial [id number]     Allergies  Allergies  Allergen Reactions  . Amiodarone Other (See Comments)    Severely reduced DLCO  . Oxycodone Hcl Rash and Other (See Comments)    Rash and itching    HPI  75 y/o male admitted by IM for sepsis in the setting of gangrene + osteomyelitis in left foot. Cardiology has been consulted for surgical clearance, in the event that he will need amputation.   He has an extensive cardiovascular history. He has a history of aortic stenosis, chronic atrial fibrillation, HOCM, hypertension, diastolic heart failure, as well as end-stage renal disease on dialysis, orthostatic hypotension, bladder cancer status post resection, and DVT. He has a history of left atrial appendage clot and is currently on chronic warfarin therapy. He had a TAVR 02/19/15 via the transapical approach.   Family says since starting HD 5 months ago has grown progressively weak. [place] recently when legs gave out and had facial trauma. Admitted now with increasing weakness, leg pain and inability to stand. Found to have LE osteo. Pending possible LE amputation.  He is now lethargic and responds only to pain. Wife is his 68 and is clear that he would not want further surgery. Wants to proceed with comfort care.     Home Medications  Prior to Admission medications   Medication Sig Start Date End Date Taking? Authorizing Provider  acetaminophen (TYLENOL) 500 MG tablet Take 1,000 mg by mouth every 6 (six) hours as needed for mild pain or moderate pain.   Yes Historical Provider, MD  aspirin EC 81 MG tablet Take 81 mg by mouth at bedtime.   Yes Historical Provider, MD  Loperamide-Simethicone (IMODIUM  MULTI-SYMPTOM RELIEF PO) Take 1 tablet by mouth 2 (two) times daily.   Yes Historical Provider, MD  metoprolol succinate (TOPROL-XL) 50 MG 24 hr tablet Take 50 mg by mouth at bedtime. Take with or immediately following a meal.   Yes Historical Provider, MD  multivitamin (RENA-VIT) TABS tablet Take 1 tablet by mouth daily.   Yes Historical Provider, MD  Povidone-Iodine (BETADINE EX) Apply 1 application topically every other day. Apply to hard-crusted wounds   Yes Historical Provider, MD  Probiotic Product (ALIGN PO) Take 1 tablet by mouth daily.   Yes Historical Provider, MD  sevelamer carbonate (RENVELA) 800 MG tablet Take 1,600 mg by mouth 3 (three) times daily with meals.    Yes Historical Provider, MD  Silver-Carboxymethylcellulose (AQUACEL-AG EXTRA HYDROFIBER EX) Apply 3 patches topically every other day. Apply to weeping wounds  Yes Historical Provider, MD  traMADol (ULTRAM) 50 MG tablet Take 50 mg by mouth every 6 (six) hours as needed (pain).   Yes Historical Provider, MD  warfarin (COUMADIN) 2.5 MG tablet Take as directed by coumadin clinic Patient taking differently: Take 1.25 mg by mouth at bedtime. Take as directed by coumadin clinic 04/30/15  Yes Sueanne Margarita, MD  diltiazem (DILTIAZEM CD) 180 MG 24 hr capsule Take 1 capsule (180 mg total) by mouth daily. Patient not taking: Reported on 05/30/2015 03/24/15   Will Meredith Leeds, MD  metoprolol tartrate (LOPRESSOR) 25 MG tablet Take 2 tablets (50 mg total) by mouth every morning.  Take 1 tablet (25 mg total) by mouth every evening. Patient not taking: Reported on 05/24/2015 03/24/15   Will Meredith Leeds, MD    Family History  Family History  Problem Relation Age of Onset  . CVA Mother   . Heart attack Father   . Heart disease Father   . Arrhythmia Father   . Alzheimer's disease Sister   . Multiple sclerosis Brother   . Alcohol abuse Father     Social History  Social History   Social History  . Marital Status: Married     Spouse Name: N/A  . Number of Children: N/A  . Years of Education: N/A   Occupational History  . Not on file.   Social History Main Topics  . Smoking status: Former Smoker -- 2.00 packs/day for 33 years    Types: Cigarettes    Quit date: 05/02/1983  . Smokeless tobacco: Never Used  . Alcohol Use: Yes     Comment: rare  . Drug Use: No  . Sexual Activity: Not on file   Other Topics Concern  . Not on file   Social History Narrative     Review of Systems General:  No chills, fever, night sweats or weight changes.  Cardiovascular:  No chest pain, dyspnea on exertion, edema, orthopnea, palpitations, paroxysmal nocturnal dyspnea. Dermatological: No rash, lesions/masses Respiratory: No cough, dyspnea Urologic: No hematuria, dysuria Abdominal:   No nausea, vomiting, diarrhea, bright red blood per rectum, melena, or hematemesis Neurologic:  No visual changes, wkns, changes in mental status. All other systems reviewed and are otherwise negative except as noted above.  Physical Exam  Blood pressure 105/72, pulse 42, temperature 97 F (36.1 C), temperature source Oral, resp. rate 17, height 5\' 10"  (1.778 m), weight 160 lb 15 oz (73 kg), SpO2 83 %.  General: Chronically ill-appearing. lethargic Neuro: Sedated. Awakens and moans with pain. Moves all extremities spontaneously. HEENT: Normal Right eye ecchymosis and laceration  Neck: Supple without bruits JVP 7 Lungs:  Resp regular and unlabored, CTA. Heart: irreg, irregular tachy no s3, s4, or murmurs. Abdomen: Soft, non-tender, non-distended, BS + x 4.  Extremities: Gangrene and edema BLE. Calcyphylaxis ulcers on both hands  Labs  Troponin (Point of Care Test)  Recent Labs  05/09/2015 1738  TROPIPOC 0.06   No results for input(s): CKTOTAL, CKMB, TROPONINI in the last 72 hours. Lab Results  Component Value Date   WBC 27.2* 05/30/2015   HGB 8.6* 05/30/2015   HCT 27.4* 05/30/2015   MCV 111.8* 05/30/2015   PLT 202 05/30/2015      Recent Labs Lab 05/30/15 0940  NA 138  K 4.7  CL 103  CO2 21*  BUN 58*  CREATININE 6.98*  CALCIUM 8.1*  PROT 5.5*  BILITOT 0.9  ALKPHOS 117  ALT 23  AST 27  GLUCOSE 88  No results found for: CHOL, HDL, LDLCALC, TRIG No results found for: DDIMER   Radiology/Studies  Ct Head Wo Contrast  05/30/2015  CLINICAL DATA:  Fall, head and face injury, facial abrasions and bruising. Weakness and confusion. EXAM: CT HEAD WITHOUT CONTRAST CT MAXILLOFACIAL WITHOUT CONTRAST TECHNIQUE: Multidetector CT imaging of the head and maxillofacial structures were performed using the standard protocol without intravenous contrast. Multiplanar CT image reconstructions of the maxillofacial structures were also generated. COMPARISON:  04/02/2015 FINDINGS: CT HEAD FINDINGS Stable brain atrophy and chronic white matter microvascular ischemic changes throughout the periventricular white matter. No acute intracranial hemorrhage, mass lesion, definite infarction, midline shift, herniation, or hydrocephalus. No extra-axial fluid collection. No focal mass effect or edema. Cisterns are patent. Cerebellar atrophy as well. CT MAXILLOFACIAL FINDINGS Exam is limited with motion artifact despite repeating portions of the study. Small minimally depressed nasal bone fractures suspected, more so on the right. Mandible, maxilla, pterygoid plates, zygomas, skullbase, nasal septum, and orbits appear intact. Sinuses remain clear. Mastoids are clear as well. No orbital abnormality or proptosis. No orbital blowout fracture. IMPRESSION: Stable atrophy and chronic white matter microvascular ischemic changes. No interval change or acute process by noncontrast CT. Nasal bone fractures, minimally depressed on the right. No other acute facial bony trauma within the limits of the study. Electronically Signed   By: Jerilynn Mages.  Shick M.D.   On: 05/30/2015 13:16   Mr Foot Right Wo Contrast  05/30/2015  CLINICAL DATA:  Discoloration of the toes with  foul odor, query osteomyelitis. EXAM: MRI OF THE RIGHT FOREFOOT WITHOUT CONTRAST TECHNIQUE: Multiplanar, multisequence MR imaging was performed. No intravenous contrast was administered. COMPARISON:  05/26/2015 FINDINGS: Despite efforts by the technologist and patient, motion artifact is present on today's exam and could not be eliminated. This reduces exam sensitivity and specificity. Plantar and medial to the great toe there is abnormal subcutaneous edema signal and small locules of gas indicating ulceration and potentially tissue necrosis. This is especially just plantar to the sesamoids. The soft tissues overlying the tufts of the great toe and second toe appear thin, possibly from prior resections. There is questionable edema signal in the tuft of the great toe, for example with poor definition of the normal high T1 signal on images 12-13 of series 6 and a suggestion of possible high-signal in the tuft on image 16 of series 10. There is mild abnormal edema tracking along the dorsum of the foot and along the plantar musculature of the foot. The edema is most confluent along the medial ball of the foot. The muscular edema is most confluent within the muscle and around the tendon of the adductor hallucis. IMPRESSION: 1. Extensive edema and speckled gas tracking along the medial ball of the foot in the vicinity of the first digit sesamoids and first digit MTP joint, as shown on conventional radiography, compatible with ulceration and some tissue necrosis in this vicinity. There is some fluid tracking along the tendon of the abductor hallucis muscle compatible with tenosynovitis which could be infectious. However, we do not observe osteomyelitis involving the first metatarsal or proximal phalanx or sesamoids. 2. Suspected edema in the tuft of the distal phalanx of the great toe. This is fairly minimal and could simply be reactive rather than necessarily indicating osteomyelitis. There is a suggestion of mild  thinning of the soft tissues along the distal margins of the great toe and second toe, query prior resections. 3. Dorsal subcutaneous edema in the foot potentially reflecting cellulitis. Low grade edema  tracks along the plantar musculature of the foot, especially the abductor hallucis muscle, and may reflect myositis. I do not see an overt drainable abscess. Electronically Signed   By: Van Clines M.D.   On: 05/30/2015 12:36   Mr Foot Left Wo Contrast  05/30/2015  CLINICAL DATA:  Osteomyelitis EXAM: MRI OF THE LEFT FOREFOOT WITHOUT CONTRAST TECHNIQUE: Multiplanar, multisequence MR imaging was performed. No intravenous contrast was administered. COMPARISON:  05/08/2015 FINDINGS: Despite efforts by the technologist and patient, motion artifact is present on today's exam and could not be eliminated. This reduces exam sensitivity and specificity. There is severe arthropathy at the Lisfranc joint with spurring, subcortical cystic lesions, and erosions. This is most striking distally in the middle cuneiform or there is extensive low T1 and high T2 signal and poor definition of cortical margins, and conceivably some bony fragmentation. Low-level osseous edema noted in the bases of the metatarsals, most notably the third, and there is intermetatarsal arthropathy as well. There is no malalignment at the Lisfranc joint but the Lisfranc ligament itself is highly indistinct. The findings at the Lisfranc joint are more in keeping of erosive arthropathy or early Charcot arthropathy rather than osteomyelitis. Despite the lack of clear visualization of the Lisfranc ligament, the degree of arthropathy without subluxation makes this less likely to be due to Lisfranc joint instability. On T1 weighted images there is some faint low signal in the shaft of the proximal phalanx small toe. This digit is difficult Celsius on other imaging sequences due to motion. There is extensive subcutaneous edema tracking along the dorsum of  the foot. This tracks into the toes. No drainable abscess observed. IMPRESSION: 1. Notable arthropathy at the Lisfranc joint favors erosive arthropathy or early Charcot arthropathy over infection. The Lisfranc ligament is not well seen, but there is no current malalignment at the Lisfranc joint to suggest that this is necessarily the sequela of Lisfranc joint instability. 2. Dorsal subcutaneous edema is extensive in the foot and tracks into the toes, and could reflect cellulitis. 3. Faintly reduced T1 signal in the proximal phalanx of the small toe is only suggested on the coronal T1 weighted images. Although this could be an early sign of osteomyelitis the lack of correlating findings on other sequences is not further supportive. Correlate with any particular confluence of findings involving the small toe clinically such as swelling, erythema, or ulceration. Electronically Signed   By: Van Clines M.D.   On: 05/30/2015 12:21   Dg Chest Portable 1 View  05/09/2015  CLINICAL DATA:  75 year old with end-stage renal disease on hemodialysis, patient missed dialysis yesterday, presenting with 2-3 day history of generalized weakness. Hypotension and tachycardia upon presentation. EXAM: PORTABLE CHEST 1 VIEW COMPARISON:  03/23/2015 and earlier. FINDINGS: Cardiac silhouette moderately enlarged for AP portable technique, unchanged. Prior TAVR procedure. Dense mitral annular calcification is noted previously. Prominent paracardiac fat pad on the left. Lungs clear. Bronchovascular markings normal. Pulmonary vascularity normal. No visible pleural effusions. No pneumothorax. IMPRESSION: Stable cardiomegaly.  No acute cardiopulmonary disease. Electronically Signed   By: Evangeline Dakin M.D.   On: 05/20/2015 17:41   Dg Foot Complete Left  05/18/2015  CLINICAL DATA:  Severe weakness in both legs for 3 days. Pain and swelling mostly in the left foot. Bilateral leg pain. EXAM: LEFT FOOT - COMPLETE 3+ VIEW COMPARISON:   None. FINDINGS: Examination is limited due patient positioning. Diffuse bone demineralization. No acute fracture or dislocation. There is soft tissue swelling over the dorsum of the  left foot and ankle and over the plantar aspect of the forefoot. No soft tissue gas collections or radiopaque foreign bodies are identified. There is evidence of underlying bone erosion with cortical loss and bone sclerosis involving the second and third tarsometatarsal joints with focal cortical loss at the dorsal aspect of the middle cuneiform. There is also focal erosion demonstrated in the base of the first metatarsal bone. These changes are suspicious for osteomyelitis. Diffuse vascular calcification consistent with sides. IMPRESSION: Diffuse soft tissue swelling over the left foot vertically at the forefoot region. Cortical loss, focal bone destruction, and sclerosis involving the second and third metatarsal phalangeal joints and focal bone erosion involving the proximal aspect of the first metatarsal bone. Appearance is suspicious for osteomyelitis. Electronically Signed   By: Lucienne Capers M.D.   On: 05/31/2015 20:55   Dg Foot Complete Right  05/03/2015  CLINICAL DATA:  Severe weakness in both legs for 3 days. More pain in the left foot. Left foot is swollen. Bilateral leg pain. EXAM: RIGHT FOOT COMPLETE - 3+ VIEW COMPARISON:  None. FINDINGS: Diffuse bone demineralization. No evidence of acute fracture or dislocation. Soft tissue swelling and soft tissue gas adjacent to the first metatarsal-phalangeal joint. This suggest infection/ cellulitis due to gas-forming organism. Underlying bones appear intact. There is no evidence of bone loss or cortical destruction or sclerosis that would suggest evidence of osteomyelitis. MRI would be more sensitive if clinically indicated. Plantar calcaneal spur. Extensive vascular calcifications consistent with diabetes. IMPRESSION: Soft tissue swelling and soft tissue gas around the first  metatarsal-phalangeal joint suggesting cellulitis with gas-forming organism. No bone changes that would suggest osteomyelitis. Electronically Signed   By: Lucienne Capers M.D.   On: 05/12/2015 20:52   Ct Maxillofacial Wo Cm  05/30/2015  CLINICAL DATA:  Fall, head and face injury, facial abrasions and bruising. Weakness and confusion. EXAM: CT HEAD WITHOUT CONTRAST CT MAXILLOFACIAL WITHOUT CONTRAST TECHNIQUE: Multidetector CT imaging of the head and maxillofacial structures were performed using the standard protocol without intravenous contrast. Multiplanar CT image reconstructions of the maxillofacial structures were also generated. COMPARISON:  04/02/2015 FINDINGS: CT HEAD FINDINGS Stable brain atrophy and chronic white matter microvascular ischemic changes throughout the periventricular white matter. No acute intracranial hemorrhage, mass lesion, definite infarction, midline shift, herniation, or hydrocephalus. No extra-axial fluid collection. No focal mass effect or edema. Cisterns are patent. Cerebellar atrophy as well. CT MAXILLOFACIAL FINDINGS Exam is limited with motion artifact despite repeating portions of the study. Small minimally depressed nasal bone fractures suspected, more so on the right. Mandible, maxilla, pterygoid plates, zygomas, skullbase, nasal septum, and orbits appear intact. Sinuses remain clear. Mastoids are clear as well. No orbital abnormality or proptosis. No orbital blowout fracture. IMPRESSION: Stable atrophy and chronic white matter microvascular ischemic changes. No interval change or acute process by noncontrast CT. Nasal bone fractures, minimally depressed on the right. No other acute facial bony trauma within the limits of the study. Electronically Signed   By: Jerilynn Mages.  Shick M.D.   On: 05/30/2015 13:16    ECG  Chronic atrial fibrillation with RVR 149 bpm   ASSESSMENT AND PLAN  Principal Problem:   Gangrene ([place]) Active Problems:   Aortic stenosis, severe   Chronic  diastolic CHF, class 3   Metabolic acidosis   Atrial fibrillation with RVR ([facility])   S/P TAVR (transcatheter aortic valve replacement)   ESRD (end stage renal disease) on dialysis ([place])   Peripheral arterial disease ([facility])   Severe protein-calorie malnutrition ([place])  Pressure ulcer    Signed, Lyda Jester, PA-C 05/30/2015, 2:44 PM  Patient seen and examined with Lyda Jester, PA-C. We discussed all aspects of the encounter. I agree with the assessment as stated above. He has multiple active issues with progressive failure to thrive. Now with BLE gangrene pending amputation. I discussed the situation at length with his wife who is HCPOA says he would not want to proceed with amputation. Wants pain control and full comfort care. I agree that this most appropriate approach. Palliative Care Consult has been called. We will sign off. Please call if we can assist.   Bensimhon, Daniel,MD 3:57 PM   Addendum: Chart reviewed. Palliative Care Consult not until tomorrow. Surgery cancelled. Will stop IVF and all non-essential meds. Has been getting prn Fentanyl boluses for severe LE pain. Will start fentanyl gtt for comfort.   Bensimhon, Daniel,MD 11:26 PM

## 2015-05-31 ENCOUNTER — Telehealth: Payer: Self-pay

## 2015-05-31 MED ORDER — GLYCOPYRROLATE 0.2 MG/ML IJ SOLN
0.2000 mg | INTRAMUSCULAR | Status: DC | PRN
  Administered 2015-06-01: 0.2 mg via INTRAVENOUS
  Filled 2015-05-31: qty 1

## 2015-05-31 MED ORDER — GABAPENTIN 300 MG PO CAPS: 300.0000 mg | ORAL_CAPSULE | Freq: Two times a day (BID) | ORAL | Status: DC | PRN

## 2015-05-31 MED ORDER — LORAZEPAM 2 MG/ML IJ SOLN: 1.0000 mg | INTRAMUSCULAR | Status: DC | PRN

## 2015-05-31 MED ORDER — FENTANYL BOLUS VIA INFUSION
50.0000 ug | INTRAVENOUS | Status: DC | PRN
  Administered 2015-05-31 – 2015-06-01 (×4): 50 ug via INTRAVENOUS
  Filled 2015-05-31: qty 50

## 2015-05-31 NOTE — Progress Notes (Signed)
Nutrition Brief Note  Chart reviewed. Pt now transitioning to comfort care.  No further nutrition interventions warranted at this time.  Please re-consult as needed.   Shali Vesey A. Amor Hyle, RD, LDN, CDE Pager: [phone] After hours Pager: [phone]  

## 2015-05-31 NOTE — Consult Note (Signed)
[facility] [employer] Received request from [place] for family interest in [facility]. Chart reviewed and met with patient's spouse to complete registration paper work in anticipation of transfer to [facility], 21-Jun-2015. Spouse relieved room is available. Dr. Orpah Melter to assume care per spouse's preference.CSW [place] and Dr. Thereasa Solo aware.   RN please call report to [phone].  Please fax discharge summary to [phone].  Thank you. Erling Conte, [place]

## 2015-05-31 NOTE — Clinical Social Work Note (Signed)
Clinical Social Work Assessment  Patient Details  Name: [name] MRN: [id number] Date of Birth: [birth year]  Date of referral:  05/31/15               Reason for consult:  Facility Placement                Permission sought to share information with:  Chartered certified accountant granted to share information::  Yes, Verbal Permission Granted  Name::     Chiropractor::  [facility]  Relationship::  facility Optician, dispensing Information:     Housing/Transportation Living arrangements for the past 2 months:  Hydrographic surveyor of Information:  Medical Team Patient Interpreter Needed:  None Criminal Activity/Legal Involvement Pertinent to Current Situation/Hospitalization:  No - Comment as needed Significant Relationships:  Spouse Lives with:  Spouse Do you feel safe going back to the place where you live?  No Need for family participation in patient care:  Yes (Comment) (help with decision making)  Care giving concerns:  Pt too sick to return home- requiring symptom management during active dying   Facilities manager / plan:  Palliative discussed hospice facility with family  Employment status:  Retired Forensic scientist:  Medicare PT Recommendations:  Not assessed at this time Information / Referral to community resources:   (hospice)  Patient/Family's Response to care:  Family agreeable to hospice placement and wanting for pt to be comfortable  Patient/Family's Understanding of and Emotional Response to Diagnosis, Current Treatment, and Prognosis:  Family is sad but understanding of prognosis.  Emotional Assessment Appearance:  Appears stated age Attitude/Demeanor/Rapport:  Unable to Assess Affect (typically observed):  Unable to Assess Orientation:  Oriented to Self, Oriented to Place Alcohol / Substance use:  Not Applicable Psych involvement (Current and /or in the community):  No (Comment)  Discharge Needs  Concerns to be addressed:  Care  Coordination Readmission within the last 30 days:  No Current discharge risk:  Terminally ill Barriers to Discharge:  Hospice Bed not available   Cranford Mon, LCSW 05/31/2015, 4:22 PM

## 2015-05-31 NOTE — Consult Note (Signed)
   [facility] CM Inpatient Consult   05/31/2015  [name] 1940/05/04 [id number] Patient was screened for multiple admissions in the past 6 months through his Medicare.  EPIC/CHL chart review reveals that the patient is now Hospice with plans to discharge to [facility].  This patient will have full case management services through Hospice. No community care management services identified.  Will no longer follow.  For questions, please contact:  Natividad Brood, RN BSN [facility] Liaison  [phone] business mobile phone Toll free office [phone]

## 2015-05-31 NOTE — Progress Notes (Signed)
[place] TEAM 1 - Stepdown/ICU TEAM PROGRESS NOTE  ALISSA DECOSMO [id number] DOB: Apr 20, 1941 DOA: 05/31/2015 PCP: [name], MD  Admit HPI / Brief Narrative: 75 y.o. male with a history of ESRD on HD MWF, chronic diastolic CHF NYHA III, severe AS s/p TAVR 01/2015, and worsening PVD with chronic leg wounds who presented with inability to walk.  Since starting dialysis 6 months ago, the patient had suffered from intra- and post-dialytic hypotension and also progressive fatigue and SOB with exertion. This did not improve with TAVR and the patient and his wife noted that it just seemed to get steadily worse.  In the week prior to this admit, his weakness progressed. He was so tired that he fell and was too weak to leave the house, and couldn't even get off the toilet.   In the ED, he was in Afib with RVR, but mentating well. He was evaluated by PCCM who diagnosed elevated lactic acid from progressive gangrene and recommended stepdown admission.  HPI/Subjective: The patient and his family have decided to transition to full comfort care only, and desire placement at [facility].    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 -has been evaluated by Cardiology during this hospital stay   ESRD on HD -w/ focus on comfort care, no further HD treatments are to be carried out   Anemia of renal disease  -Stable  Hyponatremia -Resolved  Hypothyroidism -TSH 6.6 - no further evaluation to be carried out   Code Status: DNR - NO CODE  Family Communication: no family present at time of exam Disposition Plan: for d/c to [facility] 1/31  Consultants: Nephrology  [facility] Cardiology  Palliative Care   Procedures: None    Antibiotics: Zosyn 1/28 > 1/29 Vanc 1/28  DVT prophylaxis: comfort focused care   Objective: Blood pressure 82/62, pulse 108, temperature 98.4 F (36.9 C), temperature source Axillary, resp. rate 14, height 5\' 10"  (1.778 m), weight 73.5 kg (162 lb 0.6 oz), SpO2 99 %.  Intake/Output Summary (Last 24 hours) at 05/31/15 1203 Last data filed at 05/31/15 0800  Gross per 24 hour  Intake    711 ml  Output      0 ml  Net    711 ml   Exam: General: The pt appears to be resting comfortably, with no evidence of resp distress or discomfort  Data Reviewed:  Basic Metabolic Panel:  Recent Labs Lab 05/17/2015 1720 05/27/2015 1739 05/28/2015 2245 05/30/15 0730 05/30/15 0940  NA 135 133*  --  137 138  K 4.6 4.2  --  5.1 4.7  CL 95* 94*  --  104 103  CO2 21*  --   --  17* 21*  GLUCOSE 97 91  --  71 88  BUN 59* 59*  --  58* 58*  CREATININE 6.90* 6.70*  --  6.74* 6.98*  CALCIUM 8.6*  --   --  7.6* 8.1*  MG  --   --  1.9  --  1.9    CBC:  Recent Labs Lab 05/25/2015 1720 05/09/2015 1739 05/30/15 0940  WBC 26.4*  --  27.2*  NEUTROABS 23.5*  --  25.6*  HGB 9.2* 10.2* 8.6*  HCT 29.6* 30.0* 27.4*  MCV 112.1*  --  111.8*  PLT 248  --  202    Liver Function Tests:  Recent Labs Lab 05/31/2015 1720 05/30/15 0940  AST 33 27  ALT 25 23  ALKPHOS 118 117  BILITOT 0.7 0.9  PROT 5.9* 5.5*  ALBUMIN 1.9* 1.7*   Coags:  Recent Labs Lab 05/06/2015 2245 05/30/15 0940  INR 2.44* 2.77*    Recent Results (from the past 240 hour(s))  Culture, blood (routine x 2)     Status: None (Preliminary result)   Collection Time: 05/25/2015  5:20 PM  Result Value Ref Range Status   Specimen Description RIGHT ANTECUBITAL  Final   Special Requests BOTTLES DRAWN AEROBIC AND ANAEROBIC 5CC  Final   Culture NO GROWTH < 24 HOURS  Final   Report Status PENDING  Incomplete  Culture, blood (routine x 2)     Status: None (Preliminary result)   Collection Time: 05/27/2015  6:39 PM  Result Value Ref Range  Status   Specimen Description BLOOD RIGHT HAND  Final   Special Requests IN PEDIATRIC BOTTLE 3CC  Final   Culture NO GROWTH < 24 HOURS  Final   Report Status PENDING  Incomplete  MRSA PCR Screening     Status: None   Collection Time: 05/30/15 12:42 AM  Result Value Ref Range Status   MRSA by PCR NEGATIVE NEGATIVE Final    Comment:        The GeneXpert MRSA Assay (FDA approved for NASAL specimens only), is one component of a comprehensive MRSA colonization surveillance program. It is not intended to diagnose MRSA infection nor to guide or monitor treatment for MRSA infections.      Studies:   Recent x-ray studies have been reviewed in detail by the Attending Physician  Scheduled Meds:  Scheduled Meds: . gabapentin  300 mg Oral BID  . sodium chloride flush  3 mL Intravenous Q12H    Time spent on care of this patient: 15 mins   Nioka Thorington T , MD   [facility] Office  [phone] Pager - Text Page per Shea Evans as per below:  On-Call/Text Page:      [email or web address]      password [email or web address]  If 7PM-7AM, please contact night-coverage [email or web address] Password [email or web address] 05/31/2015, 12:03 PM   LOS: 2 days

## 2015-05-31 NOTE — Progress Notes (Addendum)
4pm [facility] anticipates have a bed for pt tomorrow- MD informed  2:30pm CSW informed of family preference for [facility]- referral made this morning- they do not have bed available today but will follow for availability tomorrow if pt is stable for transfer  CSW will continue to follow  Domenica Reamer, [place] Worker [phone]

## 2015-05-31 NOTE — Telephone Encounter (Signed)
Received MyChart message from patient:  ----- Message -----      From: [name]     Sent: 05/18/2015 12:51 PM      To: [address] Triage    Subject: Non-Urgent Medical Question                 I/25/17: Medication changes per kidney Dr. Diterding:    Stop cardizem    Take 50 mg metoprolol at bedtime     Joanna list updated. To Dr. Radford Pax for review.

## 2015-05-31 NOTE — Consult Note (Signed)
Consultation Note Date: 05/31/2015   Patient Name: [name]  DOB: 02-11-1941  MRN: [id number]  Age / Sex: 75 y.o., male  PCP: Deland Pretty, MD Referring Physician: Cherene Altes, MD  Reason for Consultation: Establishing goals of care    Clinical Assessment/Narrative: I met this morning with [name] (lethargic, minimally responsive - I did not attempt to awaken) who is unable to participate in conversation. I spoke with [name] along with his brother and sister-in-law. [name] is very much understanding of his situation and their goal to focus on keeping him comfortable. She expresses desire to get him placed in hospice facility, in particular [facility]. She names [name] as a secondary choice for EOL care as she wishes to keep him close to her in [place] (says they have long term care insurance). Explained that we may start with [facility] and she hopes that if a bed is available that he can be placed today. She does say that his children are planning on coming this upcoming weekend - I did explain that he may not live through this weekend and they should come earlier if it is important to be here before he dies. Prognosis could be days with gangrene as well as stopping HD. All questions/concerns addressed. Emotional support provided.   Discussed plan with CSW [place]. He is on fentanyl infusion that can be infused SQ at hospice - may transition to dilaudid if volume is a concern with SQ. He does appear comfortable with current dose of fentanyl 75 mcg/hr - added bolus dosing. Will add ativan for anxiety and robinul for secretions prn. Please call [phone] for any palliative needs/concerns.   Contacts/Participants in Discussion: Primary Decision Maker: [name]   Relationship to Patient spouse   SUMMARY OF RECOMMENDATIONS - Full comfort care - Family desiring hospice placement  [facility]  Code Status/Advance Care Planning: DNR    Code Status Orders        Start     Ordered   05/07/2015 2155  Do not attempt resuscitation (DNR)   Continuous    Question Answer Comment  In the event of cardiac or respiratory ARREST Do not call a "code blue"   In the event of cardiac or respiratory ARREST Do not perform Intubation, CPR, defibrillation or ACLS   In the event of cardiac or respiratory ARREST Use medication by any route, position, wound care, and other measures to relive pain and suffering. May use oxygen, suction and manual treatment of airway obstruction as needed for comfort.      05/28/2015 2154    Code Status History    Date Active Date Inactive Code Status Order ID Comments User Context   03/05/2015  8:03 PM 03/07/2015  2:57 PM Full Code [id number]  Ivor Costa, MD ED   02/16/2015 10:39 AM 03/01/2015  7:13 PM Full Code [id number]  [name], MD Inpatient   12/16/2014  4:45 PM 12/22/2014  7:39 PM Full Code [id number]  [name], MD Inpatient   12/10/2014  8:41 PM 12/16/2014  4:45 PM Full Code [id number]  Arnoldo Lenis, MD ED   06/09/2014  9:54 PM 06/13/2014  3:45 PM Full Code [id number]  Berle Mull, MD ED   05/20/2014  7:53 PM 05/22/2014  4:12 PM Full Code [id number]  Etta Quill, DO ED   04/29/2014  6:58 PM 05/02/2014  6:07 PM Full Code [id number]  Geradine Girt, DO Inpatient    Advance Directive Documentation  Most Recent Value   Type of Advance Directive  Living will, Healthcare Power of Attorney, Out of facility DNR (pink MOST or yellow form)   Pre-existing out of facility DNR order (yellow form or pink MOST form)  Pink MOST form placed in chart (order not valid for inpatient use), Yellow form placed in chart (order not valid for inpatient use)   "MOST" Form in Place?        Symptom Management:   Pain/dyspnea: Fentanyl 75 mcg/hr. Fentanyl every 15 min prn.  Anxiety: Lorazepam 1-2 mg every 4 hours prn.   Secretions: None currently. Robinul  0.2 mg every 4 hours prn.   Palliative Prophylaxis:   Frequent Pain Assessment, Oral Care and Turn Reposition  Additional Recommendations (Limitations, Scope, Preferences):  Full Comfort Care  Psycho-social/Spiritual:  Support System: Strong Desire for further Chaplaincy support:yes Additional Recommendations: Caregiving  Support/Resources and Education on Hospice  Prognosis: Hours - Days  Discharge Planning: Hospice facility   Chief Complaint/ Primary Diagnoses: Present on Admission:  . Gangrene ([place]) . Chronic diastolic CHF, class 3 . Peripheral arterial disease ([place]) . Atrial fibrillation with RVR ([place]) . Metabolic acidosis . Severe protein-calorie malnutrition ([place]) . Pressure ulcer . Nonischemic cardiomyopathy ([place]) . Subacute osteomyelitis of right foot ([place]) . Subacute osteomyelitis of left foot ([place]) . Gas gangrene ([place]) . Hyponatremia  I have reviewed the medical record, interviewed the patient and family, and examined the patient. The following aspects are pertinent.  Past Medical History  Diagnosis Date  . Atrial fibrillation, persistent ([place])     a. cardioversion 11/21/10 . b. 05/2013 - experiencing more SOB. 24-hr holter showed avg HR 49, max 82, min 38bpm. ETT showed baseline junctional bradycardia, HR incr only to 90bpm with drop in BP. Saw EP - amiodarone reduced and eventually d/c'd with improvement in dyspnea.  . Hypertension   . Warfarin anticoagulation   . Aortic valve stenosis   . Orthostatic hypotension   . Diastolic dysfunction   . Arthritis   . Self-catheterizes urinary bladder   . Junctional bradycardia   . NSVT (nonsustained ventricular tachycardia) ([place])     a. By holter 01/2014.  [name] Shortness of breath dyspnea   . PVD (peripheral vascular disease) ([place])     critical limb ischemia  . Cardiomyopathy ([place])     non-ischemic  . Hypomagnesemia   . A-V fistula ([place])   . CAD (coronary artery disease) 12/16/14    a. cath - nonobstrucive CAD  30% pro RCA; 25% dis RCA; 40T pro Lcx  . Heart murmur   . Dysrhythmia   . CHF (congestive heart failure) ([place])   . Full dentures   . Uses hearing aid   . Chronic diarrhea     takes imodium BID  . Pneumonia     hx of  . Hemodialysis patient [facility])     M,W,F - dialysis initiated August 2016  . Bruises easily   . Bladder cancer ([place])     a. 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  . 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 CKD (chronic kidney disease), stage V ([place])     a. Followed by Dr. Jimmy Footman. b. IV fistula placed 04/17/14. c. initiation of HD August 2016  . RTA (renal tubular acidosis)   . ESRD (end stage renal disease) on dialysis [facility])     "MWF; [name]" (03/05/2015)  .  Paravalvular leak of prosthetic heart valve    Social History   Social History  . Marital Status: Married    Spouse Name: N/A  . Number of Children: N/A  . Years of Education: N/A   Social History Main Topics  . Smoking status: Former Smoker -- 2.00 packs/day for 33 years    Types: Cigarettes    Quit date: 05/02/1983  . Smokeless tobacco: Never Used  . Alcohol Use: Yes     Comment: rare  . Drug Use: No  . Sexual Activity: Not Asked   Other Topics Concern  . None   Social History Narrative   Family History  Problem Relation Age of Onset  . CVA Mother   . Heart attack Father   . Heart disease Father   . Arrhythmia Father   . Alzheimer's disease Sister   . Multiple sclerosis Brother   . Alcohol abuse Father    Scheduled Meds: . gabapentin  300 mg Oral BID  . sodium chloride flush  3 mL Intravenous Q12H   Continuous Infusions: . fentaNYL infusion INTRAVENOUS 75 mcg/hr (05/31/15 0626)   PRN Meds:.acetaminophen **OR** acetaminophen, fentaNYL (SUBLIMAZE) injection, ondansetron **OR** ondansetron (ZOFRAN) IV Medications Prior to Admission:  Prior to  Admission medications   Medication Sig Start Date End Date Taking? Authorizing Provider  acetaminophen (TYLENOL) 500 MG tablet Take 1,000 mg by mouth every 6 (six) hours as needed for mild pain or moderate pain.   Yes Historical Provider, MD  aspirin EC 81 MG tablet Take 81 mg by mouth at bedtime.   Yes Historical Provider, MD  Loperamide-Simethicone (IMODIUM MULTI-SYMPTOM RELIEF PO) Take 1 tablet by mouth 2 (two) times daily.   Yes Historical Provider, MD  metoprolol succinate (TOPROL-XL) 50 MG 24 hr tablet Take 50 mg by mouth at bedtime. Take with or immediately following a meal.   Yes Historical Provider, MD  multivitamin (RENA-VIT) TABS tablet Take 1 tablet by mouth daily.   Yes Historical Provider, MD  Povidone-Iodine (BETADINE EX) Apply 1 application topically every other day. Apply to hard-crusted wounds   Yes Historical Provider, MD  Probiotic Product (ALIGN PO) Take 1 tablet by mouth daily.   Yes Historical Provider, MD  sevelamer carbonate (RENVELA) 800 MG tablet Take 1,600 mg by mouth 3 (three) times daily with meals.    Yes Historical Provider, MD  Silver-Carboxymethylcellulose (AQUACEL-AG EXTRA HYDROFIBER EX) Apply 3 patches topically every other day. Apply to weeping wounds   Yes Historical Provider, MD  traMADol (ULTRAM) 50 MG tablet Take 50 mg by mouth every 6 (six) hours as needed (pain).   Yes Historical Provider, MD  warfarin (COUMADIN) 2.5 MG tablet Take as directed by coumadin clinic Patient taking differently: Take 1.25 mg by mouth at bedtime. Take as directed by coumadin clinic 04/30/15  Yes Sueanne Margarita, MD  diltiazem (DILTIAZEM CD) 180 MG 24 hr capsule Take 1 capsule (180 mg total) by mouth daily. Patient not taking: Reported on 05/28/2015 03/24/15   Will Meredith Leeds, MD  metoprolol tartrate (LOPRESSOR) 25 MG tablet Take 2 tablets (50 mg total) by mouth every morning.  Take 1 tablet (25 mg total) by mouth every evening. Patient not taking: Reported on 05/26/2015 03/24/15    Will Meredith Leeds, MD   Allergies  Allergen Reactions  . Amiodarone Other (See Comments)    Severely reduced DLCO  . Oxycodone Hcl Rash and Other (See Comments)    Rash and itching    Review of Systems  Unable  to perform ROS   Physical Exam  Constitutional: He appears well-developed. He appears lethargic.  HENT:  Head: Normocephalic and atraumatic.  Cardiovascular: Tachycardia present.   Respiratory: Effort normal. No accessory muscle usage. No tachypnea. No respiratory distress. He has no decreased breath sounds. He has no wheezes. He has no rhonchi. He has no rales.  GI: Soft. Normal appearance.  Neurological: He appears lethargic.    Vital Signs: BP 82/62 mmHg  Pulse 108  Temp(Src) 98.4 F (36.9 C) (Axillary)  Resp 14  Ht 5' 10"  (1.778 m)  Wt 73.5 kg (162 lb 0.6 oz)  BMI 23.25 kg/m2  SpO2 99%  SpO2: SpO2: 99 % O2 Device:SpO2: 99 % O2 Flow Rate: .O2 Flow Rate (L/min): 4 L/min  IO: Intake/output summary:  Intake/Output Summary (Last 24 hours) at 05/31/15 0923 Last data filed at 05/31/15 0800  Gross per 24 hour  Intake    761 ml  Output      0 ml  Net    761 ml    LBM: Last BM Date: 05/15/2015 Baseline Weight: Weight: 73 kg (160 lb 15 oz) Most recent weight: Weight: 73.5 kg (162 lb 0.6 oz)      Palliative Assessment/Data:    Additional Data Reviewed:  CBC:    Component Value Date/Time   WBC 27.2* 05/30/2015 0940   HGB 8.6* 05/30/2015 0940   HCT 27.4* 05/30/2015 0940   PLT 202 05/30/2015 0940   MCV 111.8* 05/30/2015 0940   NEUTROABS 25.6* 05/30/2015 0940   LYMPHSABS 1.1 05/30/2015 0940   MONOABS 0.5 05/30/2015 0940   EOSABS 0.0 05/30/2015 0940   BASOSABS 0.0 05/30/2015 0940   Comprehensive Metabolic Panel:    Component Value Date/Time   NA 138 05/30/2015 0940   K 4.7 05/30/2015 0940   CL 103 05/30/2015 0940   CO2 21* 05/30/2015 0940   BUN 58* 05/30/2015 0940   CREATININE 6.98* 05/30/2015 0940   GLUCOSE 88 05/30/2015 0940   CALCIUM 8.1*  05/30/2015 0940   AST 27 05/30/2015 0940   ALT 23 05/30/2015 0940   ALKPHOS 117 05/30/2015 0940   BILITOT 0.9 05/30/2015 0940   PROT 5.5* 05/30/2015 0940   ALBUMIN 1.7* 05/30/2015 0940     Time In: 0900 Time Out: 0940 Time Total: 14mn Greater than 50%  of this time was spent counseling and coordinating care related to the above assessment and plan.  Signed by: PPershing Proud NP  APershing Proud NP  18/11/8108 9:23 AM  Please contact Palliative Medicine Team phone at [phone] questions and concerns.

## 2015-05-31 NOTE — Progress Notes (Signed)
[name], Warehouse manager called the [employer], to confirm usage of fentanyl gtt on [employer]. Crystal approved since the medication is for comfort and not sedation.

## 2015-05-31 NOTE — Progress Notes (Signed)
Pt has a order for Fentanyl gtt for pain control. Pt has not been complaining of any pain. Nurse spoke to on call cardiologist and rec'd verbal order to hold the drip orders until morning.

## 2015-06-01 ENCOUNTER — Ambulatory Visit: Payer: MEDICARE | Admitting: Cardiovascular Disease

## 2015-06-01 DIAGNOSIS — R6520 Severe sepsis without septic shock: Secondary | ICD-10-CM | POA: Diagnosis present

## 2015-06-01 DIAGNOSIS — A419 Sepsis, unspecified organism: Principal | ICD-10-CM

## 2015-06-02 NOTE — Progress Notes (Addendum)
Checked on patient and found him pulseless and without respirations.  Death confirmed by Ludwig Clarks, RN.  Time of death 56. Dr. [name] notified of patient's death.  CDS notified and patient is not a suitable donor.  Spoke with on call ME, Jeanette Caprice, due to patient history of a fall prior to admission. Per ME, patient is not an ME case because there was no evidence of serious injury (i.e. head bleed or major fracture).  Family is still at bedside. Will notify bed placement when body is transported to morgue.  59mL Fentanyl Drip wasted in sink with Mayra Neer, RN.

## 2015-06-02 NOTE — Discharge Summary (Addendum)
Death Summary  [name] [id number] DOB: 1941-02-02 DOA: 2015-06-28  PCP: [name], MD PCP/Office notified: no  Admit date: 06/28/15 Date of Death: 2015-07-01  Final Diagnoses:  Principal Problem:   Gangrene ([place]) Active Problems:   Aortic stenosis, severe   Chronic diastolic CHF, class 3   Metabolic acidosis   Atrial fibrillation with RVR (HCC)   S/P TAVR (transcatheter aortic valve replacement)   ESRD (end stage renal disease) on dialysis (HCC)   Peripheral arterial disease (HCC)   Severe protein-calorie malnutrition (HCC)   Pressure ulcer   Nonischemic cardiomyopathy (HCC)   Subacute osteomyelitis of right foot (HCC)   Subacute osteomyelitis of left foot (HCC)   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  -Has received 2L (about 2/3rds) of 30 cc/kg bolus, will defer remainder per PCCM. -Spoke with Dr [facility] who has agreed to evaluate patient; per his note;"surgery is very high risk with his severe PVD, I do not think he would be able to heal any surgery"  Afib with RVR:  -Current lead in sinus arrhythmia -Digoxin 0.125 mg daily -Metoprolol 50 mg daily -Place on Heparin drip; if patient requires surgery will need to be able to DC anticoagulation quickly -Spoke with PA [facility] Request clearance for surgery from cardiology; feel unlikely given patient's MMP will be cleared -Per Dr.Daniel R [facility]   Note; "I discussed the situation at length with his wife who is HCPOA says he would not want to proceed with amputation. Wants pain control and full comfort care"  Chronic Diastolic CHF and severe AS s/p Bovine TAVR Oct 2016:/Nonischemic Cardiomyopathy -See A. fib RVR -Transfuse for  hemoglobin<8  ESRD on HD MWF:  -Spoke with Dr. [name] who will see patient. -Patient missed his Friday HD session secondary to being unable to ambulate  -Per Dr. [name] Nephrology.  Note;"Not likely to be able to tolerate HD in this condition"  Anemia of renal disease:  -Stable -Trend CBC  Hyponatremia: -Resolved  Hypothyroidism -Current TSH= 6.6 -Obtain free T4      Hospital Course:  75 y.o. 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  Since starting dialysis 6 months ago, the patient has suffered from intra- and post-dialytic hypotension and also progressive fatigue and SOB with exertion. This did not improve with TAVR and the patient and his wife note that it just seems to get steadily worse.   Now in the last week, his weakness has progressed. Whereas he previously was able to walk short distances at home and only needed wheelchair when he left the house, this week he was so tired that he fell on Thursday, was too weak to leave the house for HD on Friday (his last HD was Weds 1/25), and then today couldn't get off the toilet.   In the ED, he was in Afib with RVR, but mentating well and appearing to oxygenate well. He was evaluated by PCCM who diagnosed elevated lactic acid from progressive gangrene and recommended stepdown admission to Medicine given the patient's  stated desires for no intubation or invasive lines.  The patient denied fever, chills, cough, sputum or change in dyspnea. He had no confusion. No diarrhea, abdominal pain, vomiting. He had only worsening rest pain in both feet, worsening discharge from leg wounds, and weakness/inability to stand.  Unfortunately on presentation  patient was found to be septic with gangrene+ osteomyelitis in left foot/gas gangrene in right foot. In addition patient had multiple medical problems to include chronic diastolic CHF with severe atrial stenosis/nonischemic cardiomyopathy, ESRD on HD. After discussion with cardiology, and surgery it was determined that resolve the problems with his lower extremities would necessitate amputation, a procedure he was unlikely to survive. After long discussion with Ms.Fogg she decided to make patient comfort care. I was paged to the bedside at~1145 on 1/31/ 2017 secondary to patient having expired.   Time: 30 minutes  Signed:  Dia Crawford, MD [facility] [phone]

## 2015-06-02 DEATH — deceased

## 2015-06-03 ENCOUNTER — Ambulatory Visit (HOSPITAL_BASED_OUTPATIENT_CLINIC_OR_DEPARTMENT_OTHER): Payer: MEDICARE

## 2015-06-03 LAB — CULTURE, BLOOD (ROUTINE X 2)
Culture: NO GROWTH
Culture: NO GROWTH

## 2015-06-25 ENCOUNTER — Ambulatory Visit: Payer: MEDICARE | Admitting: Cardiology

## 2015-09-20 ENCOUNTER — Encounter: Payer: MEDICARE | Admitting: Thoracic Surgery (Cardiothoracic Vascular Surgery)

## 2016-01-13 IMAGING — CR DG CHEST 2V
2 series · 2 of 2 positions shown · non-contrast
Comparison: March 15, 2015

CLINICAL DATA: Status post transcatheter aortic valve replacement 1
month prior. Currently with shortness of breath with exertion.

EXAM:
CHEST  2 VIEW

[w chest pa]
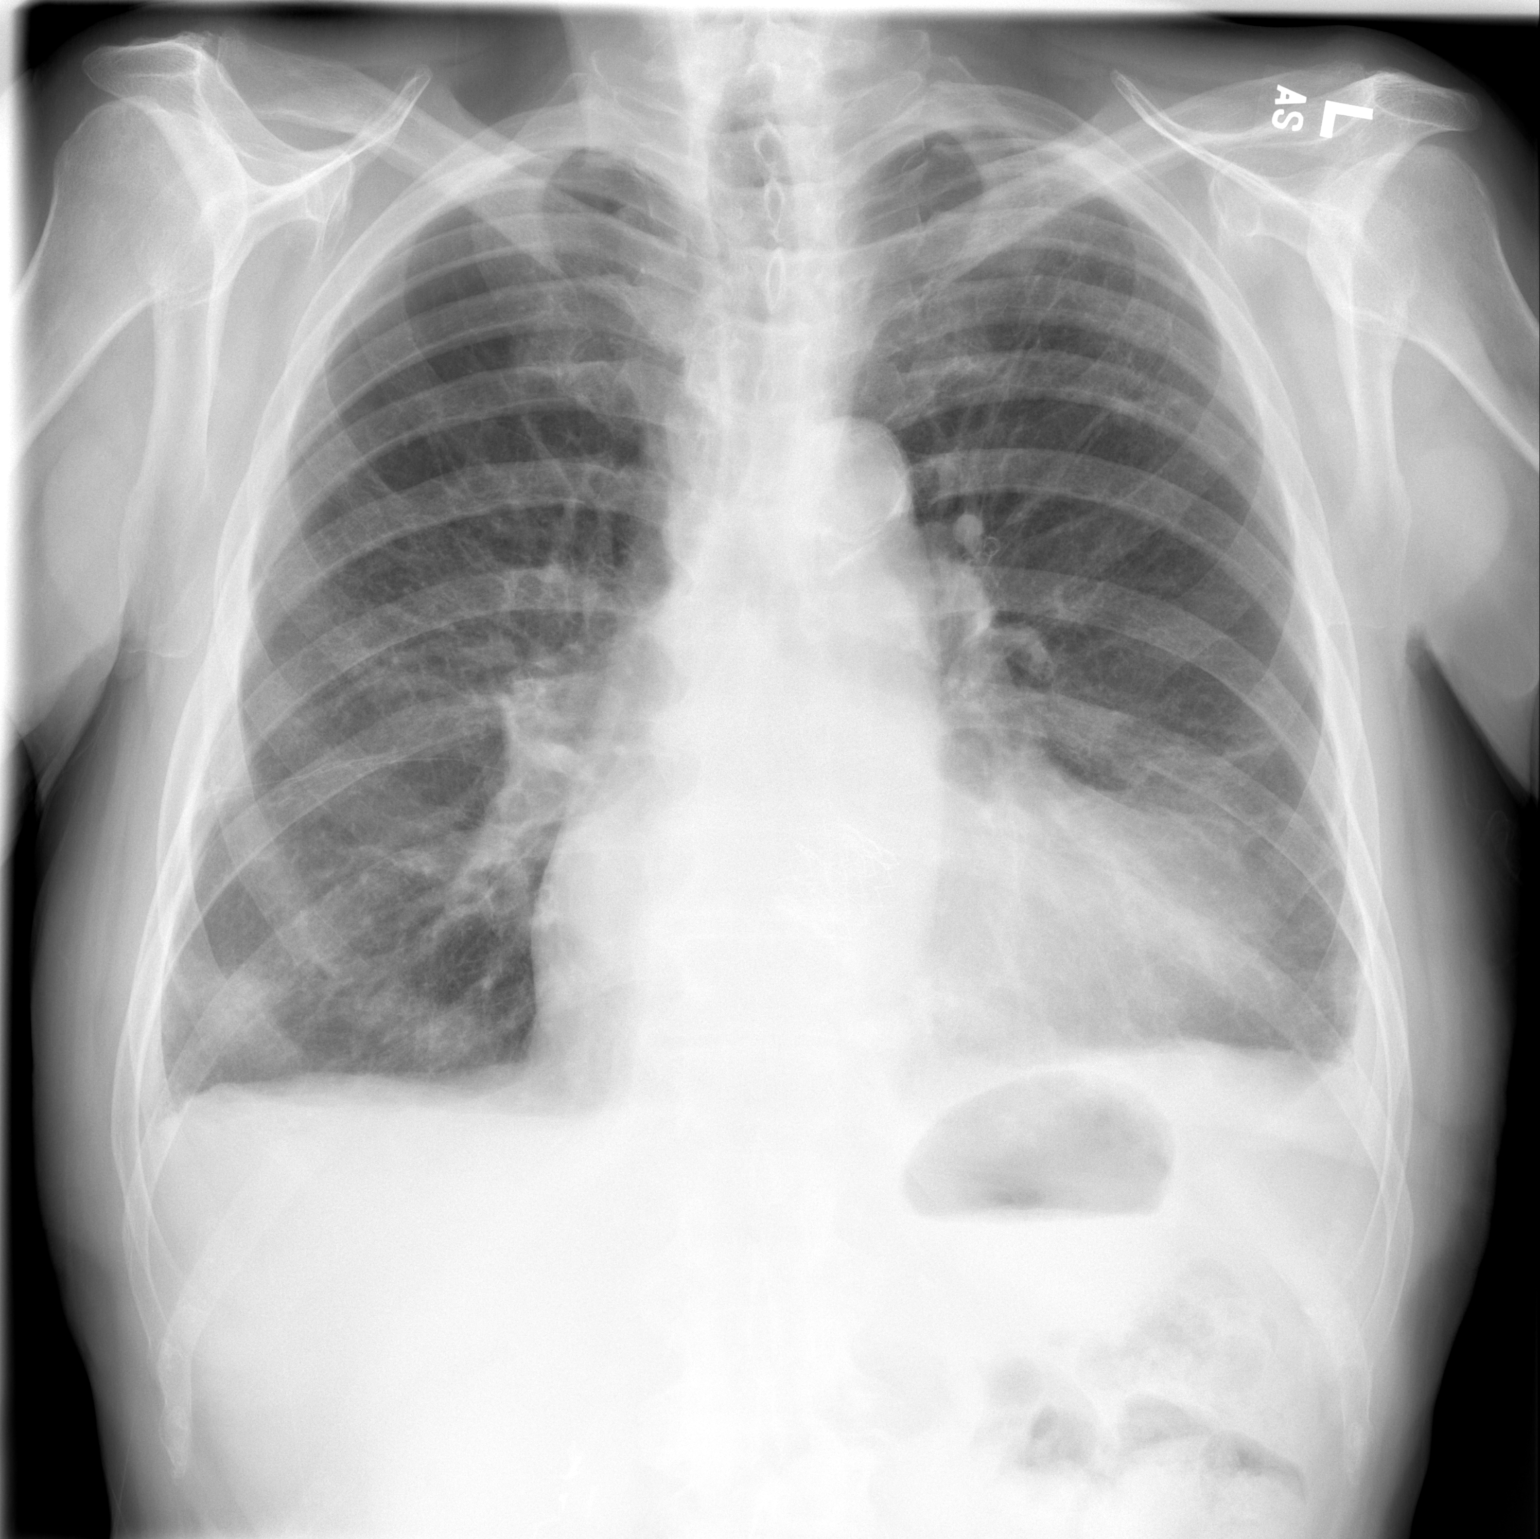

[w chest lat]
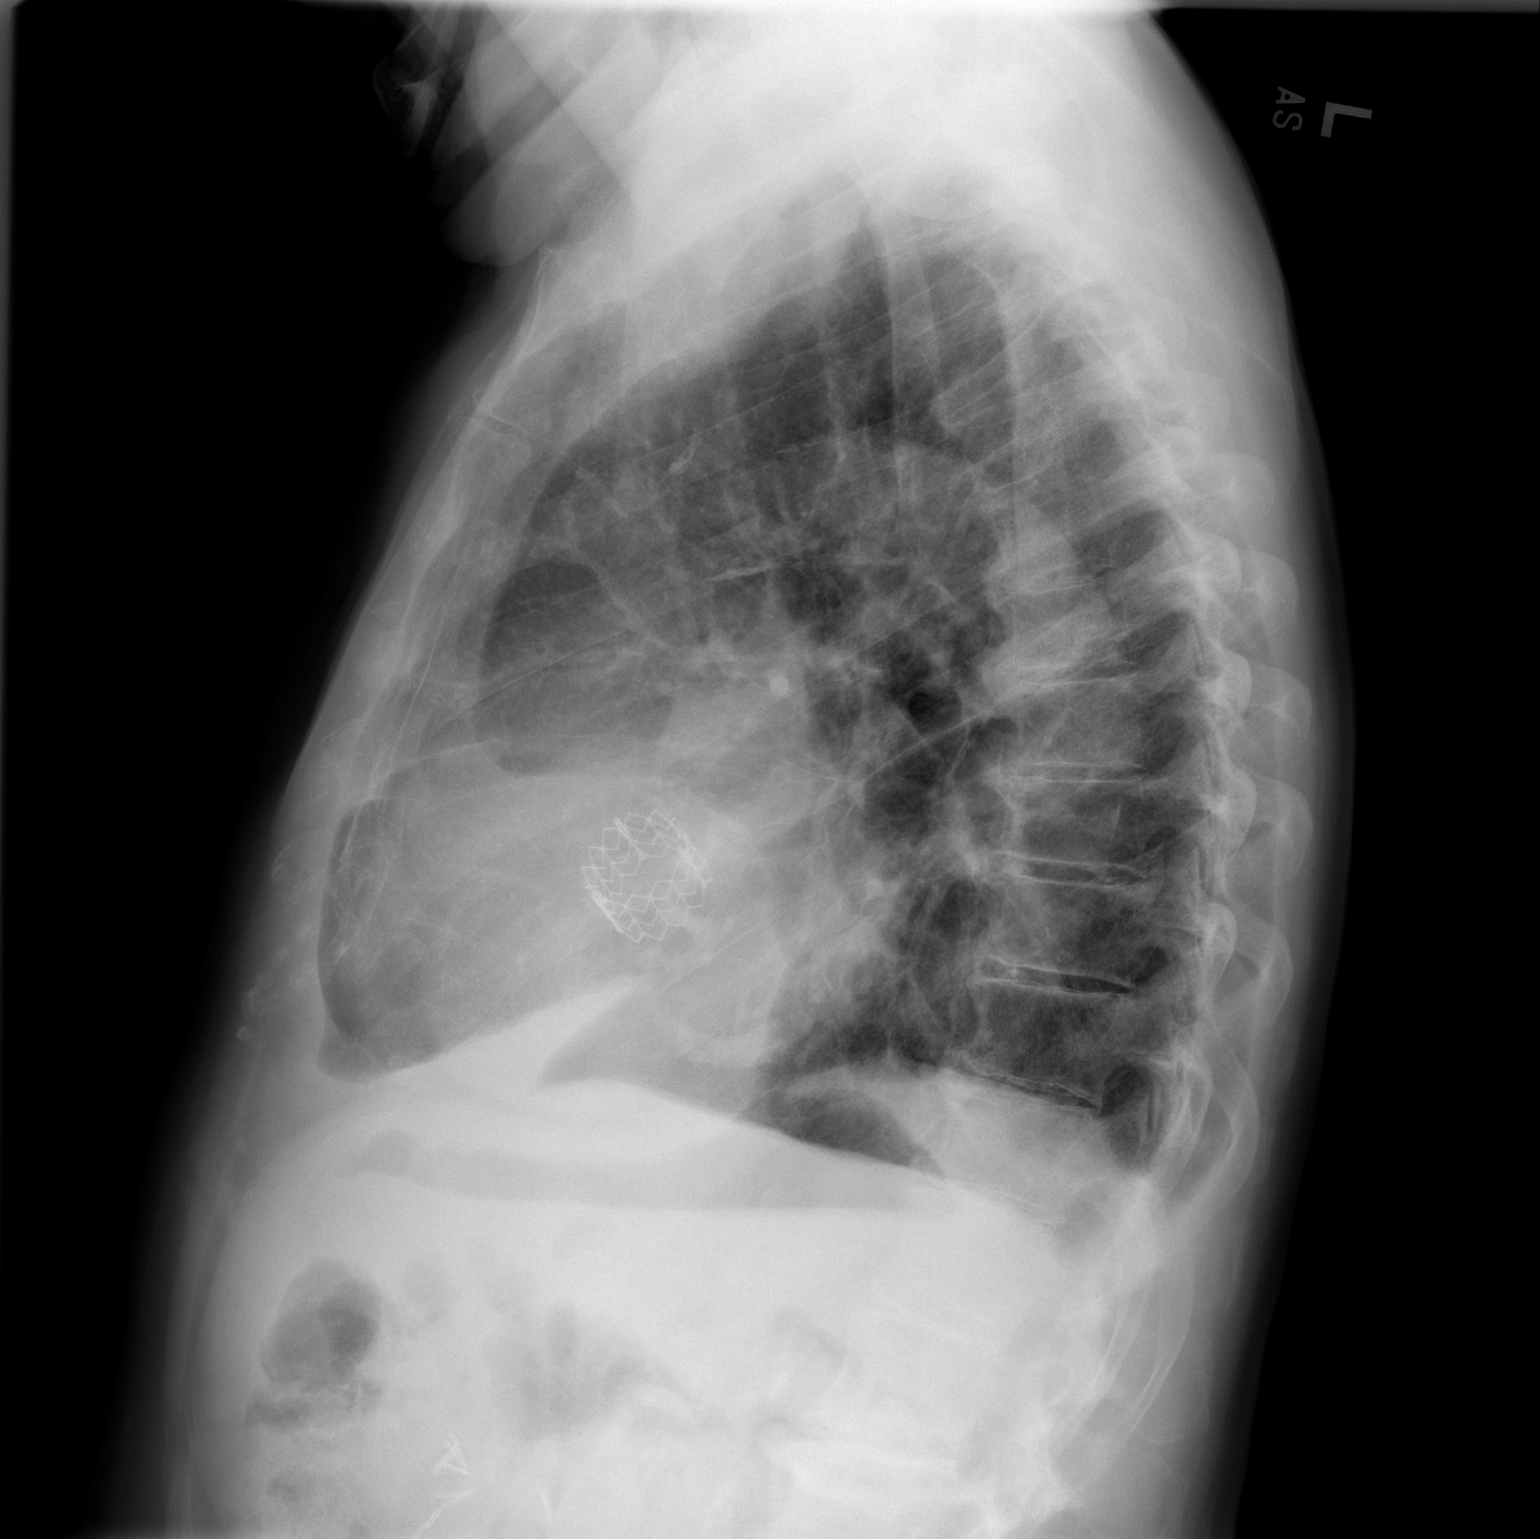

[2 of 2 positions shown; findings below may reference images not displayed]

FINDINGS: Small pleural effusions are present. There is no frank edema or
consolidation. There is slight left base and left upper lobe
atelectasis. There is cardiomegaly with pulmonary vascularity within
normal limits. No adenopathy. There is a prosthetic aortic valve
present. There is atherosclerotic change in the aorta. There is
degenerative change in the thoracic spine.
IMPRESSION: Cardiomegaly with small pleural effusions. There may be mild
congestive heart failure. However, there is no edema, and the
pulmonary vascularity is regarded as within normal limits. There is
mild atelectatic change in the left base and left upper lobe
regions.

## 2016-01-23 IMAGING — CT CT HEAD W/O CM
2 series · 16 of 30 positions shown, 18 images · non-contrast
Comparison: None.

CLINICAL DATA: 73-year-old male with fall and bruise on the
forehead.

EXAM:
CT HEAD WITHOUT CONTRAST
TECHNIQUE: Contiguous axial images were obtained from the base of the skull
through the vertex without intravenous contrast.

[Series 201: head w/o, idose (1) · axial · non-contrast · 0.45mm/px · z∈[+152,+272]mm · 8 of 32 slices shown, 10 images]
[im 4/32  brain]
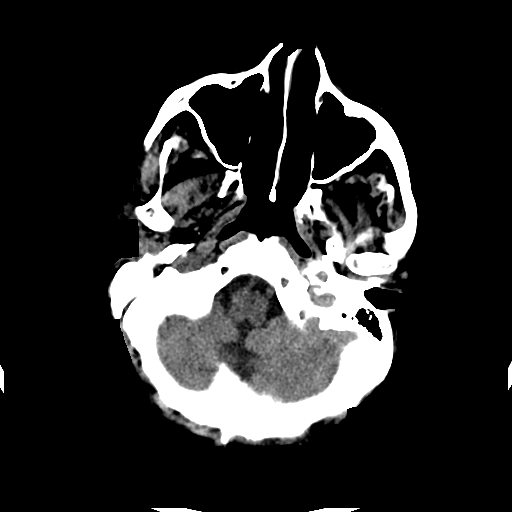
[im 4/32  bone]
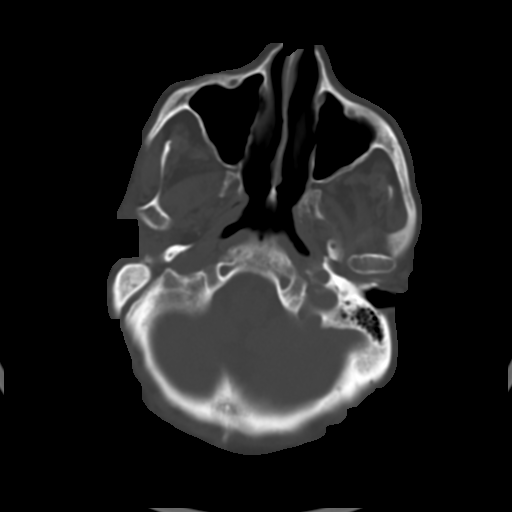
[im 7/32  brain]
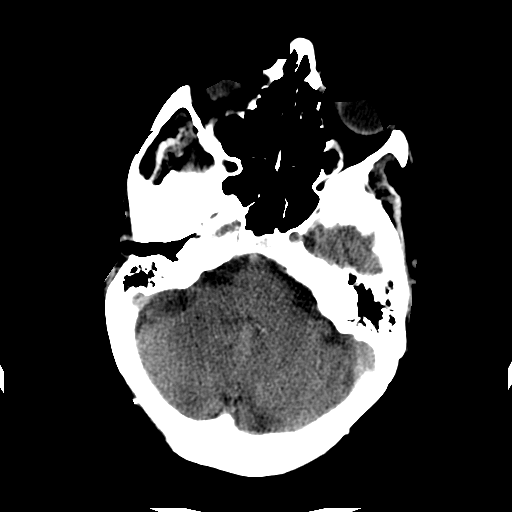
[im 11/32  brain]
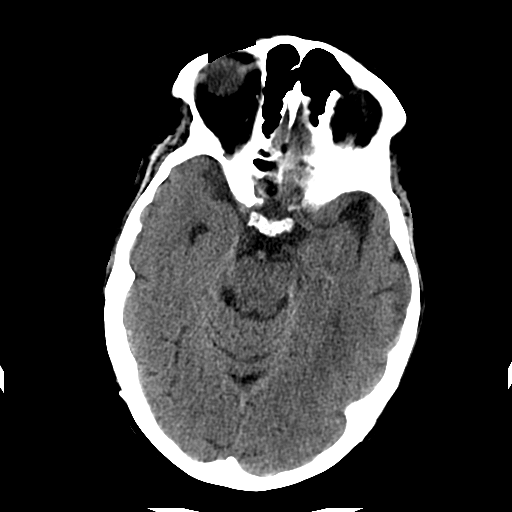
[im 14/32  brain]
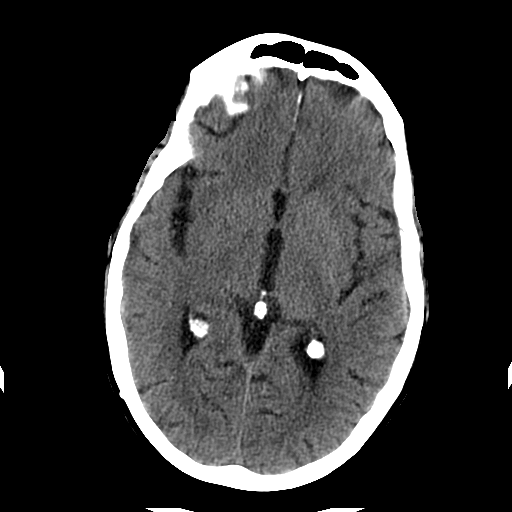
[im 18/32  brain]
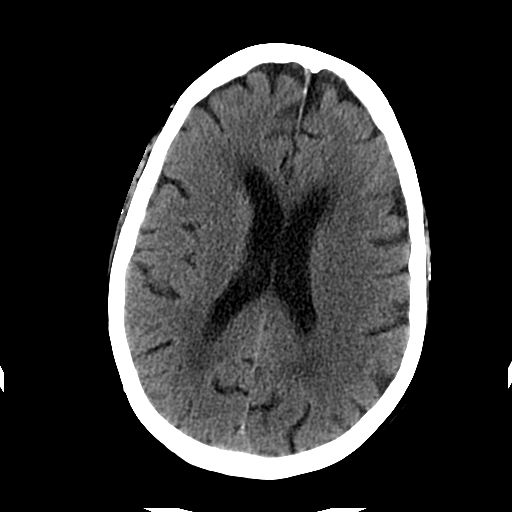
[im 18/32  bone]
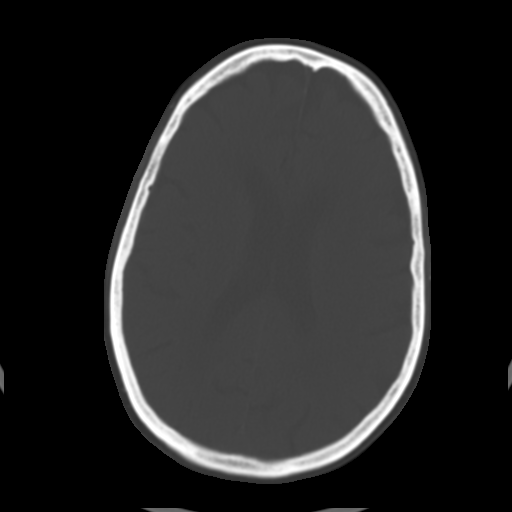
[im 21/32  brain]
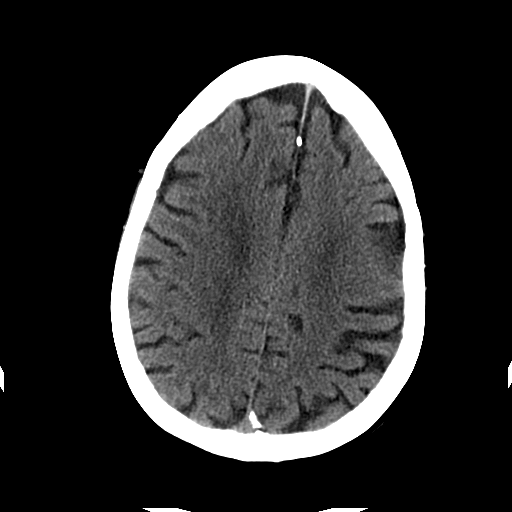
[im 25/32  brain]
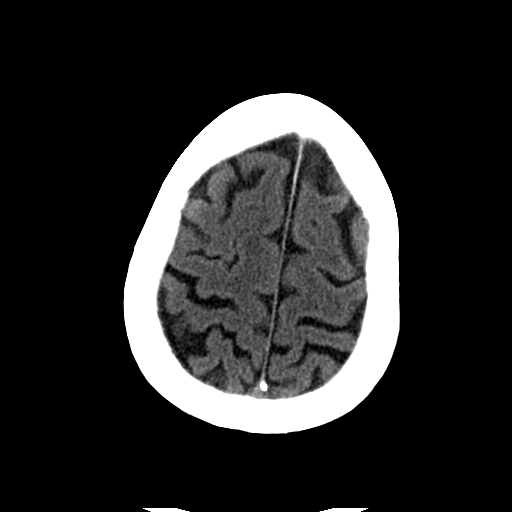
[im 28/32  brain]
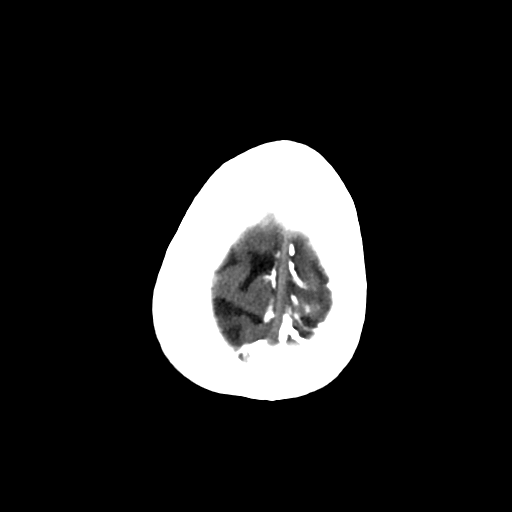

[Series 202: head w/o bone, idose (1) · axial · non-contrast · 0.45mm/px · z∈[+151,+276]mm · 8 of 64 slices shown]
[im 7/64  bone]
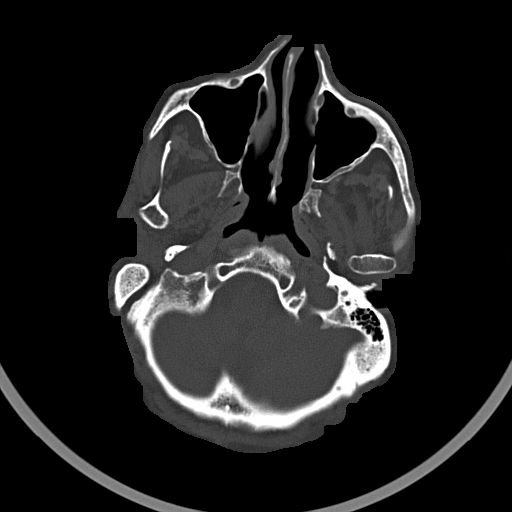
[im 14/64  bone]
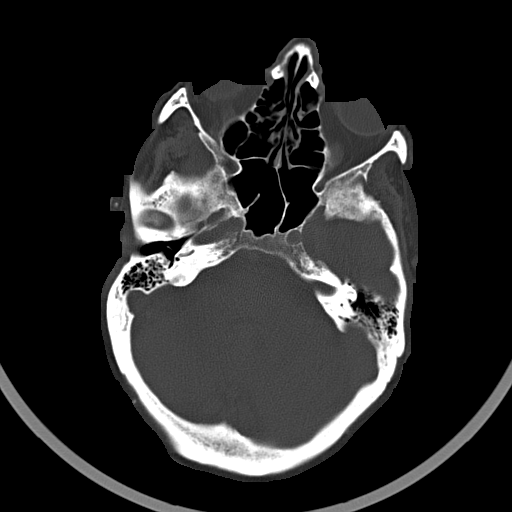
[im 20/64  bone]
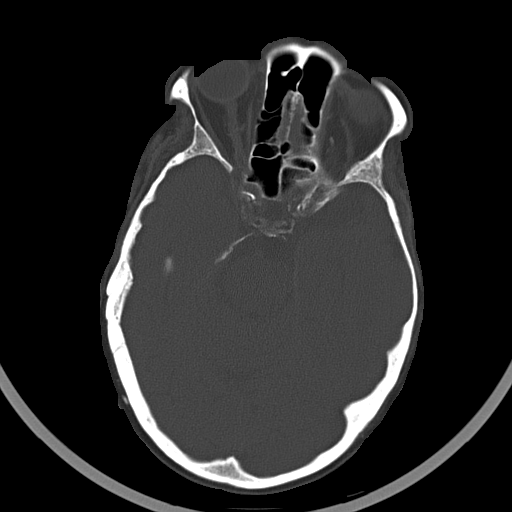
[im 27/64  bone]
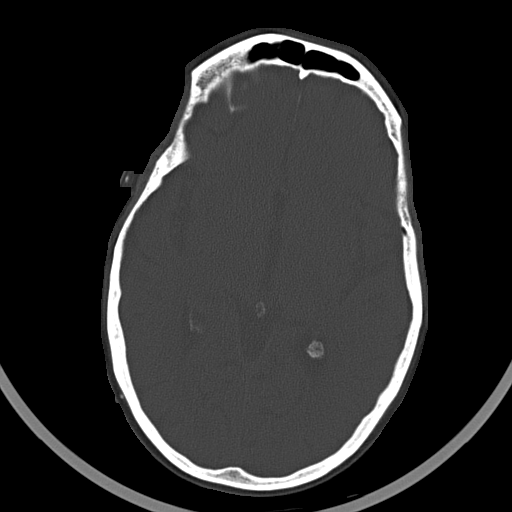
[im 37/64  bone]
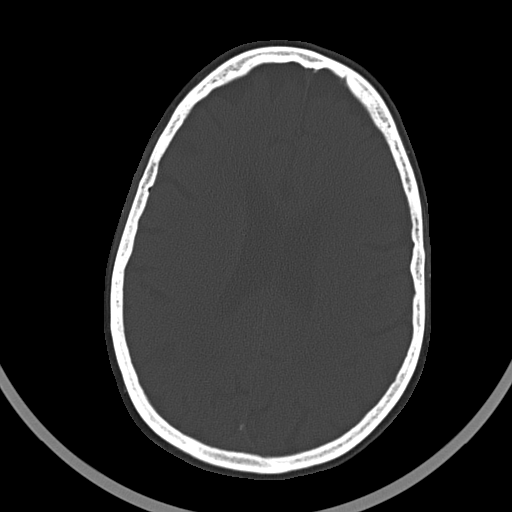
[im 44/64  bone]
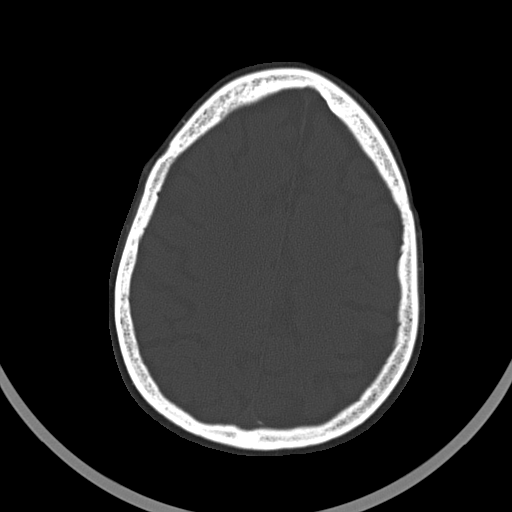
[im 50/64  bone]
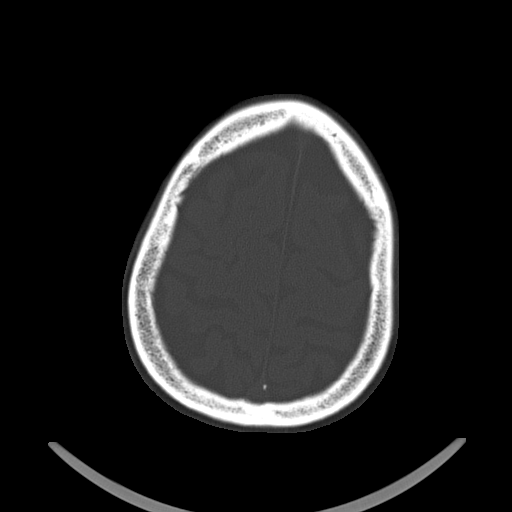
[im 57/64  bone]
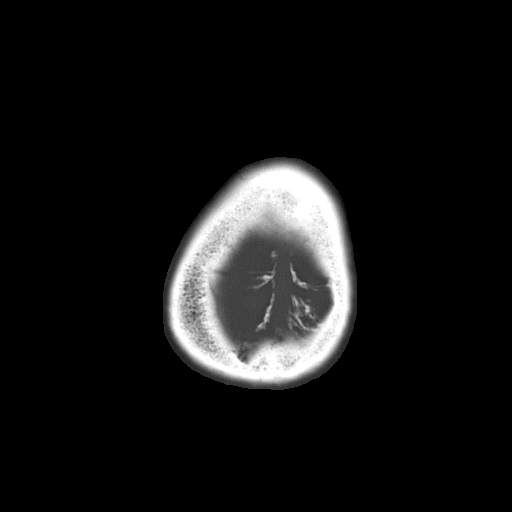

[16 of 30 positions shown; findings below may reference images not displayed]

FINDINGS: There is slight prominence of the ventricles and sulci compatible
with age-related volume loss. Mild periventricular and deep white
matter hypodensities represent chronic microvascular ischemic
changes. Small old left basal ganglia lacunar infarct noted. There
is no intracranial hemorrhage. No mass effect or midline shift
identified.

The visualized paranasal sinuses and mastoid air cells are well
aerated. The calvarium is intact.
IMPRESSION: No acute intracranial pathology.

Mild age-related atrophy and chronic microvascular ischemic disease.
